# Patient Record
Sex: Male | Born: 1935 | Race: White | Hispanic: No | Smoking: Former smoker
Health system: Southern US, Community
[De-identification: ages and names within clinical notes are randomized; demographics above are authoritative.]

## PROBLEM LIST (undated history)

## (undated) DIAGNOSIS — I739 Peripheral vascular disease, unspecified: Secondary | ICD-10-CM

## (undated) DIAGNOSIS — M1990 Unspecified osteoarthritis, unspecified site: Secondary | ICD-10-CM

## (undated) DIAGNOSIS — I10 Essential (primary) hypertension: Secondary | ICD-10-CM

## (undated) DIAGNOSIS — E875 Hyperkalemia: Secondary | ICD-10-CM

## (undated) DIAGNOSIS — E785 Hyperlipidemia, unspecified: Secondary | ICD-10-CM

## (undated) DIAGNOSIS — K219 Gastro-esophageal reflux disease without esophagitis: Secondary | ICD-10-CM

## (undated) DIAGNOSIS — I779 Disorder of arteries and arterioles, unspecified: Secondary | ICD-10-CM

## (undated) DIAGNOSIS — I252 Old myocardial infarction: Secondary | ICD-10-CM

## (undated) DIAGNOSIS — I2510 Atherosclerotic heart disease of native coronary artery without angina pectoris: Secondary | ICD-10-CM

## (undated) HISTORY — DX: Hyperkalemia: E87.5

## (undated) HISTORY — DX: Disorder of arteries and arterioles, unspecified: I77.9

## (undated) HISTORY — DX: Essential (primary) hypertension: I10

## (undated) HISTORY — DX: Hyperlipidemia, unspecified: E78.5

## (undated) HISTORY — DX: Unspecified osteoarthritis, unspecified site: M19.90

## (undated) HISTORY — DX: Old myocardial infarction: I25.2

## (undated) HISTORY — DX: Peripheral vascular disease, unspecified: I73.9

## (undated) HISTORY — DX: Atherosclerotic heart disease of native coronary artery without angina pectoris: I25.10

---

## 2003-07-31 DIAGNOSIS — I252 Old myocardial infarction: Secondary | ICD-10-CM

## 2003-07-31 DIAGNOSIS — I2510 Atherosclerotic heart disease of native coronary artery without angina pectoris: Secondary | ICD-10-CM

## 2003-07-31 HISTORY — DX: Atherosclerotic heart disease of native coronary artery without angina pectoris: I25.10

## 2003-07-31 HISTORY — DX: Old myocardial infarction: I25.2

## 2003-07-31 HISTORY — PX: CORONARY ANGIOPLASTY WITH STENT PLACEMENT: SHX49

## 2004-03-19 ENCOUNTER — Other Ambulatory Visit: Payer: Self-pay

## 2005-06-28 ENCOUNTER — Ambulatory Visit: Payer: Self-pay | Admitting: Unknown Physician Specialty

## 2011-07-05 ENCOUNTER — Ambulatory Visit: Payer: Self-pay | Admitting: General Surgery

## 2012-04-18 DIAGNOSIS — C439 Malignant melanoma of skin, unspecified: Secondary | ICD-10-CM | POA: Insufficient documentation

## 2012-04-18 DIAGNOSIS — Z955 Presence of coronary angioplasty implant and graft: Secondary | ICD-10-CM | POA: Insufficient documentation

## 2012-04-18 DIAGNOSIS — I2510 Atherosclerotic heart disease of native coronary artery without angina pectoris: Secondary | ICD-10-CM | POA: Insufficient documentation

## 2012-04-18 DIAGNOSIS — Z9861 Coronary angioplasty status: Secondary | ICD-10-CM | POA: Insufficient documentation

## 2013-05-08 ENCOUNTER — Encounter: Payer: Self-pay | Admitting: Podiatry

## 2013-05-15 ENCOUNTER — Encounter: Payer: Self-pay | Admitting: Podiatry

## 2013-05-15 ENCOUNTER — Ambulatory Visit (INDEPENDENT_AMBULATORY_CARE_PROVIDER_SITE_OTHER): Payer: Medicare Other | Admitting: Podiatry

## 2013-05-15 ENCOUNTER — Ambulatory Visit (INDEPENDENT_AMBULATORY_CARE_PROVIDER_SITE_OTHER): Payer: Medicare Other

## 2013-05-15 VITALS — BP 147/81 | HR 57 | Resp 16 | Ht 72.0 in | Wt 204.0 lb

## 2013-05-15 DIAGNOSIS — R52 Pain, unspecified: Secondary | ICD-10-CM

## 2013-05-15 DIAGNOSIS — M2040 Other hammer toe(s) (acquired), unspecified foot: Secondary | ICD-10-CM

## 2013-05-15 DIAGNOSIS — G589 Mononeuropathy, unspecified: Secondary | ICD-10-CM

## 2013-05-15 DIAGNOSIS — M779 Enthesopathy, unspecified: Secondary | ICD-10-CM

## 2013-05-15 DIAGNOSIS — M2010 Hallux valgus (acquired), unspecified foot: Secondary | ICD-10-CM

## 2013-05-15 MED ORDER — GABAPENTIN 400 MG PO CAPS: 400.0000 mg | ORAL_CAPSULE | Freq: Two times a day (BID) | ORAL | Status: DC

## 2013-05-15 NOTE — Patient Instructions (Signed)
Take one at night for 2 weeks then start 2 a day if you are tolerating well

## 2013-05-15 NOTE — Progress Notes (Signed)
Subjective:     Patient ID: Mark [name], male   DOB: [birth year], 80 y.o.   MRN: [id number]  Foot Pain   patient presents stating I have very bad feet with arthritis bunions and hammertoes and they're starting to feel numb and burning. States that this is been going on for several years and gradually becoming more bothersome  Review of Systems  All other systems reviewed and are negative.       Objective:   Physical Exam  Nursing note and vitals reviewed. Constitutional: He appears well-developed and well-nourished.  Cardiovascular: Intact distal pulses.   Musculoskeletal: Normal range of motion.  Skin: Skin is warm.   systems review  reviewed with patient. Significant forefoot structural malalignment with elevated toes left over right and big toes that fall underneath the second toe. 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 We discussed increased padding for the plantar foot and the possibility for long-term orthotics but at this point I want to control the burning and tingling that he is experiencing. I placed him on Neurontin 400 mg once a day for 2 weeks and if tolerated well twice a day for 2 week if it bothers him he will stop it immediately. Reappoint 4 weeks

## 2013-05-15 NOTE — Progress Notes (Signed)
N HURT/SWELL L B/L FEET/ANKLES D 25-30 YRS O SLOWLY C WORSE A WALKING T SEEN DR AT [facility] WAS TOLD TO TAKE TYLENOL (70YRS AGO)

## 2013-07-03 ENCOUNTER — Ambulatory Visit (INDEPENDENT_AMBULATORY_CARE_PROVIDER_SITE_OTHER): Payer: Medicare Other | Admitting: Podiatry

## 2013-07-03 ENCOUNTER — Ambulatory Visit: Payer: Medicare Other | Admitting: Podiatry

## 2013-07-03 ENCOUNTER — Encounter: Payer: Self-pay | Admitting: Podiatry

## 2013-07-03 VITALS — BP 120/66 | HR 63 | Resp 16 | Ht 72.0 in | Wt 201.0 lb

## 2013-07-03 DIAGNOSIS — M7750 Other enthesopathy of unspecified foot: Secondary | ICD-10-CM

## 2013-07-03 DIAGNOSIS — G589 Mononeuropathy, unspecified: Secondary | ICD-10-CM

## 2013-07-04 NOTE — Progress Notes (Signed)
Subjective:     Patient ID: Mark [name], male   DOB: 1929-06-08, 79 y.o.   MRN: [id number]  HPI patient states the medicine has been beneficial but I think it has caused me to get slightly dizzy at the dosage that I am taking. Still having pain in my feet when doing a lot of walking   Review of Systems     Objective:   Physical Exam Neurovascular status is intact with no other changes in health history noted. Severe structural forefoot derangement with neuropathic changes distal with diminishment of sharp testing and vibratory sensation    Assessment:     Structural damage to forefoot along with probable neuropathic condition    Plan:     Discussed reducing medication and he will only take one Neurontin at night and see whether or not this is beneficial. We may put him on a lower dosage during the day which I will decide in 2 months and also will consider orthotics for the long-term

## 2013-09-04 ENCOUNTER — Ambulatory Visit: Payer: Medicare Other | Admitting: Podiatry

## 2013-09-18 ENCOUNTER — Ambulatory Visit: Payer: Self-pay | Admitting: Family Medicine

## 2013-12-25 ENCOUNTER — Ambulatory Visit: Payer: Self-pay | Admitting: Family Medicine

## 2014-02-03 ENCOUNTER — Ambulatory Visit: Payer: Self-pay | Admitting: Orthopedic Surgery

## 2014-03-03 DIAGNOSIS — M48062 Spinal stenosis, lumbar region with neurogenic claudication: Secondary | ICD-10-CM | POA: Insufficient documentation

## 2014-03-03 DIAGNOSIS — M5136 Other intervertebral disc degeneration, lumbar region: Secondary | ICD-10-CM | POA: Insufficient documentation

## 2014-03-03 DIAGNOSIS — M5416 Radiculopathy, lumbar region: Secondary | ICD-10-CM | POA: Insufficient documentation

## 2014-03-03 DIAGNOSIS — M5116 Intervertebral disc disorders with radiculopathy, lumbar region: Secondary | ICD-10-CM | POA: Insufficient documentation

## 2014-03-18 ENCOUNTER — Ambulatory Visit: Payer: Self-pay | Admitting: Urology

## 2014-07-16 ENCOUNTER — Ambulatory Visit: Payer: Self-pay | Admitting: Family Medicine

## 2014-07-16 LAB — CLOSTRIDIUM DIFFICILE(ARMC)

## 2015-02-03 ENCOUNTER — Ambulatory Visit (INDEPENDENT_AMBULATORY_CARE_PROVIDER_SITE_OTHER): Payer: PPO | Admitting: Family Medicine

## 2015-02-03 ENCOUNTER — Encounter: Payer: Self-pay | Admitting: Family Medicine

## 2015-02-03 VITALS — BP 120/76 | HR 71 | Wt 194.0 lb

## 2015-02-03 DIAGNOSIS — M1990 Unspecified osteoarthritis, unspecified site: Secondary | ICD-10-CM | POA: Insufficient documentation

## 2015-02-03 DIAGNOSIS — R195 Other fecal abnormalities: Secondary | ICD-10-CM | POA: Insufficient documentation

## 2015-02-03 DIAGNOSIS — K921 Melena: Secondary | ICD-10-CM | POA: Insufficient documentation

## 2015-02-03 DIAGNOSIS — K5790 Diverticulosis of intestine, part unspecified, without perforation or abscess without bleeding: Secondary | ICD-10-CM | POA: Insufficient documentation

## 2015-02-03 MED ORDER — DICLOFENAC POTASSIUM 50 MG PO TABS: 50.0000 mg | ORAL_TABLET | Freq: Two times a day (BID) | ORAL | Status: DC

## 2015-02-03 NOTE — Progress Notes (Signed)
Name: Mark [name]   MRN: [id number]    DOB: Aug 01, 1928   Date:02/03/2015       Progress Note  Subjective  Chief Complaint  Chief Complaint  Patient presents with  . Arthritis     f/u    Arthritis Presents for follow-up visit. The disease course has been stable. He complains of pain. Affected locations include the right foot, right ankle, left ankle and left foot. His pain is at a severity of 5/10. Associated symptoms include diarrhea and pain at night. His past medical history is significant for osteoarthritis. Past treatments include acetaminophen and NSAIDs. The treatment provided moderate relief. Compliance with prior treatments has been good.  Black-colored Stools Patient has noticed black colored stools for the past few days. He has had watery diarrhea, nausea, and anorexia and has been taking Pepto-Bismol for the last 4-5 days.    Past Medical History  Diagnosis Date  . Arthritis   . Hypertension   . Past heart attack 2005    Past Surgical History  Procedure Laterality Date  . Coronary angioplasty with stent placement      Family History  Problem Relation Age of Onset  . Hypertension Mother   . Heart disease Mother   . Heart disease Father   . Hypertension Father     History   Social History  . Marital Status: Married    Spouse Name: N/A  . Number of Children: N/A  . Years of Education: N/A   Occupational History  . Not on file.   Social History Main Topics  . Smoking status: Former Smoker    Quit date: 05/16/1963  . Smokeless tobacco: Never Used     Comment: quit over 50 years ago  . Alcohol Use: No  . Drug Use: No  . Sexual Activity: Not on file   Other Topics Concern  . Not on file   Social History Narrative     Current outpatient prescriptions:  .  albuterol (PROVENTIL HFA) 108 (90 BASE) MCG/ACT inhaler, 1 inhalation every 4 (four) hours as needed. , Disp: , Rfl:  .  aspirin 81 MG tablet, Take 81 mg by mouth daily., Disp: , Rfl:  .   diclofenac (CATAFLAM) 50 MG tablet, Take 1 tablet (50 mg total) by mouth 2 (two) times daily., Disp: 60 tablet, Rfl: 2 .  esomeprazole (NEXIUM) 40 MG packet, Take 40 mg by mouth daily before breakfast., Disp: , Rfl:  .  fluticasone (FLONASE) 50 MCG/ACT nasal spray, Place into the nose., Disp: , Rfl:  .  losartan (COZAAR) 50 MG tablet, Take 50 mg by mouth., Disp: , Rfl:  .  metoprolol (LOPRESSOR) 50 MG tablet, Take 50 mg by mouth 2 (two) times daily., Disp: , Rfl:  .  Probiotic Product (PROBIOTIC PO), Take by mouth daily., Disp: , Rfl:  .  acetaminophen (TYLENOL) 500 MG tablet, Take 500 mg by mouth 2 (two) times daily., Disp: , Rfl:  .  aspirin EC 81 MG tablet, Take 81 mg by mouth., Disp: , Rfl:  .  gabapentin (NEURONTIN) 400 MG capsule, Take 1 capsule (400 mg total) by mouth 2 (two) times daily. (Patient not taking: Reported on 02/03/2015), Disp: 60 capsule, Rfl: 3 .  Loratadine 10 MG CAPS, Take by mouth., Disp: , Rfl:  .  Omega-3 Fatty Acids (FISH OIL) 1000 MG CAPS, Take by mouth daily., Disp: , Rfl:   No Known Allergies   Review of Systems  Gastrointestinal: Positive for diarrhea.  Musculoskeletal:  Positive for back pain, joint pain and arthritis.      Objective  Filed Vitals:   02/03/15 1444  BP: 120/76  Pulse: 71  Weight: 194 lb (87.998 kg)  SpO2: 96%    Physical Exam  Constitutional: He is well-developed, well-nourished, and in no distress.  HENT:  Head: Normocephalic and atraumatic.  Cardiovascular: Normal rate and regular rhythm.   Pulmonary/Chest: Effort normal.  Nursing note and vitals reviewed.    Assessment & Plan  1. Arthritis Patient has been taken off Misoprostol by gastroenterology. Misoprostol was prescribed for GI prophylaxis due to NSAID therapy. Symptoms of arthritis are well-controlled on diclofenac 50 mg twice a day. He has apparently been prescribed Tylenol No. 3 by a different provider. He will call our office regarding the dosage instructions for  Tylenol 3.  - diclofenac (CATAFLAM) 50 MG tablet; Take 1 tablet (50 mg total) by mouth 2 (two) times daily.  Dispense: 60 tablet; Refill: 2  2. Dark stools Patient has been asked to contact his gastroenterologist Dr. Vira Agar to discuss his symptoms of black colored stools. DDX includes UGIB versus medication side effect vs idiopathic. Patient verbalized understanding.  Analya Louissaint Asad A. [facility] 02/03/2015 3:31 PM

## 2015-02-04 ENCOUNTER — Telehealth: Payer: Self-pay | Admitting: Family Medicine

## 2015-02-04 NOTE — Telephone Encounter (Signed)
Pt called in to let provider know that his Gabepentin 400mg  was prescribed by Dr. Paulla Dolly.

## 2015-02-09 ENCOUNTER — Ambulatory Visit: Payer: Self-pay | Admitting: Family Medicine

## 2015-03-29 ENCOUNTER — Encounter: Payer: Self-pay | Admitting: Family Medicine

## 2015-03-29 ENCOUNTER — Ambulatory Visit (INDEPENDENT_AMBULATORY_CARE_PROVIDER_SITE_OTHER): Payer: PPO | Admitting: Family Medicine

## 2015-03-29 VITALS — BP 120/75 | HR 84 | Temp 97.8°F | Resp 19 | Ht 69.0 in | Wt 197.9 lb

## 2015-03-29 DIAGNOSIS — E785 Hyperlipidemia, unspecified: Secondary | ICD-10-CM | POA: Diagnosis not present

## 2015-03-29 DIAGNOSIS — Z833 Family history of diabetes mellitus: Secondary | ICD-10-CM | POA: Diagnosis not present

## 2015-03-29 DIAGNOSIS — Z23 Encounter for immunization: Secondary | ICD-10-CM

## 2015-03-29 DIAGNOSIS — E875 Hyperkalemia: Secondary | ICD-10-CM | POA: Diagnosis not present

## 2015-03-29 DIAGNOSIS — M1990 Unspecified osteoarthritis, unspecified site: Secondary | ICD-10-CM | POA: Diagnosis not present

## 2015-03-29 MED ORDER — MELOXICAM 7.5 MG PO TABS
7.5000 mg | ORAL_TABLET | Freq: Every day | ORAL | Status: DC
Start: 2015-03-29 — End: 2015-05-30

## 2015-03-29 NOTE — Progress Notes (Signed)
Name: Mark [name]   MRN: [id number]    DOB: 1929-06-07   Date:03/29/2015       Progress Note  Subjective  Chief Complaint  Chief Complaint  Patient presents with  . Follow-up    6 wk. lab work    Hyperlipidemia This is a chronic problem. The problem is controlled. Pertinent negatives include no myalgias. He is currently on no antihyperlipidemic treatment (Has been on Crestor in the past.).  Arthritis Presents for follow-up visit. He complains of pain, stiffness and joint swelling. Affected locations include the right hip, left foot and right foot. His past medical history is significant for chronic back pain and osteoarthritis. Past treatments include NSAIDs (Pt. has been taking Diclofenac and wants to switch to Meloxicam for arthritis.).    Past Medical History  Diagnosis Date  . Arthritis   . Hypertension   . Past heart attack 2005    Past Surgical History  Procedure Laterality Date  . Coronary angioplasty with stent placement      Family History  Problem Relation Age of Onset  . Hypertension Mother   . Heart disease Mother   . Heart disease Father   . Hypertension Father     Social History   Social History  . Marital Status: Married    Spouse Name: N/A  . Number of Children: N/A  . Years of Education: N/A   Occupational History  . Not on file.   Social History Main Topics  . Smoking status: Former Smoker    Quit date: 05/16/1963  . Smokeless tobacco: Never Used     Comment: quit over 50 years ago  . Alcohol Use: No  . Drug Use: No  . Sexual Activity: Not on file   Other Topics Concern  . Not on file   Social History Narrative     Current outpatient prescriptions:  .  acetaminophen (TYLENOL) 500 MG tablet, Take 500 mg by mouth 2 (two) times daily., Disp: , Rfl:  .  albuterol (PROVENTIL HFA) 108 (90 BASE) MCG/ACT inhaler, 1 inhalation every 4 (four) hours as needed. , Disp: , Rfl:  .  aspirin EC 81 MG tablet, Take 81 mg by mouth., Disp: , Rfl:   .  diclofenac (CATAFLAM) 50 MG tablet, Take 1 tablet (50 mg total) by mouth 2 (two) times daily., Disp: 60 tablet, Rfl: 2 .  esomeprazole (NEXIUM) 40 MG packet, Take 40 mg by mouth daily before breakfast., Disp: , Rfl:  .  fluticasone (FLONASE) 50 MCG/ACT nasal spray, Place into the nose., Disp: , Rfl:  .  gabapentin (NEURONTIN) 400 MG capsule, Take 1 capsule (400 mg total) by mouth 2 (two) times daily., Disp: 60 capsule, Rfl: 3 .  Loratadine 10 MG CAPS, Take by mouth., Disp: , Rfl:  .  losartan (COZAAR) 50 MG tablet, Take 50 mg by mouth., Disp: , Rfl:  .  metoprolol (LOPRESSOR) 50 MG tablet, Take 50 mg by mouth 2 (two) times daily., Disp: , Rfl:  .  Probiotic Product (PROBIOTIC PO), Take by mouth daily., Disp: , Rfl:   Allergies  Allergen Reactions  . Rosuvastatin Rash    No reaction per pt, He saw some information regarding Crestor and adverse effects No reaction per pt, He saw some information regarding Crestor and adverse effects     Review of Systems  Musculoskeletal: Positive for back pain, joint pain, joint swelling, arthritis and stiffness. Negative for myalgias.      Objective  Filed Vitals:  03/29/15 1010  BP: 120/75  Pulse: 84  Temp: 97.8 F (36.6 C)  TempSrc: Oral  Resp: 19  Height: 5\' 9"  (1.753 m)  Weight: 197 lb 14.4 oz (89.767 kg)  SpO2: 95%    Physical Exam  Constitutional: He is oriented to person, place, and time and well-developed, well-nourished, and in no distress.  HENT:  Head: Normocephalic and atraumatic.  Cardiovascular: Normal rate.   Murmur heard. Pulmonary/Chest: Effort normal and breath sounds normal.  Musculoskeletal:       Right ankle: He exhibits swelling.       Left ankle: He exhibits swelling.  Neurological: He is alert and oriented to person, place, and time.  Nursing note and vitals reviewed.   Assessment & Plan  1. Hyperlipidemia  - Lipid Profile - Comprehensive metabolic panel  2. Family history of type 2 diabetes  mellitus in father  - HgB A1c  3. Arthritis  DC diclofenac and start meloxicam 7.5 mg daily. Patient asked to continue taking misoprostol for GI protection. Follow-up in one month. - meloxicam (MOBIC) 7.5 MG tablet; Take 1 tablet (7.5 mg total) by mouth daily.  Dispense: 30 tablet; Refill: 0     Akayla Brass Asad A. [facility] 03/29/2015 10:38 AM

## 2015-03-30 LAB — LIPID PANEL
CHOLESTEROL TOTAL: 212 mg/dL — AB (ref 100–199)
Chol/HDL Ratio: 6.6 ratio units — ABNORMAL HIGH (ref 0.0–5.0)
HDL: 32 mg/dL — ABNORMAL LOW (ref >39)
LDL Calculated: 158 mg/dL — ABNORMAL HIGH (ref 0–99)
Triglycerides: 108 mg/dL (ref 0–149)
VLDL CHOLESTEROL CAL: 22 mg/dL (ref 5–40)

## 2015-03-30 LAB — COMPREHENSIVE METABOLIC PANEL
ALBUMIN: 4 g/dL (ref 3.5–4.7)
ALK PHOS: 91 IU/L (ref 39–117)
ALT: 13 IU/L (ref 0–44)
AST: 18 IU/L (ref 0–40)
Albumin/Globulin Ratio: 1.5 (ref 1.1–2.5)
BUN / CREAT RATIO: 16 (ref 10–22)
BUN: 15 mg/dL (ref 8–27)
Bilirubin Total: 0.5 mg/dL (ref 0.0–1.2)
CALCIUM: 9.1 mg/dL (ref 8.6–10.2)
CO2: 21 mmol/L (ref 18–29)
CREATININE: 0.93 mg/dL (ref 0.76–1.27)
Chloride: 103 mmol/L (ref 97–108)
GFR calc Af Amer: 87 mL/min/{1.73_m2} (ref >59)
GFR, EST NON AFRICAN AMERICAN: 75 mL/min/{1.73_m2} (ref >59)
GLOBULIN, TOTAL: 2.7 g/dL (ref 1.5–4.5)
GLUCOSE: 96 mg/dL (ref 65–99)
Potassium: 5.5 mmol/L — ABNORMAL HIGH (ref 3.5–5.2)
SODIUM: 141 mmol/L (ref 134–144)
TOTAL PROTEIN: 6.7 g/dL (ref 6.0–8.5)

## 2015-03-30 LAB — HEMOGLOBIN A1C
ESTIMATED AVERAGE GLUCOSE: 131 mg/dL
HEMOGLOBIN A1C: 6.2 % — AB (ref 4.8–5.6)

## 2015-04-01 DIAGNOSIS — E875 Hyperkalemia: Secondary | ICD-10-CM | POA: Insufficient documentation

## 2015-04-01 NOTE — Addendum Note (Signed)
Addended byManuella Ghazi, Nakul Avino A A on: 04/01/2015 05:54 PM   Modules accepted: Orders

## 2015-04-29 ENCOUNTER — Encounter: Payer: Self-pay | Admitting: Family Medicine

## 2015-04-29 ENCOUNTER — Ambulatory Visit (INDEPENDENT_AMBULATORY_CARE_PROVIDER_SITE_OTHER): Payer: PPO | Admitting: Family Medicine

## 2015-04-29 VITALS — BP 120/74 | HR 83 | Temp 97.6°F | Resp 18 | Ht 69.0 in | Wt 196.1 lb

## 2015-04-29 DIAGNOSIS — M7989 Other specified soft tissue disorders: Secondary | ICD-10-CM | POA: Diagnosis not present

## 2015-04-29 DIAGNOSIS — E785 Hyperlipidemia, unspecified: Secondary | ICD-10-CM | POA: Diagnosis not present

## 2015-04-29 MED ORDER — FUROSEMIDE 20 MG PO TABS: 20.0000 mg | ORAL_TABLET | Freq: Every day | ORAL | Status: DC

## 2015-04-29 MED ORDER — ROSUVASTATIN CALCIUM 5 MG PO TABS: 5.0000 mg | ORAL_TABLET | Freq: Every day | ORAL | Status: DC

## 2015-04-29 NOTE — Progress Notes (Signed)
Name: Mark [name]   MRN: [id number]    DOB: 1929-01-13   Date:04/29/2015       Progress Note  Subjective  Chief Complaint  Chief Complaint  Patient presents with  . Follow-up    1 mo  . Hyperlipidemia    Hyperlipidemia This is a chronic problem. The problem is controlled. Recent lipid tests were reviewed and are high. Pertinent negatives include no chest pain, leg pain or shortness of breath. He is currently on no antihyperlipidemic treatment (Patient has been on Crestor in the past).   Leg Swelling Patient presents with persistent right-sided leg swelling.no fevers, chills, or shortness of breath reported  Past Medical History  Diagnosis Date  . Arthritis   . Hypertension   . Past heart attack 2005    Past Surgical History  Procedure Laterality Date  . Coronary angioplasty with stent placement      Family History  Problem Relation Age of Onset  . Hypertension Mother   . Heart disease Mother   . Heart disease Father   . Hypertension Father     Social History   Social History  . Marital Status: Married    Spouse Name: N/A  . Number of Children: N/A  . Years of Education: N/A   Occupational History  . Not on file.   Social History Main Topics  . Smoking status: Former Smoker    Quit date: 05/16/1963  . Smokeless tobacco: Never Used     Comment: quit over 50 years ago  . Alcohol Use: No  . Drug Use: No  . Sexual Activity: Not on file   Other Topics Concern  . Not on file   Social History Narrative    Current outpatient prescriptions:  .  albuterol (PROVENTIL HFA) 108 (90 BASE) MCG/ACT inhaler, 1 inhalation every 4 (four) hours as needed. , Disp: , Rfl:  .  aspirin EC 81 MG tablet, Take 81 mg by mouth., Disp: , Rfl:  .  diclofenac (VOLTAREN) 50 MG EC tablet, Take 50 mg by mouth 2 (two) times daily., Disp: , Rfl:  .  esomeprazole (NEXIUM) 40 MG packet, Take 40 mg by mouth daily before breakfast., Disp: , Rfl:  .  fluticasone (FLONASE) 50 MCG/ACT  nasal spray, Place into the nose., Disp: , Rfl:  .  gabapentin (NEURONTIN) 400 MG capsule, Take 1 capsule (400 mg total) by mouth 2 (two) times daily., Disp: 60 capsule, Rfl: 3 .  losartan (COZAAR) 50 MG tablet, Take 50 mg by mouth., Disp: , Rfl:  .  metoprolol (LOPRESSOR) 50 MG tablet, Take 50 mg by mouth 2 (two) times daily., Disp: , Rfl:  .  Probiotic Product (PROBIOTIC PO), Take by mouth daily., Disp: , Rfl:  .  acetaminophen (TYLENOL) 500 MG tablet, Take 500 mg by mouth 2 (two) times daily., Disp: , Rfl:  .  Loratadine 10 MG CAPS, Take by mouth., Disp: , Rfl:  .  meloxicam (MOBIC) 7.5 MG tablet, Take 1 tablet (7.5 mg total) by mouth daily. (Patient not taking: Reported on 04/29/2015), Disp: 30 tablet, Rfl: 0  Allergies  Allergen Reactions  . Rosuvastatin Rash and Other (See Comments)    No reaction per pt, He saw some information regarding Crestor and adverse effects No reaction per pt, He saw some information regarding Crestor and adverse effects No reaction per pt, He saw some information regarding Crestor and adverse effects    Review of Systems  Constitutional: Negative for fever and chills.  Respiratory:  Negative for shortness of breath.   Cardiovascular: Positive for leg swelling. Negative for chest pain.   Objective  Filed Vitals:   04/29/15 1110  BP: 120/74  Pulse: 83  Temp: 97.6 F (36.4 C)  TempSrc: Oral  Resp: 18  Height: 5\' 9"  (1.753 m)  Weight: 196 lb 1.6 oz (88.95 kg)  SpO2: 96%    Physical Exam  Constitutional: He is well-developed, well-nourished, and in no distress.  HENT:  Head: Normocephalic and atraumatic.  Cardiovascular: Normal rate and regular rhythm.   Pulmonary/Chest: Effort normal and breath sounds normal.  Musculoskeletal:       Right ankle: He exhibits swelling.       Left ankle: He exhibits swelling.  R > L lower extremity swelling  Nursing note and vitals reviewed.  Assessment & Plan  1. Leg swelling  Started patient on Lasix 20 mg  3 for lower extremity swelling. - furosemide (LASIX) 20 MG tablet; Take 1 tablet (20 mg total) by mouth daily.  Dispense: 30 tablet; Refill: 3  2. Hyperlipidemia Started patient on Crestor 5 mg at bedtime for hyperlipidemia. Recheck FLP in 3 months. - rosuvastatin (CRESTOR) 5 MG tablet; Take 1 tablet (5 mg total) by mouth daily.  Dispense: 90 tablet; Refill: 3   Syed Asad A. [facility] 04/29/2015 11:47 AM

## 2015-05-30 ENCOUNTER — Encounter: Payer: Self-pay | Admitting: Family Medicine

## 2015-05-30 ENCOUNTER — Ambulatory Visit (INDEPENDENT_AMBULATORY_CARE_PROVIDER_SITE_OTHER): Payer: PPO | Admitting: Family Medicine

## 2015-05-30 VITALS — BP 124/68 | HR 70 | Temp 97.9°F | Resp 16 | Ht 69.0 in | Wt 199.7 lb

## 2015-05-30 DIAGNOSIS — E875 Hyperkalemia: Secondary | ICD-10-CM

## 2015-05-30 DIAGNOSIS — M7989 Other specified soft tissue disorders: Secondary | ICD-10-CM | POA: Insufficient documentation

## 2015-05-30 DIAGNOSIS — I890 Lymphedema, not elsewhere classified: Secondary | ICD-10-CM | POA: Insufficient documentation

## 2015-05-30 MED ORDER — FUROSEMIDE 20 MG PO TABS: 20.0000 mg | ORAL_TABLET | Freq: Two times a day (BID) | ORAL | Status: DC

## 2015-05-30 NOTE — Progress Notes (Signed)
Name: Mark [name]   MRN: [id number]    DOB: [birth year]   Date:05/30/2015       Progress Note  Subjective  Chief Complaint  Chief Complaint  Patient presents with  . Hypertension    1 month recheck  . Hyperlipidemia    HPI  Leg Swelling Pt. Is here for bilateral leg swelling, started on Lasix 20 mg daily at last office visit. He reports swelling is still present but is improved. No shortness of breath, chest pain or other concerning symptoms.  Past Medical History  Diagnosis Date  . Arthritis   . Hypertension   . Past heart attack 2005    Past Surgical History  Procedure Laterality Date  . Coronary angioplasty with stent placement      Family History  Problem Relation Age of Onset  . Hypertension Mother   . Heart disease Mother   . Heart disease Father   . Hypertension Father     Social History   Social History  . Marital Status: Married    Spouse Name: N/A  . Number of Children: N/A  . Years of Education: N/A   Occupational History  . Not on file.   Social History Main Topics  . Smoking status: Former Smoker    Quit date: 05/16/1963  . Smokeless tobacco: Never Used     Comment: quit over 50 years ago  . Alcohol Use: No  . Drug Use: No  . Sexual Activity: Not on file   Other Topics Concern  . Not on file   Social History Narrative     Current outpatient prescriptions:  .  acetaminophen (TYLENOL) 500 MG tablet, Take 500 mg by mouth 2 (two) times daily., Disp: , Rfl:  .  albuterol (PROVENTIL HFA) 108 (90 BASE) MCG/ACT inhaler, 1 inhalation every 4 (four) hours as needed. , Disp: , Rfl:  .  aspirin EC 81 MG tablet, Take 81 mg by mouth., Disp: , Rfl:  .  diclofenac (VOLTAREN) 50 MG EC tablet, Take 50 mg by mouth 2 (two) times daily., Disp: , Rfl:  .  esomeprazole (NEXIUM) 40 MG packet, Take 40 mg by mouth daily before breakfast., Disp: , Rfl:  .  fluticasone (FLONASE) 50 MCG/ACT nasal spray, Place into the nose., Disp: , Rfl:  .  furosemide  (LASIX) 20 MG tablet, Take 1 tablet (20 mg total) by mouth daily., Disp: 30 tablet, Rfl: 3 .  gabapentin (NEURONTIN) 400 MG capsule, Take 1 capsule (400 mg total) by mouth 2 (two) times daily., Disp: 60 capsule, Rfl: 3 .  Loratadine 10 MG CAPS, Take by mouth., Disp: , Rfl:  .  losartan (COZAAR) 50 MG tablet, Take 50 mg by mouth., Disp: , Rfl:  .  metoprolol (LOPRESSOR) 50 MG tablet, Take 50 mg by mouth 2 (two) times daily., Disp: , Rfl:  .  Probiotic Product (PROBIOTIC PO), Take by mouth daily., Disp: , Rfl:  .  rosuvastatin (CRESTOR) 5 MG tablet, Take 1 tablet (5 mg total) by mouth daily., Disp: 90 tablet, Rfl: 3  Allergies  Allergen Reactions  . Rosuvastatin Rash and Other (See Comments)    No reaction per pt, He saw some information regarding Crestor and adverse effects No reaction per pt, He saw some information regarding Crestor and adverse effects No reaction per pt, He saw some information regarding Crestor and adverse effects    Review of Systems  Constitutional: Negative for fever and chills.  Respiratory: Negative for cough, sputum production and  shortness of breath.   Cardiovascular: Positive for leg swelling. Negative for chest pain.  Gastrointestinal: Negative for nausea, vomiting and abdominal pain.    Objective  Filed Vitals:   05/30/15 1013  BP: 124/68  Pulse: 70  Temp: 97.9 F (36.6 C)  TempSrc: Oral  Resp: 16  Height: 5\' 9"  (1.753 m)  Weight: 199 lb 11.2 oz (90.583 kg)  SpO2: 97%    Physical Exam  Constitutional: He is oriented to person, place, and time and well-developed, well-nourished, and in no distress.  HENT:  Head: Normocephalic and atraumatic.  Cardiovascular: Normal rate, regular rhythm and normal heart sounds.   Pulmonary/Chest: Effort normal and breath sounds normal. He has no wheezes. He has no rales.  Abdominal: Soft. Bowel sounds are normal.  Musculoskeletal: Normal range of motion. He exhibits edema (1 + pitting edema lower extremities.).   Neurological: He is alert and oriented to person, place, and time.  Skin: Skin is warm and dry.  Nursing note and vitals reviewed.  Assessment & Plan  1. Hyperkalemia  - Comprehensive Metabolic Panel (CMET)  2. Leg swelling We will increase Lasix to twice a day. Encouraged to elevate his legs. Follow-up in one month. - furosemide (LASIX) 20 MG tablet; Take 1 tablet (20 mg total) by mouth 2 (two) times daily.  Dispense: 60 tablet; Refill: 2   Carlisle Enke Asad A. [facility] 05/30/2015 10:33 AM

## 2015-05-31 LAB — COMPREHENSIVE METABOLIC PANEL
ALK PHOS: 90 IU/L (ref 39–117)
ALT: 10 IU/L (ref 0–44)
AST: 16 IU/L (ref 0–40)
Albumin/Globulin Ratio: 1.5 (ref 1.1–2.5)
Albumin: 3.9 g/dL (ref 3.5–4.7)
BUN/Creatinine Ratio: 16 (ref 10–22)
BUN: 16 mg/dL (ref 8–27)
Bilirubin Total: 0.3 mg/dL (ref 0.0–1.2)
CO2: 27 mmol/L (ref 18–29)
CREATININE: 1 mg/dL (ref 0.76–1.27)
Calcium: 9 mg/dL (ref 8.6–10.2)
Chloride: 101 mmol/L (ref 97–106)
GFR calc Af Amer: 80 mL/min/{1.73_m2} (ref >59)
GFR calc non Af Amer: 69 mL/min/{1.73_m2} (ref >59)
GLOBULIN, TOTAL: 2.6 g/dL (ref 1.5–4.5)
Glucose: 96 mg/dL (ref 65–99)
POTASSIUM: 5.5 mmol/L — AB (ref 3.5–5.2)
Sodium: 139 mmol/L (ref 136–144)
Total Protein: 6.5 g/dL (ref 6.0–8.5)

## 2015-06-01 ENCOUNTER — Telehealth: Payer: Self-pay | Admitting: Emergency Medicine

## 2015-06-01 NOTE — Telephone Encounter (Signed)
Patient notified

## 2015-06-09 ENCOUNTER — Encounter: Payer: Self-pay | Admitting: General Surgery

## 2015-06-16 ENCOUNTER — Ambulatory Visit: Payer: PPO

## 2015-06-16 ENCOUNTER — Encounter: Payer: Self-pay | Admitting: General Surgery

## 2015-06-16 ENCOUNTER — Ambulatory Visit (INDEPENDENT_AMBULATORY_CARE_PROVIDER_SITE_OTHER): Payer: PPO | Admitting: General Surgery

## 2015-06-16 VITALS — BP 122/62 | HR 70 | Resp 14 | Ht 67.0 in | Wt 198.0 lb

## 2015-06-16 DIAGNOSIS — I6529 Occlusion and stenosis of unspecified carotid artery: Secondary | ICD-10-CM

## 2015-06-16 DIAGNOSIS — I6521 Occlusion and stenosis of right carotid artery: Secondary | ICD-10-CM

## 2015-06-16 NOTE — Patient Instructions (Addendum)
CTA of carotids scheduled at [facility] for 07-06-15 at 11:30 am. Please arrive at 11:15 am to check-in. Also, you will need to be on a liquid diet for 4 hours prior to test.   Follow to be announced based upon results.

## 2015-06-16 NOTE — Progress Notes (Signed)
Patient ID: Mark [name], male   DOB: [birth year], 80 y.o.   MRN: [id number]  Chief Complaint  Patient presents with  . Other    carotid ultrasound    HPI Mark [name] is a 81 y.o. male here today for a carotid ultrasound. He states he is doing well. Denies any dizziness or frequent headaches. He has known plaquing in right carotid artery-asymptomatic. Last seen in 2012. I have reviewed the history of present illness with the patient.  HPI  Past Medical History  Diagnosis Date  . Arthritis   . Hypertension   . Past heart attack 2005    Past Surgical History  Procedure Laterality Date  . Coronary angioplasty with stent placement      Family History  Problem Relation Age of Onset  . Hypertension Mother   . Heart disease Mother   . Heart disease Father   . Hypertension Father     Social History Social History  Substance Use Topics  . Smoking status: Former Smoker    Quit date: 05/16/1963  . Smokeless tobacco: Never Used     Comment: quit over 50 years ago  . Alcohol Use: No    No Known Allergies  Current Outpatient Prescriptions  Medication Sig Dispense Refill  . albuterol (PROVENTIL HFA) 108 (90 BASE) MCG/ACT inhaler 1 inhalation every 4 (four) hours as needed.     Marland Kitchen aspirin EC 81 MG tablet Take 81 mg by mouth.    . diclofenac (VOLTAREN) 50 MG EC tablet Take 50 mg by mouth 2 (two) times daily.    Marland Kitchen esomeprazole (NEXIUM) 40 MG packet Take 40 mg by mouth daily before breakfast.    . fluticasone (FLONASE) 50 MCG/ACT nasal spray Place into the nose as needed.     . furosemide (LASIX) 20 MG tablet Take 1 tablet (20 mg total) by mouth 2 (two) times daily. 60 tablet 2  . gabapentin (NEURONTIN) 400 MG capsule Take 1 capsule (400 mg total) by mouth 2 (two) times daily. 60 capsule 3  . Loratadine 10 MG CAPS Take by mouth.    . metoprolol (LOPRESSOR) 50 MG tablet Take 50 mg by mouth 2 (two) times daily.    . Probiotic Product (PROBIOTIC PO) Take by mouth daily.    .  rosuvastatin (CRESTOR) 5 MG tablet Take 1 tablet (5 mg total) by mouth daily. 90 tablet 3   No current facility-administered medications for this visit.    Review of Systems Review of Systems  Constitutional: Negative.   Respiratory: Negative.   Cardiovascular: Negative.   Neurological: Negative for dizziness and light-headedness.    Blood pressure 122/62, pulse 70, resp. rate 14, height 5\' 7"  (1.702 m), weight 198 lb (89.812 kg).  Physical Exam Physical Exam  Constitutional: He is oriented to person, place, and time. He appears well-developed and well-nourished.  Eyes: Conjunctivae are normal. No scleral icterus.  Neurological: He is alert and oriented to person, place, and time.  Skin: Skin is warm and dry.  Psychiatric: His behavior is normal.    Data Reviewed Progress notes reviewed Carotid ultrasound performed today showing moderate to severe stenosis right side Assessment    Carotid stenosis, significant change seen on right side since prior exam.     Plan    Schedule carotid CT angiogram, increase daily ASA to 325 mg daily. Follow up after imaging completed.  Patient has been scheduled for CTA carotids at [facility] for 07-06-15 at 11:30 am (arrive 11:15 am).  Prep: liquid diet for 4 hours prior to the test.     PCP:  Ledell Peoples G 06/16/2015, 11:28 AM

## 2015-06-20 ENCOUNTER — Telehealth: Payer: Self-pay | Admitting: Family Medicine

## 2015-06-20 DIAGNOSIS — G589 Mononeuropathy, unspecified: Secondary | ICD-10-CM

## 2015-06-20 MED ORDER — GABAPENTIN 400 MG PO CAPS: 400.0000 mg | ORAL_CAPSULE | Freq: Two times a day (BID) | ORAL | Status: DC

## 2015-06-20 MED ORDER — DICLOFENAC SODIUM 50 MG PO TBEC: 50.0000 mg | DELAYED_RELEASE_TABLET | Freq: Two times a day (BID) | ORAL | Status: DC

## 2015-06-20 MED ORDER — METOPROLOL TARTRATE 50 MG PO TABS: 50.0000 mg | ORAL_TABLET | Freq: Two times a day (BID) | ORAL | Status: DC

## 2015-06-20 NOTE — Telephone Encounter (Signed)
Medication has been refilled and sent to [facility]

## 2015-06-20 NOTE — Telephone Encounter (Signed)
Leaving for [place] in the morning and is requesting refills on Metoprolol, diclofenac, and gabepentin. Please send to [facility]

## 2015-06-30 ENCOUNTER — Ambulatory Visit: Payer: PPO | Admitting: Family Medicine

## 2015-07-06 ENCOUNTER — Encounter: Payer: Self-pay | Admitting: Family Medicine

## 2015-07-06 ENCOUNTER — Ambulatory Visit
Admission: RE | Admit: 2015-07-06 | Discharge: 2015-07-06 | Disposition: A | Discharge status: Home / Self Care | Payer: PPO | Source: Ambulatory Visit | Attending: General Surgery | Admitting: General Surgery

## 2015-07-06 ENCOUNTER — Ambulatory Visit (INDEPENDENT_AMBULATORY_CARE_PROVIDER_SITE_OTHER): Payer: PPO | Admitting: Family Medicine

## 2015-07-06 VITALS — BP 122/74 | HR 60 | Temp 97.6°F | Resp 17 | Ht 67.0 in | Wt 201.6 lb

## 2015-07-06 DIAGNOSIS — M150 Primary generalized (osteo)arthritis: Secondary | ICD-10-CM | POA: Diagnosis not present

## 2015-07-06 DIAGNOSIS — M1990 Unspecified osteoarthritis, unspecified site: Secondary | ICD-10-CM | POA: Insufficient documentation

## 2015-07-06 DIAGNOSIS — I6521 Occlusion and stenosis of right carotid artery: Secondary | ICD-10-CM | POA: Insufficient documentation

## 2015-07-06 DIAGNOSIS — E785 Hyperlipidemia, unspecified: Secondary | ICD-10-CM

## 2015-07-06 DIAGNOSIS — M159 Polyosteoarthritis, unspecified: Secondary | ICD-10-CM

## 2015-07-06 MED ORDER — IOHEXOL 350 MG/ML SOLN
80.0000 mL | Freq: Once | INTRAVENOUS | Status: AC | PRN
  Administered 2015-07-06: 80 mL via INTRAVENOUS

## 2015-07-06 MED ORDER — ACETAMINOPHEN-CODEINE #3 300-30 MG PO TABS: 1.0000 | ORAL_TABLET | Freq: Two times a day (BID) | ORAL | Status: DC | PRN

## 2015-07-06 NOTE — Progress Notes (Signed)
Name: Mark [name]   MRN: [id number]    DOB: 01-17-1929   Date:07/06/2015       Progress Note  Subjective  Chief Complaint  Chief Complaint  Patient presents with  . Follow-up    1 mo. cholesterol  . Hyperlipidemia   Hyperlipidemia This is a chronic problem. The problem is uncontrolled. Recent lipid tests were reviewed and are high. Pertinent negatives include no chest pain, leg pain, myalgias or shortness of breath. Current antihyperlipidemic treatment includes statins. There are no compliance problems.  Risk factors for coronary artery disease include dyslipidemia.  Arthritis Presents for follow-up visit. The disease course has been stable. He complains of pain and joint warmth. Affected locations include the right shoulder, left ankle, right ankle and right wrist. Pertinent negatives include no fever. Past treatments include acetaminophen, an opioid and NSAIDs. Compliance with prior treatments has been good.  Has taken Tylenol with codeine and has had good relief of pain in all his joints. Requesting renewal of Tylenol 3.  Past Medical History  Diagnosis Date  . Arthritis   . Hypertension   . Past heart attack 2005    Past Surgical History  Procedure Laterality Date  . Coronary angioplasty with stent placement      Family History  Problem Relation Age of Onset  . Hypertension Mother   . Heart disease Mother   . Heart disease Father   . Hypertension Father     Social History   Social History  . Marital Status: Married    Spouse Name: N/A  . Number of Children: N/A  . Years of Education: N/A   Occupational History  . Not on file.   Social History Main Topics  . Smoking status: Former Smoker    Quit date: 05/16/1963  . Smokeless tobacco: Never Used     Comment: quit over 50 years ago  . Alcohol Use: No  . Drug Use: No  . Sexual Activity: Not on file   Other Topics Concern  . Not on file   Social History Narrative     Current outpatient prescriptions:   .  acetaminophen-codeine (TYLENOL #3) 300-30 MG tablet, Take by mouth every 12 (twelve) hours as needed for moderate pain., Disp: , Rfl:  .  albuterol (PROVENTIL HFA) 108 (90 BASE) MCG/ACT inhaler, 1 inhalation every 4 (four) hours as needed. , Disp: , Rfl:  .  diclofenac (VOLTAREN) 50 MG EC tablet, Take 1 tablet (50 mg total) by mouth 2 (two) times daily., Disp: 60 tablet, Rfl: 0 .  esomeprazole (NEXIUM) 40 MG packet, Take 40 mg by mouth daily before breakfast., Disp: , Rfl:  .  fluticasone (FLONASE) 50 MCG/ACT nasal spray, Place into the nose as needed. , Disp: , Rfl:  .  furosemide (LASIX) 20 MG tablet, Take 1 tablet (20 mg total) by mouth 2 (two) times daily., Disp: 60 tablet, Rfl: 2 .  gabapentin (NEURONTIN) 400 MG capsule, Take 1 capsule (400 mg total) by mouth 2 (two) times daily., Disp: 60 capsule, Rfl: 3 .  Loratadine 10 MG CAPS, Take by mouth., Disp: , Rfl:  .  metoprolol (LOPRESSOR) 50 MG tablet, Take 1 tablet (50 mg total) by mouth 2 (two) times daily., Disp: 60 tablet, Rfl: 0 .  Probiotic Product (PROBIOTIC PO), Take by mouth daily., Disp: , Rfl:  .  rosuvastatin (CRESTOR) 5 MG tablet, Take 1 tablet (5 mg total) by mouth daily., Disp: 90 tablet, Rfl: 3 .  aspirin EC 81 MG tablet,  Take 81 mg by mouth., Disp: , Rfl:   No Known Allergies   Review of Systems  Constitutional: Negative for fever and chills.  Respiratory: Negative for shortness of breath.   Cardiovascular: Negative for chest pain and palpitations.  Gastrointestinal: Negative for abdominal pain.  Musculoskeletal: Positive for joint pain and arthritis. Negative for myalgias.    Objective  Filed Vitals:   07/06/15 0921  BP: 122/74  Pulse: 60  Temp: 97.6 F (36.4 C)  TempSrc: Oral  Resp: 17  Height: 5\' 7"  (1.702 m)  Weight: 201 lb 9.6 oz (91.445 kg)  SpO2: 95%    Physical Exam  Constitutional: He is well-developed, well-nourished, and in no distress.  HENT:  Head: Normocephalic and atraumatic.   Cardiovascular: Normal rate, regular rhythm and normal heart sounds.   Pulmonary/Chest: Effort normal and breath sounds normal. He has no wheezes.  Abdominal: Soft. Bowel sounds are normal.  Musculoskeletal:       Right shoulder: He exhibits tenderness. He exhibits no swelling.       Right wrist: He exhibits tenderness. He exhibits no swelling and no crepitus.       Right ankle: He exhibits no swelling. No tenderness.       Left ankle: He exhibits no swelling. No tenderness.  Nursing note and vitals reviewed.   Recent Results (from the past 2160 hour(s))  Comprehensive Metabolic Panel (CMET)     Status: Abnormal   Collection Time: 05/30/15 10:51 AM  Result Value Ref Range   Glucose 96 65 - 99 mg/dL   BUN 16 8 - 27 mg/dL   Creatinine, Ser 1.00 0.76 - 1.27 mg/dL   GFR calc non Af Amer 69 >59 mL/min/1.73   GFR calc Af Amer 80 >59 mL/min/1.73   BUN/Creatinine Ratio 16 10 - 22   Sodium 139 136 - 144 mmol/L   Potassium 5.5 (H) 3.5 - 5.2 mmol/L   Chloride 101 97 - 106 mmol/L   CO2 27 18 - 29 mmol/L   Calcium 9.0 8.6 - 10.2 mg/dL   Total Protein 6.5 6.0 - 8.5 g/dL   Albumin 3.9 3.5 - 4.7 g/dL   Globulin, Total 2.6 1.5 - 4.5 g/dL   Albumin/Globulin Ratio 1.5 1.1 - 2.5   Bilirubin Total 0.3 0.0 - 1.2 mg/dL   Alkaline Phosphatase 90 39 - 117 IU/L   AST 16 0 - 40 IU/L   ALT 10 0 - 44 IU/L     Assessment & Plan  1. Hyperlipidemia Recheck FLP and follow-up. - Lipid Profile  2. Primary osteoarthritis involving multiple joints We'll start on Tylenol 3 to be taken twice daily as needed. Advised on side effects, and dependence potential of codeine. Follow-up in one month. - acetaminophen-codeine (TYLENOL #3) 300-30 MG tablet; Take 1 tablet by mouth every 12 (twelve) hours as needed for moderate pain.  Dispense: 60 tablet; Refill: 0   Jimmie Dattilio Asad A. [facility] 07/06/2015 9:36 AM

## 2015-07-14 LAB — LIPID PANEL
Chol/HDL Ratio: 2.7 ratio units (ref 0.0–5.0)
Cholesterol, Total: 100 mg/dL (ref 100–199)
HDL: 37 mg/dL — ABNORMAL LOW (ref >39)
LDL Calculated: 52 mg/dL (ref 0–99)
Triglycerides: 56 mg/dL (ref 0–149)
VLDL Cholesterol Cal: 11 mg/dL (ref 5–40)

## 2015-08-03 ENCOUNTER — Ambulatory Visit (INDEPENDENT_AMBULATORY_CARE_PROVIDER_SITE_OTHER): Payer: PPO | Admitting: General Surgery

## 2015-08-03 ENCOUNTER — Telehealth: Payer: Self-pay | Admitting: *Deleted

## 2015-08-03 ENCOUNTER — Encounter: Payer: Self-pay | Admitting: General Surgery

## 2015-08-03 VITALS — BP 106/64 | HR 80 | Resp 16 | Ht 67.0 in | Wt 197.0 lb

## 2015-08-03 DIAGNOSIS — I6521 Occlusion and stenosis of right carotid artery: Secondary | ICD-10-CM

## 2015-08-03 NOTE — Telephone Encounter (Signed)
Patient called wanted to know the results from the CT scan he had done on 07/06/15.

## 2015-08-03 NOTE — Progress Notes (Signed)
This is a 82 year old male here today for his ct scan done on 07/06/15 results . I have reviewed the history of present illness with the patient.  CTA of carotids reviewed and fully discussed with pt. There is moderate plaquing on both sides with a fairly significant ulcerated area on left side. No significant stenosis of  ICA on either side. To date pt has not had any symptoms suggestive of TIA. Recommended consult from vascular surgery regarding the ulcerated plaque on left. Pt is agreeable.  Patient has been scheduled for an appointment with Dr. Delana Meyer at [facility] for 08-15-15 at 8 am. He is aware of date, time, and instructions.    PCP:  Rochel Brome A This information has been scribed by Gaspar Cola CMA.

## 2015-08-03 NOTE — Patient Instructions (Signed)
Patient has been scheduled for an appointment with Dr. Delana Meyer at [facility] for 08-15-15 at 8 am. He is aware of date, time, and instructions.

## 2015-08-03 NOTE — Telephone Encounter (Signed)
Need to see pt for discussion of CTA findings

## 2015-08-05 ENCOUNTER — Ambulatory Visit
Admission: RE | Admit: 2015-08-05 | Discharge: 2015-08-05 | Disposition: A | Discharge status: Home / Self Care | Payer: PPO | Source: Ambulatory Visit | Attending: Family Medicine | Admitting: Family Medicine

## 2015-08-05 ENCOUNTER — Encounter: Payer: Self-pay | Admitting: Family Medicine

## 2015-08-05 ENCOUNTER — Ambulatory Visit (INDEPENDENT_AMBULATORY_CARE_PROVIDER_SITE_OTHER): Payer: PPO | Admitting: Family Medicine

## 2015-08-05 VITALS — BP 122/72 | HR 67 | Temp 97.9°F | Resp 16 | Ht 67.0 in | Wt 199.1 lb

## 2015-08-05 DIAGNOSIS — R001 Bradycardia, unspecified: Secondary | ICD-10-CM | POA: Insufficient documentation

## 2015-08-05 DIAGNOSIS — R0609 Other forms of dyspnea: Secondary | ICD-10-CM

## 2015-08-05 DIAGNOSIS — Z87891 Personal history of nicotine dependence: Secondary | ICD-10-CM | POA: Diagnosis not present

## 2015-08-05 DIAGNOSIS — R9431 Abnormal electrocardiogram [ECG] [EKG]: Secondary | ICD-10-CM

## 2015-08-05 DIAGNOSIS — R0600 Dyspnea, unspecified: Secondary | ICD-10-CM | POA: Insufficient documentation

## 2015-08-05 NOTE — Progress Notes (Signed)
Name: Mark [name]   MRN: [id number]    DOB: Jan 27, 1929   Date:08/05/2015       Progress Note  Subjective  Chief Complaint  Chief Complaint  Patient presents with  . Follow-up  . Hyperlipidemia    Shortness of Breath This is a chronic problem. Episode onset: 6 months. Associated symptoms include ear pain and headaches. Pertinent negatives include no chest pain, fever, hemoptysis, leg pain, leg swelling, orthopnea, sputum production or wheezing. The symptoms are aggravated by any activity (usual activities such as house cleaning or walking makes him short of breath).  referred by Dr. Jamal Collin for assessment of worsening dyspnea. Reports he gets short of breath with usual activities, which has been worsening. No chest pain, coughing, fevers, chills, malaise, weight loss, orthopnea, or other concerning symptoms  Past Medical History  Diagnosis Date  . Arthritis   . Hypertension   . Past heart attack 2005    Past Surgical History  Procedure Laterality Date  . Coronary angioplasty with stent placement      Family History  Problem Relation Age of Onset  . Hypertension Mother   . Heart disease Mother   . Heart disease Father   . Hypertension Father     Social History   Social History  . Marital Status: Married    Spouse Name: N/A  . Number of Children: N/A  . Years of Education: N/A   Occupational History  . Not on file.   Social History Main Topics  . Smoking status: Former Smoker    Quit date: 05/16/1963  . Smokeless tobacco: Never Used     Comment: quit over 50 years ago  . Alcohol Use: No  . Drug Use: No  . Sexual Activity: Not on file   Other Topics Concern  . Not on file   Social History Narrative     Current outpatient prescriptions:  .  acetaminophen-codeine (TYLENOL #3) 300-30 MG tablet, Take 1 tablet by mouth every 12 (twelve) hours as needed for moderate pain., Disp: 60 tablet, Rfl: 0 .  albuterol (PROVENTIL HFA) 108 (90 BASE) MCG/ACT inhaler, 1  inhalation every 4 (four) hours as needed. , Disp: , Rfl:  .  aspirin EC 81 MG tablet, Take 81 mg by mouth., Disp: , Rfl:  .  esomeprazole (NEXIUM) 40 MG packet, Take 40 mg by mouth daily before breakfast., Disp: , Rfl:  .  fluticasone (FLONASE) 50 MCG/ACT nasal spray, Place into the nose as needed. , Disp: , Rfl:  .  furosemide (LASIX) 20 MG tablet, Take 1 tablet (20 mg total) by mouth 2 (two) times daily., Disp: 60 tablet, Rfl: 2 .  gabapentin (NEURONTIN) 400 MG capsule, Take 1 capsule (400 mg total) by mouth 2 (two) times daily., Disp: 60 capsule, Rfl: 3 .  Loratadine 10 MG CAPS, Take by mouth., Disp: , Rfl:  .  metoprolol (LOPRESSOR) 50 MG tablet, Take 1 tablet (50 mg total) by mouth 2 (two) times daily., Disp: 60 tablet, Rfl: 0 .  Probiotic Product (PROBIOTIC PO), Take by mouth daily., Disp: , Rfl:  .  rosuvastatin (CRESTOR) 5 MG tablet, Take 1 tablet (5 mg total) by mouth daily., Disp: 90 tablet, Rfl: 3  No Known Allergies   Review of Systems  Constitutional: Negative for fever.  HENT: Positive for ear pain.   Respiratory: Positive for shortness of breath. Negative for cough, hemoptysis, sputum production and wheezing.   Cardiovascular: Negative for chest pain, palpitations, orthopnea and leg swelling.  Neurological: Positive for headaches.    Objective  Filed Vitals:   08/05/15 1127  BP: 122/72  Pulse: 67  Temp: 97.9 F (36.6 C)  TempSrc: Oral  Resp: 16  Height: 5\' 7"  (1.702 m)  Weight: 199 lb 1.6 oz (90.311 kg)  SpO2: 96%    Physical Exam  Constitutional: He is oriented to person, place, and time and well-developed, well-nourished, and in no distress.  HENT:  Head: Normocephalic and atraumatic.  Neck: Neck supple.  Cardiovascular: Normal rate, regular rhythm and normal heart sounds.   Pulmonary/Chest: Effort normal and breath sounds normal.  Abdominal: Soft. Bowel sounds are normal.  Musculoskeletal: He exhibits edema (1+ pitting edema L>R).  Neurological: He is  alert and oriented to person, place, and time.  Psychiatric: Mood, memory, affect and judgment normal.  Nursing note and vitals reviewed.   Assessment & Plan  1. Dyspnea on exertion We will start workup to rule out probable etiologies for progressive dyspnea including EKG, CXR, BNP, liver and kidney function. - Comprehensive Metabolic Panel (CMET) - Pro b natriuretic peptide - DG Chest 2 View; Future - EKG 12-Lead  2. Abnormal EKG EKG shows sinus bradycardia with borderline first-degree AV block. Referral to cardiology for assessment. - Ambulatory referral to Cardiology   [facility] 08/05/2015 11:47 AM

## 2015-08-06 LAB — COMPREHENSIVE METABOLIC PANEL
ALK PHOS: 120 IU/L — AB (ref 39–117)
ALT: 34 IU/L (ref 0–44)
AST: 36 IU/L (ref 0–40)
Albumin/Globulin Ratio: 1.2 (ref 1.1–2.5)
Albumin: 3.6 g/dL (ref 3.5–4.7)
BUN/Creatinine Ratio: 15 (ref 10–22)
BUN: 15 mg/dL (ref 8–27)
CHLORIDE: 99 mmol/L (ref 96–106)
CO2: 27 mmol/L (ref 18–29)
Calcium: 9.1 mg/dL (ref 8.6–10.2)
Creatinine, Ser: 0.97 mg/dL (ref 0.76–1.27)
GFR calc non Af Amer: 71 mL/min/{1.73_m2} (ref >59)
GFR, EST AFRICAN AMERICAN: 83 mL/min/{1.73_m2} (ref >59)
GLUCOSE: 96 mg/dL (ref 65–99)
Globulin, Total: 3 g/dL (ref 1.5–4.5)
Potassium: 4.9 mmol/L (ref 3.5–5.2)
Sodium: 140 mmol/L (ref 134–144)
TOTAL PROTEIN: 6.6 g/dL (ref 6.0–8.5)

## 2015-08-06 LAB — PRO B NATRIURETIC PEPTIDE: NT-PRO BNP: 205 pg/mL (ref 0–486)

## 2015-08-09 ENCOUNTER — Ambulatory Visit: Payer: PPO | Admitting: General Surgery

## 2015-08-12 ENCOUNTER — Other Ambulatory Visit: Payer: Self-pay | Admitting: Family Medicine

## 2015-08-12 DIAGNOSIS — M159 Polyosteoarthritis, unspecified: Secondary | ICD-10-CM

## 2015-08-12 DIAGNOSIS — M150 Primary generalized (osteo)arthritis: Principal | ICD-10-CM

## 2015-08-12 MED ORDER — ACETAMINOPHEN-CODEINE #3 300-30 MG PO TABS: 1.0000 | ORAL_TABLET | Freq: Two times a day (BID) | ORAL | Status: DC | PRN

## 2015-08-12 NOTE — Telephone Encounter (Signed)
Medication has been refilled by Dr. Manuella Ghazi on 08/12/2015

## 2015-08-12 NOTE — Telephone Encounter (Signed)
Patient is completely out of Tylenol 3. Please send to [facility]

## 2015-08-15 DIAGNOSIS — N186 End stage renal disease: Secondary | ICD-10-CM | POA: Diagnosis not present

## 2015-08-15 DIAGNOSIS — I6523 Occlusion and stenosis of bilateral carotid arteries: Secondary | ICD-10-CM | POA: Diagnosis not present

## 2015-08-15 DIAGNOSIS — E785 Hyperlipidemia, unspecified: Secondary | ICD-10-CM | POA: Diagnosis not present

## 2015-08-15 DIAGNOSIS — I129 Hypertensive chronic kidney disease with stage 1 through stage 4 chronic kidney disease, or unspecified chronic kidney disease: Secondary | ICD-10-CM | POA: Diagnosis not present

## 2015-08-22 DIAGNOSIS — L821 Other seborrheic keratosis: Secondary | ICD-10-CM | POA: Diagnosis not present

## 2015-08-22 DIAGNOSIS — B379 Candidiasis, unspecified: Secondary | ICD-10-CM | POA: Diagnosis not present

## 2015-08-22 DIAGNOSIS — L02222 Furuncle of back [any part, except buttock]: Secondary | ICD-10-CM | POA: Diagnosis not present

## 2015-08-22 DIAGNOSIS — D492 Neoplasm of unspecified behavior of bone, soft tissue, and skin: Secondary | ICD-10-CM | POA: Diagnosis not present

## 2015-08-22 DIAGNOSIS — L570 Actinic keratosis: Secondary | ICD-10-CM | POA: Diagnosis not present

## 2015-09-06 ENCOUNTER — Ambulatory Visit: Payer: PPO | Admitting: Family Medicine

## 2015-09-07 ENCOUNTER — Ambulatory Visit: Payer: PPO | Admitting: Family Medicine

## 2015-09-13 ENCOUNTER — Encounter: Payer: Self-pay | Admitting: Family Medicine

## 2015-09-13 ENCOUNTER — Ambulatory Visit (INDEPENDENT_AMBULATORY_CARE_PROVIDER_SITE_OTHER): Payer: PPO | Admitting: Family Medicine

## 2015-09-13 ENCOUNTER — Ambulatory Visit
Admission: RE | Admit: 2015-09-13 | Discharge: 2015-09-13 | Disposition: A | Discharge status: Home / Self Care | Payer: PPO | Source: Ambulatory Visit | Attending: Family Medicine | Admitting: Family Medicine

## 2015-09-13 VITALS — BP 122/70 | HR 75 | Temp 98.0°F | Resp 18 | Ht 67.0 in | Wt 197.1 lb

## 2015-09-13 DIAGNOSIS — M25511 Pain in right shoulder: Secondary | ICD-10-CM | POA: Diagnosis not present

## 2015-09-13 DIAGNOSIS — M159 Polyosteoarthritis, unspecified: Secondary | ICD-10-CM

## 2015-09-13 DIAGNOSIS — M150 Primary generalized (osteo)arthritis: Secondary | ICD-10-CM

## 2015-09-13 DIAGNOSIS — M19011 Primary osteoarthritis, right shoulder: Secondary | ICD-10-CM | POA: Diagnosis not present

## 2015-09-13 MED ORDER — ACETAMINOPHEN-CODEINE #3 300-30 MG PO TABS: 1.0000 | ORAL_TABLET | Freq: Three times a day (TID) | ORAL | Status: DC | PRN

## 2015-09-13 NOTE — Progress Notes (Signed)
Name: Mark [name]   MRN: [id number]    DOB: 1928/12/30   Date:09/13/2015       Progress Note  Subjective  Chief Complaint  Chief Complaint  Patient presents with  . Follow-up    Arthritis  . Hyperlipidemia    Arthritis Presents for follow-up visit. The disease course has been stable. He complains of pain and stiffness. Affected locations include the right shoulder, right ankle, left shoulder and left knee. Associated symptoms include pain at night. Past treatments include an opioid and acetaminophen. Compliance with prior treatments has been good.   Has been taking Tylenol 3 one tablet twice daily as needed.  Past Medical History  Diagnosis Date  . Arthritis   . Hypertension   . Past heart attack 2005    Past Surgical History  Procedure Laterality Date  . Coronary angioplasty with stent placement      Family History  Problem Relation Age of Onset  . Hypertension Mother   . Heart disease Mother   . Heart disease Father   . Hypertension Father     Social History   Social History  . Marital Status: Married    Spouse Name: N/A  . Number of Children: N/A  . Years of Education: N/A   Occupational History  . Not on file.   Social History Main Topics  . Smoking status: Former Smoker    Quit date: 05/16/1963  . Smokeless tobacco: Never Used     Comment: quit over 50 years ago  . Alcohol Use: No  . Drug Use: No  . Sexual Activity: Not on file   Other Topics Concern  . Not on file   Social History Narrative     Current outpatient prescriptions:  .  acetaminophen-codeine (TYLENOL #3) 300-30 MG tablet, Take 1 tablet by mouth every 12 (twelve) hours as needed for moderate pain., Disp: 60 tablet, Rfl: 2 .  albuterol (PROVENTIL HFA) 108 (90 BASE) MCG/ACT inhaler, 1 inhalation every 4 (four) hours as needed. , Disp: , Rfl:  .  aspirin EC 81 MG tablet, Take 81 mg by mouth., Disp: , Rfl:  .  clopidogrel (PLAVIX) 75 MG tablet, Take 75 mg by mouth daily., Disp: ,  Rfl:  .  esomeprazole (NEXIUM) 40 MG packet, Take 40 mg by mouth daily before breakfast., Disp: , Rfl:  .  fluticasone (FLONASE) 50 MCG/ACT nasal spray, Place into the nose as needed. , Disp: , Rfl:  .  furosemide (LASIX) 20 MG tablet, Take 1 tablet (20 mg total) by mouth 2 (two) times daily., Disp: 60 tablet, Rfl: 2 .  gabapentin (NEURONTIN) 400 MG capsule, Take 1 capsule (400 mg total) by mouth 2 (two) times daily., Disp: 60 capsule, Rfl: 3 .  Loratadine 10 MG CAPS, Take by mouth., Disp: , Rfl:  .  metoprolol (LOPRESSOR) 50 MG tablet, Take 1 tablet (50 mg total) by mouth 2 (two) times daily., Disp: 60 tablet, Rfl: 0 .  Probiotic Product (PROBIOTIC PO), Take by mouth daily., Disp: , Rfl:  .  rosuvastatin (CRESTOR) 5 MG tablet, Take 1 tablet (5 mg total) by mouth daily., Disp: 90 tablet, Rfl: 3  No Known Allergies   Review of Systems  Musculoskeletal: Positive for arthritis and stiffness.     Objective  Filed Vitals:   09/13/15 1442  BP: 122/70  Pulse: 75  Temp: 98 F (36.7 C)  TempSrc: Oral  Resp: 18  Height: 5\' 7"  (1.702 m)  Weight: 197 lb 1.6 oz (  89.404 kg)  SpO2: 96%    Physical Exam  Constitutional: He is oriented to person, place, and time and well-developed, well-nourished, and in no distress.  Neurological: He is alert and oriented to person, place, and time.  Nursing note and vitals reviewed.      Assessment & Plan  1. Primary osteoarthritis involving multiple joints Increased Tylenol #3 to 3 times daily as needed to help relieve the pain more effectively. - acetaminophen-codeine (TYLENOL #3) 300-30 MG tablet; Take 1 tablet by mouth every 8 (eight) hours as needed for moderate pain.  Dispense: 90 tablet; Refill: 0  2. Pain in right shoulder Suspect osteoarthritis, we will obtain x-rays and follow-up. - DG Shoulder Right; Future - DG Humerus Right; Future   Aigner Horseman Asad A. [facility] 09/13/2015 2:55 PM

## 2015-09-22 ENCOUNTER — Other Ambulatory Visit: Payer: Self-pay | Admitting: Family Medicine

## 2015-09-22 ENCOUNTER — Telehealth: Payer: Self-pay | Admitting: Family Medicine

## 2015-09-22 NOTE — Telephone Encounter (Signed)
Medication has been refilled and sent to [facility]

## 2015-09-22 NOTE — Telephone Encounter (Signed)
Medication has been refilled and sent to [facility] 09/22/2015

## 2015-09-22 NOTE — Telephone Encounter (Signed)
PT SAID THAT HE IS OUT OF HIS METOPROLOL AND HAS NOT HAD ANY IN 3 DAYS. SAID THAT THE [facility] HAS SENT A REQUEST. PLEASE GIVE STATUS OF REFILL REQUEST.

## 2015-09-26 ENCOUNTER — Other Ambulatory Visit: Payer: Self-pay | Admitting: Family Medicine

## 2015-09-26 ENCOUNTER — Ambulatory Visit (INDEPENDENT_AMBULATORY_CARE_PROVIDER_SITE_OTHER): Payer: PPO | Admitting: Cardiovascular Disease

## 2015-09-26 ENCOUNTER — Other Ambulatory Visit: Payer: Self-pay

## 2015-09-26 ENCOUNTER — Encounter: Payer: Self-pay | Admitting: Cardiovascular Disease

## 2015-09-26 ENCOUNTER — Encounter (INDEPENDENT_AMBULATORY_CARE_PROVIDER_SITE_OTHER): Payer: Self-pay

## 2015-09-26 ENCOUNTER — Ambulatory Visit (INDEPENDENT_AMBULATORY_CARE_PROVIDER_SITE_OTHER): Payer: PPO

## 2015-09-26 VITALS — BP 100/68 | HR 58 | Ht 72.0 in | Wt 198.2 lb

## 2015-09-26 DIAGNOSIS — R0602 Shortness of breath: Secondary | ICD-10-CM

## 2015-09-26 NOTE — Progress Notes (Signed)
Cardiology Office Note   Date:  09/26/2015   ID:  Mark [name], DOB 22-May-1929, MRN [id number]  PCP:  Keith Rake, MD  Cardiologist:   Kathlyn Sacramento, MD . He saw Dr. Nehemiah Massed in the past  Chief Complaint  Patient presents with  . other    Ref by Dr. Manuella Ghazi to establish care for CAD. Pt. saw Dr. Serafina Royals in the past. Meds reviewed by the patient verbally. "doing well."       History of Present Illness: Mark [name] is a 82 y.o. male who presents for evaluation of exertional dyspnea. He has known history of coronary artery disease with previous anterior ST elevation myocardial infarction in 2005. He was transferred to [facility] where he underwent cardiac catheterization which showed an occluded mid LAD which was treated successfully with angioplasty and Cypher ( 3.0 x 18 mm) drug-eluting stent placement. There was mild left circumflex and RCA disease. Ejection fraction was 32% and subsequently improved to 50% in 2006. He has followed up with Dr. Nehemiah Massed after that but not in the recent years. He reports having a stress test done about 5 years ago which was unremarkable. Previous symptoms at the time of myocardial infarction was intense substernal chest pain. He has not had any recurrent symptoms similar of that. His only complaint is mild exertional dyspnea which has not worsened recently. No orthopnea, PND or lower extremity edema. He had labs done recently which were unremarkable including normal BNP. He is not a smoker. She does have family history of coronary artery disease.    Past Medical History  Diagnosis Date  . Arthritis   . Hypertension   . Past heart attack 2005  . Coronary artery disease 2005  . Hyperlipidemia   . Carotid artery disease ([place])   . Hyperkalemia     Past Surgical History  Procedure Laterality Date  . Coronary angioplasty with stent placement  2005    [facility]     Current Outpatient Prescriptions  Medication Sig Dispense Refill  .  acetaminophen-codeine (TYLENOL #3) 300-30 MG tablet Take 1 tablet by mouth every 8 (eight) hours as needed for moderate pain. 90 tablet 0  . albuterol (PROVENTIL HFA) 108 (90 BASE) MCG/ACT inhaler 1 inhalation every 4 (four) hours as needed.     Marland Kitchen aspirin EC 81 MG tablet Take 81 mg by mouth.    . clopidogrel (PLAVIX) 75 MG tablet Take 75 mg by mouth daily.    Marland Kitchen esomeprazole (NEXIUM) 40 MG packet Take 40 mg by mouth daily before breakfast.    . fluticasone (FLONASE) 50 MCG/ACT nasal spray Place into the nose as needed.     . furosemide (LASIX) 20 MG tablet Take 1 tablet (20 mg total) by mouth 2 (two) times daily. 60 tablet 2  . gabapentin (NEURONTIN) 400 MG capsule Take 1 capsule (400 mg total) by mouth 2 (two) times daily. 60 capsule 3  . Loratadine 10 MG CAPS Take by mouth.    . metoprolol (LOPRESSOR) 50 MG tablet Take 1 tablet (50 mg total) by mouth 2 (two) times daily. 60 tablet 0  . Probiotic Product (PROBIOTIC PO) Take by mouth daily.    . rosuvastatin (CRESTOR) 5 MG tablet Take 1 tablet (5 mg total) by mouth daily. 90 tablet 3   No current facility-administered medications for this visit.    Allergies:   Review of patient's allergies indicates no known allergies.    Social History:  The patient  reports that  he quit smoking about 52 years ago. He has never used smokeless tobacco. He reports that he does not drink alcohol or use illicit drugs.   Family History:  The patient's family history includes Heart disease in his father and mother; Hypertension in his father and mother.    ROS:  Please see the history of present illness.   Otherwise, review of systems are positive for none.   All other systems are reviewed and negative.    PHYSICAL EXAM: VS:  BP 100/68 mmHg  Pulse 58  Ht 6' (1.829 m)  Wt 198 lb 4 oz (89.926 kg)  BMI 26.88 kg/m2 , BMI Body mass index is 26.88 kg/(m^2). GEN: Well nourished, well developed, in no acute distress HEENT: normal Neck: no JVD, carotid bruits,  or masses Cardiac: RRR; no murmurs, rubs, or gallops,no edema  Respiratory:  clear to auscultation bilaterally, normal work of breathing GI: soft, nontender, nondistended, + BS MS: no deformity or atrophy Skin: warm and dry, no rash Neuro:  Strength and sensation are intact Psych: euthymic mood, full affect   EKG:  EKG is not ordered today. EKG from January was reviewed which showed sinus bradycardia with nonspecific IVCD.   Recent Labs: 08/05/2015: ALT 34; BUN 15; Creatinine, Ser 0.97; NT-Pro BNP 205; Potassium 4.9; Sodium 140    Lipid Panel    Component Value Date/Time   CHOL 100 07/13/2015 0812   TRIG 56 07/13/2015 0812   HDL 37* 07/13/2015 0812   CHOLHDL 2.7 07/13/2015 0812   LDLCALC 52 07/13/2015 0812      Wt Readings from Last 3 Encounters:  09/26/15 198 lb 4 oz (89.926 kg)  09/13/15 197 lb 1.6 oz (89.404 kg)  08/05/15 199 lb 1.6 oz (90.311 kg)      Other studies Reviewed: Additional studies/ records that were reviewed today include: previous cardiac catheterization in 2005 at [facility]. Review of the above records demonstrates: discussed above.   ASSESSMENT AND PLAN:  1.  Coronary artery disease involving native coronary arteries without angina: patient previous presentation with myocardial infarction was manifested by intense chest pain. He reports no similar symptoms since then. His current exertional dyspnea is very mild and I don't think it's related to obstructive coronary artery disease. He had a first generation drug-eluting stent placed and I think it's reasonable to continue long-term dual antiplatelet therapy as long as it's tolerated. Continue treatment of risk factors.  2. Exertional dyspnea: I am going to check an echocardiogram to ensure that his LV systolic function is normal. If his EF is low, he might require treatment with an ACE inhibitor but that might be limited by relatively low blood pressure.  3. Hyperlipidemia: his lipid profile improved  significantly recently after resuming small dose rosuvastatin. His LDL was 52.     Disposition:   FU with Me  As needed if symptoms worsen or echocardiogram is abnormal  Signed, Kathlyn Sacramento, MD  09/26/2015 4:17 PM    [facility]

## 2015-09-26 NOTE — Patient Instructions (Signed)
Medication Instructions:  Your physician recommends that you continue on your current medications as directed. Please refer to the Current Medication list given to you today.   Labwork: None at this time  Testing/Procedures: Your physician has requested that you have an echocardiogram. Echocardiography is a painless test that uses sound waves to create images of your heart. It provides your doctor with information about the size and shape of your heart and how well your heart's chambers and valves are working. This procedure takes approximately one hour. There are no restrictions for this procedure.    Follow-Up: Your physician recommends that you schedule a follow-up appointment as needed.   Any Other Special Instructions Will Be Listed Below (If Applicable).     If you need a refill on your cardiac medications before your next appointment, please call your pharmacy.  Echocardiogram An echocardiogram, or echocardiography, uses sound waves (ultrasound) to produce an image of your heart. The echocardiogram is simple, painless, obtained within a short period of time, and offers valuable information to your health care provider. The images from an echocardiogram can provide information such as:  Evidence of coronary artery disease (CAD).  Heart size.  Heart muscle function.  Heart valve function.  Aneurysm detection.  Evidence of a past heart attack.  Fluid buildup around the heart.  Heart muscle thickening.  Assess heart valve function. LET [facility] CARE PROVIDER KNOW ABOUT:  Any allergies you have.  All medicines you are taking, including vitamins, herbs, eye drops, creams, and over-the-counter medicines.  Previous problems you or members of your family have had with the use of anesthetics.  Any blood disorders you have.  Previous surgeries you have had.  Medical conditions you have.  Possibility of pregnancy, if this applies. BEFORE THE PROCEDURE  No  special preparation is needed. Eat and drink normally.  PROCEDURE   In order to produce an image of your heart, gel will be applied to your chest and a wand-like tool (transducer) will be moved over your chest. The gel will help transmit the sound waves from the transducer. The sound waves will harmlessly bounce off your heart to allow the heart images to be captured in real-time motion. These images will then be recorded.  You may need an IV to receive a medicine that improves the quality of the pictures. AFTER THE PROCEDURE You may return to your normal schedule including diet, activities, and medicines, unless your health care provider tells you otherwise.   This information is not intended to replace advice given to you by your health care provider. Make sure you discuss any questions you have with your health care provider.   Document Released: 07/13/2000 Document Revised: 08/06/2014 Document Reviewed: 03/23/2013 Elsevier Interactive Patient Education [employer].

## 2015-10-04 ENCOUNTER — Ambulatory Visit: Payer: PPO | Admitting: Family Medicine

## 2015-10-10 DIAGNOSIS — C44622 Squamous cell carcinoma of skin of right upper limb, including shoulder: Secondary | ICD-10-CM | POA: Diagnosis not present

## 2015-10-10 DIAGNOSIS — L570 Actinic keratosis: Secondary | ICD-10-CM | POA: Diagnosis not present

## 2015-10-10 DIAGNOSIS — Z85828 Personal history of other malignant neoplasm of skin: Secondary | ICD-10-CM | POA: Insufficient documentation

## 2015-10-11 ENCOUNTER — Encounter: Payer: Self-pay | Admitting: Family Medicine

## 2015-10-11 ENCOUNTER — Ambulatory Visit (INDEPENDENT_AMBULATORY_CARE_PROVIDER_SITE_OTHER): Payer: PPO | Admitting: Family Medicine

## 2015-10-11 VITALS — BP 100/71 | HR 71 | Temp 97.6°F | Resp 15 | Ht 72.0 in | Wt 200.0 lb

## 2015-10-11 DIAGNOSIS — R062 Wheezing: Secondary | ICD-10-CM | POA: Diagnosis not present

## 2015-10-11 DIAGNOSIS — E785 Hyperlipidemia, unspecified: Secondary | ICD-10-CM

## 2015-10-11 DIAGNOSIS — M19011 Primary osteoarthritis, right shoulder: Secondary | ICD-10-CM | POA: Diagnosis not present

## 2015-10-11 MED ORDER — ATORVASTATIN CALCIUM 10 MG PO TABS: 10.0000 mg | ORAL_TABLET | Freq: Every day | ORAL | Status: DC

## 2015-10-11 MED ORDER — ALBUTEROL SULFATE HFA 108 (90 BASE) MCG/ACT IN AERS: 2.0000 | INHALATION_SPRAY | Freq: Four times a day (QID) | RESPIRATORY_TRACT | Status: DC | PRN

## 2015-10-11 NOTE — Progress Notes (Signed)
Name: Mark [name]   MRN: [id number]    DOB: 03-05-1929   Date:10/11/2015       Progress Note  Subjective  Chief Complaint  Chief Complaint  Patient presents with  . Follow-up    3 mo  . Hyperlipidemia    Hyperlipidemia This is a chronic problem. The problem is controlled. Recent lipid tests were reviewed and are normal. Pertinent negatives include no chest pain, leg pain, myalgias or shortness of breath. Current antihyperlipidemic treatment includes statins.  Patient requests change in statin therapy to something less expensive as he cannot afford the co-pay for Crestor.  Wheezing: Occasional (maybe once a month), he takes Ventolin inhaler which helps relieve his symptoms. No coughing or shortness of breath with usual activities.   Arthritis of Shoulder: Moderate Osteoarthritis in right shoulder (both glenohumeral and acromioclavicular joint). Pain better on some days and worse during others. No pain with overhead activity.  Past Medical History  Diagnosis Date  . Arthritis   . Hypertension   . Past heart attack 2005  . Coronary artery disease 2005  . Hyperlipidemia   . Carotid artery disease ([place])   . Hyperkalemia     Past Surgical History  Procedure Laterality Date  . Coronary angioplasty with stent placement  2005    [facility]    Family History  Problem Relation Age of Onset  . Hypertension Mother   . Heart disease Mother   . Heart disease Father   . Hypertension Father     Social History   Social History  . Marital Status: Married    Spouse Name: N/A  . Number of Children: N/A  . Years of Education: N/A   Occupational History  . Not on file.   Social History Main Topics  . Smoking status: Former Smoker    Quit date: 05/16/1963  . Smokeless tobacco: Never Used     Comment: quit over 50 years ago  . Alcohol Use: No  . Drug Use: No  . Sexual Activity: Not on file   Other Topics Concern  . Not on file   Social History Narrative     Current  outpatient prescriptions:  .  acetaminophen-codeine (TYLENOL #3) 300-30 MG tablet, Take 1 tablet by mouth every 8 (eight) hours as needed for moderate pain., Disp: 90 tablet, Rfl: 0 .  albuterol (PROVENTIL HFA) 108 (90 BASE) MCG/ACT inhaler, 1 inhalation every 4 (four) hours as needed. , Disp: , Rfl:  .  aspirin EC 81 MG tablet, Take 81 mg by mouth., Disp: , Rfl:  .  clopidogrel (PLAVIX) 75 MG tablet, Take 75 mg by mouth daily., Disp: , Rfl:  .  esomeprazole (NEXIUM) 40 MG packet, Take 40 mg by mouth daily before breakfast., Disp: , Rfl:  .  fluticasone (FLONASE) 50 MCG/ACT nasal spray, Place into the nose as needed. , Disp: , Rfl:  .  furosemide (LASIX) 20 MG tablet, Take 1 tablet (20 mg total) by mouth 2 (two) times daily., Disp: 60 tablet, Rfl: 2 .  gabapentin (NEURONTIN) 400 MG capsule, Take 1 capsule (400 mg total) by mouth 2 (two) times daily., Disp: 60 capsule, Rfl: 0 .  Loratadine 10 MG CAPS, Take by mouth., Disp: , Rfl:  .  metoprolol (LOPRESSOR) 50 MG tablet, Take 1 tablet (50 mg total) by mouth 2 (two) times daily., Disp: 60 tablet, Rfl: 0 .  pantoprazole (PROTONIX) 40 MG tablet, , Disp: , Rfl:  .  Probiotic Product (PROBIOTIC PO), Take by mouth  daily., Disp: , Rfl:  .  rosuvastatin (CRESTOR) 5 MG tablet, Take 1 tablet (5 mg total) by mouth daily., Disp: 90 tablet, Rfl: 3  No Known Allergies   Review of Systems  Respiratory: Negative for shortness of breath.   Cardiovascular: Negative for chest pain.  Musculoskeletal: Positive for joint pain. Negative for myalgias.    Objective  Filed Vitals:   10/11/15 1546  BP: 100/71  Pulse: 71  Temp: 97.6 F (36.4 C)  TempSrc: Oral  Resp: 15  Height: 6' (1.829 m)  Weight: 200 lb (90.719 kg)  SpO2: 96%    Physical Exam  Constitutional: He is oriented to person, place, and time and well-developed, well-nourished, and in no distress.  HENT:  Head: Normocephalic and atraumatic.  Cardiovascular: Normal rate and regular rhythm.    Pulmonary/Chest: Effort normal. He has wheezes. He has no rhonchi.  Bilateral expiratory wheezing on auscultation  Musculoskeletal:       Right shoulder: He exhibits tenderness and pain. He exhibits normal range of motion.  Tenderness to palpation over the right anterolateral shoulder. Normal ROM, including overhead.   Neurological: He is alert and oriented to person, place, and time.  Nursing note and vitals reviewed.    Assessment & Plan  1. Hyperlipidemia We will change statin therapy from Crestor to Lipitor 10 mg at bedtime. Repeat FLP in June 2017. - atorvastatin (LIPITOR) 10 MG tablet; Take 1 tablet (10 mg total) by mouth daily.  Dispense: 90 tablet; Refill: 3  2. Primary osteoarthritis of right shoulder Believes that Tylenol 3 is not helping relieve his pain. Recommended that he contact his cardiologist to determine if it's okay to take meloxicam given that he is on dual antiplatelet therapy. He will consult and let us know.  3. Wheezing on auscultation  - albuterol (PROVENTIL HFA) 108 (90 Base) MCG/ACT inhaler; Inhale 2 puffs into the lungs every 6 (six) hours as needed for wheezing or shortness of breath.  Dispense: 1 Inhaler; Refill: 1   Sophia Sperry [facility] 10/11/2015 4:10 PM

## 2015-10-12 ENCOUNTER — Telehealth: Payer: Self-pay | Admitting: Cardiovascular Disease

## 2015-10-12 NOTE — Telephone Encounter (Signed)
Patient aware of Dr. Tyrell Antonio recommendations. We also discussed the increased risk of bleeding with Meloxicam and dual antiplatelet therapy during his office visit. At this point, we will not recommend treatment with meloxicam for arthritis.

## 2015-10-12 NOTE — Telephone Encounter (Signed)
There is really no clearance for giving Meloxicam when somebody is getting Aspirin and Plavix. Basically, the combination increases his risk of bleeding. This has to be discussed between Dr. Manuella Ghazi and the patient. Other non-NSAIDs medications can be considered as an alternative.  I will forward this to Dr. Manuella Ghazi as well.

## 2015-10-12 NOTE — Telephone Encounter (Signed)
S/w pt who reports at yesterday's PCP OV, meloxicam was discussed for tx of pt's arthritis. Pt states Dr. Manuella Ghazi will not prescribe this unless Dr. Fletcher Anon is agreeable as pt takes 81mg  asa and plavix. Advised pt to continue dual antiplatelets as scheduled and I will forward to MD to advise.

## 2015-10-12 NOTE — Telephone Encounter (Signed)
Pt states he saw his PCP yesterday, asks if he can stop his aspirin and Plavix, and if he can mix Meloxicam with these meds. Please advise.

## 2015-10-12 NOTE — Telephone Encounter (Signed)
S/w pt of Dr. Tyrell Antonio recommendations. Pt verbalized understanding.

## 2015-10-13 ENCOUNTER — Telehealth: Payer: Self-pay | Admitting: Family Medicine

## 2015-10-13 NOTE — Telephone Encounter (Signed)
Returned call and discussed that according to Dr. Fletcher Anon (Cardiologist), adding meloxicam to aspirin and Plavix would increase his risk of bleeding which is what I had mentioned to him as well. Will not recommend meloxicam at this time and will consider a different therapy at his next appointment. Patient verbalized agreement.

## 2015-10-13 NOTE — Telephone Encounter (Signed)
Requesting a return call to discuss what the heart doctor told him

## 2015-10-13 NOTE — Telephone Encounter (Signed)
Routed to Dr. Shah for patient callback request 

## 2015-10-14 ENCOUNTER — Telehealth: Payer: Self-pay | Admitting: Family Medicine

## 2015-10-14 ENCOUNTER — Other Ambulatory Visit: Payer: Self-pay | Admitting: Family Medicine

## 2015-10-14 DIAGNOSIS — M150 Primary generalized (osteo)arthritis: Principal | ICD-10-CM

## 2015-10-14 DIAGNOSIS — M159 Polyosteoarthritis, unspecified: Secondary | ICD-10-CM

## 2015-10-14 MED ORDER — ACETAMINOPHEN-CODEINE #3 300-30 MG PO TABS: 1.0000 | ORAL_TABLET | Freq: Three times a day (TID) | ORAL | Status: DC | PRN

## 2015-10-14 NOTE — Telephone Encounter (Signed)
Prescription for Tylenol # 3 is ready for pickup.

## 2015-10-14 NOTE — Telephone Encounter (Signed)
Pt needs rx for tylenol ( whatever he is taking for pain). phamr is [facility]. Pt is out

## 2015-10-17 ENCOUNTER — Other Ambulatory Visit: Payer: Self-pay | Admitting: Family Medicine

## 2015-10-23 ENCOUNTER — Other Ambulatory Visit: Payer: Self-pay | Admitting: Family Medicine

## 2015-10-27 ENCOUNTER — Other Ambulatory Visit: Payer: Self-pay | Admitting: Family Medicine

## 2015-11-03 DIAGNOSIS — M150 Primary generalized (osteo)arthritis: Secondary | ICD-10-CM | POA: Diagnosis not present

## 2015-11-03 DIAGNOSIS — M25511 Pain in right shoulder: Secondary | ICD-10-CM | POA: Diagnosis not present

## 2015-11-03 DIAGNOSIS — M79671 Pain in right foot: Secondary | ICD-10-CM | POA: Diagnosis not present

## 2015-11-03 DIAGNOSIS — M79672 Pain in left foot: Secondary | ICD-10-CM | POA: Diagnosis not present

## 2015-11-03 DIAGNOSIS — G8929 Other chronic pain: Secondary | ICD-10-CM | POA: Diagnosis not present

## 2015-11-20 ENCOUNTER — Other Ambulatory Visit: Payer: Self-pay | Admitting: Family Medicine

## 2015-11-29 DIAGNOSIS — M216X1 Other acquired deformities of right foot: Secondary | ICD-10-CM | POA: Diagnosis not present

## 2015-11-29 DIAGNOSIS — M659 Synovitis and tenosynovitis, unspecified: Secondary | ICD-10-CM | POA: Diagnosis not present

## 2015-11-29 DIAGNOSIS — M216X2 Other acquired deformities of left foot: Secondary | ICD-10-CM | POA: Diagnosis not present

## 2015-12-24 ENCOUNTER — Other Ambulatory Visit: Payer: Self-pay | Admitting: Family Medicine

## 2015-12-29 ENCOUNTER — Telehealth: Payer: Self-pay

## 2015-12-29 MED ORDER — METOPROLOL TARTRATE 50 MG PO TABS: ORAL_TABLET | ORAL | Status: DC

## 2015-12-29 NOTE — Telephone Encounter (Signed)
Medication has been refilled and sent to [facility]

## 2016-01-11 ENCOUNTER — Other Ambulatory Visit: Payer: Self-pay | Admitting: Family Medicine

## 2016-01-23 ENCOUNTER — Other Ambulatory Visit: Payer: Self-pay | Admitting: Family Medicine

## 2016-01-23 DIAGNOSIS — M150 Primary generalized (osteo)arthritis: Secondary | ICD-10-CM | POA: Diagnosis not present

## 2016-01-23 DIAGNOSIS — M25511 Pain in right shoulder: Secondary | ICD-10-CM | POA: Diagnosis not present

## 2016-01-23 DIAGNOSIS — G8929 Other chronic pain: Secondary | ICD-10-CM | POA: Diagnosis not present

## 2016-02-08 ENCOUNTER — Ambulatory Visit (INDEPENDENT_AMBULATORY_CARE_PROVIDER_SITE_OTHER): Payer: PPO | Admitting: Family Medicine

## 2016-02-08 ENCOUNTER — Encounter: Payer: Self-pay | Admitting: Family Medicine

## 2016-02-08 VITALS — BP 100/70 | HR 67 | Temp 97.6°F | Resp 15 | Ht 72.0 in | Wt 197.0 lb

## 2016-02-08 DIAGNOSIS — R062 Wheezing: Secondary | ICD-10-CM

## 2016-02-08 DIAGNOSIS — M7989 Other specified soft tissue disorders: Secondary | ICD-10-CM

## 2016-02-08 DIAGNOSIS — E785 Hyperlipidemia, unspecified: Secondary | ICD-10-CM

## 2016-02-08 MED ORDER — ALBUTEROL SULFATE HFA 108 (90 BASE) MCG/ACT IN AERS: 2.0000 | INHALATION_SPRAY | Freq: Four times a day (QID) | RESPIRATORY_TRACT | Status: DC | PRN

## 2016-02-08 MED ORDER — FUROSEMIDE 20 MG PO TABS: 20.0000 mg | ORAL_TABLET | Freq: Two times a day (BID) | ORAL | Status: DC

## 2016-02-08 NOTE — Progress Notes (Signed)
Name: Mark [name]   MRN: [id number]    DOB: 1929-04-02   Date:02/08/2016       Progress Note  Subjective  Chief Complaint  Chief Complaint  Patient presents with  . Medication Refill    HPI  Shortness of Breath:  Pt. Feels he is more short of breath, fatigued, and occasionally experiences wheezing. He has been on Ventolin inhaler and uses it periodically (not every day). Sometimes, he has allergies and coughing and Ventolin helps relieve his symptoms. He has been seen by Cardiology and EF was 55%. He also has bilateral leg swelling (right worse than left), relieved with elevation.  Hyperlipidemia: Last FLP obtained in December 2016, HDL below normal, on Atorvastatin 10 mg at bedtime. No significant side effects reported. No myalgias, elevation in liver enzymes etc.    Past Medical History  Diagnosis Date  . Arthritis   . Hypertension   . Past heart attack 2005  . Coronary artery disease 2005  . Hyperlipidemia   . Carotid artery disease ([place])   . Hyperkalemia     Past Surgical History  Procedure Laterality Date  . Coronary angioplasty with stent placement  2005    [facility]    Family History  Problem Relation Age of Onset  . Hypertension Mother   . Heart disease Mother   . Heart disease Father   . Hypertension Father     Social History   Social History  . Marital Status: Married    Spouse Name: N/A  . Number of Children: N/A  . Years of Education: N/A   Occupational History  . Not on file.   Social History Main Topics  . Smoking status: Former Smoker    Quit date: 05/16/1963  . Smokeless tobacco: Never Used     Comment: quit over 50 years ago  . Alcohol Use: No  . Drug Use: No  . Sexual Activity: Not on file   Other Topics Concern  . Not on file   Social History Narrative     Current outpatient prescriptions:  .  aspirin EC 81 MG tablet, Take 81 mg by mouth., Disp: , Rfl:  .  atorvastatin (LIPITOR) 10 MG tablet, Take 1 tablet (10 mg total) by mouth  daily., Disp: 90 tablet, Rfl: 3 .  clopidogrel (PLAVIX) 75 MG tablet, Take 75 mg by mouth daily., Disp: , Rfl:  .  esomeprazole (NEXIUM) 40 MG packet, Take 40 mg by mouth daily before breakfast., Disp: , Rfl:  .  fluticasone (FLONASE) 50 MCG/ACT nasal spray, Place into the nose as needed. , Disp: , Rfl:  .  furosemide (LASIX) 20 MG tablet, Take 1 tablet (20 mg total) by mouth 2 (two) times daily., Disp: 60 tablet, Rfl: 0 .  gabapentin (NEURONTIN) 400 MG capsule, Take 1 capsule (400 mg total) by mouth 2 (two) times daily., Disp: 60 capsule, Rfl: 2 .  Loratadine 10 MG CAPS, Take by mouth., Disp: , Rfl:  .  metoprolol (LOPRESSOR) 50 MG tablet, Take 1 tablet (50 mg total) by mouth 2 (two) times daily., Disp: 60 tablet, Rfl: 1 .  pantoprazole (PROTONIX) 40 MG tablet, , Disp: , Rfl:  .  Probiotic Product (PROBIOTIC PO), Take by mouth daily., Disp: , Rfl:  .  PROVENTIL HFA 108 (90 Base) MCG/ACT inhaler, Inhale 2 puffs into the lungs every 6 (six) hours as needed for wheezing or shortness of breath., Disp: 6.7 g, Rfl: 0  No Known Allergies   Review of Systems  Constitutional: Negative for fever and chills.  Respiratory: Positive for cough. Negative for shortness of breath and wheezing.   Cardiovascular: Positive for leg swelling. Negative for chest pain.  Musculoskeletal: Positive for joint pain.     Objective  Filed Vitals:   02/08/16 0943  BP: 100/70  Pulse: 67  Temp: 97.6 F (36.4 C)  TempSrc: Oral  Resp: 15  Height: 6' (1.829 m)  Weight: 197 lb (89.359 kg)  SpO2: 96%    Physical Exam  Constitutional: He is well-developed, well-nourished, and in no distress.  Cardiovascular: Normal rate, regular rhythm and normal heart sounds.   No murmur heard. Pulmonary/Chest: Effort normal and breath sounds normal. He has no wheezes.  Musculoskeletal:       Right ankle: He exhibits swelling. Tenderness.       Left ankle: He exhibits swelling. Tenderness.  Psychiatric: Mood, memory, affect  and judgment normal.  Nursing note and vitals reviewed.   Assessment & Plan  1. Wheezing on auscultation Likely from airflow obstruction, continue on when necessary albuterol. - albuterol (VENTOLIN HFA) 108 (90 Base) MCG/ACT inhaler; Inhale 2 puffs into the lungs every 6 (six) hours as needed for wheezing or shortness of breath.  Dispense: 1 Inhaler; Refill: 2  2. Hyperlipidemia Improving FLP, continue on statin therapy - Lipid Profile - COMPLETE METABOLIC PANEL WITH GFR  3. Leg swelling Dependent edema, responsive to diuretic therapy - furosemide (LASIX) 20 MG tablet; Take 1 tablet (20 mg total) by mouth 2 (two) times daily.  Dispense: 180 tablet; Refill: 0   [name] [name] 02/08/2016 9:59 AM

## 2016-02-09 LAB — LIPID PANEL
CHOLESTEROL: 132 mg/dL (ref 125–200)
HDL: 37 mg/dL — AB (ref >=40)
LDL CALC: 72 mg/dL (ref <130)
TRIGLYCERIDES: 117 mg/dL (ref <150)
Total CHOL/HDL Ratio: 3.6 Ratio (ref <=5.0)
VLDL: 23 mg/dL (ref <30)

## 2016-02-09 LAB — COMPLETE METABOLIC PANEL WITH GFR
ALBUMIN: 4 g/dL (ref 3.6–5.1)
ALK PHOS: 72 U/L (ref 40–115)
ALT: 10 U/L (ref 9–46)
AST: 18 U/L (ref 10–35)
BILIRUBIN TOTAL: 0.7 mg/dL (ref 0.2–1.2)
BUN: 17 mg/dL (ref 7–25)
CALCIUM: 8.8 mg/dL (ref 8.6–10.3)
CO2: 19 mmol/L — ABNORMAL LOW (ref 20–31)
CREATININE: 1.05 mg/dL (ref 0.70–1.11)
Chloride: 103 mmol/L (ref 98–110)
GFR, EST AFRICAN AMERICAN: 74 mL/min (ref >=60)
GFR, EST NON AFRICAN AMERICAN: 64 mL/min (ref >=60)
Glucose, Bld: 102 mg/dL — ABNORMAL HIGH (ref 65–99)
Potassium: 4.4 mmol/L (ref 3.5–5.3)
Sodium: 138 mmol/L (ref 135–146)
TOTAL PROTEIN: 6.8 g/dL (ref 6.1–8.1)

## 2016-02-13 DIAGNOSIS — I6523 Occlusion and stenosis of bilateral carotid arteries: Secondary | ICD-10-CM | POA: Diagnosis not present

## 2016-02-13 DIAGNOSIS — N186 End stage renal disease: Secondary | ICD-10-CM | POA: Diagnosis not present

## 2016-02-13 DIAGNOSIS — E785 Hyperlipidemia, unspecified: Secondary | ICD-10-CM | POA: Diagnosis not present

## 2016-02-13 DIAGNOSIS — I129 Hypertensive chronic kidney disease with stage 1 through stage 4 chronic kidney disease, or unspecified chronic kidney disease: Secondary | ICD-10-CM | POA: Diagnosis not present

## 2016-02-13 DIAGNOSIS — I10 Essential (primary) hypertension: Secondary | ICD-10-CM | POA: Diagnosis not present

## 2016-02-13 DIAGNOSIS — I2510 Atherosclerotic heart disease of native coronary artery without angina pectoris: Secondary | ICD-10-CM | POA: Diagnosis not present

## 2016-02-15 DIAGNOSIS — L570 Actinic keratosis: Secondary | ICD-10-CM | POA: Diagnosis not present

## 2016-02-15 DIAGNOSIS — Z85820 Personal history of malignant melanoma of skin: Secondary | ICD-10-CM | POA: Diagnosis not present

## 2016-02-15 DIAGNOSIS — D044 Carcinoma in situ of skin of scalp and neck: Secondary | ICD-10-CM | POA: Diagnosis not present

## 2016-02-15 DIAGNOSIS — Z08 Encounter for follow-up examination after completed treatment for malignant neoplasm: Secondary | ICD-10-CM | POA: Diagnosis not present

## 2016-02-15 DIAGNOSIS — X32XXXA Exposure to sunlight, initial encounter: Secondary | ICD-10-CM | POA: Diagnosis not present

## 2016-02-15 DIAGNOSIS — D485 Neoplasm of uncertain behavior of skin: Secondary | ICD-10-CM | POA: Diagnosis not present

## 2016-02-15 DIAGNOSIS — D0461 Carcinoma in situ of skin of right upper limb, including shoulder: Secondary | ICD-10-CM | POA: Diagnosis not present

## 2016-02-15 DIAGNOSIS — L821 Other seborrheic keratosis: Secondary | ICD-10-CM | POA: Diagnosis not present

## 2016-02-15 DIAGNOSIS — C44519 Basal cell carcinoma of skin of other part of trunk: Secondary | ICD-10-CM | POA: Diagnosis not present

## 2016-03-19 ENCOUNTER — Other Ambulatory Visit: Payer: Self-pay | Admitting: Family Medicine

## 2016-03-28 ENCOUNTER — Other Ambulatory Visit: Payer: Self-pay | Admitting: Family Medicine

## 2016-04-04 DIAGNOSIS — D044 Carcinoma in situ of skin of scalp and neck: Secondary | ICD-10-CM | POA: Diagnosis not present

## 2016-04-04 DIAGNOSIS — L905 Scar conditions and fibrosis of skin: Secondary | ICD-10-CM | POA: Diagnosis not present

## 2016-04-18 DIAGNOSIS — D0461 Carcinoma in situ of skin of right upper limb, including shoulder: Secondary | ICD-10-CM | POA: Diagnosis not present

## 2016-04-18 DIAGNOSIS — C44519 Basal cell carcinoma of skin of other part of trunk: Secondary | ICD-10-CM | POA: Diagnosis not present

## 2016-05-10 ENCOUNTER — Ambulatory Visit (INDEPENDENT_AMBULATORY_CARE_PROVIDER_SITE_OTHER): Payer: PPO | Admitting: Family Medicine

## 2016-05-10 ENCOUNTER — Encounter: Payer: Self-pay | Admitting: Family Medicine

## 2016-05-10 VITALS — BP 108/73 | HR 60 | Temp 97.5°F | Resp 16 | Ht 72.0 in | Wt 196.8 lb

## 2016-05-10 DIAGNOSIS — R739 Hyperglycemia, unspecified: Secondary | ICD-10-CM | POA: Insufficient documentation

## 2016-05-10 DIAGNOSIS — E785 Hyperlipidemia, unspecified: Secondary | ICD-10-CM

## 2016-05-10 DIAGNOSIS — R202 Paresthesia of skin: Secondary | ICD-10-CM

## 2016-05-10 DIAGNOSIS — M7989 Other specified soft tissue disorders: Secondary | ICD-10-CM | POA: Diagnosis not present

## 2016-05-10 LAB — POCT GLYCOSYLATED HEMOGLOBIN (HGB A1C): HEMOGLOBIN A1C: 6

## 2016-05-10 MED ORDER — FUROSEMIDE 20 MG PO TABS: 20.0000 mg | ORAL_TABLET | Freq: Two times a day (BID) | ORAL | 0 refills | Status: DC

## 2016-05-10 MED ORDER — GABAPENTIN 400 MG PO CAPS: ORAL_CAPSULE | ORAL | 0 refills | Status: DC

## 2016-05-10 NOTE — Progress Notes (Signed)
Name: Mark [name]   MRN: [id number]    DOB: 02-08-1929   Date:05/10/2016       Progress Note  Subjective  Chief Complaint  Chief Complaint  Patient presents with  . Follow-up    3 mo    Hyperlipidemia  This is a chronic problem. The problem is controlled. Recent lipid tests were reviewed and are normal. Pertinent negatives include no leg pain, myalgias or shortness of breath. Current antihyperlipidemic treatment includes statins. Risk factors for coronary artery disease include diabetes mellitus.  Gastroesophageal Reflux  He reports no coughing, no dysphagia, no heartburn or no sore throat. This is a chronic problem. The problem occurs constantly. He has tried a PPI for the symptoms. The treatment provided significant relief.      Past Medical History:  Diagnosis Date  . Arthritis   . Carotid artery disease ([place])   . Coronary artery disease 2005  . Hyperkalemia   . Hyperlipidemia   . Hypertension   . Past heart attack 2005    Past Surgical History:  Procedure Laterality Date  . CORONARY ANGIOPLASTY WITH STENT PLACEMENT  2005   [facility]    Family History  Problem Relation Age of Onset  . Hypertension Mother   . Heart disease Mother   . Heart disease Father   . Hypertension Father     Social History   Social History  . Marital status: Married    Spouse name: N/A  . Number of children: N/A  . Years of education: N/A   Occupational History  . Not on file.   Social History Main Topics  . Smoking status: Former Smoker    Quit date: 05/16/1963  . Smokeless tobacco: Never Used     Comment: quit over 50 years ago  . Alcohol use No  . Drug use: No  . Sexual activity: Not on file   Other Topics Concern  . Not on file   Social History Narrative  . No narrative on file     Current Outpatient Prescriptions:  .  albuterol (VENTOLIN HFA) 108 (90 Base) MCG/ACT inhaler, Inhale 2 puffs into the lungs every 6 (six) hours as needed for wheezing or shortness of  breath., Disp: 1 Inhaler, Rfl: 2 .  aspirin EC 81 MG tablet, Take 81 mg by mouth., Disp: , Rfl:  .  atorvastatin (LIPITOR) 10 MG tablet, Take 1 tablet (10 mg total) by mouth daily., Disp: 90 tablet, Rfl: 3 .  clopidogrel (PLAVIX) 75 MG tablet, Take 75 mg by mouth daily., Disp: , Rfl:  .  esomeprazole (NEXIUM) 40 MG packet, Take 40 mg by mouth daily before breakfast., Disp: , Rfl:  .  fluticasone (FLONASE) 50 MCG/ACT nasal spray, Place into the nose as needed. , Disp: , Rfl:  .  furosemide (LASIX) 20 MG tablet, Take 1 tablet (20 mg total) by mouth 2 (two) times daily., Disp: 180 tablet, Rfl: 0 .  gabapentin (NEURONTIN) 400 MG capsule, Take 1 capsule (400 mg total) by mouth 2 (two) times daily., Disp: 60 capsule, Rfl: 0 .  Loratadine 10 MG CAPS, Take by mouth., Disp: , Rfl:  .  metoprolol (LOPRESSOR) 50 MG tablet, Take 1 tablet (50 mg total) by mouth 2 (two) times daily., Disp: 60 tablet, Rfl: 0 .  pantoprazole (PROTONIX) 40 MG tablet, , Disp: , Rfl:  .  Probiotic Product (PROBIOTIC PO), Take by mouth daily., Disp: , Rfl:   Allergies  Allergen Reactions  . Rosuvastatin Rash and Other (See  Comments)    No reaction per pt, He saw some information regarding Crestor and adverse effects No reaction per pt, He saw some information regarding Crestor and adverse effects No reaction per pt, He saw some information regarding Crestor and adverse effects No reaction per pt, He saw some information regarding Crestor and adverse effects No reaction per pt, He saw some information regarding Crestor and adverse effects     Review of Systems  HENT: Negative for sore throat.   Respiratory: Negative for cough and shortness of breath.   Gastrointestinal: Negative for dysphagia and heartburn.  Musculoskeletal: Negative for myalgias.     Objective  Vitals:   05/10/16 0933  BP: 108/73  Pulse: 60  Resp: 16  Temp: 97.5 F (36.4 C)  TempSrc: Oral  SpO2: 95%  Weight: 196 lb 12.8 oz (89.3 kg)  Height:  6' (1.829 m)    Physical Exam  Constitutional: He is well-developed, well-nourished, and in no distress.  HENT:  Head: Normocephalic and atraumatic.  Cardiovascular: Normal rate, regular rhythm, S1 normal, S2 normal and normal heart sounds.   No murmur heard. Pulmonary/Chest: Effort normal and breath sounds normal. He has no wheezes.  Abdominal: Soft. Bowel sounds are normal. There is no tenderness.  Musculoskeletal:       Right ankle: He exhibits swelling.       Left ankle: He exhibits swelling.  Psychiatric: Mood, memory, affect and judgment normal.  Nursing note and vitals reviewed.    Assessment & Plan  1. Leg swelling Stable taking Lasix twice daily as needed, refills provided - furosemide (LASIX) 20 MG tablet; Take 1 tablet (20 mg total) by mouth 2 (two) times daily.  Dispense: 180 tablet; Refill: 0  2. Hyperglycemia Point-of-care A1c is 6.0%, consistent with prediabetes, no pharmacotherapy indicated - POCT HgB A1C  3. Hyperlipidemia, unspecified hyperlipidemia type  - Lipid Profile - COMPLETE METABOLIC PANEL WITH GFR  4. Right leg paresthesias  - gabapentin (NEURONTIN) 400 MG capsule; Take 1 capsule (400 mg total) by mouth 2 (two) times daily.  Dispense: 60 capsule; Refill: 0   Guage Efferson [facility] 05/10/2016 9:57 AM

## 2016-05-15 DIAGNOSIS — E785 Hyperlipidemia, unspecified: Secondary | ICD-10-CM | POA: Diagnosis not present

## 2016-05-16 LAB — LIPID PANEL
CHOL/HDL RATIO: 3.1 ratio (ref <=5.0)
CHOLESTEROL: 138 mg/dL (ref 125–200)
HDL: 44 mg/dL (ref >=40)
LDL CALC: 74 mg/dL (ref <130)
Triglycerides: 102 mg/dL (ref <150)
VLDL: 20 mg/dL (ref <30)

## 2016-05-16 LAB — COMPLETE METABOLIC PANEL WITH GFR
ALT: 10 U/L (ref 9–46)
AST: 15 U/L (ref 10–35)
Albumin: 3.8 g/dL (ref 3.6–5.1)
Alkaline Phosphatase: 72 U/L (ref 40–115)
BUN: 16 mg/dL (ref 7–25)
CALCIUM: 9.1 mg/dL (ref 8.6–10.3)
CHLORIDE: 101 mmol/L (ref 98–110)
CO2: 27 mmol/L (ref 20–31)
Creat: 1.04 mg/dL (ref 0.70–1.11)
GFR, Est African American: 75 mL/min (ref >=60)
GFR, Est Non African American: 65 mL/min (ref >=60)
Glucose, Bld: 112 mg/dL — ABNORMAL HIGH (ref 65–99)
POTASSIUM: 5.1 mmol/L (ref 3.5–5.3)
SODIUM: 140 mmol/L (ref 135–146)
Total Bilirubin: 0.5 mg/dL (ref 0.2–1.2)
Total Protein: 6.6 g/dL (ref 6.1–8.1)

## 2016-05-20 ENCOUNTER — Other Ambulatory Visit: Payer: Self-pay | Admitting: Family Medicine

## 2016-06-11 ENCOUNTER — Ambulatory Visit (INDEPENDENT_AMBULATORY_CARE_PROVIDER_SITE_OTHER): Payer: PPO | Admitting: Family Medicine

## 2016-06-11 VITALS — BP 98/52 | HR 60 | Temp 97.4°F | Ht 72.0 in | Wt 202.2 lb

## 2016-06-11 DIAGNOSIS — Z0000 Encounter for general adult medical examination without abnormal findings: Secondary | ICD-10-CM

## 2016-06-11 DIAGNOSIS — Z23 Encounter for immunization: Secondary | ICD-10-CM | POA: Diagnosis not present

## 2016-06-11 NOTE — Patient Instructions (Signed)
Mark [name] , Thank you for taking time to come for your Medicare Wellness Visit. I appreciate your ongoing commitment to your health goals. Please review the following plan we discussed and let me know if I can assist you in the future.   These are the goals we discussed: Goals    . Increase water intake          Starting 06/11/16, I will increase my water intake to 5-6 glasses a day.       This is a list of the screening recommended for you and due dates:  Health Maintenance  Topic Date Due  . Shingles Vaccine  06/11/2026*  . Tetanus Vaccine  06/11/2026*  . Pneumonia vaccines (2 of 2 - PPSV23) 06/11/2017  . Flu Shot  Completed  *Topic was postponed. The date shown is not the original due date.   Preventive Care for Adults  A healthy lifestyle and preventive care can promote health and wellness. Preventive health guidelines for adults include the following key practices.  . A routine yearly physical is a good way to check with your health care provider about your health and preventive screening. It is a chance to share any concerns and updates on your health and to receive a thorough exam.  . Visit your dentist for a routine exam and preventive care every 6 months. Brush your teeth twice a day and floss once a day. Good oral hygiene prevents tooth decay and gum disease.  . The frequency of eye exams is based on your age, health, family medical history, use  of contact lenses, and other factors. Follow your health care provider's ecommendations for frequency of eye exams.  . Eat a healthy diet. Foods like vegetables, fruits, whole grains, low-fat dairy products, and lean protein foods contain the nutrients you need without too many calories. Decrease your intake of foods high in solid fats, added sugars, and salt. Eat the right amount of calories for you. Get information about a proper diet from your health care provider, if necessary.  . Regular physical exercise is one of the most  important things you can do for your health. Most adults should get at least 150 minutes of moderate-intensity exercise (any activity that increases your heart rate and causes you to sweat) each week. In addition, most adults need muscle-strengthening exercises on 2 or more days a week.  Silver Sneakers may be a benefit available to you. To determine eligibility, you may visit the website: www.silversneakers.com or contact program at 214-104-4924 Mon-Fri between 8AM-8PM.   . Maintain a healthy weight. The body mass index (BMI) is a screening tool to identify possible weight problems. It provides an estimate of body fat based on height and weight. Your health care provider can find your BMI and can help you achieve or maintain a healthy weight.   For adults 20 years and older: ? A BMI below 18.5 is considered underweight. ? A BMI of 18.5 to 24.9 is normal. ? A BMI of 25 to 29.9 is considered overweight. ? A BMI of 30 and above is considered obese.   . Maintain normal blood lipids and cholesterol levels by exercising and minimizing your intake of saturated fat. Eat a balanced diet with plenty of fruit and vegetables. Blood tests for lipids and cholesterol should begin at age 4 and be repeated every 5 years. If your lipid or cholesterol levels are high, you are over 50, or you are at high risk for heart disease, you may  need your cholesterol levels checked more frequently. Ongoing high lipid and cholesterol levels should be treated with medicines if diet and exercise are not working.  . If you smoke, find out from your health care provider how to quit. If you do not use tobacco, please do not start.  . If you choose to drink alcohol, please do not consume more than 2 drinks per day. One drink is considered to be 12 ounces (355 mL) of beer, 5 ounces (148 mL) of wine, or 1.5 ounces (44 mL) of liquor.  . If you are 65-43 years old, ask your health care provider if you should take aspirin to prevent  strokes.  . Use sunscreen. Apply sunscreen liberally and repeatedly throughout the day. You should seek shade when your shadow is shorter than you. Protect yourself by wearing long sleeves, pants, a wide-brimmed hat, and sunglasses year round, whenever you are outdoors.  . Once a month, do a whole body skin exam, using a mirror to look at the skin on your back. Tell your health care provider of new moles, moles that have irregular borders, moles that are larger than a pencil eraser, or moles that have changed in shape or color.

## 2016-06-11 NOTE — Progress Notes (Signed)
Subjective:   Mark [name] is a 82 y.o. male who presents for Medicare Annual/Subsequent preventive examination.  Review of Systems:  N/A  Cardiac Risk Factors include: advanced age (>71men, >45 women);dyslipidemia;male gender;hypertension     Objective:    Vitals: BP (!) 98/52 (BP Location: Right Arm)   Pulse 60   Temp 97.4 F (36.3 C) (Oral)   Ht 6' (1.829 m)   Wt 202 lb 4 oz (91.7 kg)   BMI 27.43 kg/m   Body mass index is 27.43 kg/m.  Tobacco History  Smoking Status  . Former Smoker  . Quit date: 05/16/1963  Smokeless Tobacco  . Never Used    Comment: quit over 50 years ago     Counseling given: Not Answered   Past Medical History:  Diagnosis Date  . Arthritis   . Carotid artery disease ([place])   . Coronary artery disease 2005  . Hyperkalemia   . Hyperlipidemia   . Hypertension   . Past heart attack 2005   Past Surgical History:  Procedure Laterality Date  . CORONARY ANGIOPLASTY WITH STENT PLACEMENT  2005   [facility]   Family History  Problem Relation Age of Onset  . Hypertension Mother   . Heart disease Mother   . Heart disease Father   . Hypertension Father    History  Sexual Activity  . Sexual activity: Not on file    Outpatient Encounter Prescriptions as of 06/11/2016  Medication Sig  . albuterol (VENTOLIN HFA) 108 (90 Base) MCG/ACT inhaler Inhale 2 puffs into the lungs every 6 (six) hours as needed for wheezing or shortness of breath.  Marland Kitchen aspirin EC 81 MG tablet Take 81 mg by mouth.  Marland Kitchen atorvastatin (LIPITOR) 10 MG tablet Take 1 tablet (10 mg total) by mouth daily.  . clopidogrel (PLAVIX) 75 MG tablet Take 75 mg by mouth daily.  . fluticasone (FLONASE) 50 MCG/ACT nasal spray Place into the nose as needed.   . furosemide (LASIX) 20 MG tablet Take 1 tablet (20 mg total) by mouth 2 (two) times daily.  Marland Kitchen gabapentin (NEURONTIN) 400 MG capsule Take 1 capsule (400 mg total) by mouth 2 (two) times daily.  . Loratadine 10 MG CAPS Take by mouth.  .  metoprolol (LOPRESSOR) 50 MG tablet Take 1 tablet (50 mg total) by mouth 2 (two) times daily.  . Probiotic Product (PROBIOTIC PO) Take by mouth daily.  . pantoprazole (PROTONIX) 40 MG tablet    No facility-administered encounter medications on file as of 06/11/2016.     Activities of Daily Living In your present state of health, do you have any difficulty performing the following activities: 06/11/2016 05/10/2016  Hearing? Y N  Vision? N Y  Difficulty concentrating or making decisions? Y N  Walking or climbing stairs? N N  Dressing or bathing? N N  Doing errands, shopping? N N  Preparing Food and eating ? N -  Using the Toilet? N -  In the past six months, have you accidently leaked urine? N -  Do you have problems with loss of bowel control? N -  Managing your Medications? N -  Managing your Finances? N -  Housekeeping or managing your Housekeeping? N -  Some recent data might be hidden    Patient Care Team: Roselee Nova, MD as PCP - General (Family Medicine) Jeni Salles, MD as Consulting Physician (Ophthalmology) Kennieth Francois, MD as Consulting Physician (Dermatology)   Assessment:     Exercise Activities and  Dietary recommendations Current Exercise Habits: The patient does not participate in regular exercise at present (yard work on weekends), Exercise limited by: None identified  Goals    . Increase water intake          Starting 06/11/16, I will increase my water intake to 5-6 glasses a day.      Fall Risk Fall Risk  06/11/2016 05/10/2016 02/08/2016 10/11/2015 09/13/2015  Falls in the past year? No No No No No   Depression Screen PHQ 2/9 Scores 06/11/2016 05/10/2016 02/08/2016 10/11/2015  PHQ - 2 Score 0 0 0 0    Cognitive Function     6CIT Screen 06/11/2016  What Year? 0 points  What month? 0 points  What time? 0 points  Count back from 20 0 points  Months in reverse 0 points  Repeat phrase 2 points  Total Score 2    Immunization History    Administered Date(s) Administered  . Influenza, High Dose Seasonal PF 03/29/2015  . Pneumococcal Conjugate-13 06/11/2016   Screening Tests Health Maintenance  Topic Date Due  . ZOSTAVAX  06/11/2026 (Originally 10/07/1990)  . TETANUS/TDAP  06/11/2026 (Originally [birth year]06/1949)  . PNA vac Low Risk Adult (2 of 2 - PPSV23) 06/11/2017  . INFLUENZA VACCINE  Completed      Plan:  I have personally reviewed and addressed the Medicare Annual Wellness questionnaire and have noted the following in the patient's chart:  A. Medical and social history B. Use of alcohol, tobacco or illicit drugs  C. Current medications and supplements D. Functional ability and status E.  Nutritional status F.  Physical activity G. Advance directives H. List of other physicians I.  Hospitalizations, surgeries, and ER visits in previous 12 months J.  [place] such as hearing and vision if needed, cognitive and depression L. Referrals and appointments - none  In addition, I have reviewed and discussed with patient certain preventive protocols, quality metrics, and best practice recommendations. A written personalized care plan for preventive services as well as general preventive health recommendations were provided to patient.  See attached scanned questionnaire for additional information.   Signed,  Fabio Neighbors, LPN Nurse Health Advisor

## 2016-06-15 ENCOUNTER — Ambulatory Visit (INDEPENDENT_AMBULATORY_CARE_PROVIDER_SITE_OTHER): Payer: PPO

## 2016-06-15 DIAGNOSIS — Z23 Encounter for immunization: Secondary | ICD-10-CM

## 2016-06-18 ENCOUNTER — Other Ambulatory Visit: Payer: Self-pay | Admitting: Family Medicine

## 2016-06-20 ENCOUNTER — Other Ambulatory Visit: Payer: Self-pay | Admitting: Family Medicine

## 2016-06-25 ENCOUNTER — Telehealth: Payer: Self-pay

## 2016-06-25 DIAGNOSIS — R202 Paresthesia of skin: Secondary | ICD-10-CM

## 2016-06-25 MED ORDER — METOPROLOL TARTRATE 50 MG PO TABS: ORAL_TABLET | ORAL | 1 refills | Status: DC

## 2016-06-25 MED ORDER — GABAPENTIN 400 MG PO CAPS: ORAL_CAPSULE | ORAL | 1 refills | Status: DC

## 2016-06-25 NOTE — Telephone Encounter (Signed)
Medication has been refilled and sent to [facility]

## 2016-07-02 DIAGNOSIS — H2513 Age-related nuclear cataract, bilateral: Secondary | ICD-10-CM | POA: Diagnosis not present

## 2016-07-18 DIAGNOSIS — D485 Neoplasm of uncertain behavior of skin: Secondary | ICD-10-CM | POA: Diagnosis not present

## 2016-07-18 DIAGNOSIS — L570 Actinic keratosis: Secondary | ICD-10-CM | POA: Diagnosis not present

## 2016-07-18 DIAGNOSIS — X32XXXA Exposure to sunlight, initial encounter: Secondary | ICD-10-CM | POA: Diagnosis not present

## 2016-07-18 DIAGNOSIS — C44622 Squamous cell carcinoma of skin of right upper limb, including shoulder: Secondary | ICD-10-CM | POA: Diagnosis not present

## 2016-07-18 DIAGNOSIS — Z85828 Personal history of other malignant neoplasm of skin: Secondary | ICD-10-CM | POA: Diagnosis not present

## 2016-07-18 DIAGNOSIS — Z85820 Personal history of malignant melanoma of skin: Secondary | ICD-10-CM | POA: Diagnosis not present

## 2016-08-13 ENCOUNTER — Ambulatory Visit: Payer: PPO | Admitting: General Surgery

## 2016-08-22 ENCOUNTER — Other Ambulatory Visit: Payer: Self-pay | Admitting: Family Medicine

## 2016-08-22 DIAGNOSIS — M7989 Other specified soft tissue disorders: Secondary | ICD-10-CM

## 2016-08-27 ENCOUNTER — Ambulatory Visit (INDEPENDENT_AMBULATORY_CARE_PROVIDER_SITE_OTHER): Payer: PPO | Admitting: Family Medicine

## 2016-08-27 ENCOUNTER — Encounter: Payer: Self-pay | Admitting: Family Medicine

## 2016-08-27 DIAGNOSIS — E785 Hyperlipidemia, unspecified: Secondary | ICD-10-CM | POA: Diagnosis not present

## 2016-08-27 DIAGNOSIS — R062 Wheezing: Secondary | ICD-10-CM | POA: Diagnosis not present

## 2016-08-27 DIAGNOSIS — I10 Essential (primary) hypertension: Secondary | ICD-10-CM | POA: Diagnosis not present

## 2016-08-27 DIAGNOSIS — M7989 Other specified soft tissue disorders: Secondary | ICD-10-CM | POA: Diagnosis not present

## 2016-08-27 DIAGNOSIS — Z8669 Personal history of other diseases of the nervous system and sense organs: Secondary | ICD-10-CM | POA: Insufficient documentation

## 2016-08-27 DIAGNOSIS — K219 Gastro-esophageal reflux disease without esophagitis: Secondary | ICD-10-CM

## 2016-08-27 DIAGNOSIS — R202 Paresthesia of skin: Secondary | ICD-10-CM | POA: Diagnosis not present

## 2016-08-27 MED ORDER — NEOMYCIN-POLYMYXIN-HC 3.5-10000-1 OT SOLN: 3.0000 [drp] | Freq: Every evening | OTIC | 0 refills | Status: DC | PRN

## 2016-08-27 MED ORDER — ATORVASTATIN CALCIUM 10 MG PO TABS: 10.0000 mg | ORAL_TABLET | Freq: Every day | ORAL | 0 refills | Status: DC

## 2016-08-27 MED ORDER — FUROSEMIDE 20 MG PO TABS: 20.0000 mg | ORAL_TABLET | Freq: Two times a day (BID) | ORAL | 0 refills | Status: DC

## 2016-08-27 MED ORDER — METOPROLOL TARTRATE 50 MG PO TABS: ORAL_TABLET | ORAL | 1 refills | Status: DC

## 2016-08-27 MED ORDER — PANTOPRAZOLE SODIUM 40 MG PO TBEC: 40.0000 mg | DELAYED_RELEASE_TABLET | Freq: Every day | ORAL | 0 refills | Status: DC

## 2016-08-27 MED ORDER — ALBUTEROL SULFATE HFA 108 (90 BASE) MCG/ACT IN AERS: 2.0000 | INHALATION_SPRAY | Freq: Four times a day (QID) | RESPIRATORY_TRACT | 2 refills | Status: DC | PRN

## 2016-08-27 MED ORDER — GABAPENTIN 400 MG PO CAPS
ORAL_CAPSULE | ORAL | 1 refills | Status: DC
Start: 2016-08-27 — End: 2016-11-26

## 2016-08-27 NOTE — Progress Notes (Signed)
Name: Mark [name]   MRN: [id number]    DOB: 04-02-1929   Date:08/27/2016       Progress Note  Subjective  Chief Complaint  Chief Complaint  Patient presents with  . Medication Refill    pt is requesting 90 day prescriptions    Hyperlipidemia  This is a chronic problem. The problem is controlled. Recent lipid tests were reviewed and are normal. Pertinent negatives include no chest pain, leg pain, myalgias or shortness of breath. Current antihyperlipidemic treatment includes statins.  Gastroesophageal Reflux  He reports no abdominal pain, no belching, no chest pain, no choking, no coughing, no heartburn or no sore throat. This is a chronic problem. The symptoms are aggravated by certain foods. He has tried a PPI for the symptoms.  Hypertension  This is a chronic problem. The problem is unchanged. The problem is controlled. Pertinent negatives include no blurred vision, chest pain, headaches, orthopnea, palpitations or shortness of breath. Risk factors for coronary artery disease include dyslipidemia and obesity. Past treatments include beta blockers.   Patient is also requesting refills for Neomycin/Polymyxin/Hydrocortisone 1% otic suspension. He reports that every 2-3 months, he has pain in the right or left ear whaich leads to a headache and that he uses three drops of the above-mentioned medication at night which resolves his headache. He has used the suspension for years, now running out.     Past Medical History:  Diagnosis Date  . Arthritis   . Carotid artery disease ([place])   . Coronary artery disease 2005  . Hyperkalemia   . Hyperlipidemia   . Hypertension   . Past heart attack 2005    Past Surgical History:  Procedure Laterality Date  . CORONARY ANGIOPLASTY WITH STENT PLACEMENT  2005   [facility]    Family History  Problem Relation Age of Onset  . Hypertension Mother   . Heart disease Mother   . Heart disease Father   . Hypertension Father     Social History    Social History  . Marital status: Married    Spouse name: N/A  . Number of children: N/A  . Years of education: N/A   Occupational History  . Not on file.   Social History Main Topics  . Smoking status: Former Smoker    Quit date: 05/16/1963  . Smokeless tobacco: Never Used     Comment: quit over 50 years ago  . Alcohol use No  . Drug use: No  . Sexual activity: Not on file   Other Topics Concern  . Not on file   Social History Narrative  . No narrative on file     Current Outpatient Prescriptions:  .  albuterol (VENTOLIN HFA) 108 (90 Base) MCG/ACT inhaler, Inhale 2 puffs into the lungs every 6 (six) hours as needed for wheezing or shortness of breath., Disp: 1 Inhaler, Rfl: 2 .  aspirin EC 81 MG tablet, Take 81 mg by mouth., Disp: , Rfl:  .  atorvastatin (LIPITOR) 10 MG tablet, Take 1 tablet (10 mg total) by mouth daily., Disp: 90 tablet, Rfl: 3 .  clopidogrel (PLAVIX) 75 MG tablet, Take 75 mg by mouth daily., Disp: , Rfl:  .  fluticasone (FLONASE) 50 MCG/ACT nasal spray, Place into the nose as needed. , Disp: , Rfl:  .  furosemide (LASIX) 20 MG tablet, Take 1 tablet (20 mg total) by mouth 2 (two) times daily., Disp: 180 tablet, Rfl: 0 .  gabapentin (NEURONTIN) 400 MG capsule, Take 1 capsule (400  mg total) by mouth 2 (two) times daily., Disp: 60 capsule, Rfl: 1 .  Loratadine 10 MG CAPS, Take by mouth., Disp: , Rfl:  .  metoprolol (LOPRESSOR) 50 MG tablet, Take 1 tablet (50 mg total) by mouth 2 (two) times daily., Disp: 60 tablet, Rfl: 1 .  pantoprazole (PROTONIX) 40 MG tablet, , Disp: , Rfl:  .  Probiotic Product (PROBIOTIC PO), Take by mouth daily., Disp: , Rfl:   Allergies  Allergen Reactions  . Rosuvastatin Rash and Other (See Comments)    No reaction per pt, He saw some information regarding Crestor and adverse effects No reaction per pt, He saw some information regarding Crestor and adverse effects No reaction per pt, He saw some information regarding Crestor and  adverse effects No reaction per pt, He saw some information regarding Crestor and adverse effects No reaction per pt, He saw some information regarding Crestor and adverse effects     Review of Systems  HENT: Negative for sore throat.   Eyes: Negative for blurred vision.  Respiratory: Negative for cough, choking and shortness of breath.   Cardiovascular: Negative for chest pain, palpitations and orthopnea.  Gastrointestinal: Negative for abdominal pain and heartburn.  Musculoskeletal: Negative for myalgias.  Neurological: Negative for headaches.    Objective  Vitals:   08/27/16 1517  BP: 106/63  Pulse: 77  Resp: 16  Temp: 97.7 F (36.5 C)  TempSrc: Oral  SpO2: 96%  Weight: 200 lb 8 oz (90.9 kg)  Height: 6' (1.829 m)    Physical Exam  Constitutional: He is oriented to person, place, and time and well-developed, well-nourished, and in no distress.  HENT:  Head: Normocephalic and atraumatic.  Cardiovascular: Normal rate, regular rhythm, S1 normal, S2 normal and normal heart sounds.   No murmur heard. Pulmonary/Chest: Effort normal and breath sounds normal. He has no wheezes.  Abdominal: Soft. Bowel sounds are normal. There is no tenderness.  Musculoskeletal:       Right ankle: He exhibits swelling.       Left ankle: He exhibits swelling.  Trace bilateral ankle edema  Neurological: He is alert and oriented to person, place, and time.  Psychiatric: Mood, memory, affect and judgment normal.  Nursing note and vitals reviewed.    Assessment & Plan  1. Hyperlipidemia, unspecified hyperlipidemia type Continue on atorvastatin as prescribed, FLP reviewed - atorvastatin (LIPITOR) 10 MG tablet; Take 1 tablet (10 mg total) by mouth daily.  Dispense: 90 tablet; Refill: 0  2. Leg swelling Taking Lasix 20 mg twice daily for relief of leg swelling - furosemide (LASIX) 20 MG tablet; Take 1 tablet (20 mg total) by mouth 2 (two) times daily.  Dispense: 180 tablet; Refill: 0  3.  Essential hypertension Stable on present antihypertensive therapy - metoprolol (LOPRESSOR) 50 MG tablet; Take 1 tablet (50 mg total) by mouth 2 (two) times daily.  Dispense: 180 tablet; Refill: 1  4. Gastroesophageal reflux disease, esophagitis presence not specified Symptoms responsive to pantoprazole taken daily - pantoprazole (PROTONIX) 40 MG tablet; Take 1 tablet (40 mg total) by mouth daily.  Dispense: 90 tablet; Refill: 0  5. Right leg paresthesias  - gabapentin (NEURONTIN) 400 MG capsule; Take 1 capsule (400 mg total) by mouth 2 (two) times daily.  Dispense: 180 capsule; Refill: 1  6. Wheezing on auscultation Takes albuterol inhaler on occasion, requesting refills. - albuterol (VENTOLIN HFA) 108 (90 Base) MCG/ACT inhaler; Inhale 2 puffs into the lungs every 6 (six) hours as needed for wheezing or  shortness of breath.  Dispense: 1 Inhaler; Refill: 2  7. History of ear pain  - neomycin-polymyxin-hydrocortisone (CORTISPORIN) otic solution; Place 3 drops into both ears at bedtime as needed.  Dispense: 10 mL; Refill: 0   Shamanda Len Asad A. [facility] 08/27/2016 3:25 PM

## 2016-08-28 DIAGNOSIS — X32XXXA Exposure to sunlight, initial encounter: Secondary | ICD-10-CM | POA: Diagnosis not present

## 2016-08-28 DIAGNOSIS — D0461 Carcinoma in situ of skin of right upper limb, including shoulder: Secondary | ICD-10-CM | POA: Diagnosis not present

## 2016-08-28 DIAGNOSIS — L570 Actinic keratosis: Secondary | ICD-10-CM | POA: Diagnosis not present

## 2016-10-15 ENCOUNTER — Other Ambulatory Visit (INDEPENDENT_AMBULATORY_CARE_PROVIDER_SITE_OTHER): Payer: Self-pay | Admitting: Vascular Surgery

## 2016-10-22 ENCOUNTER — Other Ambulatory Visit: Payer: Self-pay | Admitting: Nurse Practitioner

## 2016-10-22 DIAGNOSIS — R1310 Dysphagia, unspecified: Secondary | ICD-10-CM | POA: Diagnosis not present

## 2016-10-22 DIAGNOSIS — R1319 Other dysphagia: Secondary | ICD-10-CM

## 2016-10-22 DIAGNOSIS — K219 Gastro-esophageal reflux disease without esophagitis: Secondary | ICD-10-CM | POA: Diagnosis not present

## 2016-10-29 ENCOUNTER — Other Ambulatory Visit: Payer: Self-pay | Admitting: Nurse Practitioner

## 2016-10-29 ENCOUNTER — Ambulatory Visit
Admission: RE | Admit: 2016-10-29 | Discharge: 2016-10-29 | Disposition: A | Discharge status: Home / Self Care | Payer: PPO | Source: Ambulatory Visit | Attending: Nurse Practitioner | Admitting: Nurse Practitioner

## 2016-10-29 DIAGNOSIS — R1319 Other dysphagia: Secondary | ICD-10-CM

## 2016-10-29 DIAGNOSIS — R1310 Dysphagia, unspecified: Secondary | ICD-10-CM | POA: Diagnosis not present

## 2016-10-29 DIAGNOSIS — K219 Gastro-esophageal reflux disease without esophagitis: Secondary | ICD-10-CM | POA: Insufficient documentation

## 2016-10-29 DIAGNOSIS — K5710 Diverticulosis of small intestine without perforation or abscess without bleeding: Secondary | ICD-10-CM | POA: Diagnosis not present

## 2016-11-26 ENCOUNTER — Ambulatory Visit (INDEPENDENT_AMBULATORY_CARE_PROVIDER_SITE_OTHER): Payer: PPO | Admitting: Family Medicine

## 2016-11-26 VITALS — BP 118/64 | HR 65 | Temp 97.8°F | Resp 16 | Ht 72.0 in | Wt 202.5 lb

## 2016-11-26 DIAGNOSIS — R202 Paresthesia of skin: Secondary | ICD-10-CM | POA: Diagnosis not present

## 2016-11-26 DIAGNOSIS — E7800 Pure hypercholesterolemia, unspecified: Secondary | ICD-10-CM

## 2016-11-26 DIAGNOSIS — K219 Gastro-esophageal reflux disease without esophagitis: Secondary | ICD-10-CM | POA: Diagnosis not present

## 2016-11-26 DIAGNOSIS — I10 Essential (primary) hypertension: Secondary | ICD-10-CM

## 2016-11-26 MED ORDER — GABAPENTIN 400 MG PO CAPS: ORAL_CAPSULE | ORAL | 1 refills | Status: DC

## 2016-11-26 MED ORDER — ATORVASTATIN CALCIUM 10 MG PO TABS: 10.0000 mg | ORAL_TABLET | Freq: Every day | ORAL | 0 refills | Status: DC

## 2016-11-26 MED ORDER — METOPROLOL TARTRATE 50 MG PO TABS: ORAL_TABLET | ORAL | 1 refills | Status: DC

## 2016-11-26 MED ORDER — PANTOPRAZOLE SODIUM 40 MG PO TBEC: 40.0000 mg | DELAYED_RELEASE_TABLET | Freq: Every day | ORAL | 0 refills | Status: DC

## 2016-11-26 NOTE — Progress Notes (Signed)
Name: Mark [name]   MRN: [id number]    DOB: 18-Sep-1928   Date:11/26/2016       Progress Note  Subjective  Chief Complaint  Chief Complaint  Patient presents with  . Hyperlipidemia    3 month follow up  . Hypertension    Hyperlipidemia  This is a chronic problem. The problem is controlled. Recent lipid tests were reviewed and are normal. Pertinent negatives include no chest pain, leg pain (has intermittent right leg paresthesias.), myalgias or shortness of breath. Current antihyperlipidemic treatment includes statins.  Hypertension  This is a chronic problem. The problem is unchanged. The problem is controlled. Pertinent negatives include no blurred vision, chest pain, headaches, orthopnea, palpitations, shortness of breath or sweats. Hypertensive end-organ damage includes CAD/MI. There is no history of kidney disease or CVA.   Patient has persistent right leg and right foot burning sensation, present all the times, no clear exacerbating factors, he is on Gabapentin 400 mg twice a day which seems to help. 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    Past Medical History:  Diagnosis Date  . Arthritis   . Carotid artery disease ([place])   . Coronary artery disease 2005  . Hyperkalemia   . Hyperlipidemia   . Hypertension   . Past heart attack 2005    Past Surgical History:  Procedure Laterality Date  . CORONARY ANGIOPLASTY WITH STENT PLACEMENT  2005   [facility]    Family History  Problem Relation Age of Onset  . Hypertension Mother   . Heart disease Mother   . Heart disease Father   . Hypertension Father     Social History   Social History  . Marital status: Married    Spouse name: N/A  . Number of children: N/A  . Years of education: N/A   Occupational History  . Not on file.   Social History Main Topics  . Smoking status: Former Smoker    Quit date: 05/16/1963  . Smokeless tobacco:  Never Used     Comment: quit over 50 years ago  . Alcohol use No  . Drug use: No  . Sexual activity: Not on file   Other Topics Concern  . Not on file   Social History Narrative  . No narrative on file     Current Outpatient Prescriptions:  .  acetaminophen (TYLENOL) 500 MG tablet, Take by mouth., Disp: , Rfl:  .  albuterol (VENTOLIN HFA) 108 (90 Base) MCG/ACT inhaler, Inhale 2 puffs into the lungs every 6 (six) hours as needed for wheezing or shortness of breath., Disp: 1 Inhaler, Rfl: 2 .  aspirin EC 81 MG tablet, Take 81 mg by mouth., Disp: , Rfl:  .  atorvastatin (LIPITOR) 10 MG tablet, Take 1 tablet (10 mg total) by mouth daily., Disp: 90 tablet, Rfl: 0 .  clopidogrel (PLAVIX) 75 MG tablet, 1 (one) Tablet, Oral, daily, Disp: 30 tablet, Rfl: 0 .  fluticasone (FLONASE) 50 MCG/ACT nasal spray, Place into the nose as needed. , Disp: , Rfl:  .  furosemide (LASIX) 20 MG tablet, Take 1 tablet (20 mg total) by mouth 2 (two) times daily., Disp: 180 tablet, Rfl: 0 .  gabapentin (NEURONTIN) 400 MG capsule, Take 1 capsule (400 mg total) by mouth 2 (two) times daily., Disp: 180 capsule, Rfl: 1 .  Loratadine 10 MG CAPS, Take by mouth., Disp: , Rfl:  .  metoprolol (LOPRESSOR) 50 MG tablet, Take 1 tablet (50 mg total) by mouth 2 (two) times daily., Disp: 180 tablet, Rfl: 1 .  neomycin-polymyxin-hydrocortisone (CORTISPORIN) otic solution, Place 3 drops into both ears at bedtime as needed., Disp: 10 mL, Rfl: 0 .  pantoprazole (PROTONIX) 40 MG tablet, Take 1 tablet (40 mg total) by mouth daily., Disp: 90 tablet, Rfl: 0 .  Probiotic Product (PROBIOTIC PO), Take by mouth daily., Disp: , Rfl:   Allergies  Allergen Reactions  . Rosuvastatin Rash and Other (See Comments)    No reaction per pt, He saw some information regarding Crestor and adverse effects No reaction per pt, He saw some information regarding Crestor and adverse effects No reaction per pt, He saw some information regarding Crestor and  adverse effects No reaction per pt, He saw some information regarding Crestor and adverse effects No reaction per pt, He saw some information regarding Crestor and adverse effects     Review of Systems  Eyes: Negative for blurred vision.  Respiratory: Negative for shortness of breath.   Cardiovascular: Negative for chest pain, palpitations and orthopnea.  Musculoskeletal: Negative for myalgias.  Neurological: Negative for headaches.    Objective  Vitals:   11/26/16 1026  BP: 118/64  Pulse: 65  Resp: 16  Temp: 97.8 F (36.6 C)  SpO2: 98%  Weight: 202 lb 8 oz (91.9 kg)  Height: 6' (1.829 m)    Physical Exam  Constitutional: He is oriented to person, place, and time and well-developed, well-nourished, and in no distress.  HENT:  Head: Normocephalic and atraumatic.  Cardiovascular: Normal rate, regular rhythm and normal heart sounds.   No murmur heard. Pulmonary/Chest: Effort normal and breath sounds normal. He has no wheezes.  Abdominal: Soft. Bowel sounds are normal. There is no tenderness.  Musculoskeletal: He exhibits edema.  Neurological: He is alert and oriented to person, place, and time.  Psychiatric: Mood, memory, affect and judgment normal.  Nursing note and vitals reviewed.     Assessment & Plan  1. Right leg paresthesias Unclear etiology, referral to neurology for management, continue on gabapentin - gabapentin (NEURONTIN) 400 MG capsule; Take 1 capsule (400 mg total) by mouth 2 (two) times daily.  Dispense: 180 capsule; Refill: 1 - Ambulatory referral to Neurology  2. Essential hypertension BP stable on present antihypertensive therapy - metoprolol (LOPRESSOR) 50 MG tablet; Take 1 tablet (50 mg total) by mouth 2 (two) times daily.  Dispense: 180 tablet; Refill: 1  3. Pure hypercholesterolemia  - atorvastatin (LIPITOR) 10 MG tablet; Take 1 tablet (10 mg total) by mouth daily.  Dispense: 90 tablet; Refill: 0 - Lipid panel  4. Gastroesophageal reflux  disease, esophagitis presence not specified  - pantoprazole (PROTONIX) 40 MG tablet; Take 1 tablet (40 mg total) by mouth daily.  Dispense: 90 tablet; Refill: 0     Raghad Lorenz Asad A. [facility] 11/26/2016 10:41 AM

## 2016-11-30 DIAGNOSIS — E7800 Pure hypercholesterolemia, unspecified: Secondary | ICD-10-CM | POA: Diagnosis not present

## 2016-12-01 LAB — LIPID PANEL
CHOL/HDL RATIO: 3.9 ratio (ref <5.0)
CHOLESTEROL: 128 mg/dL (ref <200)
HDL: 33 mg/dL — AB (ref >40)
LDL Cholesterol: 74 mg/dL (ref <100)
TRIGLYCERIDES: 106 mg/dL (ref <150)
VLDL: 21 mg/dL (ref <30)

## 2016-12-10 DIAGNOSIS — D044 Carcinoma in situ of skin of scalp and neck: Secondary | ICD-10-CM | POA: Diagnosis not present

## 2016-12-10 DIAGNOSIS — C44629 Squamous cell carcinoma of skin of left upper limb, including shoulder: Secondary | ICD-10-CM | POA: Diagnosis not present

## 2016-12-10 DIAGNOSIS — L570 Actinic keratosis: Secondary | ICD-10-CM | POA: Diagnosis not present

## 2016-12-10 DIAGNOSIS — Z85820 Personal history of malignant melanoma of skin: Secondary | ICD-10-CM | POA: Diagnosis not present

## 2016-12-10 DIAGNOSIS — L821 Other seborrheic keratosis: Secondary | ICD-10-CM | POA: Diagnosis not present

## 2016-12-10 DIAGNOSIS — Z85828 Personal history of other malignant neoplasm of skin: Secondary | ICD-10-CM | POA: Diagnosis not present

## 2016-12-10 DIAGNOSIS — D485 Neoplasm of uncertain behavior of skin: Secondary | ICD-10-CM | POA: Diagnosis not present

## 2016-12-10 DIAGNOSIS — X32XXXA Exposure to sunlight, initial encounter: Secondary | ICD-10-CM | POA: Diagnosis not present

## 2016-12-10 DIAGNOSIS — L905 Scar conditions and fibrosis of skin: Secondary | ICD-10-CM | POA: Diagnosis not present

## 2016-12-10 DIAGNOSIS — Z08 Encounter for follow-up examination after completed treatment for malignant neoplasm: Secondary | ICD-10-CM | POA: Diagnosis not present

## 2016-12-17 ENCOUNTER — Other Ambulatory Visit (INDEPENDENT_AMBULATORY_CARE_PROVIDER_SITE_OTHER): Payer: Self-pay | Admitting: Vascular Surgery

## 2017-01-10 DIAGNOSIS — M3501 Sicca syndrome with keratoconjunctivitis: Secondary | ICD-10-CM | POA: Diagnosis not present

## 2017-01-28 ENCOUNTER — Other Ambulatory Visit: Payer: Self-pay | Admitting: Neurology

## 2017-01-28 DIAGNOSIS — I82401 Acute embolism and thrombosis of unspecified deep veins of right lower extremity: Secondary | ICD-10-CM | POA: Diagnosis not present

## 2017-01-28 DIAGNOSIS — G629 Polyneuropathy, unspecified: Secondary | ICD-10-CM | POA: Insufficient documentation

## 2017-01-28 DIAGNOSIS — I82409 Acute embolism and thrombosis of unspecified deep veins of unspecified lower extremity: Secondary | ICD-10-CM

## 2017-01-29 ENCOUNTER — Other Ambulatory Visit: Payer: Self-pay | Admitting: Neurology

## 2017-01-29 DIAGNOSIS — M79604 Pain in right leg: Secondary | ICD-10-CM

## 2017-01-29 DIAGNOSIS — I82409 Acute embolism and thrombosis of unspecified deep veins of unspecified lower extremity: Secondary | ICD-10-CM

## 2017-01-31 ENCOUNTER — Ambulatory Visit
Admission: RE | Admit: 2017-01-31 | Discharge: 2017-01-31 | Disposition: A | Discharge status: Home / Self Care | Payer: PPO | Source: Ambulatory Visit | Attending: Neurology | Admitting: Neurology

## 2017-01-31 DIAGNOSIS — M7989 Other specified soft tissue disorders: Secondary | ICD-10-CM | POA: Diagnosis not present

## 2017-01-31 DIAGNOSIS — I82401 Acute embolism and thrombosis of unspecified deep veins of right lower extremity: Secondary | ICD-10-CM | POA: Insufficient documentation

## 2017-01-31 DIAGNOSIS — M79604 Pain in right leg: Secondary | ICD-10-CM

## 2017-01-31 DIAGNOSIS — I82409 Acute embolism and thrombosis of unspecified deep veins of unspecified lower extremity: Secondary | ICD-10-CM

## 2017-02-07 DIAGNOSIS — D0462 Carcinoma in situ of skin of left upper limb, including shoulder: Secondary | ICD-10-CM | POA: Diagnosis not present

## 2017-02-07 DIAGNOSIS — L905 Scar conditions and fibrosis of skin: Secondary | ICD-10-CM | POA: Diagnosis not present

## 2017-02-07 DIAGNOSIS — C44629 Squamous cell carcinoma of skin of left upper limb, including shoulder: Secondary | ICD-10-CM | POA: Diagnosis not present

## 2017-02-14 ENCOUNTER — Ambulatory Visit (INDEPENDENT_AMBULATORY_CARE_PROVIDER_SITE_OTHER): Payer: PPO | Admitting: Vascular Surgery

## 2017-02-14 ENCOUNTER — Other Ambulatory Visit (INDEPENDENT_AMBULATORY_CARE_PROVIDER_SITE_OTHER): Payer: Self-pay | Admitting: Vascular Surgery

## 2017-02-14 ENCOUNTER — Encounter (INDEPENDENT_AMBULATORY_CARE_PROVIDER_SITE_OTHER): Payer: Self-pay | Admitting: Vascular Surgery

## 2017-02-14 ENCOUNTER — Ambulatory Visit (INDEPENDENT_AMBULATORY_CARE_PROVIDER_SITE_OTHER): Payer: PPO

## 2017-02-14 VITALS — BP 125/64 | HR 51 | Resp 16 | Wt 200.0 lb

## 2017-02-14 DIAGNOSIS — K219 Gastro-esophageal reflux disease without esophagitis: Secondary | ICD-10-CM

## 2017-02-14 DIAGNOSIS — M7989 Other specified soft tissue disorders: Secondary | ICD-10-CM

## 2017-02-14 DIAGNOSIS — I10 Essential (primary) hypertension: Secondary | ICD-10-CM | POA: Diagnosis not present

## 2017-02-14 DIAGNOSIS — I2510 Atherosclerotic heart disease of native coronary artery without angina pectoris: Secondary | ICD-10-CM

## 2017-02-14 DIAGNOSIS — I6523 Occlusion and stenosis of bilateral carotid arteries: Secondary | ICD-10-CM

## 2017-02-17 NOTE — Progress Notes (Signed)
MRN : [id number]  Mark [name] is a 83 y.o. (1929-02-01) male who presents with chief complaint of  Chief Complaint  Patient presents with  . Follow-up  .  History of Present Illness: The patient is seen for follow up evaluation of carotid stenosis. The carotid stenosis followed by ultrasound.   The patient denies amaurosis fugax. There is no recent history of TIA symptoms or focal motor deficits. There is no prior documented CVA.  The patient is taking enteric-coated aspirin 81 mg daily.  There is no history of migraine headaches. There is no history of seizures.  The patient has a history of coronary artery disease, no recent episodes of angina or shortness of breath. The patient denies PAD or claudication symptoms. There is a history of hyperlipidemia which is being treated with a statin.     Current Meds  Medication Sig  . acetaminophen (TYLENOL) 500 MG tablet Take by mouth.  Marland Kitchen albuterol (VENTOLIN HFA) 108 (90 Base) MCG/ACT inhaler Inhale 2 puffs into the lungs every 6 (six) hours as needed for wheezing or shortness of breath.  Marland Kitchen aspirin EC 81 MG tablet Take 81 mg by mouth.  Marland Kitchen atorvastatin (LIPITOR) 10 MG tablet Take 1 tablet (10 mg total) by mouth daily.  . clopidogrel (PLAVIX) 75 MG tablet 1 (one) Tablet, Oral, daily  . fluticasone (FLONASE) 50 MCG/ACT nasal spray Place into the nose as needed.   . furosemide (LASIX) 20 MG tablet Take 1 tablet (20 mg total) by mouth 2 (two) times daily.  Marland Kitchen gabapentin (NEURONTIN) 400 MG capsule Take 1 capsule (400 mg total) by mouth 2 (two) times daily.  . Loratadine 10 MG CAPS Take by mouth.  . metoprolol (LOPRESSOR) 50 MG tablet Take 1 tablet (50 mg total) by mouth 2 (two) times daily.  Marland Kitchen neomycin-polymyxin-hydrocortisone (CORTISPORIN) otic solution Place 3 drops into both ears at bedtime as needed.  . pantoprazole (PROTONIX) 40 MG tablet Take 1 tablet (40 mg total) by mouth daily.  . Probiotic Product (PROBIOTIC PO) Take by mouth  daily.    Past Medical History:  Diagnosis Date  . Arthritis   . Carotid artery disease ([place])   . Coronary artery disease 2005  . Hyperkalemia   . Hyperlipidemia   . Hypertension   . Past heart attack 2005    Past Surgical History:  Procedure Laterality Date  . CORONARY ANGIOPLASTY WITH STENT PLACEMENT  2005   [facility]    Social History Social History  Substance Use Topics  . Smoking status: Former Smoker    Quit date: 05/16/1963  . Smokeless tobacco: Never Used     Comment: quit over 50 years ago  . Alcohol use No    Family History Family History  Problem Relation Age of Onset  . Hypertension Mother   . Heart disease Mother   . Heart disease Father   . Hypertension Father     Allergies  Allergen Reactions  . Rosuvastatin Rash and Other (See Comments)    No reaction per pt, He saw some information regarding Crestor and adverse effects No reaction per pt, He saw some information regarding Crestor and adverse effects No reaction per pt, He saw some information regarding Crestor and adverse effects No reaction per pt, He saw some information regarding Crestor and adverse effects No reaction per pt, He saw some information regarding Crestor and adverse effects     REVIEW OF SYSTEMS (Negative unless checked)  Constitutional: [] Weight loss  [] Fever  [] Chills  Cardiac: [] Chest pain   [] Chest pressure   [] Palpitations   [] Shortness of breath when laying flat   [] Shortness of breath with exertion. Vascular:  [] Pain in legs with walking   [] Pain in legs at rest  [] History of DVT   [] Phlebitis   [] Swelling in legs   [] Varicose veins   [] Non-healing ulcers Pulmonary:   [] Uses home oxygen   [] Productive cough   [] Hemoptysis   [] Wheeze  [] COPD   [] Asthma Neurologic:  [] Dizziness   [] Seizures   [] History of stroke   [] History of TIA  [] Aphasia   [] Vissual changes   [] Weakness or numbness in arm   [] Weakness or numbness in leg Musculoskeletal:   [] Joint swelling   [] Joint pain    [] Low back pain Hematologic:  [] Easy bruising  [] Easy bleeding   [] Hypercoagulable state   [] Anemic Gastrointestinal:  [] Diarrhea   [] Vomiting  [] Gastroesophageal reflux/heartburn   [] Difficulty swallowing. Genitourinary:  [] Chronic kidney disease   [] Difficult urination  [] Frequent urination   [] Blood in urine Skin:  [] Rashes   [] Ulcers  Psychological:  [] History of anxiety   []  History of major depression.  Physical Examination  Vitals:   02/14/17 1116  BP: 125/64  Pulse: (!) 51  Resp: 16  Weight: 200 lb (90.7 kg)   Body mass index is 27.12 kg/m. Gen: WD/WN, NAD Head: [place]/AT, No temporalis wasting.  Ear/Nose/Throat: Hearing grossly intact, nares w/o erythema or drainage Eyes: PER, EOMI, sclera nonicteric.  Neck: Supple, no large masses.   Pulmonary:  Good air movement, no audible wheezing bilaterally, no use of accessory muscles.  Cardiac: RRR, no JVD Vascular: bilateral carotid bruit Vessel Right Left  Radial Palpable Palpable  Ulnar Palpable Palpable  Brachial Palpable Palpable  Carotid Palpable Palpable  Gastrointestinal: Non-distended. No guarding/no peritoneal signs.  Musculoskeletal: M/S 5/5 throughout.  No deformity or atrophy.  Neurologic: CN 2-12 intact. Symmetrical.  Speech is fluent. Motor exam as listed above. Psychiatric: Judgment intact, Mood & affect appropriate for pt's clinical situation. Dermatologic: No rashes or ulcers noted.  No changes consistent with cellulitis. Lymph : No lichenification or skin changes of chronic lymphedema.  CBC No results found for: WBC, HGB, HCT, MCV, PLT  BMET    Component Value Date/Time   NA 140 05/15/2016 0842   NA 140 08/05/2015 1233   K 5.1 05/15/2016 0842   CL 101 05/15/2016 0842   CO2 27 05/15/2016 0842   GLUCOSE 112 (H) 05/15/2016 0842   BUN 16 05/15/2016 0842   BUN 15 08/05/2015 1233   CREATININE 1.04 05/15/2016 0842   CALCIUM 9.1 05/15/2016 0842   GFRNONAA 65 05/15/2016 0842   GFRAA 75 05/15/2016 0842    CrCl cannot be calculated (Patient's most recent lab result is older than the maximum 21 days allowed.).  COAG No results found for: INR, PROTIME  Radiology US Venous Img Lower Unilateral Right  Result Date: 01/31/2017 CLINICAL DATA:  Right lower extremity swelling and pain for 5 years. Patient takes 81 mg of aspirin daily. EXAM: RIGHT LOWER EXTREMITY VENOUS DOPPLER ULTRASOUND TECHNIQUE: Gray-scale sonography with graded compression, as well as color Doppler and duplex ultrasound were performed to evaluate the lower extremity deep venous systems from the level of the common femoral vein and including the common femoral, femoral, profunda femoral, popliteal and calf veins including the posterior tibial, peroneal and gastrocnemius veins when visible. The superficial great saphenous vein was also interrogated. Spectral Doppler was utilized to evaluate flow at rest and with distal augmentation maneuvers in  the common femoral, femoral and popliteal veins. COMPARISON:  None applicable FINDINGS: Contralateral Common Femoral Vein: Respiratory phasicity is normal and symmetric with the symptomatic side. No evidence of thrombus. Normal compressibility. Common Femoral Vein: No evidence of thrombus. Normal compressibility, respiratory phasicity and response to augmentation. Saphenofemoral Junction: No evidence of thrombus. Normal compressibility and flow on color Doppler imaging. Profunda Femoral Vein: No evidence of thrombus. Normal compressibility and flow on color Doppler imaging. Femoral Vein: No evidence of thrombus. Normal compressibility, respiratory phasicity and response to augmentation. Popliteal Vein: No evidence of thrombus. Normal compressibility, respiratory phasicity and response to augmentation. Calf Veins: No evidence of thrombus. Normal compressibility and flow on color Doppler imaging. Superficial Great Saphenous Vein: No evidence of thrombus. Normal compressibility and flow on color Doppler  imaging. Venous Reflux:  None. Other Findings:  None. IMPRESSION: No evidence of DVT within the right lower extremity. Electronically Signed   By: Nolon Nations M.D.   On: 01/31/2017 11:00    Assessment/Plan 1. Bilateral carotid artery stenosis Recommend:  Given the patient's asymptomatic subcritical stenosis no further invasive testing or surgery at this time.  Duplex ultrasound shows stenosis bilaterally.  Continue antiplatelet therapy as prescribed Continue management of CAD, HTN and Hyperlipidemia Healthy heart diet,  encouraged exercise at least 4 times per week Follow up in 12 months with duplex ultrasound and physical exam   2. Arteriosclerosis of coronary artery Continue cardiac and antihypertensive medications as already ordered and reviewed, no changes at this time.  Continue statin as ordered and reviewed, no changes at this time  Nitrates PRN for chest pain   3. Essential hypertension Continue antihypertensive medications as already ordered, these medications have been reviewed and there are no changes at this time.   4. Gastroesophageal reflux disease, esophagitis presence not specified Continue PPI's as already ordered, these medications have been reviewed and there are no changes at this time.   5. Leg swelling  No surgery or intervention at this point in time.    I have reviewed my previous discussion with the patient regarding swelling and why it  causes symptoms.  The patient is doing well with compression and will continue wearing graduated compression stockings class 1 (20-30 mmHg) on a daily basis a prescription was given. The patient will  continue wearing the stockings first thing in the morning and removing them in the evening. The patient is instructed specifically not to sleep in the stockings.    In addition, behavioral modification including elevation during the day and exercise will be continued.    Patient should follow-up on an annual basis      Hortencia Pilar, MD  02/17/2017 3:22 PM

## 2017-02-21 DIAGNOSIS — D044 Carcinoma in situ of skin of scalp and neck: Secondary | ICD-10-CM | POA: Diagnosis not present

## 2017-02-21 DIAGNOSIS — L570 Actinic keratosis: Secondary | ICD-10-CM | POA: Diagnosis not present

## 2017-02-25 ENCOUNTER — Ambulatory Visit: Payer: PPO | Admitting: Family Medicine

## 2017-02-26 ENCOUNTER — Encounter: Payer: Self-pay | Admitting: Family Medicine

## 2017-02-26 ENCOUNTER — Ambulatory Visit (INDEPENDENT_AMBULATORY_CARE_PROVIDER_SITE_OTHER): Payer: PPO | Admitting: Family Medicine

## 2017-02-26 VITALS — BP 120/65 | HR 66 | Temp 97.6°F | Resp 16 | Ht 72.0 in | Wt 204.4 lb

## 2017-02-26 DIAGNOSIS — M7989 Other specified soft tissue disorders: Secondary | ICD-10-CM

## 2017-02-26 DIAGNOSIS — E7800 Pure hypercholesterolemia, unspecified: Secondary | ICD-10-CM

## 2017-02-26 DIAGNOSIS — K219 Gastro-esophageal reflux disease without esophagitis: Secondary | ICD-10-CM

## 2017-02-26 DIAGNOSIS — I10 Essential (primary) hypertension: Secondary | ICD-10-CM

## 2017-02-26 DIAGNOSIS — R7303 Prediabetes: Secondary | ICD-10-CM

## 2017-02-26 MED ORDER — METOPROLOL TARTRATE 50 MG PO TABS: ORAL_TABLET | ORAL | 1 refills | Status: DC

## 2017-02-26 MED ORDER — ATORVASTATIN CALCIUM 10 MG PO TABS: 10.0000 mg | ORAL_TABLET | Freq: Every day | ORAL | 0 refills | Status: DC

## 2017-02-26 MED ORDER — PANTOPRAZOLE SODIUM 40 MG PO TBEC: 40.0000 mg | DELAYED_RELEASE_TABLET | Freq: Every day | ORAL | 0 refills | Status: DC

## 2017-02-26 MED ORDER — FUROSEMIDE 20 MG PO TABS: 20.0000 mg | ORAL_TABLET | Freq: Two times a day (BID) | ORAL | 0 refills | Status: DC

## 2017-02-26 NOTE — Progress Notes (Signed)
Name: Mark [name]   MRN: [id number]    DOB: [birth year]   Date:02/26/2017       Progress Note  Subjective  Chief Complaint  Chief Complaint  Patient presents with  . Follow-up    3 mo  . Hyperlipidemia  . Gastroesophageal Reflux    Hyperlipidemia  This is a chronic problem. The problem is controlled. Recent lipid tests were reviewed and are normal. Pertinent negatives include no chest pain, leg pain, myalgias or shortness of breath. Current antihyperlipidemic treatment includes statins.  Gastroesophageal Reflux  He reports no abdominal pain, no belching, no chest pain, no heartburn or no sore throat. This is a chronic problem. The symptoms are aggravated by certain foods (such as pizza etc. ). Risk factors include obesity. He has tried a PPI for the symptoms.  Hypertension  This is a chronic problem. The problem is unchanged. The problem is controlled. Pertinent negatives include no blurred vision, chest pain, headaches, palpitations or shortness of breath. Past treatments include beta blockers. Hypertensive end-organ damage includes CAD/MI. There is no history of kidney disease or CVA.     Past Medical History:  Diagnosis Date  . Arthritis   . Carotid artery disease ([place])   . Coronary artery disease 2005  . Hyperkalemia   . Hyperlipidemia   . Hypertension   . Past heart attack 2005    Past Surgical History:  Procedure Laterality Date  . CORONARY ANGIOPLASTY WITH STENT PLACEMENT  2005   [facility]    Family History  Problem Relation Age of Onset  . Hypertension Mother   . Heart disease Mother   . Heart disease Father   . Hypertension Father     Social History   Social History  . Marital status: Married    Spouse name: N/A  . Number of children: N/A  . Years of education: N/A   Occupational History  . Not on file.   Social History Main Topics  . Smoking status: Former Smoker    Quit date: 05/16/1963  . Smokeless tobacco: Never Used     Comment: quit over 50  years ago  . Alcohol use No  . Drug use: No  . Sexual activity: Not on file   Other Topics Concern  . Not on file   Social History Narrative  . No narrative on file     Current Outpatient Prescriptions:  .  acetaminophen (TYLENOL) 500 MG tablet, Take by mouth., Disp: , Rfl:  .  albuterol (VENTOLIN HFA) 108 (90 Base) MCG/ACT inhaler, Inhale 2 puffs into the lungs every 6 (six) hours as needed for wheezing or shortness of breath., Disp: 1 Inhaler, Rfl: 2 .  aspirin EC 81 MG tablet, Take 81 mg by mouth., Disp: , Rfl:  .  atorvastatin (LIPITOR) 10 MG tablet, Take 1 tablet (10 mg total) by mouth daily., Disp: 90 tablet, Rfl: 0 .  clopidogrel (PLAVIX) 75 MG tablet, 1 (one) Tablet, Oral, daily, Disp: 30 tablet, Rfl: 0 .  fluticasone (FLONASE) 50 MCG/ACT nasal spray, Place into the nose as needed. , Disp: , Rfl:  .  furosemide (LASIX) 20 MG tablet, Take 1 tablet (20 mg total) by mouth 2 (two) times daily., Disp: 180 tablet, Rfl: 0 .  gabapentin (NEURONTIN) 400 MG capsule, Take 400 mg by mouth 3 (three) times daily., Disp: , Rfl:  .  Loratadine 10 MG CAPS, Take by mouth., Disp: , Rfl:  .  metoprolol (LOPRESSOR) 50 MG tablet, Take 1 tablet (50 mg  total) by mouth 2 (two) times daily., Disp: 180 tablet, Rfl: 1 .  neomycin-polymyxin-hydrocortisone (CORTISPORIN) otic solution, Place 3 drops into both ears at bedtime as needed., Disp: 10 mL, Rfl: 0 .  pantoprazole (PROTONIX) 40 MG tablet, Take 1 tablet (40 mg total) by mouth daily., Disp: 90 tablet, Rfl: 0 .  Probiotic Product (PROBIOTIC PO), Take by mouth daily., Disp: , Rfl:  .  gabapentin (NEURONTIN) 400 MG capsule, Take 1 capsule (400 mg total) by mouth 2 (two) times daily. (Patient not taking: Reported on 02/26/2017), Disp: 180 capsule, Rfl: 1  Allergies  Allergen Reactions  . Rosuvastatin Rash and Other (See Comments)    No reaction per pt, He saw some information regarding Crestor and adverse effects No reaction per pt, He saw some  information regarding Crestor and adverse effects No reaction per pt, He saw some information regarding Crestor and adverse effects No reaction per pt, He saw some information regarding Crestor and adverse effects No reaction per pt, He saw some information regarding Crestor and adverse effects     Review of Systems  HENT: Negative for sore throat.   Eyes: Negative for blurred vision.  Respiratory: Negative for shortness of breath.   Cardiovascular: Negative for chest pain and palpitations.  Gastrointestinal: Negative for abdominal pain and heartburn.  Musculoskeletal: Negative for myalgias.  Neurological: Negative for headaches.     Objective  Vitals:   02/26/17 0957  BP: 120/65  Pulse: 66  Resp: 16  Temp: 97.6 F (36.4 C)  TempSrc: Oral  SpO2: 97%  Weight: 204 lb 6.4 oz (92.7 kg)  Height: 6' (1.829 m)    Physical Exam  Constitutional: He is oriented to person, place, and time and well-developed, well-nourished, and in no distress.  HENT:  Head: Normocephalic and atraumatic.  Cardiovascular: Normal rate, regular rhythm and normal heart sounds.   No murmur heard. Pulmonary/Chest: Effort normal and breath sounds normal. No respiratory distress. He has no wheezes.  Abdominal: Soft. Bowel sounds are normal. There is no tenderness.  Musculoskeletal: He exhibits edema.  Neurological: He is alert and oriented to person, place, and time.  Psychiatric: Mood, memory, affect and judgment normal.  Nursing note and vitals reviewed.       Assessment & Plan  1. Leg swelling Continue on furosemide 20 mg twice a day for leg swelling. - furosemide (LASIX) 20 MG tablet; Take 1 tablet (20 mg total) by mouth 2 (two) times daily.  Dispense: 180 tablet; Refill: 0  2. Pure hypercholesterolemia FLP at goal - atorvastatin (LIPITOR) 10 MG tablet; Take 1 tablet (10 mg total) by mouth daily.  Dispense: 90 tablet; Refill: 0  3. Gastroesophageal reflux disease, esophagitis presence not  specified Symptoms stable on PPI - pantoprazole (PROTONIX) 40 MG tablet; Take 1 tablet (40 mg total) by mouth daily.  Dispense: 90 tablet; Refill: 0  4. Essential hypertension BP stable on present antihypertensive treatment - metoprolol tartrate (LOPRESSOR) 50 MG tablet; Take 1 tablet (50 mg total) by mouth 2 (two) times daily.  Dispense: 180 tablet; Refill: 1  5. Prediabetes  - HgB A1c   Travelle Mcclimans Asad A. [facility] 02/26/2017 10:05 AM

## 2017-02-27 LAB — HEMOGLOBIN A1C
HEMOGLOBIN A1C: 5.8 % — AB (ref <5.7)
MEAN PLASMA GLUCOSE: 120 mg/dL

## 2017-04-15 ENCOUNTER — Ambulatory Visit (INDEPENDENT_AMBULATORY_CARE_PROVIDER_SITE_OTHER): Payer: PPO

## 2017-04-15 VITALS — BP 128/56 | HR 64 | Temp 96.8°F | Ht 72.0 in | Wt 200.9 lb

## 2017-04-15 DIAGNOSIS — Z0000 Encounter for general adult medical examination without abnormal findings: Secondary | ICD-10-CM | POA: Diagnosis not present

## 2017-04-15 NOTE — Patient Instructions (Signed)
Mark [name] , Thank you for taking time to come for your Medicare Wellness Visit. I appreciate your ongoing commitment to your health goals. Please review the following plan we discussed and let me know if I can assist you in the future.   Screening recommendations/referrals: Colonoscopy: up to date Recommended yearly ophthalmology/optometry visit for glaucoma screening and checkup Recommended yearly dental visit for hygiene and checkup  Vaccinations: Influenza vaccine: unable to receive today (out of stock) Pneumococcal vaccine: Prevnar 13 completed, Pneumovax 23 due and pt to receive at next OV (out of stock today) Tdap vaccine: declined Shingles vaccine: completed 05/07/07  Advanced directives: Please bring a copy of your POA (Power of [place]) and/or Living Will to your next appointment.   Conditions/risks identified: Recommend to continue to increase water intake to 6 glasses a day.  Next appointment: 05/28/17  Preventive Care 29 Years and Older, Male Preventive care refers to lifestyle choices and visits with your health care provider that can promote health and wellness. What does preventive care include?  A yearly physical exam. This is also called an annual well check.  Dental exams once or twice a year.  Routine eye exams. Ask your health care provider how often you should have your eyes checked.  Personal lifestyle choices, including:  Daily care of your teeth and gums.  Regular physical activity.  Eating a healthy diet.  Avoiding tobacco and drug use.  Limiting alcohol use.  Practicing safe sex.  Taking low doses of aspirin every day.  Taking vitamin and mineral supplements as recommended by your health care provider. What happens during an annual well check? The services and screenings done by your health care provider during your annual well check will depend on your age, overall health, lifestyle risk factors, and family history of disease. Counseling    Your health care provider may ask you questions about your:  Alcohol use.  Tobacco use.  Drug use.  Emotional well-being.  Home and relationship well-being.  Sexual activity.  Eating habits.  History of falls.  Memory and ability to understand (cognition).  Work and work Statistician. Screening  You may have the following tests or measurements:  Height, weight, and BMI.  Blood pressure.  Lipid and cholesterol levels. These may be checked every 5 years, or more frequently if you are over 5 years old.  Skin check.  Lung cancer screening. You may have this screening every year starting at age 47 if you have a 30-pack-year history of smoking and currently smoke or have quit within the past 15 years.  Fecal occult blood test (FOBT) of the stool. You may have this test every year starting at age 53.  Flexible sigmoidoscopy or colonoscopy. You may have a sigmoidoscopy every 5 years or a colonoscopy every 10 years starting at age 22.  Prostate cancer screening. Recommendations will vary depending on your family history and other risks.  Hepatitis C blood test.  Hepatitis B blood test.  Sexually transmitted disease (STD) testing.  Diabetes screening. This is done by checking your blood sugar (glucose) after you have not eaten for a while (fasting). You may have this done every 1-3 years.  Abdominal aortic aneurysm (AAA) screening. You may need this if you are a current or former smoker.  Osteoporosis. You may be screened starting at age 75 if you are at high risk. Talk with your health care provider about your test results, treatment options, and if necessary, the need for more tests. Vaccines  Your health  care provider may recommend certain vaccines, such as:  Influenza vaccine. This is recommended every year.  Tetanus, diphtheria, and acellular pertussis (Tdap, Td) vaccine. You may need a Td booster every 10 years.  Zoster vaccine. You may need this after age  81.  Pneumococcal 13-valent conjugate (PCV13) vaccine. One dose is recommended after age 1.  Pneumococcal polysaccharide (PPSV23) vaccine. One dose is recommended after age 45. Talk to your health care provider about which screenings and vaccines you need and how often you need them. This information is not intended to replace advice given to you by your health care provider. Make sure you discuss any questions you have with your health care provider. Document Released: 08/12/2015 Document Revised: 04/04/2016 Document Reviewed: 05/17/2015 Elsevier Interactive Patient Education  2017 [place] Prevention in the Home Falls can cause injuries. They can happen to people of all ages. There are many things you can do to make your home safe and to help prevent falls. What can I do on the outside of my home?  Regularly fix the edges of walkways and driveways and fix any cracks.  Remove anything that might make you trip as you walk through a door, such as a raised step or threshold.  Trim any bushes or trees on the path to your home.  Use bright outdoor lighting.  Clear any walking paths of anything that might make someone trip, such as rocks or tools.  Regularly check to see if handrails are loose or broken. Make sure that both sides of any steps have handrails.  Any raised decks and porches should have guardrails on the edges.  Have any leaves, snow, or ice cleared regularly.  Use sand or salt on walking paths during winter.  Clean up any spills in your garage right away. This includes oil or grease spills. What can I do in the bathroom?  Use night lights.  Install grab bars by the toilet and in the tub and shower. Do not use towel bars as grab bars.  Use non-skid mats or decals in the tub or shower.  If you need to sit down in the shower, use a plastic, non-slip stool.  Keep the floor dry. Clean up any water that spills on the floor as soon as it happens.  Remove  soap buildup in the tub or shower regularly.  Attach bath mats securely with double-sided non-slip rug tape.  Do not have throw rugs and other things on the floor that can make you trip. What can I do in the bedroom?  Use night lights.  Make sure that you have a light by your bed that is easy to reach.  Do not use any sheets or blankets that are too big for your bed. They should not hang down onto the floor.  Have a firm chair that has side arms. You can use this for support while you get dressed.  Do not have throw rugs and other things on the floor that can make you trip. What can I do in the kitchen?  Clean up any spills right away.  Avoid walking on wet floors.  Keep items that you use a lot in easy-to-reach places.  If you need to reach something above you, use a strong step stool that has a grab bar.  Keep electrical cords out of the way.  Do not use floor polish or wax that makes floors slippery. If you must use wax, use non-skid floor wax.  Do not have  throw rugs and other things on the floor that can make you trip. What can I do with my stairs?  Do not leave any items on the stairs.  Make sure that there are handrails on both sides of the stairs and use them. Fix handrails that are broken or loose. Make sure that handrails are as long as the stairways.  Check any carpeting to make sure that it is firmly attached to the stairs. Fix any carpet that is loose or worn.  Avoid having throw rugs at the top or bottom of the stairs. If you do have throw rugs, attach them to the floor with carpet tape.  Make sure that you have a light switch at the top of the stairs and the bottom of the stairs. If you do not have them, ask someone to add them for you. What else can I do to help prevent falls?  Wear shoes that:  Do not have high heels.  Have rubber bottoms.  Are comfortable and fit you well.  Are closed at the toe. Do not wear sandals.  If you use a  stepladder:  Make sure that it is fully opened. Do not climb a closed stepladder.  Make sure that both sides of the stepladder are locked into place.  Ask someone to hold it for you, if possible.  Clearly mark and make sure that you can see:  Any grab bars or handrails.  First and last steps.  Where the edge of each step is.  Use tools that help you move around (mobility aids) if they are needed. These include:  Canes.  Walkers.  Scooters.  Crutches.  Turn on the lights when you go into a dark area. Replace any light bulbs as soon as they burn out.  Set up your furniture so you have a clear path. Avoid moving your furniture around.  If any of your floors are uneven, fix them.  If there are any pets around you, be aware of where they are.  Review your medicines with your doctor. Some medicines can make you feel dizzy. This can increase your chance of falling. Ask your doctor what other things that you can do to help prevent falls. This information is not intended to replace advice given to you by your health care provider. Make sure you discuss any questions you have with your health care provider. Document Released: 05/12/2009 Document Revised: 12/22/2015 Document Reviewed: 08/20/2014 Elsevier Interactive Patient Education  2017 [employer].
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
## 2017-05-02 ENCOUNTER — Other Ambulatory Visit: Payer: Self-pay | Admitting: Family Medicine

## 2017-05-02 DIAGNOSIS — R062 Wheezing: Secondary | ICD-10-CM

## 2017-05-21 ENCOUNTER — Telehealth: Payer: Self-pay

## 2017-05-21 ENCOUNTER — Ambulatory Visit (INDEPENDENT_AMBULATORY_CARE_PROVIDER_SITE_OTHER): Payer: PPO | Admitting: Family Medicine

## 2017-05-21 ENCOUNTER — Ambulatory Visit
Admission: RE | Admit: 2017-05-21 | Discharge: 2017-05-21 | Disposition: A | Discharge status: Home / Self Care | Payer: PPO | Source: Ambulatory Visit | Attending: Family Medicine | Admitting: Family Medicine

## 2017-05-21 ENCOUNTER — Encounter: Payer: Self-pay | Admitting: Family Medicine

## 2017-05-21 VITALS — BP 100/60 | HR 69 | Temp 97.6°F | Resp 16 | Wt 202.6 lb

## 2017-05-21 DIAGNOSIS — R42 Dizziness and giddiness: Secondary | ICD-10-CM

## 2017-05-21 DIAGNOSIS — R0609 Other forms of dyspnea: Secondary | ICD-10-CM | POA: Diagnosis not present

## 2017-05-21 DIAGNOSIS — R0600 Dyspnea, unspecified: Secondary | ICD-10-CM | POA: Diagnosis not present

## 2017-05-21 DIAGNOSIS — G3281 Cerebellar ataxia in diseases classified elsewhere: Secondary | ICD-10-CM

## 2017-05-21 DIAGNOSIS — R0789 Other chest pain: Secondary | ICD-10-CM

## 2017-05-21 DIAGNOSIS — R0602 Shortness of breath: Secondary | ICD-10-CM | POA: Diagnosis not present

## 2017-05-21 LAB — COMPLETE METABOLIC PANEL WITH GFR
AG RATIO: 1.7 (calc) (ref 1.0–2.5)
ALBUMIN MSPROF: 4 g/dL (ref 3.6–5.1)
ALT: 11 U/L (ref 9–46)
AST: 16 U/L (ref 10–35)
Alkaline phosphatase (APISO): 62 U/L (ref 40–115)
BILIRUBIN TOTAL: 0.6 mg/dL (ref 0.2–1.2)
BUN: 15 mg/dL (ref 7–25)
CHLORIDE: 103 mmol/L (ref 98–110)
CO2: 28 mmol/L (ref 20–32)
Calcium: 8.7 mg/dL (ref 8.6–10.3)
Creat: 0.94 mg/dL (ref 0.70–1.11)
GFR, EST AFRICAN AMERICAN: 85 mL/min/{1.73_m2} (ref >=60)
GFR, Est Non African American: 73 mL/min/{1.73_m2} (ref >=60)
GLOBULIN: 2.3 g/dL (ref 1.9–3.7)
Glucose, Bld: 109 mg/dL (ref 65–139)
POTASSIUM: 4.3 mmol/L (ref 3.5–5.3)
SODIUM: 140 mmol/L (ref 135–146)
TOTAL PROTEIN: 6.3 g/dL (ref 6.1–8.1)

## 2017-05-21 LAB — CBC WITH DIFFERENTIAL/PLATELET
BASOS PCT: 0.3 %
Basophils Absolute: 24 cells/uL (ref 0–200)
EOS ABS: 192 {cells}/uL (ref 15–500)
Eosinophils Relative: 2.4 %
HCT: 41.4 % (ref 38.5–50.0)
HEMOGLOBIN: 14.1 g/dL (ref 13.2–17.1)
Lymphs Abs: 2672 cells/uL (ref 850–3900)
MCH: 30.9 pg (ref 27.0–33.0)
MCHC: 34.1 g/dL (ref 32.0–36.0)
MCV: 90.6 fL (ref 80.0–100.0)
MPV: 9.3 fL (ref 7.5–12.5)
Monocytes Relative: 12 %
Neutro Abs: 4152 cells/uL (ref 1500–7800)
Neutrophils Relative %: 51.9 %
PLATELETS: 284 10*3/uL (ref 140–400)
RBC: 4.57 10*6/uL (ref 4.20–5.80)
RDW: 11.9 % (ref 11.0–15.0)
TOTAL LYMPHOCYTE: 33.4 %
WBC: 8 10*3/uL (ref 3.8–10.8)
WBCMIX: 960 {cells}/uL — AB (ref 200–950)

## 2017-05-21 LAB — D-DIMER, QUANTITATIVE: D-Dimer, Quant: 0.8 mcg/mL FEU — ABNORMAL HIGH (ref <0.50)

## 2017-05-21 NOTE — Telephone Encounter (Signed)
Pt has been informed  of his apt's and instructions.

## 2017-05-21 NOTE — Progress Notes (Signed)
Name: Mark [name]   MRN: [id number]    DOB: 18-Apr-1929   Date:05/21/2017       Progress Note  Subjective  Chief Complaint  Chief Complaint  Patient presents with  . Shortness of Breath    Pt states the little things he does he gets this way  . Dizziness    BP took the other night 148/85  . heavy chest    Pt states his chest felt heavy and tight but three says ago he did have some right lower pain   . Wheezing    Shortness of Breath  This is a recurrent problem. The current episode started more than 1 month ago. Associated symptoms include chest pain (a few nights ago, he had right sided squeezing chest pain, lasted a few minutes and he went back to sleep), leg pain (at baseline, he has bilateral leg swelling) and leg swelling. Pertinent negatives include no fever, orthopnea, vomiting or wheezing. His past medical history is significant for CAD (s/p stents.).  Dizziness  This is a recurrent problem. Associated symptoms include chest pain (a few nights ago, he had right sided squeezing chest pain, lasted a few minutes and he went back to sleep). Pertinent negatives include no chills, congestion, coughing, fatigue, fever, nausea or vomiting.     Past Medical History:  Diagnosis Date  . Arthritis   . Carotid artery disease ([place])   . Coronary artery disease 2005  . Hyperkalemia   . Hyperlipidemia   . Hypertension   . Past heart attack 2005    Past Surgical History:  Procedure Laterality Date  . CORONARY ANGIOPLASTY WITH STENT PLACEMENT  2005   [facility]    Family History  Problem Relation Age of Onset  . Hypertension Mother   . Heart disease Mother   . Heart disease Father   . Hypertension Father     Social History   Social History  . Marital status: Married    Spouse name: N/A  . Number of children: N/A  . Years of education: N/A   Occupational History  . Not on file.   Social History Main Topics  . Smoking status: Former Smoker    Quit date: 05/16/1963  .  Smokeless tobacco: Never Used     Comment: quit over 50 years ago  . Alcohol use No  . Drug use: No  . Sexual activity: Not Currently   Other Topics Concern  . Not on file   Social History Narrative  . No narrative on file     Current Outpatient Prescriptions:  .  acetaminophen (TYLENOL) 500 MG tablet, Take 500 mg by mouth every 6 (six) hours as needed. , Disp: , Rfl:  .  aspirin EC 81 MG tablet, Take 81 mg by mouth daily. , Disp: , Rfl:  .  atorvastatin (LIPITOR) 10 MG tablet, Take 1 tablet (10 mg total) by mouth daily., Disp: 90 tablet, Rfl: 0 .  clopidogrel (PLAVIX) 75 MG tablet, 1 (one) Tablet, Oral, daily, Disp: 30 tablet, Rfl: 0 .  fluticasone (FLONASE) 50 MCG/ACT nasal spray, Place into the nose as needed. , Disp: , Rfl:  .  furosemide (LASIX) 20 MG tablet, Take 1 tablet (20 mg total) by mouth 2 (two) times daily., Disp: 180 tablet, Rfl: 0 .  gabapentin (NEURONTIN) 400 MG capsule, Take 400 mg by mouth 2 (two) times daily. , Disp: , Rfl:  .  Loratadine 10 MG CAPS, Take 10 mg by mouth daily. , Disp: ,  Rfl:  .  metoprolol tartrate (LOPRESSOR) 50 MG tablet, Take 1 tablet (50 mg total) by mouth 2 (two) times daily., Disp: 180 tablet, Rfl: 1 .  pantoprazole (PROTONIX) 40 MG tablet, Take 1 tablet (40 mg total) by mouth daily., Disp: 90 tablet, Rfl: 0 .  PROAIR HFA 108 (90 Base) MCG/ACT inhaler, Inhale 2 puffs into the lungs every 6 (six) hours as needed for wheezing or shortness of breath., Disp: 8.5 g, Rfl: 0 .  Probiotic Product (PROBIOTIC PO), Take by mouth daily. , Disp: , Rfl:  .  neomycin-polymyxin-hydrocortisone (CORTISPORIN) otic solution, Place 3 drops into both ears at bedtime as needed. (Patient not taking: Reported on 05/21/2017), Disp: 10 mL, Rfl: 0  Allergies  Allergen Reactions  . Rosuvastatin Rash and Other (See Comments)    No reaction per pt, He saw some information regarding Crestor and adverse effects No reaction per pt, He saw some information regarding Crestor  and adverse effects No reaction per pt, He saw some information regarding Crestor and adverse effects No reaction per pt, He saw some information regarding Crestor and adverse effects No reaction per pt, He saw some information regarding Crestor and adverse effects     Review of Systems  Constitutional: Negative for chills, fatigue and fever.  HENT: Negative for congestion.   Respiratory: Positive for shortness of breath. Negative for cough and wheezing.   Cardiovascular: Positive for chest pain (a few nights ago, he had right sided squeezing chest pain, lasted a few minutes and he went back to sleep) and leg swelling. Negative for orthopnea.  Gastrointestinal: Negative for nausea and vomiting.  Neurological: Positive for dizziness.      Objective  Vitals:   05/21/17 0926  BP: 100/60  Pulse: 69  Resp: 16  Temp: 97.6 F (36.4 C)  TempSrc: Oral  SpO2: 95%  Weight: 202 lb 9.6 oz (91.9 kg)    Physical Exam  Constitutional: He is oriented to person, place, and time and well-developed, well-nourished, and in no distress.  HENT:  Head: Normocephalic and atraumatic.  Right Ear: Tympanic membrane and ear canal normal. No drainage or swelling.  Left Ear: Tympanic membrane and ear canal normal. No drainage or swelling.  Cardiovascular: Normal rate, regular rhythm and normal heart sounds.   No murmur heard. Pulmonary/Chest: Effort normal and breath sounds normal. He has no wheezes.  Abdominal: Soft. Bowel sounds are normal. There is no tenderness.  Musculoskeletal: He exhibits edema and tenderness.  Neurological: He is alert and oriented to person, place, and time. He has normal strength and intact cranial nerves. He has an abnormal Cerebellar Exam. Gait normal. GCS score is 15.  Patient had difficulty standing straight with eyes closed and arms extended in front of him him a concern about cerebellar function  Nursing note and vitals reviewed.    Recent Results (from the past  2160 hour(s))  HgB A1c     Status: Abnormal   Collection Time: 02/26/17 10:25 AM  Result Value Ref Range   Hgb A1c MFr Bld 5.8 (H) <5.7 %    Comment:   For someone without known diabetes, a hemoglobin A1c value between 5.7% and 6.4% is consistent with prediabetes and should be confirmed with a follow-up test.   For someone with known diabetes, a value <7% indicates that their diabetes is well controlled. A1c targets should be individualized based on duration of diabetes, age, co-morbid conditions and other considerations.   This assay result is consistent with an increased risk  of diabetes.   Currently, no consensus exists regarding use of hemoglobin A1c for diagnosis of diabetes in children.      Mean Plasma Glucose 120 mg/dL     Assessment & Plan  1. Dizziness on standing Patient had difficulty standing straight with eyes closed and arms extended in front of him him a concern about cerebellar function,We'll obtain MRI of brain  2. Pressure in right side of chest Obtain chest x-ray, EKG was unchanged from prior, has been referred to cardiology based on symptoms - DG Chest 2 View; Future - Ambulatory referral to Cardiology  3. Dyspnea on exertion Obtain lab work to rule out heart failure, if d-dimer is positive, rule out DVT and/or PE - Pro b natriuretic peptide (BNP) - CBC with Differential/Platelet - COMPLETE METABOLIC PANEL WITH GFR - D-Dimer, Quantitative  4. Cerebellar ataxia in diseases classified elsewhere [facility])  - MR Brain Wo Contrast; Future   Sherrod Toothman [facility] 05/21/2017 9:41 AM

## 2017-05-21 NOTE — Telephone Encounter (Signed)
-----   Message from Dennard Schaumann, [place] sent at 05/21/2017 10:59 AM EDT ----- Patient has been scheduled to see Dr. Nehemiah Massed at [facility] tomorrow (05/22/17) at 1:30pm.  MRI of the brain has been scheduled for Monday, May 27, 2017 @ 9am. Patient is to arrive at the [place] at 8:30am.   Thanks

## 2017-05-22 DIAGNOSIS — I10 Essential (primary) hypertension: Secondary | ICD-10-CM | POA: Diagnosis not present

## 2017-05-22 DIAGNOSIS — I6523 Occlusion and stenosis of bilateral carotid arteries: Secondary | ICD-10-CM | POA: Insufficient documentation

## 2017-05-22 DIAGNOSIS — E7800 Pure hypercholesterolemia, unspecified: Secondary | ICD-10-CM | POA: Diagnosis not present

## 2017-05-22 DIAGNOSIS — I25118 Atherosclerotic heart disease of native coronary artery with other forms of angina pectoris: Secondary | ICD-10-CM | POA: Diagnosis not present

## 2017-05-22 DIAGNOSIS — R0602 Shortness of breath: Secondary | ICD-10-CM | POA: Diagnosis not present

## 2017-05-22 DIAGNOSIS — R0609 Other forms of dyspnea: Secondary | ICD-10-CM | POA: Diagnosis not present

## 2017-05-23 LAB — BRAIN NATRIURETIC PEPTIDE: BRAIN NATRIURETIC PEPTIDE: 28 pg/mL (ref <100)

## 2017-05-24 ENCOUNTER — Telehealth: Payer: Self-pay | Admitting: General Surgery

## 2017-05-24 NOTE — Telephone Encounter (Signed)
Copied from [place] [id number]. Topic: Inquiry >> May 24, 2017  3:25 PM Boyd Kerbs wrote: Reason for CRM: monica at [facility] [phone]

## 2017-05-27 ENCOUNTER — Ambulatory Visit: Admission: RE | Admit: 2017-05-27 | Payer: PPO | Source: Ambulatory Visit

## 2017-05-27 DIAGNOSIS — C4441 Basal cell carcinoma of skin of scalp and neck: Secondary | ICD-10-CM | POA: Diagnosis not present

## 2017-05-27 DIAGNOSIS — Z85828 Personal history of other malignant neoplasm of skin: Secondary | ICD-10-CM | POA: Diagnosis not present

## 2017-05-27 DIAGNOSIS — Z85820 Personal history of malignant melanoma of skin: Secondary | ICD-10-CM | POA: Diagnosis not present

## 2017-05-27 DIAGNOSIS — X32XXXA Exposure to sunlight, initial encounter: Secondary | ICD-10-CM | POA: Diagnosis not present

## 2017-05-27 DIAGNOSIS — D485 Neoplasm of uncertain behavior of skin: Secondary | ICD-10-CM | POA: Diagnosis not present

## 2017-05-27 DIAGNOSIS — L923 Foreign body granuloma of the skin and subcutaneous tissue: Secondary | ICD-10-CM | POA: Diagnosis not present

## 2017-05-27 DIAGNOSIS — Z08 Encounter for follow-up examination after completed treatment for malignant neoplasm: Secondary | ICD-10-CM | POA: Diagnosis not present

## 2017-05-27 DIAGNOSIS — D0462 Carcinoma in situ of skin of left upper limb, including shoulder: Secondary | ICD-10-CM | POA: Diagnosis not present

## 2017-05-27 DIAGNOSIS — C4442 Squamous cell carcinoma of skin of scalp and neck: Secondary | ICD-10-CM | POA: Diagnosis not present

## 2017-05-27 DIAGNOSIS — L570 Actinic keratosis: Secondary | ICD-10-CM | POA: Diagnosis not present

## 2017-05-27 NOTE — Telephone Encounter (Signed)
Spoke with monica and she states she was trying to figure out were the pt going to have his MR done. Gave her [place]. She states that's all she needed and he is approved to have it up to 90 days.

## 2017-05-27 NOTE — Progress Notes (Signed)
Patient has not called back in to the [facility] 

## 2017-05-28 ENCOUNTER — Ambulatory Visit: Payer: Self-pay | Admitting: Family Medicine

## 2017-05-31 ENCOUNTER — Ambulatory Visit: Payer: PPO

## 2017-06-01 ENCOUNTER — Other Ambulatory Visit: Payer: Self-pay | Admitting: Family Medicine

## 2017-06-01 DIAGNOSIS — M7989 Other specified soft tissue disorders: Secondary | ICD-10-CM

## 2017-06-07 ENCOUNTER — Ambulatory Visit
Admission: RE | Admit: 2017-06-07 | Discharge: 2017-06-07 | Disposition: A | Discharge status: Home / Self Care | Payer: PPO | Source: Ambulatory Visit | Attending: Family Medicine | Admitting: Family Medicine

## 2017-06-07 DIAGNOSIS — G3281 Cerebellar ataxia in diseases classified elsewhere: Secondary | ICD-10-CM

## 2017-06-07 DIAGNOSIS — R51 Headache: Secondary | ICD-10-CM | POA: Diagnosis not present

## 2017-06-07 DIAGNOSIS — Z8673 Personal history of transient ischemic attack (TIA), and cerebral infarction without residual deficits: Secondary | ICD-10-CM | POA: Diagnosis not present

## 2017-06-07 DIAGNOSIS — R42 Dizziness and giddiness: Secondary | ICD-10-CM | POA: Diagnosis not present

## 2017-06-11 DIAGNOSIS — I2510 Atherosclerotic heart disease of native coronary artery without angina pectoris: Secondary | ICD-10-CM | POA: Diagnosis not present

## 2017-06-11 DIAGNOSIS — I255 Ischemic cardiomyopathy: Secondary | ICD-10-CM | POA: Insufficient documentation

## 2017-06-11 DIAGNOSIS — R0602 Shortness of breath: Secondary | ICD-10-CM | POA: Diagnosis not present

## 2017-06-11 DIAGNOSIS — I6523 Occlusion and stenosis of bilateral carotid arteries: Secondary | ICD-10-CM | POA: Diagnosis not present

## 2017-06-11 DIAGNOSIS — I10 Essential (primary) hypertension: Secondary | ICD-10-CM | POA: Diagnosis not present

## 2017-06-17 DIAGNOSIS — C4442 Squamous cell carcinoma of skin of scalp and neck: Secondary | ICD-10-CM | POA: Diagnosis not present

## 2017-06-17 DIAGNOSIS — Z85828 Personal history of other malignant neoplasm of skin: Secondary | ICD-10-CM | POA: Diagnosis not present

## 2017-06-19 ENCOUNTER — Other Ambulatory Visit: Payer: Self-pay | Admitting: Family Medicine

## 2017-06-19 DIAGNOSIS — K219 Gastro-esophageal reflux disease without esophagitis: Secondary | ICD-10-CM

## 2017-06-24 ENCOUNTER — Ambulatory Visit: Payer: PPO | Admitting: Family Medicine

## 2017-06-24 ENCOUNTER — Encounter: Payer: Self-pay | Admitting: Family Medicine

## 2017-06-24 VITALS — BP 136/74 | HR 66 | Temp 97.8°F | Resp 16 | Wt 200.4 lb

## 2017-06-24 DIAGNOSIS — I2510 Atherosclerotic heart disease of native coronary artery without angina pectoris: Secondary | ICD-10-CM | POA: Diagnosis not present

## 2017-06-24 DIAGNOSIS — R0600 Dyspnea, unspecified: Secondary | ICD-10-CM

## 2017-06-24 DIAGNOSIS — R42 Dizziness and giddiness: Secondary | ICD-10-CM

## 2017-06-24 MED ORDER — CLOPIDOGREL BISULFATE 75 MG PO TABS: ORAL_TABLET | ORAL | 1 refills | Status: DC

## 2017-06-24 NOTE — Progress Notes (Signed)
Name: Mark [name]   MRN: [id number]    DOB: 09-Sep-1928   Date:06/24/2017       Progress Note  Subjective  Chief Complaint  Chief Complaint  Patient presents with  . Follow-up    1 month; Still having SOB, some dizziness   . Medication Refill    plavix pt asking for 90 days supply     HPI  Patient presents for follow-up after obtaining workup for dizziness and dyspnea. 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 He is otherwise well, offers no new complaints, lab work and imaging reviewed in detail, cardiology note reviewed in detail  Past Medical History:  Diagnosis Date  . Arthritis   . Carotid artery disease ([place])   . Coronary artery disease 2005  . Hyperkalemia   . Hyperlipidemia   . Hypertension   . Past heart attack 2005    Past Surgical History:  Procedure Laterality Date  . CORONARY ANGIOPLASTY WITH STENT PLACEMENT  2005   [facility]    Family History  Problem Relation Age of Onset  . Hypertension Mother   . Heart disease Mother   . Heart disease Father   . Hypertension Father     Social History   Socioeconomic History  . Marital status: Widowed    Spouse name: Not on file  . Number of children: Not on file  . Years of education: Not on file  . Highest education level: Not on file  Social Needs  . Financial resource strain: Not on file  . Food insecurity - worry: Not on file  . Food insecurity - inability: Not on file  . Transportation needs - medical: Not on file  . Transportation needs - non-medical: Not on file  Occupational History  . Not on file  Tobacco Use  . Smoking status: Former Smoker    Last attempt to quit: 05/16/1963     Years since quitting: 54.1  . Smokeless tobacco: Never Used  . Tobacco comment: quit over 50 years ago  Substance and Sexual Activity  . Alcohol use: No  . Drug use: No  . Sexual activity: Not Currently  Other Topics Concern  . Not on file  Social History Narrative  . Not on file     Current Outpatient Medications:  .  acetaminophen (TYLENOL) 500 MG tablet, Take 500 mg by mouth every 6 (six) hours as needed. , Disp: , Rfl:  .  aspirin EC 81 MG tablet, Take 81 mg by mouth daily. , Disp: , Rfl:  .  atorvastatin (LIPITOR) 10 MG tablet, Take 1 tablet (10 mg total) by mouth daily., Disp: 90 tablet, Rfl: 0 .  clopidogrel (PLAVIX) 75 MG tablet, 1 (one) Tablet, Oral, daily, Disp: 30 tablet, Rfl: 0 .  doxycycline (VIBRAMYCIN) 100 MG capsule, Take 1 capsule by mouth daily. For seven days, Disp: , Rfl:  .  fluticasone (FLONASE) 50 MCG/ACT nasal spray, Place into the nose as needed. , Disp: , Rfl:  .  furosemide (LASIX) 20 MG tablet, Take 1 tablet (20 mg total) by mouth 2 (two) times daily., Disp: 180 tablet, Rfl: 0 .  gabapentin (NEURONTIN) 400 MG capsule, Take 400 mg by mouth 2 (two) times  daily. , Disp: , Rfl:  .  Loratadine 10 MG CAPS, Take 10 mg by mouth daily. , Disp: , Rfl:  .  metoprolol tartrate (LOPRESSOR) 50 MG tablet, Take 1 tablet (50 mg total) by mouth 2 (two) times daily., Disp: 180 tablet, Rfl: 1 .  neomycin-polymyxin-hydrocortisone (CORTISPORIN) otic solution, Place 3 drops into both ears at bedtime as needed., Disp: 10 mL, Rfl: 0 .  pantoprazole (PROTONIX) 40 MG tablet, Take 1 tablet (40 mg total) by mouth daily., Disp: 90 tablet, Rfl: 0 .  pantoprazole (PROTONIX) 40 MG tablet, Take 1 tablet (40 mg total) by mouth daily., Disp: 90 tablet, Rfl: 0 .  PROAIR HFA 108 (90 Base) MCG/ACT inhaler, Inhale 2 puffs into the lungs every 6 (six) hours as needed for wheezing or shortness of breath., Disp: 8.5 g, Rfl: 0 .  Probiotic Product (PROBIOTIC PO), Take by mouth daily. , Disp: , Rfl:    Allergies  Allergen Reactions  . Rosuvastatin Rash and Other (See Comments)    No reaction per pt, He saw some information regarding Crestor and adverse effects No reaction per pt, He saw some information regarding Crestor and adverse effects No reaction per pt, He saw some information regarding Crestor and adverse effects No reaction per pt, He saw some information regarding Crestor and adverse effects No reaction per pt, He saw some information regarding Crestor and adverse effects     ROS  Please see history of present illness for complete discussion of ROS  Objective  Vitals:   06/24/17 1131  BP: 136/74  Pulse: 66  Resp: 16  Temp: 97.8 F (36.6 C)  TempSrc: Oral  SpO2: 96%  Weight: 200 lb 6.4 oz (90.9 kg)    Physical Exam  Constitutional: He is oriented to person, place, and time and well-developed, well-nourished, and in no distress.  HENT:  Head: Normocephalic and atraumatic.  Cardiovascular: Normal rate, regular rhythm and normal heart sounds.  No murmur heard. Pulmonary/Chest: Effort normal and breath sounds normal. He has no wheezes.  Abdominal: Soft. Bowel sounds are normal. There is no tenderness.  Neurological: He is alert and oriented to person, place, and time.  Psychiatric: Mood, memory, affect and judgment normal.  Nursing note and vitals reviewed.      Recent Results (from the past 2160 hour(s))  CBC with Differential/Platelet     Status: Abnormal   Collection Time: 05/21/17 10:23 AM  Result Value Ref Range   WBC 8.0 3.8 - 10.8 Thousand/uL   RBC 4.57 4.20 - 5.80 Million/uL   Hemoglobin 14.1 13.2 - 17.1 g/dL   HCT 41.4 38.5 - 50.0 %   MCV 90.6 80.0 - 100.0 fL   MCH 30.9 27.0 - 33.0 pg   MCHC 34.1 32.0 - 36.0 g/dL   RDW 11.9 11.0 - 15.0 %   Platelets 284 140 - 400 Thousand/uL   MPV 9.3 7.5 - 12.5 fL   Neutro Abs 4,152 1,500 - 7,800 cells/uL   Lymphs Abs 2,672 850 - 3,900 cells/uL   WBC mixed population 960 (H) 200 - 950 cells/uL    Eosinophils Absolute 192 15 - 500 cells/uL   Basophils Absolute 24 0 - 200 cells/uL   Neutrophils Relative % 51.9 %   Total Lymphocyte 33.4 %   Monocytes Relative 12.0 %   Eosinophils Relative 2.4 %   Basophils Relative 0.3 %  COMPLETE METABOLIC PANEL WITH GFR     Status: None   Collection Time: 05/21/17 10:23 AM  Result Value  Ref Range   Glucose, Bld 109 65 - 139 mg/dL    Comment: .        Non-fasting reference interval .    BUN 15 7 - 25 mg/dL   Creat 0.94 0.70 - 1.11 mg/dL    Comment: For patients >75 years of age, the reference limit for Creatinine is approximately 13% higher for people identified as African-American. .    GFR, Est Non African American 73 > OR = 60 mL/min/1.74m2   GFR, Est African American 85 > OR = 60 mL/min/1.29m2   BUN/Creatinine Ratio NOT APPLICABLE 6 - 22 (calc)   Sodium 140 135 - 146 mmol/L   Potassium 4.3 3.5 - 5.3 mmol/L   Chloride 103 98 - 110 mmol/L   CO2 28 20 - 32 mmol/L   Calcium 8.7 8.6 - 10.3 mg/dL   Total Protein 6.3 6.1 - 8.1 g/dL   Albumin 4.0 3.6 - 5.1 g/dL   Globulin 2.3 1.9 - 3.7 g/dL (calc)   AG Ratio 1.7 1.0 - 2.5 (calc)   Total Bilirubin 0.6 0.2 - 1.2 mg/dL   Alkaline phosphatase (APISO) 62 40 - 115 U/L   AST 16 10 - 35 U/L   ALT 11 9 - 46 U/L  D-Dimer, Quantitative     Status: Abnormal   Collection Time: 05/21/17 10:23 AM  Result Value Ref Range   D-Dimer, Quant 0.80 (H) <0.50 mcg/mL FEU    Comment: . The D-Dimer test is used frequently to exclude an acute PE or DVT. In patients with a low to moderate clinical risk assessment and a D-Dimer result <0.50 mcg/mL FEU, the likelihood of a PE or DVT is very low. However, a thromboembolic event should not be excluded solely on the basis of the D-Dimer level. Increased levels of D-Dimer are associated with a PE, DVT, DIC, malignancies, inflammation, sepsis, surgery, trauma, pregnancy, and advancing patient age. [Jama 2006 11:295(2):199-207] . For additional information,  please refer to: http://education.questdiagnostics.com/faq/FAQ149 (This link is being provided for informational/ educational purposes only) .   B Nat Peptide     Status: None   Collection Time: 05/22/17  2:59 PM  Result Value Ref Range   Brain Natriuretic Peptide 28 <100 pg/mL    Comment: . BNP levels increase with age in the general population with the highest values seen in individuals greater than 38 years of age. Reference: J. Am. [place] Ar. Cardiol. 2002; 65:681-275. .      Assessment & Plan  1. Arteriosclerosis of coronary artery Continue on Plavix, reviewed cardiology notes - clopidogrel (PLAVIX) 75 MG tablet; 1 (one) Tablet, Oral, daily  Dispense: 90 tablet; Refill: 1  2. Orthostatic dizziness Suspect a component of orthostatic dizziness because of deconditioning, advised increased fluid intake  3. Dyspnea, unspecified type Acutely multifactorial, cardiology workup has been unremarkable, normal oxygen saturations, reassured and will evaluate at next visit  [place] 06/24/2017 11:43 AM

## 2017-06-25 ENCOUNTER — Other Ambulatory Visit: Payer: Self-pay | Admitting: Family Medicine

## 2017-07-10 ENCOUNTER — Telehealth: Payer: Self-pay | Admitting: Family Medicine

## 2017-07-10 DIAGNOSIS — C4441 Basal cell carcinoma of skin of scalp and neck: Secondary | ICD-10-CM | POA: Diagnosis not present

## 2017-07-10 DIAGNOSIS — L905 Scar conditions and fibrosis of skin: Secondary | ICD-10-CM | POA: Diagnosis not present

## 2017-07-10 NOTE — Telephone Encounter (Signed)
Patient should be seen to fully evaluate his symptoms and prescribe appropriate treatment.

## 2017-07-10 NOTE — Telephone Encounter (Signed)
Copied from [place]. Topic: Inquiry >> Jul 10, 2017 12:33 PM Neva Seat wrote: Throat burn/sore and excessive coughing. Pt wants to know if Dr. Manuella Ghazi can call him in something to help.

## 2017-07-10 NOTE — Telephone Encounter (Signed)
appt made for 07/11/17 with dr Manuella Ghazi

## 2017-07-11 ENCOUNTER — Encounter: Payer: Self-pay | Admitting: Family Medicine

## 2017-07-11 ENCOUNTER — Ambulatory Visit: Payer: PPO | Admitting: Family Medicine

## 2017-07-11 VITALS — BP 112/64 | HR 78 | Temp 97.5°F | Ht 72.0 in | Wt 199.5 lb

## 2017-07-11 DIAGNOSIS — R05 Cough: Secondary | ICD-10-CM | POA: Diagnosis not present

## 2017-07-11 DIAGNOSIS — R0982 Postnasal drip: Secondary | ICD-10-CM | POA: Diagnosis not present

## 2017-07-11 DIAGNOSIS — H2513 Age-related nuclear cataract, bilateral: Secondary | ICD-10-CM | POA: Diagnosis not present

## 2017-07-11 DIAGNOSIS — R058 Other specified cough: Secondary | ICD-10-CM

## 2017-07-11 MED ORDER — BENZONATATE 100 MG PO CAPS: 100.0000 mg | ORAL_CAPSULE | Freq: Three times a day (TID) | ORAL | 0 refills | Status: DC | PRN

## 2017-07-11 MED ORDER — AMOXICILLIN-POT CLAVULANATE 875-125 MG PO TABS: 1.0000 | ORAL_TABLET | Freq: Two times a day (BID) | ORAL | 0 refills | Status: DC

## 2017-07-11 NOTE — Progress Notes (Signed)
Name: Mark [name]   MRN: [id number]    DOB: 08-13-1928   Date:07/11/2017       Progress Note  Subjective  Chief Complaint  Chief Complaint  Patient presents with  . Cough    sx started x 3 days ago, pt has been taking tussionex it has been helping him, Denies fever,nasal congestion,   . chest congestion    slight chest tightness Denies chest pain     Cough  This is a new problem. The current episode started in the past 7 days (6 days ago). The problem has been unchanged. The cough is non-productive. Associated symptoms include postnasal drip and shortness of breath. Pertinent negatives include no chills, ear pain or fever. He has tried OTC cough suppressant (has taken Tussin for relief) for the symptoms.      Past Medical History:  Diagnosis Date  . Arthritis   . Carotid artery disease ([place])   . Coronary artery disease 2005  . Hyperkalemia   . Hyperlipidemia   . Hypertension   . Past heart attack 2005    Past Surgical History:  Procedure Laterality Date  . CORONARY ANGIOPLASTY WITH STENT PLACEMENT  2005   [facility]    Family History  Problem Relation Age of Onset  . Hypertension Mother   . Heart disease Mother   . Heart disease Father   . Hypertension Father     Social History   Socioeconomic History  . Marital status: Widowed    Spouse name: Not on file  . Number of children: Not on file  . Years of education: Not on file  . Highest education level: Not on file  Social Needs  . Financial resource strain: Not on file  . Food insecurity - worry: Not on file  . Food insecurity - inability: Not on file  . Transportation needs - medical: Not on file  . Transportation needs - non-medical: Not on file  Occupational History  . Not on file  Tobacco Use  . Smoking status: Former Smoker    Last attempt to quit: 05/16/1963    Years since quitting: 54.1  . Smokeless tobacco: Never Used  . Tobacco comment: quit over 50 years ago  Substance and Sexual Activity  .  Alcohol use: No  . Drug use: No  . Sexual activity: Not Currently  Other Topics Concern  . Not on file  Social History Narrative  . Not on file     Current Outpatient Medications:  .  acetaminophen (TYLENOL) 500 MG tablet, Take 500 mg by mouth every 6 (six) hours as needed. , Disp: , Rfl:  .  aspirin EC 81 MG tablet, Take 81 mg by mouth daily. , Disp: , Rfl:  .  atorvastatin (LIPITOR) 10 MG tablet, Take 1 tablet (10 mg total) by mouth daily., Disp: 90 tablet, Rfl: 0 .  clopidogrel (PLAVIX) 75 MG tablet, 1 (one) Tablet, Oral, daily, Disp: 90 tablet, Rfl: 1 .  fluticasone (FLONASE) 50 MCG/ACT nasal spray, Place into the nose as needed. , Disp: , Rfl:  .  furosemide (LASIX) 20 MG tablet, Take 1 tablet (20 mg total) by mouth 2 (two) times daily., Disp: 180 tablet, Rfl: 0 .  gabapentin (NEURONTIN) 400 MG capsule, Take 400 mg by mouth 2 (two) times daily. , Disp: , Rfl:  .  Loratadine 10 MG CAPS, Take 10 mg by mouth daily. , Disp: , Rfl:  .  metoprolol tartrate (LOPRESSOR) 50 MG tablet, Take 1 tablet (50  mg total) by mouth 2 (two) times daily., Disp: 180 tablet, Rfl: 1 .  neomycin-polymyxin-hydrocortisone (CORTISPORIN) otic solution, Place 3 drops into both ears at bedtime as needed., Disp: 10 mL, Rfl: 0 .  pantoprazole (PROTONIX) 40 MG tablet, Take 1 tablet (40 mg total) by mouth daily., Disp: 90 tablet, Rfl: 0 .  pantoprazole (PROTONIX) 40 MG tablet, Take 1 tablet (40 mg total) by mouth daily., Disp: 90 tablet, Rfl: 0 .  pantoprazole (PROTONIX) 40 MG tablet, Take 1 tablet (40 mg total) by mouth daily., Disp: 90 tablet, Rfl: 0 .  PROAIR HFA 108 (90 Base) MCG/ACT inhaler, Inhale 2 puffs into the lungs every 6 (six) hours as needed for wheezing or shortness of breath., Disp: 8.5 g, Rfl: 0 .  Probiotic Product (PROBIOTIC PO), Take by mouth daily. , Disp: , Rfl:  .  doxycycline (VIBRAMYCIN) 100 MG capsule, Take 1 capsule by mouth daily. For seven days, Disp: , Rfl:   Allergies  Allergen Reactions   . Rosuvastatin Rash and Other (See Comments)    No reaction per pt, He saw some information regarding Crestor and adverse effects No reaction per pt, He saw some information regarding Crestor and adverse effects No reaction per pt, He saw some information regarding Crestor and adverse effects No reaction per pt, He saw some information regarding Crestor and adverse effects No reaction per pt, He saw some information regarding Crestor and adverse effects     Review of Systems  Constitutional: Negative for chills and fever.  HENT: Positive for postnasal drip. Negative for ear pain.   Respiratory: Positive for cough and shortness of breath.       Objective  Vitals:   07/11/17 1153  BP: 112/64  Pulse: 78  Temp: (!) 97.5 F (36.4 C)  TempSrc: Oral  SpO2: 94%  Weight: 199 lb 8 oz (90.5 kg)  Height: 6' (1.829 m)    Physical Exam  Constitutional: He is oriented to person, place, and time and well-developed, well-nourished, and in no distress.  HENT:  Head: Normocephalic and atraumatic.  Right Ear: Tympanic membrane and ear canal normal.  Left Ear: Tympanic membrane and ear canal normal.  Mouth/Throat: Posterior oropharyngeal erythema present.  Cardiovascular: Normal rate, regular rhythm and normal heart sounds.  No murmur heard. Pulmonary/Chest: Effort normal. He has no wheezes. He has rales.  Mild crackles in right lower lung fields.   Neurological: He is alert and oriented to person, place, and time.  Psychiatric: Mood, memory, affect and judgment normal.  Nursing note and vitals reviewed.    Assessment & Plan  1. Dry cough Unlikely to be pneumonia, he has normal vital signs, denies any fevers and chills, starting benzonatate for treatment - benzonatate (TESSALON) 100 MG capsule; Take 1 capsule (100 mg total) by mouth 3 (three) times daily as needed for cough.  Dispense: 20 capsule; Refill: 0  2. Post-nasal drainage With some symptoms of sinus drainage, started on  antibiotics - amoxicillin-clavulanate (AUGMENTIN) 875-125 MG tablet; Take 1 tablet by mouth 2 (two) times daily.  Dispense: 20 tablet; Refill: 0   Dawnna Gritz [facility] 07/11/2017 12:01 PM

## 2017-07-17 DIAGNOSIS — D0462 Carcinoma in situ of skin of left upper limb, including shoulder: Secondary | ICD-10-CM | POA: Diagnosis not present

## 2017-07-28 ENCOUNTER — Other Ambulatory Visit: Payer: Self-pay

## 2017-07-28 ENCOUNTER — Encounter: Payer: Self-pay | Admitting: *Deleted

## 2017-07-28 ENCOUNTER — Ambulatory Visit
Admission: EM | Admit: 2017-07-28 | Discharge: 2017-07-28 | Disposition: A | Discharge status: Home / Self Care | Payer: PPO | Attending: Family Medicine | Admitting: Family Medicine

## 2017-07-28 DIAGNOSIS — Z23 Encounter for immunization: Secondary | ICD-10-CM | POA: Diagnosis not present

## 2017-07-28 DIAGNOSIS — S61211A Laceration without foreign body of left index finger without damage to nail, initial encounter: Secondary | ICD-10-CM

## 2017-07-28 DIAGNOSIS — W268XXA Contact with other sharp object(s), not elsewhere classified, initial encounter: Secondary | ICD-10-CM | POA: Diagnosis not present

## 2017-07-28 MED ORDER — CEPHALEXIN 500 MG PO CAPS: 500.0000 mg | ORAL_CAPSULE | Freq: Three times a day (TID) | ORAL | 0 refills | Status: AC

## 2017-07-28 MED ORDER — TETANUS-DIPHTH-ACELL PERTUSSIS 5-2.5-18.5 LF-MCG/0.5 IM SUSP
0.5000 mL | Freq: Once | INTRAMUSCULAR | Status: AC
  Administered 2017-07-28: 0.5 mL via INTRAMUSCULAR

## 2017-07-28 MED ORDER — TETANUS-DIPHTHERIA TOXOIDS TD 5-2 LFU IM INJ: 0.5000 mL | INJECTION | Freq: Once | INTRAMUSCULAR | Status: DC

## 2017-07-28 MED ORDER — MUPIROCIN 2 % EX OINT: TOPICAL_OINTMENT | CUTANEOUS | 0 refills | Status: DC

## 2017-07-28 NOTE — Discharge Instructions (Signed)
Take medication as prescribed. Keep clean. Monitor.  ° °Follow up with your primary care physician this week as needed. Return to Urgent care for new or worsening concerns.  ° °

## 2017-07-28 NOTE — ED Provider Notes (Addendum)
[facility] ____________________________________________  Time seen: Approximately 11:00 AM  I have reviewed the triage vital signs and the nursing notes.   HISTORY  Chief Complaint Laceration  HPI Mark [name] is a 83 y.o. male presenting for evaluation of left index finger laceration that occurred yesterday afternoon.  Patient reports that he had someone over with a washer sprayer on his roof.  States while the person was up there the washer started to fall, and the patient tried to catch it, and in the process the plastic edge cut his finger.  Patient denies any fall or other injury.  States that he cleaned the area and wrapped it up last night.  States that he thought it was healing fine however it reopened when he was in the shower today causing bleeding prompted him to come in.  Denies concern of foreign body.  States minimal tenderness to laceration only.  Denies decreased range of motion, paresthesias or other skin break.  Unsure of last tetanus immunization.  No medications applied to the same area.  Denies other aggravating or alleviating factors.  Reports otherwise feels well. Denies renal insufficiency.  Roselee Nova, MD: PCP    Past Medical History:  Diagnosis Date  . Arthritis   . Carotid artery disease ([place])   . Coronary artery disease 2005  . Hyperkalemia   . Hyperlipidemia   . Hypertension   . Past heart attack 2005    Patient Active Problem List   Diagnosis Date Noted  . Carotid stenosis 02/17/2017  . Right leg paresthesias 11/26/2016  . Hypertension 08/27/2016  . GERD (gastroesophageal reflux disease) 08/27/2016  . History of ear pain 08/27/2016  . Hyperglycemia 05/10/2016  . Wheezing on auscultation 10/11/2015  . Pain in right shoulder 09/13/2015  . Dyspnea 08/05/2015  . Osteoarthritis 07/06/2015  . Leg swelling 05/30/2015  . Hyperkalemia 04/01/2015  . Hyperlipidemia 03/29/2015  . Family history of type 2 diabetes mellitus in  father 03/29/2015  . Arthritis 02/03/2015  . DD (diverticular disease) 02/03/2015  . DDD (degenerative disc disease), lumbar 03/03/2014  . Arteriosclerosis of coronary artery 04/18/2012    Past Surgical History:  Procedure Laterality Date  . CORONARY ANGIOPLASTY WITH STENT PLACEMENT  2005   [facility]     No current facility-administered medications for this encounter.   Current Outpatient Medications:  .  acetaminophen (TYLENOL) 500 MG tablet, Take 500 mg by mouth every 6 (six) hours as needed. , Disp: , Rfl:  .  aspirin EC 81 MG tablet, Take 81 mg by mouth daily. , Disp: , Rfl:  .  atorvastatin (LIPITOR) 10 MG tablet, Take 1 tablet (10 mg total) by mouth daily., Disp: 90 tablet, Rfl: 0 .  clopidogrel (PLAVIX) 75 MG tablet, 1 (one) Tablet, Oral, daily, Disp: 90 tablet, Rfl: 1 .  fluticasone (FLONASE) 50 MCG/ACT nasal spray, Place into the nose as needed. , Disp: , Rfl:  .  furosemide (LASIX) 20 MG tablet, Take 1 tablet (20 mg total) by mouth 2 (two) times daily., Disp: 180 tablet, Rfl: 0 .  gabapentin (NEURONTIN) 400 MG capsule, Take 400 mg by mouth 2 (two) times daily. , Disp: , Rfl:  .  Loratadine 10 MG CAPS, Take 10 mg by mouth daily. , Disp: , Rfl:  .  metoprolol tartrate (LOPRESSOR) 50 MG tablet, Take 1 tablet (50 mg total) by mouth 2 (two) times daily., Disp: 180 tablet, Rfl: 1 .  pantoprazole (PROTONIX) 40 MG tablet, Take 1 tablet (40 mg  total) by mouth daily., Disp: 90 tablet, Rfl: 0 .  PROAIR HFA 108 (90 Base) MCG/ACT inhaler, Inhale 2 puffs into the lungs every 6 (six) hours as needed for wheezing or shortness of breath., Disp: 8.5 g, Rfl: 0 .  Probiotic Product (PROBIOTIC PO), Take by mouth daily. , Disp: , Rfl:  .  cephALEXin (KEFLEX) 500 MG capsule, Take 1 capsule (500 mg total) by mouth 3 (three) times daily for 5 days., Disp: 15 capsule, Rfl: 0 .  mupirocin ointment (BACTROBAN) 2 %, Apply two times a day for 7 days., Disp: 22 g, Rfl: 0  Allergies Rosuvastatin  Family  History  Problem Relation Age of Onset  . Hypertension Mother   . Heart disease Mother   . Heart disease Father   . Hypertension Father     Social History Social History   Tobacco Use  . Smoking status: Former Smoker    Last attempt to quit: 05/16/1963    Years since quitting: 54.2  . Smokeless tobacco: Never Used  . Tobacco comment: quit over 50 years ago  Substance Use Topics  . Alcohol use: No  . Drug use: No    Review of Systems Constitutional: No fever/chills Cardiovascular: Denies chest pain. Respiratory: Denies shortness of breath. Musculoskeletal: Negative for back pain. Skin: As above.   ____________________________________________   PHYSICAL EXAM:  VITAL SIGNS: ED Triage Vitals  Enc Vitals Group     BP 07/28/17 1017 116/66     Pulse Rate 07/28/17 1017 66     Resp 07/28/17 1017 16     Temp 07/28/17 1017 98.1 F (36.7 C)     Temp Source 07/28/17 1017 Oral     SpO2 07/28/17 1017 97 %     Weight --      Height --      Head Circumference --      Peak Flow --      Pain Score 07/28/17 1020 0     Pain Loc --      Pain Edu? --      Excl. in [place]? --     Constitutional: Alert and oriented. Well appearing and in no acute distress. Cardiovascular: Normal rate, regular rhythm. Grossly normal heart sounds.  Good peripheral circulation. Respiratory: Normal respiratory effort without tachypnea nor retractions. Breath sounds are clear and equal bilaterally. No wheezes, rales, rhonchi. Musculoskeletal: Steady gait. Neurologic:  Normal speech and language.Speech is normal. No gait instability.  Skin:  Skin is warm, dry.  Except: left index finger distal phalanx palmer aspect 2cm linear superficial laceration present, minimal tenderness to palpation, able to visualize wound base, no foreign body noted, no bony tenderness, full range of motion present, no motor or tendon deficit, normal distal sensation and capillary refill.  Psychiatric: Mood and affect are normal.  Speech and behavior are normal. Patient exhibits appropriate insight and judgment   ___________________________________________   LABS (all labs ordered are listed, but only abnormal results are displayed)  Labs Reviewed - No data to display  PROCEDURES Procedures   Procedure(s) performed:  Procedure explained and verbal consent obtained. Consent: Verbal consent obtained. Written consent not obtained. Risks and benefits: risks, benefits and alternatives were discussed Patient identity confirmed: verbally with patient and hospital-assigned identification number  Consent given by: patient   Laceration Repair Location: left index finger Length: 2 cm Foreign bodies: no foreign bodies Tendon involvement: none Nerve involvement: none Preparation: Patient was prepped and draped in the usual sterile fashion. Anesthesia: none Irrigation solution:  saline and betadine  Irrigation method: jet lavage Amount of cleaning: copious Repaired x 3 steristips with wound adhesive at edges of steristrip  Approximation: loose Patient tolerate well. Wound well approximated post repair.  Antibiotic ointment and dressing applied.  Wound care instructions provided.  Observe for any signs of infection or other problems.     INITIAL IMPRESSION / ASSESSMENT AND PLAN / ED COURSE  Pertinent labs & imaging results that were available during my care of the patient were reviewed by me and considered in my medical decision making (see chart for details).  Well-appearing patient.  Patient sustained laceration to left index finger yesterday afternoon while at home.  No bony tenderness, no appearance of foreign body.  Patient declines x-ray.  Wound reopened, copiously cleaned and irrigated.  Repaired as above.  Due to timeframe, will start patient on empiric Keflex and topical Bactroban.  Tetanus immunization updated.  Discussed wound care, monitoring and follow-up return parameters.Discussed indication, risks and  benefits of medications with patient.  Discussed follow up with Primary care physician this week. Discussed follow up and return parameters including no resolution or any worsening concerns. Patient verbalized understanding and agreed to plan.   ____________________________________________   FINAL CLINICAL IMPRESSION(S) / ED DIAGNOSES  Final diagnoses:  Laceration of left index finger without foreign body without damage to nail, initial encounter     ED Discharge Orders        Ordered    cephALEXin (KEFLEX) 500 MG capsule  3 times daily     07/28/17 1129    mupirocin ointment (BACTROBAN) 2 %     07/28/17 1129       Note: This dictation was prepared with Dragon dictation along with smaller phrase technology. Any transcriptional errors that result from this process are unintentional.         Marylene Land, NP 07/28/17 1308

## 2017-07-28 NOTE — ED Triage Notes (Signed)
Patient lacerated the tip of his left index finger when he tried to catch a falling object yesterday at 12:30.

## 2017-08-10 ENCOUNTER — Other Ambulatory Visit: Payer: Self-pay | Admitting: Family Medicine

## 2017-08-10 DIAGNOSIS — R062 Wheezing: Secondary | ICD-10-CM

## 2017-08-25 ENCOUNTER — Other Ambulatory Visit: Payer: Self-pay | Admitting: Family Medicine

## 2017-08-25 DIAGNOSIS — E7800 Pure hypercholesterolemia, unspecified: Secondary | ICD-10-CM

## 2017-09-12 ENCOUNTER — Ambulatory Visit: Payer: Self-pay

## 2017-09-12 NOTE — Telephone Encounter (Addendum)
Contacted pt regarding his cough, hoarseness and sore throat; he states that his sore throat is worse in the morning; he states he has been taking robitussin every 8 hours and it helps; nurse triage initiated and recommendations made per protocol to include seeing physician within 3 days; pt declined disposition and states that he will call back in a few days if it does not get better; pt also refused offer to be given the first available appointment.   Reason for Disposition . [1] Sore throat with cough/cold symptoms AND [2] present > 5 days  Answer Assessment - Initial Assessment Questions 1. ONSET: "When did the throat start hurting?" (Hours or days ago)      Days 09/08/17 2. SEVERITY: "How bad is the sore throat?" (Scale 1-10; mild, moderate or severe)   - MILD (1-3):  doesn't interfere with eating or normal activities   - MODERATE (4-7): interferes with eating some solids and normal activities   - SEVERE (8-10):  excruciating pain, interferes with most normal activities   - SEVERE DYSPHAGIA: can't swallow liquids, drooling     Moderate; more difficult to swallow 3. STREP EXPOSURE: "Has there been any exposure to strep within the past week?" If so, ask: "What type of contact occurred?"      no 4.  VIRAL SYMPTOMS: "Are there any symptoms of a cold, such as a runny nose, cough, hoarse voice or red eyes?"      Cough, hoarse voice 5. FEVER: "Do you have a fever?" If so, ask: "What is your temperature, how was it measured, and when did it start?"     no 6. PUS ON THE TONSILS: "Is there pus on the tonsils in the back of your throat?"     unsure 7. OTHER SYMPTOMS: "Do you have any other symptoms?" (e.g., difficulty breathing, headache, rash)     Painful to swallw 8. PREGNANCY: "Is there any chance you are pregnant?" "When was your last menstrual period?"     n/a  Protocols used: SORE THROAT-A-AH

## 2017-09-18 ENCOUNTER — Ambulatory Visit
Admission: RE | Admit: 2017-09-18 | Discharge: 2017-09-18 | Disposition: A | Discharge status: Home / Self Care | Payer: PPO | Source: Ambulatory Visit | Attending: Family Medicine | Admitting: Family Medicine

## 2017-09-18 ENCOUNTER — Encounter: Payer: Self-pay | Admitting: Family Medicine

## 2017-09-18 ENCOUNTER — Telehealth: Payer: Self-pay

## 2017-09-18 ENCOUNTER — Ambulatory Visit (INDEPENDENT_AMBULATORY_CARE_PROVIDER_SITE_OTHER): Payer: PPO | Admitting: Family Medicine

## 2017-09-18 VITALS — BP 130/72 | HR 66 | Temp 97.5°F | Ht 72.0 in | Wt 200.6 lb

## 2017-09-18 DIAGNOSIS — R059 Cough, unspecified: Secondary | ICD-10-CM

## 2017-09-18 DIAGNOSIS — R05 Cough: Secondary | ICD-10-CM | POA: Insufficient documentation

## 2017-09-18 DIAGNOSIS — J029 Acute pharyngitis, unspecified: Secondary | ICD-10-CM | POA: Diagnosis not present

## 2017-09-18 LAB — POCT RAPID STREP A (OFFICE): RAPID STREP A SCREEN: NEGATIVE

## 2017-09-18 NOTE — Progress Notes (Signed)
BP 130/72 (BP Location: Left Arm, Patient Position: Sitting, Cuff Size: Large)   Pulse 66   Temp (!) 97.5 F (36.4 C) (Oral)   Ht 6' (1.829 m)   Wt 200 lb 9.6 oz (91 kg)   SpO2 99%   BMI 27.21 kg/m    Subjective:    Patient ID: Mark [name], male    DOB: 02/13/1929, 84 y.o.   MRN: [id number]  HPI: Mark [name] is a 84 y.o. male  Chief Complaint  Patient presents with  . Cough    x 1 week, non productive, minimal nasal drainage, clear, some SOB w/ cough, denies fever or chills   . Immunizations    discuss PNA vaccine    HPI Sick for week; cough, brought up some stuff this morning, mostly nothing there; coughs so hard he gags Visits nursing homes Some wheezing SHOB, no fever or chills No body aches Using Proair, using PRN, helpful with this illness Using more 5-7 x this week Ears are okay; did have some sore throat, but not sore right now, just a tiny bit; was so bad last week, lost his voice  Depression screen [facility] 2/9 09/18/2017 06/24/2017 05/21/2017 04/15/2017 02/26/2017  Decreased Interest 0 0 0 0 0  Down, Depressed, Hopeless 0 0 0 0 0  PHQ - 2 Score 0 0 0 0 0    Relevant past medical, surgical, family and social history reviewed Past Medical History:  Diagnosis Date  . Arthritis   . Carotid artery disease ([place])   . Coronary artery disease 2005  . Hyperkalemia   . Hyperlipidemia   . Hypertension   . Past heart attack 2005   Past Surgical History:  Procedure Laterality Date  . CORONARY ANGIOPLASTY WITH STENT PLACEMENT  2005   [facility]   Family History  Problem Relation Age of Onset  . Hypertension Mother   . Heart disease Mother   . Heart disease Father   . Hypertension Father    Social History   Tobacco Use  . Smoking status: Former Smoker    Last attempt to quit: 05/16/1963    Years since quitting: 54.3  . Smokeless tobacco: Never Used  . Tobacco comment: quit over 50 years ago  Substance Use Topics  . Alcohol use: No  . Drug use: No     Interim medical history since last visit reviewed. Allergies and medications reviewed  Review of Systems Per HPI unless specifically indicated above     Objective:    BP 130/72 (BP Location: Left Arm, Patient Position: Sitting, Cuff Size: Large)   Pulse 66   Temp (!) 97.5 F (36.4 C) (Oral)   Ht 6' (1.829 m)   Wt 200 lb 9.6 oz (91 kg)   SpO2 99%   BMI 27.21 kg/m   Wt Readings from Last 3 Encounters:  09/18/17 200 lb 9.6 oz (91 kg)  07/11/17 199 lb 8 oz (90.5 kg)  06/24/17 200 lb 6.4 oz (90.9 kg)    Physical Exam  Constitutional: He appears well-developed and well-nourished. No distress.  HENT:  Right Ear: Tympanic membrane and ear canal normal.  Left Ear: Tympanic membrane and ear canal normal.  Nose: Rhinorrhea (clear, scant) present.  Mouth/Throat: Posterior oropharyngeal erythema present. No oropharyngeal exudate or posterior oropharyngeal edema.  Eyes: No scleral icterus.  Cardiovascular: Normal rate and regular rhythm.  Pulmonary/Chest: Effort normal and breath sounds normal. He has no wheezes.  Abdominal: He exhibits no distension.  Lymphadenopathy:  He has cervical adenopathy ([place]).  Neurological: He is alert.  Skin: He is not diaphoretic. No pallor.  Psychiatric: He has a normal mood and affect.      Assessment & Plan:   Problem List Items Addressed This Visit    None    Visit Diagnoses    Cough    -  Primary   will get CXR; suspect viral illness; do not suspect flu though; use SABA if needed   Relevant Orders   DG Chest 2 View   Sore throat       doubt group A strep, but pt reports it was severe last week; rapid and culture collected   Relevant Orders   POCT rapid strep A   Culture, Group A Strep       Follow up plan: No Follow-up on file.  An after-visit summary was printed and given to the patient at [place].  Please see the patient instructions which may contain other information and recommendations beyond what is mentioned above  in the assessment and plan.  No orders of the defined types were placed in this encounter.   Orders Placed This Encounter  Procedures  . Culture, Group A Strep  . DG Chest 2 View  . POCT rapid strep A

## 2017-09-18 NOTE — Telephone Encounter (Signed)
Called pt informed him of results. Pt gave verbal understanding.

## 2017-09-18 NOTE — Patient Instructions (Signed)
Please have the chest xray done today If you have not heard anything from my staff tomorrow about any orders/referrals/studies from today, please contact us here to follow-up [phone] Try vitamin C (orange juice if not diabetic or vitamin C tablets) and drink green tea to help your immune system during your illness Get plenty of rest and hydration

## 2017-09-18 NOTE — Telephone Encounter (Signed)
-----   Message from Arnetha Courser, MD sent at 09/18/2017  2:47 PM EST ----- Please let patient know that his chest xray did not show infection; the lungs look fine; we hope he feels better soon

## 2017-09-19 ENCOUNTER — Telehealth: Payer: Self-pay | Admitting: Family Medicine

## 2017-09-19 MED ORDER — BENZONATATE 100 MG PO CAPS: 100.0000 mg | ORAL_CAPSULE | Freq: Three times a day (TID) | ORAL | 0 refills | Status: DC | PRN

## 2017-09-19 NOTE — Telephone Encounter (Signed)
Pt.notified

## 2017-09-19 NOTE — Telephone Encounter (Signed)
Copied from [place] [id number]. Topic: Quick Communication - See Telephone Encounter >> Sep 19, 2017 10:16 AM Synthia Innocent wrote: CRM for notification. See Telephone encounter for:  Patient seen yesterday, would like something called in for his cough. [employer]. Please advise 09/19/17.

## 2017-09-19 NOTE — Telephone Encounter (Signed)
Please let him know I sent the Rx Thank you

## 2017-09-20 ENCOUNTER — Telehealth: Payer: Self-pay

## 2017-09-20 LAB — CULTURE, GROUP A STREP
MICRO NUMBER: 90225811
SPECIMEN QUALITY: ADEQUATE

## 2017-09-20 NOTE — Telephone Encounter (Signed)
-----   Message from Arnetha Courser, MD sent at 09/20/2017  9:05 AM EST ----- Please let patient know that his throat culture was negative for strep; we hope he is feeling better soon

## 2017-09-20 NOTE — Telephone Encounter (Signed)
Called pt informed of normal results, he states that he is doing better with tessalon but is still coughing. Informed pt to call us back if things do not get better.

## 2017-10-11 ENCOUNTER — Ambulatory Visit (INDEPENDENT_AMBULATORY_CARE_PROVIDER_SITE_OTHER): Payer: PPO | Admitting: Family Medicine

## 2017-10-11 ENCOUNTER — Ambulatory Visit: Payer: Self-pay

## 2017-10-11 ENCOUNTER — Encounter: Payer: Self-pay | Admitting: Family Medicine

## 2017-10-11 VITALS — BP 110/74 | HR 71 | Temp 97.5°F | Ht 72.0 in | Wt 201.8 lb

## 2017-10-11 DIAGNOSIS — R05 Cough: Secondary | ICD-10-CM

## 2017-10-11 DIAGNOSIS — R059 Cough, unspecified: Secondary | ICD-10-CM

## 2017-10-11 DIAGNOSIS — R062 Wheezing: Secondary | ICD-10-CM | POA: Diagnosis not present

## 2017-10-11 MED ORDER — DOXYCYCLINE HYCLATE 100 MG PO TABS: 100.0000 mg | ORAL_TABLET | Freq: Two times a day (BID) | ORAL | 0 refills | Status: DC

## 2017-10-11 MED ORDER — PREDNISONE 20 MG PO TABS: 20.0000 mg | ORAL_TABLET | Freq: Every day | ORAL | 0 refills | Status: DC

## 2017-10-11 NOTE — Patient Instructions (Signed)
Start the antibiotic Start the lower dose prednisone to help with wheezing Please do eat yogurt or kimchi or take a probiotic daily for the next month We want to replace the healthy germs in the gut If you notice foul, watery diarrhea in the next two months, schedule an appointment RIGHT AWAY or go to an urgent care or the emergency room if a holiday or over a weekend If you are not better in one week, then let me know and we'll get you to a pulmonologist

## 2017-10-11 NOTE — Telephone Encounter (Signed)
States he has had "this cough for 5 weeks and the Mucinex is helping a little." "I feel short of breath when I bend over to tie my shoes." Request an appointment for today.  Reason for Disposition . [1] Continuous (nonstop) coughing interferes with work or school AND [2] no improvement using cough treatment per Care Advice  Answer Assessment - Initial Assessment Questions 1. ONSET: "When did the cough begin?"      Started 5 weeks ago 2. SEVERITY: "How bad is the cough today?"      Not severe 3. RESPIRATORY DISTRESS: "Describe your breathing."      Breathing hard 4. FEVER: "Do you have a fever?" If so, ask: "What is your temperature, how was it measured, and when did it start?"     No 5. SPUTUM: "Describe the color of your sputum" (clear, white, yellow, green)      Clear 6. HEMOPTYSIS: "Are you coughing up any blood?" If so ask: "How much?" (flecks, streaks, tablespoons, etc.)     No 7. CARDIAC HISTORY: "Do you have any history of heart disease?" (e.g., heart attack, congestive heart failure)      HTN, Heart attack with stent 8. LUNG HISTORY: "Do you have any history of lung disease?"  (e.g., pulmonary embolus, asthma, emphysema)     No 9. PE RISK FACTORS: "Do you have a history of blood clots?" (or: recent major surgery, recent prolonged travel, bedridden )     No 10. OTHER SYMPTOMS: "Do you have any other symptoms?" (e.g., runny nose, wheezing, chest pain)       Wheezing 11. PREGNANCY: "Is there any chance you are pregnant?" "When was your last menstrual period?"       No 12. TRAVEL: "Have you traveled out of the country in the last month?" (e.g., travel history, exposures)       No  Protocols used: [place]

## 2017-10-11 NOTE — Progress Notes (Signed)
BP 110/74 (BP Location: Right Arm, Patient Position: Sitting, Cuff Size: Large)   Pulse 71   Temp (!) 97.5 F (36.4 C) (Oral)   Ht 6' (1.829 m)   Wt 201 lb 12.8 oz (91.5 kg)   SpO2 97%   BMI 27.37 kg/m    Subjective:    Patient ID: Mark [name], male    DOB: [birth year], 84 y.o.   MRN: [id number]  HPI: Mark [name] is a 84 y.o. male  Chief Complaint  Patient presents with  . Cough    Pt states that he has been coughing x 5 weeks     HPI Patient has been coughing for about five weeks Having some cough; not hardly productive; just sometimes; white or clear Taking mucinex Drinking plenty of water No fever; wheezing Does not feel bad; says it's in his "bronichal tubes" Short of breath; no night sweats; no unexplained weight loss I asked patient about the "melena" on his problem list; he does not recall that at all and doesn't think that was right; he is on a PPI; no problems with his stomach currently, no blood in stool, no ulcer  Chest xray 09/18/17: CLINICAL DATA:  Cough  EXAM: CHEST  2 VIEW  COMPARISON:  05/21/2017  FINDINGS: The heart size and mediastinal contours are within normal limits. Both lungs are clear. The visualized skeletal structures are unremarkable.  IMPRESSION: No active cardiopulmonary disease.   Electronically Signed   By: Inez Catalina M.D.   On: 09/18/2017 13:23  Depression screen [facility] 2/9 09/18/2017 06/24/2017 05/21/2017 04/15/2017 02/26/2017  Decreased Interest 0 0 0 0 0  Down, Depressed, Hopeless 0 0 0 0 0  PHQ - 2 Score 0 0 0 0 0    Relevant past medical, surgical, family and social history reviewed Past Medical History:  Diagnosis Date  . Arthritis   . Carotid artery disease ([place])   . Coronary artery disease 2005  . Hyperkalemia   . Hyperlipidemia   . Hypertension   . Past heart attack 2005   Past Surgical History:  Procedure Laterality Date  . CORONARY ANGIOPLASTY WITH STENT PLACEMENT  2005   [facility]   Family  History  Problem Relation Age of Onset  . Hypertension Mother   . Heart disease Mother   . Heart disease Father   . Hypertension Father    Social History   Tobacco Use  . Smoking status: Former Smoker    Last attempt to quit: 05/16/1963    Years since quitting: 54.4  . Smokeless tobacco: Never Used  . Tobacco comment: quit over 50 years ago  Substance Use Topics  . Alcohol use: No  . Drug use: No    Interim medical history since last visit reviewed. Allergies and medications reviewed  Review of Systems Per HPI unless specifically indicated above     Objective:    BP 110/74 (BP Location: Right Arm, Patient Position: Sitting, Cuff Size: Large)   Pulse 71   Temp (!) 97.5 F (36.4 C) (Oral)   Ht 6' (1.829 m)   Wt 201 lb 12.8 oz (91.5 kg)   SpO2 97%   BMI 27.37 kg/m   Wt Readings from Last 3 Encounters:  10/11/17 201 lb 12.8 oz (91.5 kg)  09/18/17 200 lb 9.6 oz (91 kg)  07/11/17 199 lb 8 oz (90.5 kg)    Physical Exam  Constitutional: He appears well-developed and well-nourished. No distress.  HENT:  Right Ear: Tympanic membrane and ear  canal normal.  Left Ear: Tympanic membrane and ear canal normal.  Nose: Rhinorrhea (clear, scant) present.  Mouth/Throat: No oropharyngeal exudate, posterior oropharyngeal edema or posterior oropharyngeal erythema.  Eyes: No scleral icterus.  Cardiovascular: Normal rate and regular rhythm.  Pulmonary/Chest: Effort normal and breath sounds normal. He has no decreased breath sounds. He has no wheezes. He has no rhonchi.  Abdominal: He exhibits no distension.  Neurological: He is alert.  Skin: He is not diaphoretic. No pallor.  Solar damage on the arms  Psychiatric: He has a normal mood and affect.    Results for orders placed or performed in visit on 09/18/17  Culture, Group A Strep  Result Value Ref Range   MICRO NUMBER: [id number]    SPECIMEN QUALITY: ADEQUATE    SOURCE: OROPHARYNX    STATUS: FINAL    RESULT: No group A  Streptococcus isolated   POCT rapid strep A  Result Value Ref Range   Rapid Strep A Screen Negative Negative      Assessment & Plan:   Problem List Items Addressed This Visit    None    Visit Diagnoses    Cough    -  Primary   will treat with doxycycyline and lower dose prednisone; already had CXR; if not better in one week, refer to pulm   Wheezing       will use lower dose prednisone; will not use 40 mg or even 60 mg daily since he is elderly, discussed risk of GI bleed, but do want to help open things up       Follow up plan: No Follow-up on file.  An after-visit summary was printed and given to the patient at [place].  Please see the patient instructions which may contain other information and recommendations beyond what is mentioned above in the assessment and plan.  Meds ordered this encounter  Medications  . doxycycline (VIBRA-TABS) 100 MG tablet    Sig: Take 1 tablet (100 mg total) by mouth 2 (two) times daily.    Dispense:  14 tablet    Refill:  0  . predniSONE (DELTASONE) 20 MG tablet    Sig: Take 1 tablet (20 mg total) by mouth daily. (take with food)    Dispense:  10 tablet    Refill:  0    No orders of the defined types were placed in this encounter.

## 2017-10-21 DIAGNOSIS — D0461 Carcinoma in situ of skin of right upper limb, including shoulder: Secondary | ICD-10-CM | POA: Diagnosis not present

## 2017-10-21 DIAGNOSIS — Z08 Encounter for follow-up examination after completed treatment for malignant neoplasm: Secondary | ICD-10-CM | POA: Diagnosis not present

## 2017-10-21 DIAGNOSIS — X32XXXA Exposure to sunlight, initial encounter: Secondary | ICD-10-CM | POA: Diagnosis not present

## 2017-10-21 DIAGNOSIS — Z85820 Personal history of malignant melanoma of skin: Secondary | ICD-10-CM | POA: Diagnosis not present

## 2017-10-21 DIAGNOSIS — C44619 Basal cell carcinoma of skin of left upper limb, including shoulder: Secondary | ICD-10-CM | POA: Diagnosis not present

## 2017-10-21 DIAGNOSIS — D485 Neoplasm of uncertain behavior of skin: Secondary | ICD-10-CM | POA: Diagnosis not present

## 2017-10-21 DIAGNOSIS — D0362 Melanoma in situ of left upper limb, including shoulder: Secondary | ICD-10-CM | POA: Diagnosis not present

## 2017-10-21 DIAGNOSIS — D0462 Carcinoma in situ of skin of left upper limb, including shoulder: Secondary | ICD-10-CM | POA: Diagnosis not present

## 2017-10-21 DIAGNOSIS — Z85828 Personal history of other malignant neoplasm of skin: Secondary | ICD-10-CM | POA: Diagnosis not present

## 2017-10-21 DIAGNOSIS — L570 Actinic keratosis: Secondary | ICD-10-CM | POA: Diagnosis not present

## 2017-10-28 ENCOUNTER — Telehealth: Payer: Self-pay | Admitting: Family Medicine

## 2017-10-28 NOTE — Telephone Encounter (Unsigned)
Copied from [place]. Topic: Quick Communication - See Telephone Encounter >> Oct 28, 2017 10:03 AM Hewitt Shorts wrote: CRM for notification. See Telephone encounter for: 10/28/17.pt states he is still not over the sinus issues he has had but would like to know what needs to be done next  Best number [phone]

## 2017-11-21 ENCOUNTER — Ambulatory Visit (INDEPENDENT_AMBULATORY_CARE_PROVIDER_SITE_OTHER): Payer: PPO | Admitting: Family Medicine

## 2017-11-21 ENCOUNTER — Encounter: Payer: Self-pay | Admitting: Family Medicine

## 2017-11-21 VITALS — BP 124/76 | HR 70 | Temp 97.8°F | Resp 18 | Ht 72.0 in | Wt 198.2 lb

## 2017-11-21 DIAGNOSIS — R739 Hyperglycemia, unspecified: Secondary | ICD-10-CM | POA: Diagnosis not present

## 2017-11-21 DIAGNOSIS — R05 Cough: Secondary | ICD-10-CM | POA: Diagnosis not present

## 2017-11-21 DIAGNOSIS — E7800 Pure hypercholesterolemia, unspecified: Secondary | ICD-10-CM | POA: Diagnosis not present

## 2017-11-21 DIAGNOSIS — R059 Cough, unspecified: Secondary | ICD-10-CM

## 2017-11-21 DIAGNOSIS — I2510 Atherosclerotic heart disease of native coronary artery without angina pectoris: Secondary | ICD-10-CM

## 2017-11-21 DIAGNOSIS — G3281 Cerebellar ataxia in diseases classified elsewhere: Secondary | ICD-10-CM

## 2017-11-21 DIAGNOSIS — C439 Malignant melanoma of skin, unspecified: Secondary | ICD-10-CM

## 2017-11-21 DIAGNOSIS — Z23 Encounter for immunization: Secondary | ICD-10-CM

## 2017-11-21 DIAGNOSIS — I10 Essential (primary) hypertension: Secondary | ICD-10-CM | POA: Diagnosis not present

## 2017-11-21 DIAGNOSIS — K219 Gastro-esophageal reflux disease without esophagitis: Secondary | ICD-10-CM | POA: Diagnosis not present

## 2017-11-21 DIAGNOSIS — M159 Polyosteoarthritis, unspecified: Secondary | ICD-10-CM

## 2017-11-21 DIAGNOSIS — R3912 Poor urinary stream: Secondary | ICD-10-CM | POA: Diagnosis not present

## 2017-11-21 DIAGNOSIS — M150 Primary generalized (osteo)arthritis: Secondary | ICD-10-CM

## 2017-11-21 LAB — CBC WITH DIFFERENTIAL/PLATELET
BASOS PCT: 0.4 %
Basophils Absolute: 33 cells/uL (ref 0–200)
EOS ABS: 249 {cells}/uL (ref 15–500)
EOS PCT: 3 %
HEMATOCRIT: 43.8 % (ref 38.5–50.0)
HEMOGLOBIN: 14.8 g/dL (ref 13.2–17.1)
LYMPHS ABS: 2963 {cells}/uL (ref 850–3900)
MCH: 30.6 pg (ref 27.0–33.0)
MCHC: 33.8 g/dL (ref 32.0–36.0)
MCV: 90.7 fL (ref 80.0–100.0)
MONOS PCT: 9.7 %
MPV: 9.2 fL (ref 7.5–12.5)
NEUTROS ABS: 4250 {cells}/uL (ref 1500–7800)
Neutrophils Relative %: 51.2 %
Platelets: 305 10*3/uL (ref 140–400)
RBC: 4.83 10*6/uL (ref 4.20–5.80)
RDW: 12 % (ref 11.0–15.0)
Total Lymphocyte: 35.7 %
WBC mixed population: 805 cells/uL (ref 200–950)
WBC: 8.3 10*3/uL (ref 3.8–10.8)

## 2017-11-21 LAB — LIPID PANEL
CHOLESTEROL: 144 mg/dL (ref <200)
HDL: 35 mg/dL — AB (ref >40)
LDL Cholesterol (Calc): 87 mg/dL (calc)
Non-HDL Cholesterol (Calc): 109 mg/dL (calc) (ref <130)
Total CHOL/HDL Ratio: 4.1 (calc) (ref <5.0)
Triglycerides: 119 mg/dL (ref <150)

## 2017-11-21 LAB — COMPLETE METABOLIC PANEL WITH GFR
AG RATIO: 1.5 (calc) (ref 1.0–2.5)
ALBUMIN MSPROF: 4.1 g/dL (ref 3.6–5.1)
ALKALINE PHOSPHATASE (APISO): 72 U/L (ref 40–115)
ALT: 10 U/L (ref 9–46)
AST: 18 U/L (ref 10–35)
BILIRUBIN TOTAL: 0.7 mg/dL (ref 0.2–1.2)
BUN: 16 mg/dL (ref 7–25)
CO2: 30 mmol/L (ref 20–32)
CREATININE: 1 mg/dL (ref 0.70–1.11)
Calcium: 9 mg/dL (ref 8.6–10.3)
Chloride: 103 mmol/L (ref 98–110)
GFR, Est African American: 78 mL/min/{1.73_m2} (ref >=60)
GFR, Est Non African American: 67 mL/min/{1.73_m2} (ref >=60)
GLOBULIN: 2.7 g/dL (ref 1.9–3.7)
Glucose, Bld: 98 mg/dL (ref 65–139)
POTASSIUM: 4.4 mmol/L (ref 3.5–5.3)
SODIUM: 139 mmol/L (ref 135–146)
Total Protein: 6.8 g/dL (ref 6.1–8.1)

## 2017-11-21 MED ORDER — TAMSULOSIN HCL 0.4 MG PO CAPS: 0.4000 mg | ORAL_CAPSULE | Freq: Every day | ORAL | 5 refills | Status: DC

## 2017-11-21 MED ORDER — FLUTICASONE PROPIONATE 50 MCG/ACT NA SUSP: 2.0000 | NASAL | 0 refills | Status: DC | PRN

## 2017-11-21 MED ORDER — METOPROLOL TARTRATE 50 MG PO TABS: ORAL_TABLET | ORAL | 1 refills | Status: DC

## 2017-11-21 MED ORDER — DICLOFENAC SODIUM 1 % TD GEL: 2.0000 g | Freq: Four times a day (QID) | TRANSDERMAL | 1 refills | Status: DC

## 2017-11-21 MED ORDER — ATORVASTATIN CALCIUM 10 MG PO TABS: 10.0000 mg | ORAL_TABLET | Freq: Every day | ORAL | 1 refills | Status: DC

## 2017-11-21 MED ORDER — CLOPIDOGREL BISULFATE 75 MG PO TABS: ORAL_TABLET | ORAL | 1 refills | Status: DC

## 2017-11-21 MED ORDER — ZOSTER VAC RECOMB ADJUVANTED 50 MCG/0.5ML IM SUSR: 0.5000 mL | Freq: Once | INTRAMUSCULAR | 1 refills | Status: AC

## 2017-11-21 MED ORDER — FLUTICASONE FUROATE-VILANTEROL 100-25 MCG/INH IN AEPB: 1.0000 | INHALATION_SPRAY | Freq: Every day | RESPIRATORY_TRACT | 0 refills | Status: DC

## 2017-11-21 MED ORDER — PANTOPRAZOLE SODIUM 40 MG PO TBEC: 40.0000 mg | DELAYED_RELEASE_TABLET | Freq: Every day | ORAL | 1 refills | Status: DC

## 2017-11-21 NOTE — Progress Notes (Signed)
Name: Mark [name]   MRN: [id number]    DOB: 03/27/1929   Date:11/21/2017       Progress Note  Subjective  Chief Complaint  Chief Complaint  Patient presents with  . Follow-up    HPI  Cough: seen by Dr. Sanda Klein about 6 weeks ago, was given cough medication, and doxy. He is feeling much better, however still has a raspy voice when singing. We will try Breo and Flonase and monitor for now. He has occasional wheezing. Discussed getting a breathing test, but he wants to hold off until visit.   HTN: well controlled , occasionally gets dizzy when he stands up, but not severe, no falls. 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 Melrose Nakayama, no falls.   GERD: taking PPI daily, states only has problems with dietary indiscretion.   OA: right ankle, hands, lower back, taking Tylenol and also gabapentin for back with radiculitis down right leg, but stilll has pain, we will try topical medication, since he cannot take nsaid's by mouth  Malignant melanoma: seeing Dr. Evorn Gong,  Hyperglycemia: denies polyphagia, polyuria or polydipsia  CAD/s/p stent: sees Cardiologist, he still does his yard work, pulls weeds. He has some SOB with activity but no chest pain   I personally reviewed Family, Social and Surgical history with the patient/caregiver today.   Patient Active Problem List   Diagnosis Date Noted  . Cerebellar ataxia in diseases classified elsewhere ([place]) 11/21/2017  . Ischemic cardiomyopathy 06/11/2017  . Benign essential HTN 05/22/2017  . Bilateral carotid artery stenosis 05/22/2017  . Carotid stenosis 02/17/2017  . Neuropathy 01/28/2017  . Right leg paresthesias 11/26/2016  . Hypertension 08/27/2016  . GERD (gastroesophageal reflux disease) 08/27/2016  . Hyperglycemia 05/10/2016  . History of nonmelanoma skin cancer 10/10/2015  . Pain in  right shoulder 09/13/2015  . Dyspnea 08/05/2015  . Osteoarthritis 07/06/2015  . Leg swelling 05/30/2015  . Other specified soft tissue disorders 05/30/2015  . Hyperlipidemia 03/29/2015  . Family history of type 2 diabetes mellitus in father 03/29/2015  . Arthritis 02/03/2015  . DD (diverticular disease) 02/03/2015  . Diverticulosis of intestine without perforation or abscess without bleeding 02/03/2015  . DDD (degenerative disc disease), lumbar 03/03/2014  . Intervertebral disc disorder with radiculopathy of lumbar region 03/03/2014  . Lumbar radiculitis 03/03/2014  . Arteriosclerosis of coronary artery 04/18/2012  . S/P coronary artery stent placement 04/18/2012  . Malignant melanoma of skin ([place]) 04/18/2012  . Status post percutaneous transluminal coronary angioplasty 04/18/2012    Past Surgical History:  Procedure Laterality Date  . CORONARY ANGIOPLASTY WITH STENT PLACEMENT  2005   [facility]    Family History  Problem Relation Age of Onset  . Hypertension Mother   . Heart disease Mother   . Heart disease Father   . Hypertension Father     Social History   Socioeconomic History  . Marital status: Widowed    Spouse name: Not on file  . Number of children: Not on file  . Years of education: Not on file  . Highest education level: Not on file  Occupational History  . Not on file  Social Needs  . Financial resource strain: Not on file  . Food insecurity:    Worry: Not on file    Inability: Not on file  . Transportation needs:  Medical: Not on file    Non-medical: Not on file  Tobacco Use  . Smoking status: Former Smoker    Last attempt to quit: 05/16/1963    Years since quitting: 54.5  . Smokeless tobacco: Never Used  . Tobacco comment: quit over 50 years ago  Substance and Sexual Activity  . Alcohol use: No  . Drug use: No  . Sexual activity: Not Currently  Lifestyle  . Physical activity:    Days per week: Not on file    Minutes per session: Not on file  .  Stress: Not on file  Relationships  . Social connections:    Talks on phone: Not on file    Gets together: Not on file    Attends religious service: Not on file    Active member of club or organization: Not on file    Attends meetings of clubs or organizations: Not on file    Relationship status: Not on file  . Intimate partner violence:    Fear of current or ex partner: Not on file    Emotionally abused: Not on file    Physically abused: Not on file    Forced sexual activity: Not on file  Other Topics Concern  . Not on file  Social History Narrative  . Not on file     Current Outpatient Medications:  .  acetaminophen (TYLENOL) 500 MG tablet, Take 500 mg by mouth every 6 (six) hours as needed. , Disp: , Rfl:  .  aspirin EC 81 MG tablet, Take 81 mg by mouth daily. , Disp: , Rfl:  .  atorvastatin (LIPITOR) 10 MG tablet, Take 1 tablet (10 mg total) by mouth daily., Disp: 90 tablet, Rfl: 1 .  clopidogrel (PLAVIX) 75 MG tablet, 1 (one) Tablet, Oral, daily, Disp: 90 tablet, Rfl: 1 .  fluticasone (FLONASE) 50 MCG/ACT nasal spray, Place 2 sprays into both nostrils as needed., Disp: 16 g, Rfl: 0 .  gabapentin (NEURONTIN) 400 MG capsule, Take 400 mg by mouth 2 (two) times daily. , Disp: , Rfl:  .  Loratadine 10 MG CAPS, Take 10 mg by mouth daily. , Disp: , Rfl:  .  metoprolol tartrate (LOPRESSOR) 50 MG tablet, Take 1 tablet (50 mg total) by mouth 2 (two) times daily., Disp: 180 tablet, Rfl: 1 .  pantoprazole (PROTONIX) 40 MG tablet, Take 1 tablet (40 mg total) by mouth daily., Disp: 90 tablet, Rfl: 1 .  PROAIR HFA 108 (90 Base) MCG/ACT inhaler, Inhale 2 puffs into the lungs every 6 (six) hours as needed for wheezing or shortness of breath., Disp: 8.5 g, Rfl: 0 .  Probiotic Product (PROBIOTIC PO), Take by mouth daily. , Disp: , Rfl:  .  diclofenac sodium (VOLTAREN) 1 % GEL, Apply 2 g topically 4 (four) times daily., Disp: 100 g, Rfl: 1 .  fluticasone furoate-vilanterol (BREO ELLIPTA) 100-25  MCG/INH AEPB, Inhale 1 puff into the lungs daily., Disp: 60 each, Rfl: 0 .  tamsulosin (FLOMAX) 0.4 MG CAPS capsule, Take 1 capsule (0.4 mg total) by mouth daily., Disp: 30 capsule, Rfl: 5  Allergies  Allergen Reactions  . Rosuvastatin Rash and Other (See Comments)    No reaction per pt, He saw some information regarding Crestor and adverse effects No reaction per pt, He saw some information regarding Crestor and adverse effects No reaction per pt, He saw some information regarding Crestor and adverse effects No reaction per pt, He saw some information regarding Crestor and adverse effects No reaction  per pt, He saw some information regarding Crestor and adverse effects     ROS  Constitutional: Negative for fever or significant  weight change - lost 4 lbs since last visit .  Respiratory: Positive  for cough ( improving)  and chronic shortness of breath.   Cardiovascular: Negative for chest pain or palpitations.  Gastrointestinal: Negative for abdominal pain, no bowel changes.  Musculoskeletal: Negative for gait problem, positive for right ankle swelling ( negative doppler for DVT) Skin: Negative for rash.  Neurological: Negative for dizziness or headache.  No other specific complaints in a complete review of systems (except as listed in HPI above).  Objective  Vitals:   11/21/17 1004  BP: 124/76  Pulse: 70  Resp: 18  Temp: 97.8 F (36.6 C)  TempSrc: Oral  SpO2: 94%  Weight: 198 lb 3.2 oz (89.9 kg)  Height: 6' (1.829 m)    Body mass index is 26.88 kg/m.  Physical Exam  Constitutional: Patient appears well-developed and well-nourished. Obese  No distress.  HEENT: head atraumatic, normocephalic, pupils equal and reactive to light, neck supple, throat within normal limits Cardiovascular: Normal rate, regular rhythm and normal heart sounds.  No murmur heard. Trace  BLE edema. Pulmonary/Chest: Effort normal and breath sounds normal. No respiratory distress. Abdominal: Soft.   There is no tenderness. Psychiatric: Patient has a normal mood and affect. behavior is normal. Judgment and thought content normal. Neurological:   Recent Results (from the past 2160 hour(s))  Culture, Group A Strep     Status: None   Collection Time: 09/18/17 10:52 AM  Result Value Ref Range   MICRO NUMBER: [id number]    SPECIMEN QUALITY: ADEQUATE    SOURCE: OROPHARYNX    STATUS: FINAL    RESULT: No group A Streptococcus isolated   POCT rapid strep A     Status: Normal   Collection Time: 09/18/17  2:22 PM  Result Value Ref Range   Rapid Strep A Screen Negative Negative     PHQ2/9: Depression screen [facility] 2/9 11/21/2017 09/18/2017 06/24/2017 05/21/2017 04/15/2017  Decreased Interest 0 0 0 0 0  Down, Depressed, Hopeless 0 0 0 0 0  PHQ - 2 Score 0 0 0 0 0     Fall Risk: Fall Risk  11/21/2017 09/18/2017 06/24/2017 05/21/2017 04/15/2017  Falls in the past year? No No No No No     Functional Status Survey: Is the patient deaf or have difficulty hearing?: No Does the patient have difficulty seeing, even when wearing glasses/contacts?: No Does the patient have difficulty concentrating, remembering, or making decisions?: No Does the patient have difficulty walking or climbing stairs?: Yes Does the patient have difficulty dressing or bathing?: No Does the patient have difficulty doing errands alone such as visiting a doctor's office or shopping?: No  Assessment & Plan  1. Pure hypercholesterolemia  - atorvastatin (LIPITOR) 10 MG tablet; Take 1 tablet (10 mg total) by mouth daily.  Dispense: 90 tablet; Refill: 1 - Lipid panel  2. Arteriosclerosis of coronary artery  - clopidogrel (PLAVIX) 75 MG tablet; 1 (one) Tablet, Oral, daily  Dispense: 90 tablet; Refill: 1  3. Malignant melanoma of skin ([facility])  Seeing Dr. Evorn Gong  4. Essential hypertension  - metoprolol tartrate (LOPRESSOR) 50 MG tablet; Take 1 tablet (50 mg total) by mouth 2 (two) times daily.  Dispense: 180 tablet;  Refill: 1 - COMPLETE METABOLIC PANEL WITH GFR  5. Gastroesophageal reflux disease, esophagitis presence not specified  - pantoprazole (PROTONIX) 40 MG tablet;  Take 1 tablet (40 mg total) by mouth daily.  Dispense: 90 tablet; Refill: 1  6. Cerebellar ataxia in diseases classified elsewhere [facility])  Seen by Dr. Melrose Nakayama  7. Primary osteoarthritis involving multiple joints  - diclofenac sodium (VOLTAREN) 1 % GEL; Apply 2 g topically 4 (four) times daily.  Dispense: 100 g; Refill: 1  8. Cough  Improved, very mild symptoms now, we will try resuming flonase and try adding Breo to see if symptoms resolves - fluticasone (FLONASE) 50 MCG/ACT nasal spray; Place 2 sprays into both nostrils as needed.  Dispense: 16 g; Refill: 0 - fluticasone furoate-vilanterol (BREO ELLIPTA) 100-25 MCG/INH AEPB; Inhale 1 puff into the lungs daily.  Dispense: 60 each; Refill: 0 - CBC with Differential/Platelet  9. Hyperglycemia  - Hemoglobin A1c  10. Weak urinary stream  - tamsulosin (FLOMAX) 0.4 MG CAPS capsule; Take 1 capsule (0.4 mg total) by mouth daily.  Dispense: 30 capsule; Refill: 5  11. Need for vaccination for pneumococcus  - Pneumococcal polysaccharide vaccine 23-valent greater than or equal to 2yo subcutaneous/IM  12. Need for shingles vaccine  - Zoster Vaccine Adjuvanted [facility]) injection; Inject 0.5 mLs into the muscle once for 1 dose.  Dispense: 0.5 mL; Refill: 1

## 2017-11-22 LAB — HEMOGLOBIN A1C
EAG (MMOL/L): 7 (calc)
Hgb A1c MFr Bld: 6 % of total Hgb — ABNORMAL HIGH (ref <5.7)
MEAN PLASMA GLUCOSE: 126 (calc)

## 2017-12-02 DIAGNOSIS — D0362 Melanoma in situ of left upper limb, including shoulder: Secondary | ICD-10-CM | POA: Diagnosis not present

## 2017-12-02 DIAGNOSIS — L905 Scar conditions and fibrosis of skin: Secondary | ICD-10-CM | POA: Diagnosis not present

## 2017-12-11 DIAGNOSIS — E7800 Pure hypercholesterolemia, unspecified: Secondary | ICD-10-CM | POA: Diagnosis not present

## 2017-12-11 DIAGNOSIS — I6523 Occlusion and stenosis of bilateral carotid arteries: Secondary | ICD-10-CM | POA: Diagnosis not present

## 2017-12-11 DIAGNOSIS — I2510 Atherosclerotic heart disease of native coronary artery without angina pectoris: Secondary | ICD-10-CM | POA: Diagnosis not present

## 2017-12-11 DIAGNOSIS — R42 Dizziness and giddiness: Secondary | ICD-10-CM | POA: Diagnosis not present

## 2017-12-11 DIAGNOSIS — I10 Essential (primary) hypertension: Secondary | ICD-10-CM | POA: Diagnosis not present

## 2017-12-16 DIAGNOSIS — C44619 Basal cell carcinoma of skin of left upper limb, including shoulder: Secondary | ICD-10-CM | POA: Diagnosis not present

## 2017-12-16 DIAGNOSIS — D0462 Carcinoma in situ of skin of left upper limb, including shoulder: Secondary | ICD-10-CM | POA: Diagnosis not present

## 2017-12-16 DIAGNOSIS — D0461 Carcinoma in situ of skin of right upper limb, including shoulder: Secondary | ICD-10-CM | POA: Diagnosis not present

## 2018-01-16 DIAGNOSIS — I255 Ischemic cardiomyopathy: Secondary | ICD-10-CM | POA: Diagnosis not present

## 2018-01-16 DIAGNOSIS — I10 Essential (primary) hypertension: Secondary | ICD-10-CM | POA: Diagnosis not present

## 2018-02-01 ENCOUNTER — Ambulatory Visit
Admission: EM | Admit: 2018-02-01 | Discharge: 2018-02-01 | Disposition: A | Discharge status: Home / Self Care | Payer: PPO | Attending: Family Medicine | Admitting: Family Medicine

## 2018-02-01 ENCOUNTER — Ambulatory Visit (INDEPENDENT_AMBULATORY_CARE_PROVIDER_SITE_OTHER): Payer: PPO

## 2018-02-01 DIAGNOSIS — Z7982 Long term (current) use of aspirin: Secondary | ICD-10-CM | POA: Insufficient documentation

## 2018-02-01 DIAGNOSIS — M1990 Unspecified osteoarthritis, unspecified site: Secondary | ICD-10-CM | POA: Diagnosis not present

## 2018-02-01 DIAGNOSIS — I10 Essential (primary) hypertension: Secondary | ICD-10-CM | POA: Insufficient documentation

## 2018-02-01 DIAGNOSIS — I255 Ischemic cardiomyopathy: Secondary | ICD-10-CM | POA: Insufficient documentation

## 2018-02-01 DIAGNOSIS — R109 Unspecified abdominal pain: Secondary | ICD-10-CM | POA: Insufficient documentation

## 2018-02-01 DIAGNOSIS — Z79891 Long term (current) use of opiate analgesic: Secondary | ICD-10-CM | POA: Insufficient documentation

## 2018-02-01 DIAGNOSIS — Z833 Family history of diabetes mellitus: Secondary | ICD-10-CM | POA: Diagnosis not present

## 2018-02-01 DIAGNOSIS — Z8249 Family history of ischemic heart disease and other diseases of the circulatory system: Secondary | ICD-10-CM | POA: Insufficient documentation

## 2018-02-01 DIAGNOSIS — Z79899 Other long term (current) drug therapy: Secondary | ICD-10-CM | POA: Insufficient documentation

## 2018-02-01 DIAGNOSIS — S2242XA Multiple fractures of ribs, left side, initial encounter for closed fracture: Secondary | ICD-10-CM

## 2018-02-01 DIAGNOSIS — Y92410 Unspecified street and highway as the place of occurrence of the external cause: Secondary | ICD-10-CM | POA: Insufficient documentation

## 2018-02-01 DIAGNOSIS — R918 Other nonspecific abnormal finding of lung field: Secondary | ICD-10-CM

## 2018-02-01 DIAGNOSIS — Z85828 Personal history of other malignant neoplasm of skin: Secondary | ICD-10-CM | POA: Insufficient documentation

## 2018-02-01 DIAGNOSIS — E785 Hyperlipidemia, unspecified: Secondary | ICD-10-CM | POA: Insufficient documentation

## 2018-02-01 DIAGNOSIS — K219 Gastro-esophageal reflux disease without esophagitis: Secondary | ICD-10-CM | POA: Insufficient documentation

## 2018-02-01 DIAGNOSIS — I77811 Abdominal aortic ectasia: Secondary | ICD-10-CM

## 2018-02-01 DIAGNOSIS — R0781 Pleurodynia: Secondary | ICD-10-CM | POA: Diagnosis not present

## 2018-02-01 DIAGNOSIS — E875 Hyperkalemia: Secondary | ICD-10-CM | POA: Diagnosis not present

## 2018-02-01 DIAGNOSIS — I700 Atherosclerosis of aorta: Secondary | ICD-10-CM | POA: Insufficient documentation

## 2018-02-01 DIAGNOSIS — Z9889 Other specified postprocedural states: Secondary | ICD-10-CM | POA: Insufficient documentation

## 2018-02-01 DIAGNOSIS — Z87891 Personal history of nicotine dependence: Secondary | ICD-10-CM | POA: Diagnosis not present

## 2018-02-01 DIAGNOSIS — Z7902 Long term (current) use of antithrombotics/antiplatelets: Secondary | ICD-10-CM | POA: Insufficient documentation

## 2018-02-01 DIAGNOSIS — R739 Hyperglycemia, unspecified: Secondary | ICD-10-CM | POA: Insufficient documentation

## 2018-02-01 DIAGNOSIS — I252 Old myocardial infarction: Secondary | ICD-10-CM | POA: Insufficient documentation

## 2018-02-01 DIAGNOSIS — I2510 Atherosclerotic heart disease of native coronary artery without angina pectoris: Secondary | ICD-10-CM | POA: Diagnosis not present

## 2018-02-01 LAB — CREATININE, SERUM
CREATININE: 0.93 mg/dL (ref 0.61–1.24)
GFR calc non Af Amer: >60 mL/min (ref >60)

## 2018-02-01 MED ORDER — HYDROCODONE-ACETAMINOPHEN 5-325 MG PO TABS: ORAL_TABLET | ORAL | 0 refills | Status: DC

## 2018-02-01 MED ORDER — IOPAMIDOL (ISOVUE-300) INJECTION 61%
100.0000 mL | Freq: Once | INTRAVENOUS | Status: AC | PRN
  Administered 2018-02-01: 100 mL via INTRAVENOUS
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
## 2018-02-01 NOTE — Discharge Instructions (Signed)
Rest, ice, over the counter tylenol as needed Follow up with Primary Care provider for further evaluation/work up of incidental finding of pulmonary nodules and aorta
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
## 2018-02-04 ENCOUNTER — Encounter: Payer: Self-pay | Admitting: Nurse Practitioner

## 2018-02-04 ENCOUNTER — Ambulatory Visit (INDEPENDENT_AMBULATORY_CARE_PROVIDER_SITE_OTHER): Payer: PPO | Admitting: Nurse Practitioner

## 2018-02-04 VITALS — BP 110/60 | HR 67 | Temp 97.5°F | Resp 16 | Ht 72.0 in | Wt 202.3 lb

## 2018-02-04 DIAGNOSIS — K5730 Diverticulosis of large intestine without perforation or abscess without bleeding: Secondary | ICD-10-CM

## 2018-02-04 DIAGNOSIS — I10 Essential (primary) hypertension: Secondary | ICD-10-CM | POA: Diagnosis not present

## 2018-02-04 DIAGNOSIS — I25810 Atherosclerosis of coronary artery bypass graft(s) without angina pectoris: Secondary | ICD-10-CM

## 2018-02-04 DIAGNOSIS — I700 Atherosclerosis of aorta: Secondary | ICD-10-CM

## 2018-02-04 DIAGNOSIS — R918 Other nonspecific abnormal finding of lung field: Secondary | ICD-10-CM | POA: Diagnosis not present

## 2018-02-04 MED ORDER — METOPROLOL TARTRATE 25 MG PO TABS: 12.5000 mg | ORAL_TABLET | Freq: Every day | ORAL | 0 refills | Status: DC

## 2018-02-04 MED ORDER — METOPROLOL TARTRATE 25 MG PO TABS: ORAL_TABLET | ORAL | 0 refills | Status: DC

## 2018-02-04 NOTE — Patient Instructions (Signed)
DASH Eating Plan DASH stands for "Dietary Approaches to Stop Hypertension." The DASH eating plan is a healthy eating plan that has been shown to reduce high blood pressure (hypertension). It may also reduce your risk for type 2 diabetes, heart disease, and stroke. The DASH eating plan may also help with weight loss. What are tips for following this plan? General guidelines  Avoid eating more than 2,300 mg (milligrams) of salt (sodium) a day. If you have hypertension, you may need to reduce your sodium intake to 1,500 mg a day.  Limit alcohol intake to no more than 1 drink a day for nonpregnant women and 2 drinks a day for men. One drink equals 12 oz of beer, 5 oz of wine, or 1 oz of hard liquor.  Work with your health care provider to maintain a healthy body weight or to lose weight. Ask what an ideal weight is for you.  Get at least 30 minutes of exercise that causes your heart to beat faster (aerobic exercise) most days of the week. Activities may include walking, swimming, or biking.  Work with your health care provider or diet and nutrition specialist (dietitian) to adjust your eating plan to your individual calorie needs. Reading food labels  Check food labels for the amount of sodium per serving. Choose foods with less than 5 percent of the Daily Value of sodium. Generally, foods with less than 300 mg of sodium per serving fit into this eating plan.  To find whole grains, look for the word "whole" as the first word in the ingredient list. Shopping  Buy products labeled as "low-sodium" or "no salt added."  Buy fresh foods. Avoid canned foods and premade or frozen meals. Cooking  Avoid adding salt when cooking. Use salt-free seasonings or herbs instead of table salt or sea salt. Check with your health care provider or pharmacist before using salt substitutes.  Do not fry foods. Cook foods using healthy methods such as baking, boiling, grilling, and broiling instead.  Cook with  heart-healthy oils, such as olive, canola, soybean, or sunflower oil. Meal planning   Eat a balanced diet that includes: ? 5 or more servings of fruits and vegetables each day. At each meal, try to fill half of your plate with fruits and vegetables. ? Up to 6-8 servings of whole grains each day. ? Less than 6 oz of lean meat, poultry, or fish each day. A 3-oz serving of meat is about the same size as a deck of cards. One egg equals 1 oz. ? 2 servings of low-fat dairy each day. ? A serving of nuts, seeds, or beans 5 times each week. ? Heart-healthy fats. Healthy fats called Omega-3 fatty acids are found in foods such as flaxseeds and coldwater fish, like sardines, salmon, and mackerel.  Limit how much you eat of the following: ? Canned or prepackaged foods. ? Food that is high in trans fat, such as fried foods. ? Food that is high in saturated fat, such as fatty meat. ? Sweets, desserts, sugary drinks, and other foods with added sugar. ? Full-fat dairy products.  Do not salt foods before eating.  Try to eat at least 2 vegetarian meals each week.  Eat more home-cooked food and less restaurant, buffet, and fast food.  When eating at a restaurant, ask that your food be prepared with less salt or no salt, if possible. What foods are recommended? The items listed may not be a complete list. Talk with your dietitian about what   dietary choices are best for you. Grains Whole-grain or whole-wheat bread. Whole-grain or whole-wheat pasta. Brown rice. Oatmeal. Quinoa. Bulgur. Whole-grain and low-sodium cereals. Pita bread. Low-fat, low-sodium crackers. Whole-wheat flour tortillas. Vegetables Fresh or frozen vegetables (raw, steamed, roasted, or grilled). Low-sodium or reduced-sodium tomato and vegetable juice. Low-sodium or reduced-sodium tomato sauce and tomato paste. Low-sodium or reduced-sodium canned vegetables. Fruits All fresh, dried, or frozen fruit. Canned fruit in natural juice (without  added sugar). Meat and other protein foods Skinless chicken or turkey. Ground chicken or turkey. Pork with fat trimmed off. Fish and seafood. Egg whites. Dried beans, peas, or lentils. Unsalted nuts, nut butters, and seeds. Unsalted canned beans. Lean cuts of beef with fat trimmed off. Low-sodium, lean deli meat. Dairy Low-fat (1%) or fat-free (skim) milk. Fat-free, low-fat, or reduced-fat cheeses. Nonfat, low-sodium ricotta or cottage cheese. Low-fat or nonfat yogurt. Low-fat, low-sodium cheese. Fats and oils Soft margarine without trans fats. Vegetable oil. Low-fat, reduced-fat, or light mayonnaise and salad dressings (reduced-sodium). Canola, safflower, olive, soybean, and sunflower oils. Avocado. Seasoning and other foods Herbs. Spices. Seasoning mixes without salt. Unsalted popcorn and pretzels. Fat-free sweets. What foods are not recommended? The items listed may not be a complete list. Talk with your dietitian about what dietary choices are best for you. Grains Baked goods made with fat, such as croissants, muffins, or some breads. Dry pasta or rice meal packs. Vegetables Creamed or fried vegetables. Vegetables in a cheese sauce. Regular canned vegetables (not low-sodium or reduced-sodium). Regular canned tomato sauce and paste (not low-sodium or reduced-sodium). Regular tomato and vegetable juice (not low-sodium or reduced-sodium). Pickles. Olives. Fruits Canned fruit in a light or heavy syrup. Fried fruit. Fruit in cream or butter sauce. Meat and other protein foods Fatty cuts of meat. Ribs. Fried meat. Bacon. Sausage. Bologna and other processed lunch meats. Salami. Fatback. Hotdogs. Bratwurst. Salted nuts and seeds. Canned beans with added salt. Canned or smoked fish. Whole eggs or egg yolks. Chicken or turkey with skin. Dairy Whole or 2% milk, cream, and half-and-half. Whole or full-fat cream cheese. Whole-fat or sweetened yogurt. Full-fat cheese. Nondairy creamers. Whipped toppings.  Processed cheese and cheese spreads. Fats and oils Butter. Stick margarine. Lard. Shortening. Ghee. Bacon fat. Tropical oils, such as coconut, palm kernel, or palm oil. Seasoning and other foods Salted popcorn and pretzels. Onion salt, garlic salt, seasoned salt, table salt, and sea salt. Worcestershire sauce. Tartar sauce. Barbecue sauce. Teriyaki sauce. Soy sauce, including reduced-sodium. Steak sauce. Canned and packaged gravies. Fish sauce. Oyster sauce. Cocktail sauce. Horseradish that you find on the shelf. Ketchup. Mustard. Meat flavorings and tenderizers. Bouillon cubes. Hot sauce and Tabasco sauce. Premade or packaged marinades. Premade or packaged taco seasonings. Relishes. Regular salad dressings. Where to find more information:  National Heart, Lung, and Blood Institute: www.nhlbi.nih.gov  American Heart Association: www.heart.org Summary  The DASH eating plan is a healthy eating plan that has been shown to reduce high blood pressure (hypertension). It may also reduce your risk for type 2 diabetes, heart disease, and stroke.  With the DASH eating plan, you should limit salt (sodium) intake to 2,300 mg a day. If you have hypertension, you may need to reduce your sodium intake to 1,500 mg a day.  When on the DASH eating plan, aim to eat more fresh fruits and vegetables, whole grains, lean proteins, low-fat dairy, and heart-healthy fats.  Work with your health care provider or diet and nutrition specialist (dietitian) to adjust your eating plan to your individual   calorie needs. This information is not intended to replace advice given to you by your health care provider. Make sure you discuss any questions you have with your health care provider. Document Released: 07/05/2011 Document Revised: 07/09/2016 Document Reviewed: 07/09/2016 Elsevier Interactive Patient Education  2018 [employer]  
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
## 2018-02-13 DIAGNOSIS — R42 Dizziness and giddiness: Secondary | ICD-10-CM | POA: Diagnosis not present

## 2018-02-17 ENCOUNTER — Other Ambulatory Visit: Payer: Self-pay | Admitting: Family Medicine

## 2018-02-17 ENCOUNTER — Encounter (INDEPENDENT_AMBULATORY_CARE_PROVIDER_SITE_OTHER): Payer: Self-pay | Admitting: Vascular Surgery

## 2018-02-17 ENCOUNTER — Ambulatory Visit (INDEPENDENT_AMBULATORY_CARE_PROVIDER_SITE_OTHER): Payer: PPO | Admitting: Vascular Surgery

## 2018-02-17 VITALS — BP 112/64 | HR 70 | Resp 14 | Ht 72.0 in | Wt 201.0 lb

## 2018-02-17 DIAGNOSIS — M7989 Other specified soft tissue disorders: Secondary | ICD-10-CM

## 2018-02-17 DIAGNOSIS — E7800 Pure hypercholesterolemia, unspecified: Secondary | ICD-10-CM

## 2018-02-17 DIAGNOSIS — I10 Essential (primary) hypertension: Secondary | ICD-10-CM

## 2018-02-17 DIAGNOSIS — I2510 Atherosclerotic heart disease of native coronary artery without angina pectoris: Secondary | ICD-10-CM | POA: Diagnosis not present

## 2018-02-17 DIAGNOSIS — R059 Cough, unspecified: Secondary | ICD-10-CM

## 2018-02-17 DIAGNOSIS — I6523 Occlusion and stenosis of bilateral carotid arteries: Secondary | ICD-10-CM

## 2018-02-17 DIAGNOSIS — R05 Cough: Secondary | ICD-10-CM
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
## 2018-02-17 NOTE — Telephone Encounter (Signed)
Refill request was sent to Dr. Krichna Sowles for approval and submission.  

## 2018-02-26 ENCOUNTER — Encounter: Payer: Self-pay | Admitting: Family Medicine

## 2018-02-26 ENCOUNTER — Ambulatory Visit (INDEPENDENT_AMBULATORY_CARE_PROVIDER_SITE_OTHER): Payer: PPO | Admitting: Family Medicine

## 2018-02-26 VITALS — BP 108/56 | HR 76 | Temp 97.7°F | Resp 16 | Ht 72.0 in | Wt 193.6 lb

## 2018-02-26 DIAGNOSIS — R05 Cough: Secondary | ICD-10-CM

## 2018-02-26 DIAGNOSIS — K219 Gastro-esophageal reflux disease without esophagitis: Secondary | ICD-10-CM

## 2018-02-26 DIAGNOSIS — I10 Essential (primary) hypertension: Secondary | ICD-10-CM | POA: Diagnosis not present

## 2018-02-26 DIAGNOSIS — D692 Other nonthrombocytopenic purpura: Secondary | ICD-10-CM

## 2018-02-26 DIAGNOSIS — I2510 Atherosclerotic heart disease of native coronary artery without angina pectoris: Secondary | ICD-10-CM | POA: Diagnosis not present

## 2018-02-26 DIAGNOSIS — I700 Atherosclerosis of aorta: Secondary | ICD-10-CM

## 2018-02-26 DIAGNOSIS — N401 Enlarged prostate with lower urinary tract symptoms: Secondary | ICD-10-CM

## 2018-02-26 DIAGNOSIS — R059 Cough, unspecified: Secondary | ICD-10-CM

## 2018-02-26 DIAGNOSIS — G629 Polyneuropathy, unspecified: Secondary | ICD-10-CM

## 2018-02-26 DIAGNOSIS — N138 Other obstructive and reflux uropathy: Secondary | ICD-10-CM | POA: Diagnosis not present

## 2018-02-26 DIAGNOSIS — E7800 Pure hypercholesterolemia, unspecified: Secondary | ICD-10-CM

## 2018-02-26 DIAGNOSIS — R918 Other nonspecific abnormal finding of lung field: Secondary | ICD-10-CM

## 2018-02-26 MED ORDER — GABAPENTIN 400 MG PO CAPS: 400.0000 mg | ORAL_CAPSULE | Freq: Two times a day (BID) | ORAL | 1 refills | Status: DC

## 2018-02-26 MED ORDER — TAMSULOSIN HCL 0.4 MG PO CAPS: 0.4000 mg | ORAL_CAPSULE | Freq: Every day | ORAL | 1 refills | Status: DC

## 2018-02-26 MED ORDER — BENZONATATE 100 MG PO CAPS: 100.0000 mg | ORAL_CAPSULE | Freq: Three times a day (TID) | ORAL | 0 refills | Status: DC | PRN

## 2018-02-26 MED ORDER — CLOPIDOGREL BISULFATE 75 MG PO TABS: ORAL_TABLET | ORAL | 1 refills | Status: DC

## 2018-02-26 MED ORDER — ATORVASTATIN CALCIUM 10 MG PO TABS: 10.0000 mg | ORAL_TABLET | Freq: Every day | ORAL | 1 refills | Status: DC

## 2018-02-26 MED ORDER — PANTOPRAZOLE SODIUM 40 MG PO TBEC: 40.0000 mg | DELAYED_RELEASE_TABLET | Freq: Every day | ORAL | 1 refills | Status: DC

## 2018-02-26 NOTE — Progress Notes (Signed)
Name: Mark [name]   MRN: [id number]    DOB: 1929-04-07   Date:02/26/2018       Progress Note  Subjective  Chief Complaint  Chief Complaint  Patient presents with  . Follow-up    patient is here for a 4 month f/u  . Hypertension  . Gastroesophageal Reflux  . Osteoarthritis  . Hyperglycemia  . Coronary Artery Disease  . Weak stream  . Cough    dry cough persists and Breo makes his mouth break out    HPI  HTN/CAD: he is on lower dose of lopressor and being monitor, doing better now. He was getting dizzy and Dr. Nehemiah Massed decreased dose to half a pill and he is doing well since.   CAD: he denies chest pain or palpitation, no SOB  OA: he has pain on left hip that can cause antalgic gait, but still able to drive and take care of himself.   BPH with weak stream: he has been off finasteride but states flomax is working well for him.   Cough: he developed an URI back in Feb 2019 and since than has been coughing. We have tried Breo but he wants to stop because it causes ulcers in his mouth and is not really working. He states cough is no longer productive but still has coughing spells, denies wheezing or SOB, we have ordered a CT chest and advised him to keep appointment since a CT abdomen done at [facility] showed pulmonary nodules and he has this new cough  Atherosclerosis aorta: continue lipitor and plavix   Neuropathy: he has seen Dr. Manuella Ghazi and has been taking neurontin for neuropathy, he states it helps with burning and tingling of his legs.      Patient Active Problem List   Diagnosis Date Noted  . 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 Cerebellar ataxia in diseases classified elsewhere ([place]) 11/21/2017  . Ischemic cardiomyopathy 06/11/2017  . Benign essential HTN 05/22/2017  . Bilateral carotid artery stenosis 05/22/2017  . Carotid stenosis 02/17/2017  . Neuropathy 01/28/2017  . Right leg paresthesias 11/26/2016  . Hypertension 08/27/2016  . GERD  (gastroesophageal reflux disease) 08/27/2016  . Hyperglycemia 05/10/2016  . History of nonmelanoma skin cancer 10/10/2015  . Pain in right shoulder 09/13/2015  . Dyspnea 08/05/2015  . Osteoarthritis 07/06/2015  . Leg swelling 05/30/2015  . Other specified soft tissue disorders 05/30/2015  . Hyperlipidemia 03/29/2015  . Family history of type 2 diabetes mellitus in father 03/29/2015  . Arthritis 02/03/2015  . DD (diverticular disease) 02/03/2015  . Diverticulosis of intestine without perforation or abscess without bleeding 02/03/2015  . DDD (degenerative disc disease), lumbar 03/03/2014  . Intervertebral disc disorder with radiculopathy of lumbar region 03/03/2014  . Lumbar radiculitis 03/03/2014  . Arteriosclerosis of coronary artery 04/18/2012  . S/P coronary artery stent placement 04/18/2012  . Malignant melanoma of skin ([place]) 04/18/2012  . Status post percutaneous transluminal coronary angioplasty 04/18/2012    Past Surgical History:  Procedure Laterality Date  . CORONARY ANGIOPLASTY WITH STENT PLACEMENT  2005   [facility]    Family History  Problem Relation Age of Onset  . Hypertension Mother   . Heart disease Mother   . Heart disease Father   . Hypertension Father     Social History   Socioeconomic History  . Marital status: Widowed    Spouse name: Not on file  . Number of children: 3  . Years of education: Not on file  . Highest  education level: 10th grade  Occupational History  . Not on file  Social Needs  . Financial resource strain: Not hard at all  . Food insecurity:    Worry: Never true    Inability: Never true  . Transportation needs:    Medical: No    Non-medical: No  Tobacco Use  . Smoking status: Former Smoker    Last attempt to quit: 05/16/1963    Years since quitting: 54.8  . Smokeless tobacco: Never Used  . Tobacco comment: quit over 50 years ago  Substance and Sexual Activity  . Alcohol use: No  . Drug use: No  . Sexual activity: Not  Currently    Partners: Female  Lifestyle  . Physical activity:    Days per week: 1 day    Minutes per session: 40 min  . Stress: Not at all  Relationships  . Social connections:    Talks on phone: More than three times a week    Gets together: Twice a week    Attends religious service: More than 4 times per year    Active member of club or organization: No    Attends meetings of clubs or organizations: Never    Relationship status: Widowed  . Intimate partner violence:    Fear of current or ex partner: No    Emotionally abused: No    Physically abused: No    Forced sexual activity: No  Other Topics Concern  . Not on file  Social History Narrative  . Not on file     Current Outpatient Medications:  .  acetaminophen (TYLENOL) 500 MG tablet, Take 500 mg by mouth every 6 (six) hours as needed. , Disp: , Rfl:  .  aspirin EC 81 MG tablet, Take 81 mg by mouth daily. , Disp: , Rfl:  .  atorvastatin (LIPITOR) 10 MG tablet, Take 1 tablet (10 mg total) by mouth daily., Disp: 90 tablet, Rfl: 1 .  clopidogrel (PLAVIX) 75 MG tablet, 1 (one) Tablet, Oral, daily, Disp: 90 tablet, Rfl: 1 .  diclofenac sodium (VOLTAREN) 1 % GEL, Apply 2 g topically 4 (four) times daily., Disp: 100 g, Rfl: 1 .  fluticasone (FLONASE) 50 MCG/ACT nasal spray, Place 2 sprays into both nostrils as needed., Disp: 16 g, Rfl: 0 .  gabapentin (NEURONTIN) 400 MG capsule, Take 1 capsule (400 mg total) by mouth 2 (two) times daily., Disp: 180 capsule, Rfl: 1 .  Loratadine 10 MG CAPS, Take 10 mg by mouth daily. , Disp: , Rfl:  .  metoprolol tartrate (LOPRESSOR) 25 MG tablet, Take 0.5 tablets (12.5 mg total) by mouth daily., Disp: 1 tablet, Rfl: 0 .  pantoprazole (PROTONIX) 40 MG tablet, Take 1 tablet (40 mg total) by mouth daily., Disp: 90 tablet, Rfl: 1 .  Probiotic Product (PROBIOTIC PO), Take by mouth daily. , Disp: , Rfl:  .  tamsulosin (FLOMAX) 0.4 MG CAPS capsule, Take 1 capsule (0.4 mg total) by mouth daily., Disp: 90  capsule, Rfl: 1 .  benzonatate (TESSALON) 100 MG capsule, Take 1-2 capsules (100-200 mg total) by mouth 3 (three) times daily as needed., Disp: 40 capsule, Rfl: 0  Allergies  Allergen Reactions  . Breo Ellipta [Fluticasone Furoate-Vilanterol] Rash    Patient stated that it made his mouth break out  . Rosuvastatin Rash and Other (See Comments)    No reaction per pt, He saw some information regarding Crestor and adverse effects No reaction per pt, He saw some information regarding Crestor and  adverse effects No reaction per pt, He saw some information regarding Crestor and adverse effects No reaction per pt, He saw some information regarding Crestor and adverse effects No reaction per pt, He saw some information regarding Crestor and adverse effects     ROS  Constitutional: Negative for fever, positive for mild  weight change.  Respiratory: Negative for cough and shortness of breath.   Cardiovascular: Negative for chest pain or palpitations.  Gastrointestinal: Negative for abdominal pain, no bowel changes.  Musculoskeletal: positive  for gait problem ( right hip pain) but no  joint swelling.  Skin: Negative for rash, but he has easy bruising .  Neurological: Negative for dizziness or headache.  No other specific complaints in a complete review of systems (except as listed in HPI above).  Objective  Vitals:   02/26/18 1047 02/26/18 1103  BP: (!) 88/42 (!) 108/56  Pulse: 76   Resp: 16   Temp: 97.7 F (36.5 C)   TempSrc: Oral   SpO2: 97%   Weight: 193 lb 9.6 oz (87.8 kg)   Height: 6' (1.829 m)     Body mass index is 26.26 kg/m.  Physical Exam  Constitutional: Patient appears well-developed and well-nourished.  No distress.  HEENT: head atraumatic, normocephalic, pupils equal and reactive to light, neck supple, throat within normal limits, small mucosa irritation on upper lip  Cardiovascular: Normal rate, regular rhythm and normal heart sounds.  No murmur heard. Trace  BLE  edema. Pulmonary/Chest: Effort normal and breath sounds normal. No respiratory distress. Abdominal: Soft.  There is no tenderness. Skin: senile purpura  Psychiatric: Patient has a normal mood and affect. behavior is normal. Judgment and thought content normal.  Recent Results (from the past 2160 hour(s))  Creatinine, serum     Status: None   Collection Time: 02/01/18 10:19 AM  Result Value Ref Range   Creatinine, Ser 0.93 0.61 - 1.24 mg/dL   GFR calc non Af Amer >60 >60 mL/min   GFR calc Af Amer >60 >60 mL/min    Comment: (NOTE) The eGFR has been calculated using the CKD EPI equation. This calculation has not been validated in all clinical situations. eGFR's persistently <60 mL/min signify possible Chronic Kidney Disease. Performed at [facility], [address], [place], [place] [address]       PHQ2/9: Depression screen [facility] 2/9 02/26/2018 11/21/2017 09/18/2017 06/24/2017 05/21/2017  Decreased Interest 0 0 0 0 0  Down, Depressed, Hopeless 0 0 0 0 0  PHQ - 2 Score 0 0 0 0 0  Altered sleeping 0 - - - -  Tired, decreased energy 0 - - - -  Change in appetite 0 - - - -  Feeling bad or failure about yourself  0 - - - -  Trouble concentrating 0 - - - -  Moving slowly or fidgety/restless 0 - - - -  Suicidal thoughts 0 - - - -  PHQ-9 Score 0 - - - -  Difficult doing work/chores Not difficult at all - - - -     Fall Risk: Fall Risk  02/26/2018 02/04/2018 11/21/2017 09/18/2017 06/24/2017  Falls in the past year? _0      Functional Status Survey: Is the patient deaf or have difficulty hearing?: No Does the patient have difficulty seeing, even when wearing glasses/contacts?: No Does the patient have difficulty concentrating, remembering, or making decisions?: No Does the patient have difficulty walking or climbing stairs?: No Does the patient have difficulty  dressing or bathing?: No Does the patient have difficulty doing errands alone such as visiting a  doctor's office or shopping?: No   Assessment & Plan  1. Pure hypercholesterolemia  - atorvastatin (LIPITOR) 10 MG tablet; Take 1 tablet (10 mg total) by mouth daily.  Dispense: 90 tablet; Refill: 1  2. Arteriosclerosis of coronary artery  - clopidogrel (PLAVIX) 75 MG tablet; 1 (one) Tablet, Oral, daily  Dispense: 90 tablet; Refill: 1  3. Essential hypertension  Doing better, no longer having dizziness   4. Gastroesophageal reflux disease, esophagitis presence not specified  - pantoprazole (PROTONIX) 40 MG tablet; Take 1 tablet (40 mg total) by mouth daily.  Dispense: 90 tablet; Refill: 1  5. BPH with obstruction/lower urinary tract symptoms  - tamsulosin (FLOMAX) 0.4 MG CAPS capsule; Take 1 capsule (0.4 mg total) by mouth daily.  Dispense: 90 capsule; Refill: 1  6. Aortic atherosclerosis ([place])  On statin therapy   7. Cough  We need CT chest to be done, already ordered, pulmonary nodule  - benzonatate (TESSALON) 100 MG capsule; Take 1-2 capsules (100-200 mg total) by mouth 3 (three) times daily as needed.  Dispense: 40 capsule; Refill: 0  8. Peripheral polyneuropathy  - gabapentin (NEURONTIN) 400 MG capsule; Take 1 capsule (400 mg total) by mouth 2 (two) times daily.  Dispense: 180 capsule; Refill: 1  9. Pulmonary nodules  He will have CT chest, incidental finding on CT abdomen    10. Senile purpura ([place])  Reassurance

## 2018-02-28 ENCOUNTER — Other Ambulatory Visit: Payer: Self-pay | Admitting: Family Medicine

## 2018-02-28 DIAGNOSIS — M159 Polyosteoarthritis, unspecified: Secondary | ICD-10-CM

## 2018-02-28 DIAGNOSIS — M150 Primary generalized (osteo)arthritis: Principal | ICD-10-CM

## 2018-03-05 ENCOUNTER — Ambulatory Visit
Admission: RE | Admit: 2018-03-05 | Discharge: 2018-03-05 | Disposition: A | Discharge status: Home / Self Care | Payer: PPO | Source: Ambulatory Visit | Attending: Nurse Practitioner | Admitting: Nurse Practitioner

## 2018-03-05 DIAGNOSIS — I700 Atherosclerosis of aorta: Secondary | ICD-10-CM | POA: Diagnosis not present

## 2018-03-05 DIAGNOSIS — R918 Other nonspecific abnormal finding of lung field: Secondary | ICD-10-CM | POA: Diagnosis not present

## 2018-03-05 DIAGNOSIS — I712 Thoracic aortic aneurysm, without rupture: Secondary | ICD-10-CM | POA: Diagnosis not present

## 2018-03-19 ENCOUNTER — Other Ambulatory Visit: Payer: Self-pay | Admitting: Family Medicine

## 2018-03-19 DIAGNOSIS — R059 Cough, unspecified: Secondary | ICD-10-CM

## 2018-03-19 DIAGNOSIS — R05 Cough: Secondary | ICD-10-CM

## 2018-03-19 NOTE — Telephone Encounter (Signed)
Please check on him, he should not be coughing anymore or should be feeling much better. Otherwise needs follow up

## 2018-03-20 NOTE — Telephone Encounter (Signed)
Not appropriate. I am sorry

## 2018-03-20 NOTE — Telephone Encounter (Signed)
Patient stated that he is not coughing anymore but will like to have it in case it comes back.

## 2018-03-24 ENCOUNTER — Encounter: Payer: Self-pay | Admitting: Family Medicine

## 2018-03-24 ENCOUNTER — Ambulatory Visit (INDEPENDENT_AMBULATORY_CARE_PROVIDER_SITE_OTHER): Payer: PPO | Admitting: Family Medicine

## 2018-03-24 VITALS — BP 110/72 | HR 75 | Temp 97.8°F | Resp 16 | Ht 72.0 in | Wt 199.6 lb

## 2018-03-24 DIAGNOSIS — R05 Cough: Secondary | ICD-10-CM | POA: Diagnosis not present

## 2018-03-24 DIAGNOSIS — R059 Cough, unspecified: Secondary | ICD-10-CM

## 2018-03-24 DIAGNOSIS — R918 Other nonspecific abnormal finding of lung field: Secondary | ICD-10-CM

## 2018-03-24 DIAGNOSIS — J8410 Pulmonary fibrosis, unspecified: Secondary | ICD-10-CM

## 2018-03-24 MED ORDER — BENZONATATE 100 MG PO CAPS: 100.0000 mg | ORAL_CAPSULE | Freq: Three times a day (TID) | ORAL | 0 refills | Status: DC | PRN

## 2018-03-24 NOTE — Progress Notes (Signed)
Name: MAXIMO SPRATLING   MRN: [id number]    DOB: 24-Nov-1928   Date:03/24/2018       Progress Note  Subjective  Chief Complaint  Chief Complaint  Patient presents with  . Follow-up    cough dot better with medication, now it is back    HPI  Pt presents to follow up on cough.  He is out of his tessalon - was taking 2-3 times a day to help with his symptoms. He stopped taking Breo because it gave him ulcers of the mouth.  Taking claritin daily; using flonase PRN.  Former smoker - age 40yo-84yo - smoked about 1/2ppd.  He did have CT Chest performed on 03/05/2018 which found mild lung fibrosis, stable lung nodules, and aortic aneurysm. Recommendation states to repeat Chest CT in 3-6 months and monitor aneurysm annually.  He is aware of the abnormalities of this CT scan and would like to follow through with referral to pulmonology which we will place today.  Patient Active Problem List   Diagnosis Date Noted  . 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 Cerebellar ataxia in diseases classified elsewhere ([place]) 11/21/2017  . Ischemic cardiomyopathy 06/11/2017  . Benign essential HTN 05/22/2017  . Bilateral carotid artery stenosis 05/22/2017  . Carotid stenosis 02/17/2017  . Neuropathy 01/28/2017  . Right leg paresthesias 11/26/2016  . Hypertension 08/27/2016  . GERD (gastroesophageal reflux disease) 08/27/2016  . Hyperglycemia 05/10/2016  . History of nonmelanoma skin cancer 10/10/2015  . Pain in right shoulder 09/13/2015  . Dyspnea 08/05/2015  . Osteoarthritis 07/06/2015  . Leg swelling 05/30/2015  . Other specified soft tissue disorders 05/30/2015  . Hyperlipidemia 03/29/2015  . Family history of type 2 diabetes mellitus in father 03/29/2015  . Arthritis 02/03/2015  . DD (diverticular disease) 02/03/2015  . Diverticulosis of intestine without perforation or abscess without bleeding 02/03/2015  . DDD (degenerative disc disease), lumbar 03/03/2014  .  Intervertebral disc disorder with radiculopathy of lumbar region 03/03/2014  . Lumbar radiculitis 03/03/2014  . Arteriosclerosis of coronary artery 04/18/2012  . S/P coronary artery stent placement 04/18/2012  . Malignant melanoma of skin ([place]) 04/18/2012  . Status post percutaneous transluminal coronary angioplasty 04/18/2012    Social History   Tobacco Use  . Smoking status: Former Smoker    Last attempt to quit: 05/16/1963    Years since quitting: 54.8  . Smokeless tobacco: Never Used  . Tobacco comment: quit over 50 years ago  Substance Use Topics  . Alcohol use: No     Current Outpatient Medications:  .  acetaminophen (TYLENOL) 500 MG tablet, Take 500 mg by mouth every 6 (six) hours as needed. , Disp: , Rfl:  .  aspirin EC 81 MG tablet, Take 81 mg by mouth daily. , Disp: , Rfl:  .  atorvastatin (LIPITOR) 10 MG tablet, Take 1 tablet (10 mg total) by mouth daily., Disp: 90 tablet, Rfl: 1 .  clopidogrel (PLAVIX) 75 MG tablet, 1 (one) Tablet, Oral, daily, Disp: 90 tablet, Rfl: 1 .  diclofenac sodium (VOLTAREN) 1 % GEL, Apply 2 g topically 4 (four) times daily., Disp: 100 g, Rfl: 0 .  fluticasone (FLONASE) 50 MCG/ACT nasal spray, Place 2 sprays into both nostrils as needed., Disp: 16 g, Rfl: 0 .  gabapentin (NEURONTIN) 400 MG capsule, Take 1 capsule (400 mg total) by mouth 2 (two) times daily., Disp: 180 capsule, Rfl: 1 .  Loratadine 10 MG CAPS, Take 10 mg by mouth daily. , Disp: ,  Rfl:  .  metoprolol tartrate (LOPRESSOR) 25 MG tablet, Take 0.5 tablets (12.5 mg total) by mouth daily., Disp: 1 tablet, Rfl: 0 .  pantoprazole (PROTONIX) 40 MG tablet, Take 1 tablet (40 mg total) by mouth daily., Disp: 90 tablet, Rfl: 1 .  Probiotic Product (PROBIOTIC PO), Take by mouth daily. , Disp: , Rfl:  .  tamsulosin (FLOMAX) 0.4 MG CAPS capsule, Take 1 capsule (0.4 mg total) by mouth daily., Disp: 90 capsule, Rfl: 1 .  benzonatate (TESSALON) 100 MG capsule, Take 1-2 capsules (100-200 mg total) by  mouth 3 (three) times daily as needed. (Patient not taking: Reported on 03/24/2018), Disp: 40 capsule, Rfl: 0  Allergies  Allergen Reactions  . Breo Ellipta [Fluticasone Furoate-Vilanterol] Rash    Patient stated that it made his mouth break out  . Rosuvastatin Rash and Other (See Comments)    No reaction per pt, He saw some information regarding Crestor and adverse effects No reaction per pt, He saw some information regarding Crestor and adverse effects No reaction per pt, He saw some information regarding Crestor and adverse effects No reaction per pt, He saw some information regarding Crestor and adverse effects No reaction per pt, He saw some information regarding Crestor and adverse effects    ROS  Constitutional: Negative for fever or weight change.  Respiratory: Positive for cough and shortness of breath on exertion.   Cardiovascular: Negative for chest pain or palpitations.  Gastrointestinal: Negative for abdominal pain, no bowel changes.  Musculoskeletal: Negative for gait problem or joint swelling.  Skin: Negative for rash.  Neurological: Negative for dizziness or headache.  No other specific complaints in a complete review of systems (except as listed in HPI above).  Objective  Vitals:   03/24/18 1118  BP: 110/72  Pulse: 75  Resp: 16  Temp: 97.8 F (36.6 C)  TempSrc: Oral  SpO2: 93%  Weight: 199 lb 9.6 oz (90.5 kg)  Height: 6' (1.829 m)   Body mass index is 27.07 kg/m.  Nursing Note and Vital Signs reviewed.  Physical Exam  Constitutional: Patient appears well-developed and well-nourished. No distress.  HENT: Head: Normocephalic and atraumatic.  Neck: Normal range of motion. Neck supple. No JVD present. No thyromegaly present.  Cardiovascular: Normal rate, regular rhythm and normal heart sounds.  No murmur heard. No BLE edema. Pulmonary/Chest: Effort normal and breath sounds normal - no wheezes, rhonchi, or crackles. No respiratory distress. Abdominal:  Soft. Bowel sounds are normal, no distension. There is no tenderness. no masses Musculoskeletal: Normal range of motion, no joint effusions. No gross deformities Neurological: he is alert and oriented to person, place, and time. No cranial nerve deficit. Coordination, balance, strength, speech and gait are normal.  Skin: Skin is warm and dry. No rash noted. No erythema.  Psychiatric: Patient has a normal mood and affect. behavior is normal. Judgment and thought content normal.  No results found for this or any previous visit (from the past 72 hour(s)).  Assessment & Plan  1. Cough - benzonatate (TESSALON) 100 MG capsule; Take 1-2 capsules (100-200 mg total) by mouth 3 (three) times daily as needed.  Dispense: 40 capsule; Refill: 0 - Ambulatory referral to Pulmonology  2. Lung nodules - Ambulatory referral to Pulmonology  3. 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 Follow up and care  instructions discussed and provided in AVS.

## 2018-03-25 ENCOUNTER — Encounter: Payer: Self-pay | Admitting: Internal Medicine

## 2018-03-25 ENCOUNTER — Ambulatory Visit: Payer: PPO | Admitting: Internal Medicine

## 2018-03-25 VITALS — BP 130/80 | HR 77 | Ht 72.0 in | Wt 200.0 lb

## 2018-03-25 DIAGNOSIS — R918 Other nonspecific abnormal finding of lung field: Secondary | ICD-10-CM | POA: Diagnosis not present

## 2018-03-25 DIAGNOSIS — J849 Interstitial pulmonary disease, unspecified: Secondary | ICD-10-CM
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
## 2018-03-25 NOTE — Patient Instructions (Signed)
Follow up as needed

## 2018-04-02 DIAGNOSIS — Z1283 Encounter for screening for malignant neoplasm of skin: Secondary | ICD-10-CM | POA: Diagnosis not present

## 2018-04-02 DIAGNOSIS — L905 Scar conditions and fibrosis of skin: Secondary | ICD-10-CM | POA: Diagnosis not present

## 2018-04-02 DIAGNOSIS — L812 Freckles: Secondary | ICD-10-CM | POA: Diagnosis not present

## 2018-04-02 DIAGNOSIS — Z85828 Personal history of other malignant neoplasm of skin: Secondary | ICD-10-CM | POA: Diagnosis not present

## 2018-04-02 DIAGNOSIS — L570 Actinic keratosis: Secondary | ICD-10-CM | POA: Diagnosis not present

## 2018-04-02 DIAGNOSIS — I8310 Varicose veins of unspecified lower extremity with inflammation: Secondary | ICD-10-CM | POA: Diagnosis not present

## 2018-04-02 DIAGNOSIS — L821 Other seborrheic keratosis: Secondary | ICD-10-CM | POA: Diagnosis not present

## 2018-04-02 DIAGNOSIS — D225 Melanocytic nevi of trunk: Secondary | ICD-10-CM | POA: Diagnosis not present

## 2018-04-02 DIAGNOSIS — D485 Neoplasm of uncertain behavior of skin: Secondary | ICD-10-CM | POA: Diagnosis not present

## 2018-04-02 DIAGNOSIS — L578 Other skin changes due to chronic exposure to nonionizing radiation: Secondary | ICD-10-CM | POA: Diagnosis not present

## 2018-04-15 ENCOUNTER — Other Ambulatory Visit: Payer: Self-pay | Admitting: Family Medicine

## 2018-04-15 DIAGNOSIS — M159 Polyosteoarthritis, unspecified: Secondary | ICD-10-CM

## 2018-04-15 DIAGNOSIS — M150 Primary generalized (osteo)arthritis: Principal | ICD-10-CM

## 2018-04-15 NOTE — Telephone Encounter (Signed)
Refill request for general medication. Voltaren Gel To [facility].  Last office visit 02/26/2018   Follow up on 05/28/2018

## 2018-05-21 DIAGNOSIS — L570 Actinic keratosis: Secondary | ICD-10-CM | POA: Diagnosis not present

## 2018-05-21 DIAGNOSIS — L578 Other skin changes due to chronic exposure to nonionizing radiation: Secondary | ICD-10-CM | POA: Diagnosis not present

## 2018-05-28 ENCOUNTER — Encounter: Payer: Self-pay | Admitting: Family Medicine

## 2018-05-28 ENCOUNTER — Ambulatory Visit (INDEPENDENT_AMBULATORY_CARE_PROVIDER_SITE_OTHER): Payer: PPO | Admitting: Family Medicine

## 2018-05-28 VITALS — BP 124/66 | HR 70 | Temp 97.4°F | Resp 14 | Ht 72.0 in | Wt 199.2 lb

## 2018-05-28 DIAGNOSIS — I10 Essential (primary) hypertension: Secondary | ICD-10-CM

## 2018-05-28 DIAGNOSIS — I2510 Atherosclerotic heart disease of native coronary artery without angina pectoris: Secondary | ICD-10-CM | POA: Diagnosis not present

## 2018-05-28 DIAGNOSIS — K219 Gastro-esophageal reflux disease without esophagitis: Secondary | ICD-10-CM

## 2018-05-28 DIAGNOSIS — I5022 Chronic systolic (congestive) heart failure: Secondary | ICD-10-CM | POA: Insufficient documentation

## 2018-05-28 DIAGNOSIS — N138 Other obstructive and reflux uropathy: Secondary | ICD-10-CM

## 2018-05-28 DIAGNOSIS — I712 Thoracic aortic aneurysm, without rupture, unspecified: Secondary | ICD-10-CM

## 2018-05-28 DIAGNOSIS — I700 Atherosclerosis of aorta: Secondary | ICD-10-CM

## 2018-05-28 DIAGNOSIS — I5023 Acute on chronic systolic (congestive) heart failure: Secondary | ICD-10-CM | POA: Insufficient documentation

## 2018-05-28 DIAGNOSIS — N401 Enlarged prostate with lower urinary tract symptoms: Secondary | ICD-10-CM

## 2018-05-28 DIAGNOSIS — J8410 Pulmonary fibrosis, unspecified: Secondary | ICD-10-CM | POA: Diagnosis not present

## 2018-05-28 DIAGNOSIS — Z23 Encounter for immunization: Secondary | ICD-10-CM | POA: Diagnosis not present

## 2018-05-28 MED ORDER — PANTOPRAZOLE SODIUM 40 MG PO TBEC: 40.0000 mg | DELAYED_RELEASE_TABLET | Freq: Every day | ORAL | 1 refills | Status: DC

## 2018-05-28 NOTE — Patient Instructions (Signed)
Today your bp was towards low end of normal and since you are feeling dizzy when you stand up, discuss with Dr. Nehemiah Massed if it would be best to change from metoprolol to carvedilol 3.215 mg BID  Stay hydrated and get up slowly to avoid falls.

## 2018-05-28 NOTE — Progress Notes (Signed)
Name: Mark [name]   MRN: [id number]    DOB: November 09, 1928   Date:05/28/2018       Progress Note  Subjective  Chief Complaint  Chief Complaint  Patient presents with  . Follow-up    3 mth f/u  . Immunizations    high dose flu  . hypercholesterolemia  . Hypertension  . Gastroesophageal Reflux  . Benign Prostatic Hypertrophy  . Atherosclerosis aorta  . Neuropathy  . Osteoarthritis  . Coronary Artery Disease    HPI    CHF: he is under the care of Dr. Nehemiah Massed. He was sent for evaluation of dizziness. He described dizziness as orthostatic, and he states now he is getting used to getting up slowly. The dose of lopressor was decreased and symptoms improved. He had and echo that showed EF 40 % and mild LVH and mild LV systolic dysfunction. He has some sob with activity -even when showering or walking from his bedroom to his kitchen. He states he does not want further evaluation. He is moving to [employer] living and is happy with his life. Symptoms are not bothering him that much.   INTERPRETATION MILD LV SYSTOLIC DYSFUNCTION (See above) WITH MILD LVH NORMAL RIGHT VENTRICULAR SYSTOLIC FUNCTION MODERATE VALVULAR REGURGITATION (See above) NO VALVULAR STENOSIS MODERATE AR MILD MR, TR EF 40%   GERD: taking medication and symptoms are controlled at this time, no heartburn or regurgitation  BPH: taking medication and denies side effects, urinary frequency is working   Lung nodules: seen by Dr. Mortimer Fries. He states he does not want repeat studies. He had a full life and is enjoying life as it is   Atherosclerosis aorta: discussed results with patient, he is not interested in changing management , also discussed aneurysm of aorta but he does not want any intervention or referral to vascular surgeon at this time. He is 35 and agree with him at this point    Patient Active Problem List   Diagnosis Date Noted  . 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  Cerebellar ataxia in diseases classified elsewhere ([place]) 11/21/2017  . Ischemic cardiomyopathy 06/11/2017  . Benign essential HTN 05/22/2017  . Bilateral carotid artery stenosis 05/22/2017  . Carotid stenosis 02/17/2017  . Neuropathy 01/28/2017  . Right leg paresthesias 11/26/2016  . Hypertension 08/27/2016  . GERD (gastroesophageal reflux disease) 08/27/2016  . Hyperglycemia 05/10/2016  . History of nonmelanoma skin cancer 10/10/2015  . Pain in right shoulder 09/13/2015  . Dyspnea 08/05/2015  . Osteoarthritis 07/06/2015  . Leg swelling 05/30/2015  . Other specified soft tissue disorders 05/30/2015  . Hyperlipidemia 03/29/2015  . Family history of type 2 diabetes mellitus in father 03/29/2015  . Arthritis 02/03/2015  . DD (diverticular disease) 02/03/2015  . Diverticulosis of intestine without perforation or abscess without bleeding 02/03/2015  . DDD (degenerative disc disease), lumbar 03/03/2014  . Intervertebral disc disorder with radiculopathy of lumbar region 03/03/2014  . Lumbar radiculitis 03/03/2014  . Arteriosclerosis of coronary artery 04/18/2012  . S/P coronary artery stent placement 04/18/2012  . Malignant melanoma of skin ([place]) 04/18/2012  . Status post percutaneous transluminal coronary angioplasty 04/18/2012    Past Surgical History:  Procedure Laterality Date  . CORONARY ANGIOPLASTY WITH STENT PLACEMENT  2005   [facility]    Family History  Problem Relation Age of Onset  . Hypertension Mother   . Heart disease Mother   . Heart disease Father   . Hypertension Father     Social History  Socioeconomic History  . Marital status: Widowed    Spouse name: Not on file  . Number of children: 3  . Years of education: Not on file  . Highest education level: 10th grade  Occupational History  . Not on file  Social Needs  . Financial resource strain: Not hard at all  . Food insecurity:    Worry: Never true    Inability: Never true  . Transportation needs:     Medical: No    Non-medical: No  Tobacco Use  . Smoking status: Former Smoker    Last attempt to quit: 05/16/1963    Years since quitting: 55.0  . Smokeless tobacco: Never Used  . Tobacco comment: quit over 50 years ago  Substance and Sexual Activity  . Alcohol use: No  . Drug use: No  . Sexual activity: Not Currently    Partners: Female  Lifestyle  . Physical activity:    Days per week: 1 day    Minutes per session: 40 min  . Stress: Not at all  Relationships  . Social connections:    Talks on phone: More than three times a week    Gets together: Twice a week    Attends religious service: More than 4 times per year    Active member of club or organization: No    Attends meetings of clubs or organizations: Never    Relationship status: Widowed  . Intimate partner violence:    Fear of current or ex partner: No    Emotionally abused: No    Physically abused: No    Forced sexual activity: No  Other Topics Concern  . Not on file  Social History Narrative  . Not on file     Current Outpatient Medications:  .  acetaminophen (TYLENOL) 500 MG tablet, Take 500 mg by mouth every 6 (six) hours as needed. , Disp: , Rfl:  .  aspirin EC 81 MG tablet, Take 81 mg by mouth daily. , Disp: , Rfl:  .  atorvastatin (LIPITOR) 10 MG tablet, Take 1 tablet (10 mg total) by mouth daily., Disp: 90 tablet, Rfl: 1 .  benzonatate (TESSALON) 100 MG capsule, Take 1-2 capsules (100-200 mg total) by mouth 3 (three) times daily as needed., Disp: 40 capsule, Rfl: 0 .  clopidogrel (PLAVIX) 75 MG tablet, 1 (one) Tablet, Oral, daily, Disp: 90 tablet, Rfl: 1 .  diclofenac sodium (VOLTAREN) 1 % GEL, Apply 2 g topically 4 (four) times daily., Disp: 100 g, Rfl: 0 .  fluticasone (FLONASE) 50 MCG/ACT nasal spray, Place 2 sprays into both nostrils as needed., Disp: 16 g, Rfl: 0 .  gabapentin (NEURONTIN) 400 MG capsule, Take 1 capsule (400 mg total) by mouth 2 (two) times daily., Disp: 180 capsule, Rfl: 1 .   Loratadine 10 MG CAPS, Take 10 mg by mouth daily. , Disp: , Rfl:  .  metoprolol tartrate (LOPRESSOR) 25 MG tablet, Take 0.5 tablets (12.5 mg total) by mouth daily., Disp: 1 tablet, Rfl: 0 .  pantoprazole (PROTONIX) 40 MG tablet, Take 1 tablet (40 mg total) by mouth daily., Disp: 90 tablet, Rfl: 1 .  Probiotic Product (PROBIOTIC PO), Take by mouth daily. , Disp: , Rfl:  .  tamsulosin (FLOMAX) 0.4 MG CAPS capsule, Take 1 capsule (0.4 mg total) by mouth daily., Disp: 90 capsule, Rfl: 1  Allergies  Allergen Reactions  . Breo Ellipta [Fluticasone Furoate-Vilanterol] Rash    Patient stated that it made his mouth break out  . Rosuvastatin  Rash and Other (See Comments)    No reaction per pt, He saw some information regarding Crestor and adverse effects No reaction per pt, He saw some information regarding Crestor and adverse effects No reaction per pt, He saw some information regarding Crestor and adverse effects No reaction per pt, He saw some information regarding Crestor and adverse effects No reaction per pt, He saw some information regarding Crestor and adverse effects    I personally reviewed active problem list, medication list, allergies, family history, social history with the patient/caregiver today.   ROS  Constitutional: Negative for fever or weight change.  Respiratory: Negative for cough , positive for  shortness of breath with very mild activity .   Cardiovascular: Negative for chest pain or palpitations.  Gastrointestinal: Negative for abdominal pain, no bowel changes.  Musculoskeletal: positive  for gait problem and intermittent  joint swelling.  Skin: Negative for rash.  Neurological: Positive  for dizziness when he gets up - intermittent , but no  headache.  No other specific complaints in a complete review of systems (except as listed in HPI above).  Objective  Vitals:   05/28/18 1117  BP: 124/66  Pulse: 70  Resp: 14  Temp: (!) 97.4 F (36.3 C)  TempSrc: Oral   SpO2: 98%  Weight: 199 lb 3.2 oz (90.4 kg)  Height: 6' (1.829 m)    Body mass index is 27.02 kg/m.  Physical Exam  Constitutional: Patient appears well-developed and well-nourished. Overweight.  No distress.  HEENT: head atraumatic, normocephalic, pupils equal and reactive to light, neck supple, throat within normal limits Cardiovascular: Normal rate, regular rhythm and normal heart sounds.  No murmur heard. Trace BLE edema. Pulmonary/Chest: Effort normal and breath sounds normal. No respiratory distress. Abdominal: Soft.  There is no tenderness. Skin: senile purpura  Psychiatric: Patient has a normal mood and affect. behavior is normal. Judgment and thought content normal. Muscular skeletal: atrophy biceps bilaterally, slow gait   PHQ2/9: Depression screen [facility] 2/9 05/28/2018 03/24/2018 02/26/2018 11/21/2017 09/18/2017  Decreased Interest 0 0 0 0 0  Down, Depressed, Hopeless 0 0 0 0 0  PHQ - 2 Score 0 0 0 0 0  Altered sleeping 0 0 0 - -  Tired, decreased energy 0 0 0 - -  Change in appetite 0 0 0 - -  Feeling bad or failure about yourself  0 0 0 - -  Trouble concentrating 0 0 0 - -  Moving slowly or fidgety/restless 0 0 0 - -  Suicidal thoughts 0 0 0 - -  PHQ-9 Score 0 0 0 - -  Difficult doing work/chores Not difficult at all Not difficult at all Not difficult at all - -     Fall Risk: Fall Risk  05/28/2018 03/24/2018 02/26/2018 02/04/2018 11/21/2017  Falls in the past year? No No No No No    Functional Status Survey: Is the patient deaf or have difficulty hearing?: Yes Does the patient have difficulty seeing, even when wearing glasses/contacts?: Yes(patient has cataracts - will make an appt soon) Does the patient have difficulty concentrating, remembering, or making decisions?: No Does the patient have difficulty walking or climbing stairs?: No Does the patient have difficulty dressing or bathing?: No Does the patient have difficulty doing errands alone such as visiting a  doctor's office or shopping?: No    Assessment & Plan  1. Thoracic aortic aneurysm without rupture [facility])  He was seen by Dr. Erven Colla after CT chest but did not discuss results  with him, explained that we can send him back but he is not interested in any intervention. He does not want referral at this time   2. Need for influenza vaccination  - Flu vaccine HIGH DOSE PF  3. Gastroesophageal reflux disease, esophagitis presence not specified  - pantoprazole (PROTONIX) 40 MG tablet; Take 1 tablet (40 mg total) by mouth daily.  Dispense: 90 tablet; Refill: 1  4. 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 Arteriosclerosis of coronary artery  On beta-blocker   6. BPH with obstruction/lower urinary tract symptoms  stable  7. CHF (congestive heart failure), NYHA class III, chronic, systolic ([facility])  Reviewed echo done by Dr. Caryn Bee   8. Essential hypertension  Towards low end of normal, he will see Dr. Nehemiah Massed this week and suggested changing to coreg   9. Atherosclerosis of aorta ([facility])  On statin therapy, plavix

## 2018-05-29 ENCOUNTER — Ambulatory Visit: Payer: Self-pay

## 2018-05-29 NOTE — Telephone Encounter (Signed)
  Reason for Disposition . General information question, no triage required and triager able to answer question  Answer Assessment - Initial Assessment Questions 1. SYMPTOMS: "Do you have any symptoms?"    Pt called stating he had the flu shot yesterday and last night had body aches, muscle aches, didn't get any rest, felt nauseous, and somewhat dizzy. Pt notes he is only a little achy now. He went back to bed this morning and got up at 2:30pm today. He wanted to check with a nurse about symptoms to see if it's common. Stated to pt that these are side effects of the Flu shot and is to be expected. ASsed pt to call back if has high fever 2. SEVERITY: If symptoms are present, ask "Are they mild, moderate or severe?"     Pt states he feels better today than yesterday. Pt having aches to elbows and wrists.  Answer Assessment - Initial Assessment Questions 1. REASON FOR CALL or QUESTION: "What is your reason for calling today?" or "How can I best help you?" or "What question do you have that I can help answer?"      Pt called stating he had the flu shot yesterday and last night had body aches, muscle aches, didn't get any rest, felt nauseous, and somewhat dizzy. Pt notes he is only a little achy now. He went back to bed this morning and got up at 2:30pm today. He wanted to check with a nurse about symptoms to see if it's common. Stated to pt that these are side effects of the Flu shot and is to be expected. ASsed pt to call back if has high fever.  Protocols used: INFORMATION ONLY CALL-A-AH, MEDICATION QUESTION CALL-A-AH

## 2018-06-04 DIAGNOSIS — L570 Actinic keratosis: Secondary | ICD-10-CM | POA: Diagnosis not present

## 2018-06-16 ENCOUNTER — Other Ambulatory Visit: Payer: Self-pay

## 2018-06-16 DIAGNOSIS — E7800 Pure hypercholesterolemia, unspecified: Secondary | ICD-10-CM | POA: Diagnosis not present

## 2018-06-16 DIAGNOSIS — I6523 Occlusion and stenosis of bilateral carotid arteries: Secondary | ICD-10-CM | POA: Diagnosis not present

## 2018-06-16 DIAGNOSIS — R9431 Abnormal electrocardiogram [ECG] [EKG]: Secondary | ICD-10-CM | POA: Insufficient documentation

## 2018-06-16 DIAGNOSIS — I2510 Atherosclerotic heart disease of native coronary artery without angina pectoris: Secondary | ICD-10-CM | POA: Diagnosis not present

## 2018-06-16 DIAGNOSIS — M159 Polyosteoarthritis, unspecified: Secondary | ICD-10-CM

## 2018-06-16 DIAGNOSIS — M150 Primary generalized (osteo)arthritis: Principal | ICD-10-CM

## 2018-06-16 DIAGNOSIS — I10 Essential (primary) hypertension: Secondary | ICD-10-CM | POA: Diagnosis not present

## 2018-06-16 DIAGNOSIS — I255 Ischemic cardiomyopathy: Secondary | ICD-10-CM | POA: Diagnosis not present

## 2018-06-16 MED ORDER — DICLOFENAC SODIUM 1 % TD GEL: 2.0000 g | Freq: Four times a day (QID) | TRANSDERMAL | 0 refills | Status: DC

## 2018-06-16 NOTE — Telephone Encounter (Signed)
Refill request for general medication. Voltaren Gel to [facility].   Last office visit 05/28/2018   Follow up 09/29/2018

## 2018-06-24 ENCOUNTER — Other Ambulatory Visit: Payer: Self-pay | Admitting: Podiatry

## 2018-06-24 ENCOUNTER — Ambulatory Visit (INDEPENDENT_AMBULATORY_CARE_PROVIDER_SITE_OTHER): Payer: PPO

## 2018-06-24 ENCOUNTER — Ambulatory Visit: Payer: PPO | Admitting: Podiatry

## 2018-06-24 ENCOUNTER — Encounter: Payer: Self-pay | Admitting: Podiatry

## 2018-06-24 DIAGNOSIS — M6740 Ganglion, unspecified site: Secondary | ICD-10-CM | POA: Diagnosis not present

## 2018-06-24 DIAGNOSIS — M21611 Bunion of right foot: Secondary | ICD-10-CM

## 2018-06-24 DIAGNOSIS — M2042 Other hammer toe(s) (acquired), left foot: Secondary | ICD-10-CM

## 2018-06-24 DIAGNOSIS — M2041 Other hammer toe(s) (acquired), right foot: Secondary | ICD-10-CM

## 2018-06-24 DIAGNOSIS — M778 Other enthesopathies, not elsewhere classified: Secondary | ICD-10-CM

## 2018-06-24 DIAGNOSIS — B351 Tinea unguium: Secondary | ICD-10-CM | POA: Diagnosis not present

## 2018-06-24 DIAGNOSIS — M79676 Pain in unspecified toe(s): Secondary | ICD-10-CM | POA: Diagnosis not present

## 2018-06-24 DIAGNOSIS — M21612 Bunion of left foot: Secondary | ICD-10-CM | POA: Diagnosis not present

## 2018-06-24 DIAGNOSIS — M779 Enthesopathy, unspecified: Secondary | ICD-10-CM | POA: Diagnosis not present

## 2018-06-25 ENCOUNTER — Other Ambulatory Visit: Payer: PPO | Admitting: Orthotics

## 2018-06-25 NOTE — Progress Notes (Signed)
   Subjective: 84 year old male presenting today with a chief complaint of bilateral foot pain that has been ongoing for several months. He states walking increases the pain and he has not had any treatment for the symptoms.  He also notes elongated, thickened nails of bilateral feet that cause pain when ambulating in shoes. He is unable to trim his own nails. Patient is here for further evaluation and treatment.   Past Medical History:  Diagnosis Date  . Arthritis   . Carotid artery disease ([place])   . Coronary artery disease 2005  . Hyperkalemia   . Hyperlipidemia   . Hypertension   . Past heart attack 2005     Objective: Physical Exam General: The patient is alert and oriented x3 in no acute distress.  Dermatology: Nails are tender, long, thickened and dystrophic with subungual debris, consistent with onychomycosis, 1-5 bilateral. No signs of infection noted. Skin is cool, dry and supple bilateral lower extremities. Negative for open lesions or macerations.  Vascular: Palpable pedal pulses bilaterally. No edema or erythema noted. Capillary refill within normal limits.  Neurological: Epicritic and protective threshold grossly intact bilaterally.   Musculoskeletal Exam: Clinical evidence of bunion deformity noted to the respective foot. There is moderate pain on palpation range of motion of the first MPJ. Lateral deviation of the hallux noted consistent with hallux abductovalgus. Hammertoe contracture also noted on clinical exam to digits 2-5 of the bilateral feet. Symptomatic pain on palpation and range of motion also noted to the metatarsal phalangeal joints of the respective hammertoe digits.   Pain with palpation noted to the midfoot bilaterally. Mucoid cyst noted overlying the DIPJ of the left 3rd toe.   Radiographic Exam: Increased intermetatarsal angle greater than 15 with a hallux abductus angle greater than 30 noted on AP view. Moderate degenerative changes noted within the  first MPJ. Contracture deformity also noted to the interphalangeal joints and MPJs of the digits of the respective hammertoes.    Assessment: 1. HAV w/ bunion deformity bilateral 2. Hammertoe deformity digits 2-5 bilateral  3. Onychodystrophic nails 1-5 bilateral with hyperkeratosis of nails.  4. Onychomycosis of nail due to dermatophyte bilateral   Plan of Care:  1. Patient was evaluated. X-Rays reviewed. 2. Mechanical debridement of nails 1-5 bilaterally performed using a nail nipper. Filed with dremel without incident.  3. Mucoid cyst was drained with a tissue nipper. Antibiotic ointment and a bandage applied.  4. Appointment with Liliane Channel, Pedorthist, for custom molded orthotics.  5. Continue wearing SAS shoes.  6. Return to clinic as needed.    Edrick Kins, DPM [facility]  Dr. Edrick Kins, [address]                                        [place], [place] [address]                Office [phone]  Fax [phone]

## 2018-07-16 ENCOUNTER — Other Ambulatory Visit: Payer: Self-pay | Admitting: Family Medicine

## 2018-07-16 ENCOUNTER — Ambulatory Visit: Payer: PPO | Admitting: Orthotics

## 2018-07-16 DIAGNOSIS — M7751 Other enthesopathy of right foot: Secondary | ICD-10-CM | POA: Diagnosis not present

## 2018-07-16 DIAGNOSIS — M150 Primary generalized (osteo)arthritis: Principal | ICD-10-CM

## 2018-07-16 DIAGNOSIS — M159 Polyosteoarthritis, unspecified: Secondary | ICD-10-CM

## 2018-07-16 DIAGNOSIS — M21612 Bunion of left foot: Secondary | ICD-10-CM

## 2018-07-16 DIAGNOSIS — M2042 Other hammer toe(s) (acquired), left foot: Principal | ICD-10-CM

## 2018-07-16 DIAGNOSIS — M7752 Other enthesopathy of left foot: Secondary | ICD-10-CM | POA: Diagnosis not present

## 2018-07-16 DIAGNOSIS — M2041 Other hammer toe(s) (acquired), right foot: Secondary | ICD-10-CM

## 2018-07-16 DIAGNOSIS — M21611 Bunion of right foot: Secondary | ICD-10-CM

## 2018-07-16 NOTE — Telephone Encounter (Signed)
Refill request for general medication: Diclofenac Sodium   Last office visit: 05/28/2018  Last physical exam: 04/15/2017  Follow-ups on file. 09/29/2018

## 2018-07-16 NOTE — Progress Notes (Signed)
Patient came in today to pick up custom made foot orthotics.  The goals were accomplished and the patient reported no dissatisfaction with said orthotics.  Patient was advised of breakin period and how to report any issues. 

## 2018-08-09 ENCOUNTER — Other Ambulatory Visit: Payer: Self-pay | Admitting: Family Medicine

## 2018-08-09 DIAGNOSIS — G629 Polyneuropathy, unspecified: Secondary | ICD-10-CM

## 2018-08-13 ENCOUNTER — Other Ambulatory Visit: Payer: Self-pay | Admitting: Family Medicine

## 2018-08-13 DIAGNOSIS — M150 Primary generalized (osteo)arthritis: Principal | ICD-10-CM

## 2018-08-13 DIAGNOSIS — M159 Polyosteoarthritis, unspecified: Secondary | ICD-10-CM

## 2018-08-13 NOTE — Telephone Encounter (Signed)
Refill request for general medication: Diclofenac 1% gel  Last office visit: 05/28/2018  Last physical exam: 06/11/2016  Follow-ups on file. 09/29/2018

## 2018-08-20 ENCOUNTER — Other Ambulatory Visit: Payer: Self-pay | Admitting: Family Medicine

## 2018-08-20 DIAGNOSIS — G629 Polyneuropathy, unspecified: Secondary | ICD-10-CM

## 2018-08-20 MED ORDER — GABAPENTIN 400 MG PO CAPS: 400.0000 mg | ORAL_CAPSULE | Freq: Three times a day (TID) | ORAL | 0 refills | Status: DC

## 2018-08-21 DIAGNOSIS — L820 Inflamed seborrheic keratosis: Secondary | ICD-10-CM | POA: Diagnosis not present

## 2018-08-21 DIAGNOSIS — L905 Scar conditions and fibrosis of skin: Secondary | ICD-10-CM | POA: Diagnosis not present

## 2018-08-21 DIAGNOSIS — D485 Neoplasm of uncertain behavior of skin: Secondary | ICD-10-CM | POA: Diagnosis not present

## 2018-08-21 DIAGNOSIS — L578 Other skin changes due to chronic exposure to nonionizing radiation: Secondary | ICD-10-CM | POA: Diagnosis not present

## 2018-08-21 DIAGNOSIS — L570 Actinic keratosis: Secondary | ICD-10-CM | POA: Diagnosis not present

## 2018-08-21 DIAGNOSIS — L821 Other seborrheic keratosis: Secondary | ICD-10-CM | POA: Diagnosis not present

## 2018-08-21 DIAGNOSIS — C4492 Squamous cell carcinoma of skin, unspecified: Secondary | ICD-10-CM

## 2018-08-21 DIAGNOSIS — C44629 Squamous cell carcinoma of skin of left upper limb, including shoulder: Secondary | ICD-10-CM | POA: Diagnosis not present

## 2018-08-21 HISTORY — DX: Squamous cell carcinoma of skin, unspecified: C44.92

## 2018-09-02 ENCOUNTER — Telehealth: Payer: Self-pay | Admitting: Family Medicine

## 2018-09-02 NOTE — Telephone Encounter (Signed)
I called the patient to schedule his AWV-S with Nurse Health Advisor, [place].  (Last AWV 04/15/17).  There was no answer and no option to leave a message.  If the patient calls back, please schedule Medicare AWV-S with Nurse Health Advisor. VDM (DD)

## 2018-09-05 DIAGNOSIS — H2511 Age-related nuclear cataract, right eye: Secondary | ICD-10-CM | POA: Diagnosis not present

## 2018-09-29 ENCOUNTER — Ambulatory Visit (INDEPENDENT_AMBULATORY_CARE_PROVIDER_SITE_OTHER): Payer: PPO | Admitting: Family Medicine

## 2018-09-29 ENCOUNTER — Encounter: Payer: Self-pay | Admitting: Family Medicine

## 2018-09-29 VITALS — BP 100/60 | HR 73 | Temp 97.6°F | Resp 16 | Ht 72.0 in | Wt 202.3 lb

## 2018-09-29 DIAGNOSIS — I700 Atherosclerosis of aorta: Secondary | ICD-10-CM

## 2018-09-29 DIAGNOSIS — D692 Other nonthrombocytopenic purpura: Secondary | ICD-10-CM

## 2018-09-29 DIAGNOSIS — R6889 Other general symptoms and signs: Secondary | ICD-10-CM

## 2018-09-29 DIAGNOSIS — N401 Enlarged prostate with lower urinary tract symptoms: Secondary | ICD-10-CM

## 2018-09-29 DIAGNOSIS — R0989 Other specified symptoms and signs involving the circulatory and respiratory systems: Secondary | ICD-10-CM

## 2018-09-29 DIAGNOSIS — K219 Gastro-esophageal reflux disease without esophagitis: Secondary | ICD-10-CM

## 2018-09-29 DIAGNOSIS — I5022 Chronic systolic (congestive) heart failure: Secondary | ICD-10-CM | POA: Diagnosis not present

## 2018-09-29 DIAGNOSIS — R7303 Prediabetes: Secondary | ICD-10-CM

## 2018-09-29 DIAGNOSIS — J8410 Pulmonary fibrosis, unspecified: Secondary | ICD-10-CM

## 2018-09-29 DIAGNOSIS — I6523 Occlusion and stenosis of bilateral carotid arteries: Secondary | ICD-10-CM

## 2018-09-29 DIAGNOSIS — G629 Polyneuropathy, unspecified: Secondary | ICD-10-CM | POA: Diagnosis not present

## 2018-09-29 DIAGNOSIS — C439 Malignant melanoma of skin, unspecified: Secondary | ICD-10-CM

## 2018-09-29 DIAGNOSIS — I2510 Atherosclerotic heart disease of native coronary artery without angina pectoris: Secondary | ICD-10-CM

## 2018-09-29 DIAGNOSIS — I10 Essential (primary) hypertension: Secondary | ICD-10-CM | POA: Diagnosis not present

## 2018-09-29 DIAGNOSIS — M7989 Other specified soft tissue disorders: Secondary | ICD-10-CM

## 2018-09-29 DIAGNOSIS — E7800 Pure hypercholesterolemia, unspecified: Secondary | ICD-10-CM

## 2018-09-29 DIAGNOSIS — N138 Other obstructive and reflux uropathy: Secondary | ICD-10-CM

## 2018-09-29 MED ORDER — MONTELUKAST SODIUM 10 MG PO TABS: 10.0000 mg | ORAL_TABLET | Freq: Every day | ORAL | 1 refills | Status: DC

## 2018-09-29 MED ORDER — TAMSULOSIN HCL 0.4 MG PO CAPS: 0.4000 mg | ORAL_CAPSULE | Freq: Every day | ORAL | 1 refills | Status: DC

## 2018-09-29 NOTE — Progress Notes (Signed)
Name: Mark [name]   MRN: [id number]    DOB: 04-17-1929   Date:09/29/2018       Progress Note  Subjective  Chief Complaint  Chief Complaint  Patient presents with  . Hypertension  . Hyperlipidemia  . Hyperglycemia  . Throat Problem    dryness, rasp voice    HPI  Pt presents for follow up:  CHF/Cardiomyopathy/Carotid Stenosis/HTN: he is under the care of Dr. Nehemiah Massed. He was sent for evaluation of dizziness. He described dizziness as orthostatic, and he states now he is getting used to getting up slowly. The dose of metoprolol was decreased to 12.5mg  and symptoms improved. He had an echo that showed EF 40 % and mild LVH and mild LV systolic dysfunction. He has some sob with activity - even when showering or walking from his bedroom to his kitchen. He states he does not want further evaluation. He is living at [facility] assisted living and is happy with his life. Symptoms are not bothering him that much. Taking plavix, without issues - no signs or symptoms of bleeding. INTERPRETATION of Echo:  MILD LV SYSTOLIC DYSFUNCTION (See above) WITH MILD LVH NORMAL RIGHT VENTRICULAR SYSTOLIC FUNCTION MODERATE VALVULAR REGURGITATION (See above) NO VALVULAR STENOSIS MODERATE AR MILD MR, TR EF 40%  GERD: taking medication and symptoms are controlled at this time, no heartburn or regurgitation  Hoarse Voice: Had a URI in September 2019, coughed for a couple of months, and has had a hoarse voice since then. No longer has a cough, but does clear throat frequently.  We will add singulair to try to treat for allergies - is taking claritin daily.  BPH: taking medication and denies side effects, urinary frequency is stable, no nocturia.  Lung nodules: seen by Dr. Mortimer Fries. He states he does not want repeat studies. He had a full life and is enjoying life as it is. No changes today.  Atherosclerosis aorta/HLD: discussed results with patient, he is not interested in changing management, also  discussed aneurysm of aorta but he does not want any intervention or referral to vascular surgeon at this time. He is 51 and agree with him at this point.  Taking atorvastatin and plavix.  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 Taking plavix.  Neuropathy: RLE only, taking Gabapentin and this helps with his pain. Still has swelling to this leg.  Hx Melanoma: Seeing dermatologist regularly, no current areas of concern.   Prediabetes: Last A1C 6.0%.  He denies polydipsia, polyphagia, or polyuria.  Will check today.  Patient Active Problem List   Diagnosis Date Noted  . Abnormal ECG 06/16/2018  . Thoracic aortic aneurysm without rupture ([place]) 05/28/2018  . CHF (congestive heart failure), NYHA class II, chronic, systolic ([place]) 05/26/2535  . 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 Cerebellar ataxia in diseases classified elsewhere ([place]) 11/21/2017  . Ischemic cardiomyopathy 06/11/2017  . Benign essential HTN 05/22/2017  . Bilateral carotid artery stenosis 05/22/2017  . Carotid stenosis 02/17/2017  . Neuropathy 01/28/2017  . Right leg paresthesias 11/26/2016  . Hypertension 08/27/2016  . GERD (gastroesophageal reflux disease) 08/27/2016  . Hyperglycemia 05/10/2016  . History of nonmelanoma skin cancer 10/10/2015  . Pain in right shoulder 09/13/2015  . Osteoarthritis 07/06/2015  . Leg swelling 05/30/2015  . Other specified soft tissue disorders 05/30/2015  . Hyperlipidemia 03/29/2015  . Family history of type 2 diabetes mellitus in father 03/29/2015  . Arthritis 02/03/2015  . DD (diverticular disease) 02/03/2015  .  Diverticulosis of intestine without perforation or abscess without bleeding 02/03/2015  . DDD (degenerative disc disease), lumbar 03/03/2014  . Intervertebral disc disorder with radiculopathy of lumbar region 03/03/2014  . Lumbar radiculitis 03/03/2014  . Arteriosclerosis of coronary artery 04/18/2012  . S/P coronary artery  stent placement 04/18/2012  . Malignant melanoma of skin ([place]) 04/18/2012  . Status post percutaneous transluminal coronary angioplasty 04/18/2012    Past Surgical History:  Procedure Laterality Date  . CORONARY ANGIOPLASTY WITH STENT PLACEMENT  2005   [facility]    Family History  Problem Relation Age of Onset  . Hypertension Mother   . Heart disease Mother   . Heart disease Father   . Hypertension Father     Social History   Socioeconomic History  . Marital status: Widowed    Spouse name: Not on file  . Number of children: 3  . Years of education: Not on file  . Highest education level: 10th grade  Occupational History  . Occupation: retired   Scientific laboratory technician  . Financial resource strain: Not hard at all  . Food insecurity:    Worry: Never true    Inability: Never true  . Transportation needs:    Medical: No    Non-medical: No  Tobacco Use  . Smoking status: Former Smoker    Last attempt to quit: 05/16/1963    Years since quitting: 55.4  . Smokeless tobacco: Never Used  . Tobacco comment: quit over 50 years ago  Substance and Sexual Activity  . Alcohol use: No  . Drug use: No  . Sexual activity: Not Currently    Partners: Female  Lifestyle  . Physical activity:    Days per week: 1 day    Minutes per session: 40 min  . Stress: Not at all  Relationships  . Social connections:    Talks on phone: More than three times a week    Gets together: Twice a week    Attends religious service: More than 4 times per year    Active member of club or organization: No    Attends meetings of clubs or organizations: Never    Relationship status: Widowed  . Intimate partner violence:    Fear of current or ex partner: No    Emotionally abused: No    Physically abused: No    Forced sexual activity: No  Other Topics Concern  . Not on file  Social History Narrative   He is going to move in to [facility] independent living and sale his house Nov 2019     Current Outpatient  Medications:  .  acetaminophen (TYLENOL) 500 MG tablet, Take 500 mg by mouth every 6 (six) hours as needed. , Disp: , Rfl:  .  aspirin EC 81 MG tablet, Take 81 mg by mouth daily. , Disp: , Rfl:  .  atorvastatin (LIPITOR) 10 MG tablet, Take 1 tablet (10 mg total) by mouth daily., Disp: 90 tablet, Rfl: 1 .  clopidogrel (PLAVIX) 75 MG tablet, 1 (one) Tablet, Oral, daily, Disp: 90 tablet, Rfl: 1 .  fluticasone (FLONASE) 50 MCG/ACT nasal spray, Place 2 sprays into both nostrils as needed., Disp: 16 g, Rfl: 0 .  gabapentin (NEURONTIN) 400 MG capsule, Take 1 capsule (400 mg total) by mouth 3 (three) times daily., Disp: 360 capsule, Rfl: 0 .  Loratadine 10 MG CAPS, Take 10 mg by mouth daily. , Disp: , Rfl:  .  metoprolol tartrate (LOPRESSOR) 25 MG tablet, Take 0.5 tablets (12.5  mg total) by mouth daily., Disp: 1 tablet, Rfl: 0 .  pantoprazole (PROTONIX) 40 MG tablet, Take 1 tablet (40 mg total) by mouth daily., Disp: 90 tablet, Rfl: 1 .  Probiotic Product (PROBIOTIC PO), Take by mouth daily. , Disp: , Rfl:  .  diclofenac sodium (VOLTAREN) 1 % GEL, Apply 2 g topically 4 (four) times daily. (Patient not taking: Reported on 09/29/2018), Disp: 100 g, Rfl: 0 .  tamsulosin (FLOMAX) 0.4 MG CAPS capsule, Take 1 capsule (0.4 mg total) by mouth daily. (Patient not taking: Reported on 09/29/2018), Disp: 90 capsule, Rfl: 1  Allergies  Allergen Reactions  . Breo Ellipta [Fluticasone Furoate-Vilanterol] Rash    Patient stated that it made his mouth break out  . Rosuvastatin Rash and Other (See Comments)    No reaction per pt, He saw some information regarding Crestor and adverse effects No reaction per pt, He saw some information regarding Crestor and adverse effects No reaction per pt, He saw some information regarding Crestor and adverse effects No reaction per pt, He saw some information regarding Crestor and adverse effects No reaction per pt, He saw some information regarding Crestor and adverse effects    I  personally reviewed active problem list, medication list, allergies, notes from last encounter, lab results with the patient/caregiver today.   Review of Systems  Constitutional: Negative for chills, fever and malaise/fatigue.  HENT: Negative for congestion, ear discharge, ear pain, sinus pain and sore throat.   Eyes: Negative for discharge and redness.  Respiratory: Negative for cough, sputum production, shortness of breath and wheezing.   Cardiovascular: Positive for leg swelling (RLE - chronic). Negative for chest pain and palpitations.  Gastrointestinal: Negative for abdominal pain, blood in stool, diarrhea, nausea and vomiting.  Genitourinary: Negative for dysuria, flank pain, frequency and urgency.  Musculoskeletal: Negative for back pain, joint pain and neck pain.  Skin: Negative for rash.  Neurological: Positive for sensory change (RLE). Negative for dizziness, weakness and headaches.  Endo/Heme/Allergies: Negative for environmental allergies.    Objective  Vitals:   09/29/18 0925  BP: 100/60  Pulse: 73  Resp: 16  Temp: 97.6 F (36.4 C)  TempSrc: Oral  SpO2: 95%  Weight: 202 lb 4.8 oz (91.8 kg)  Height: 6' (1.829 m)   Body mass index is 27.44 kg/m.  Physical Exam Vitals signs and nursing note reviewed.  Constitutional:      General: He is not in acute distress.    Appearance: He is well-developed.  HENT:     Head: Normocephalic and atraumatic.     Right Ear: External ear normal.     Left Ear: External ear normal.     Nose: Nose normal.     Mouth/Throat:     Pharynx: No oropharyngeal exudate.  Eyes:     Conjunctiva/sclera: Conjunctivae normal.     Pupils: Pupils are equal, round, and reactive to light.  Neck:     Musculoskeletal: Normal range of motion and neck supple.     Vascular: No JVD.  Cardiovascular:     Rate and Rhythm: Normal rate and regular rhythm.     Heart sounds: Normal heart sounds.  Pulmonary:     Effort: Pulmonary effort is normal.      Breath sounds: Normal breath sounds.  Abdominal:     General: Bowel sounds are normal.     Palpations: Abdomen is soft.  Musculoskeletal: Normal range of motion.        General: Swelling (+2 in RLE) present.  No tenderness.  Skin:    General: Skin is warm and dry.     Findings: No rash.  Neurological:     Mental Status: He is alert and oriented to person, place, and time.     Cranial Nerves: No cranial nerve deficit.  Psychiatric:        Behavior: Behavior normal.        Thought Content: Thought content normal.        Judgment: Judgment normal.    No results found for this or any previous visit (from the past 72 hour(s)).  PHQ2/9: Depression screen [facility] 2/9 09/29/2018 05/28/2018 03/24/2018 02/26/2018 11/21/2017  Decreased Interest 0 0 0 0 0  Down, Depressed, Hopeless 0 0 0 0 0  PHQ - 2 Score 0 0 0 0 0  Altered sleeping 0 0 0 0 -  Tired, decreased energy 0 0 0 0 -  Change in appetite 0 0 0 0 -  Feeling bad or failure about yourself  0 0 0 0 -  Trouble concentrating 0 0 0 0 -  Moving slowly or fidgety/restless 0 0 0 0 -  Suicidal thoughts 0 0 0 0 -  PHQ-9 Score 0 0 0 0 -  Difficult doing work/chores - Not difficult at all Not difficult at all Not difficult at all -    Fall Risk: Fall Risk  09/29/2018 05/28/2018 03/24/2018 02/26/2018 02/04/2018  Falls in the past year? 0 No No No No  Number falls in past yr: 0 - - - -  Injury with Fall? 0 - - - -   Assessment & Plan 1. CHF (congestive heart failure), NYHA class III, chronic, systolic ([facility]) - Continue current regimen, keep cardiology follow up.  2. Bilateral carotid artery stenosis - Continue current regimen, keep cardiology follow up.  3. Essential hypertension - Continue current regimen, keep cardiology follow up.  Doing well on lower dose of metoprolol, though still low end of normal today, he is asymptomatic. - COMPLETE METABOLIC PANEL WITH GFR  4. Gastroesophageal reflux disease, esophagitis presence not specified Avoid  triggers.   5. Throat clearing - Continue claritin, may consider switching to allegra or adding nasal spray if not improving. - montelukast (SINGULAIR) 10 MG tablet; Take 1 tablet (10 mg total) by mouth at bedtime.  Dispense: 90 tablet; Refill: 1  6. BPH with obstruction/lower urinary tract symptoms - tamsulosin (FLOMAX) 0.4 MG CAPS capsule; Take 1 capsule (0.4 mg total) by mouth daily.  Dispense: 90 capsule; Refill: 1  7. Fibrosis of lung ([place]) - Does not want additional follow up at this time.  8. Atherosclerosis of aorta ([place]) - Does not want additional follow up at this time, continue current medication regimen.  9. Pure hypercholesterolemia - Wants to continue atorvastatin at this time.  10. Senile purpura ([place]) - No areas of concern.  11. Arteriosclerosis of coronary artery - Continue current regimen, keep cardiology follow up.   12. Neuropathy - Continue gabapentin  13. Leg swelling - Stable  14. Malignant melanoma of skin ([place]) - Seeing Dermatology  15. Prediabetes - COMPLETE METABOLIC PANEL WITH GFR - Hemoglobin A1c

## 2018-09-30 LAB — COMPLETE METABOLIC PANEL WITH GFR
AG RATIO: 1.5 (calc) (ref 1.0–2.5)
ALT: 11 U/L (ref 9–46)
AST: 18 U/L (ref 10–35)
Albumin: 3.8 g/dL (ref 3.6–5.1)
Alkaline phosphatase (APISO): 61 U/L (ref 35–144)
BUN: 20 mg/dL (ref 7–25)
CALCIUM: 9 mg/dL (ref 8.6–10.3)
CO2: 28 mmol/L (ref 20–32)
Chloride: 105 mmol/L (ref 98–110)
Creat: 0.89 mg/dL (ref 0.70–1.11)
GFR, EST AFRICAN AMERICAN: 89 mL/min/{1.73_m2} (ref >=60)
GFR, EST NON AFRICAN AMERICAN: 77 mL/min/{1.73_m2} (ref >=60)
GLOBULIN: 2.6 g/dL (ref 1.9–3.7)
Glucose, Bld: 87 mg/dL (ref 65–99)
POTASSIUM: 4.8 mmol/L (ref 3.5–5.3)
SODIUM: 139 mmol/L (ref 135–146)
Total Bilirubin: 0.4 mg/dL (ref 0.2–1.2)
Total Protein: 6.4 g/dL (ref 6.1–8.1)

## 2018-09-30 LAB — HEMOGLOBIN A1C
Hgb A1c MFr Bld: 5.6 % of total Hgb (ref <5.7)
MEAN PLASMA GLUCOSE: 114 (calc)
eAG (mmol/L): 6.3 (calc)

## 2018-10-24 ENCOUNTER — Telehealth: Payer: Self-pay | Admitting: Family Medicine

## 2018-10-24 NOTE — Telephone Encounter (Signed)
Copied from [place] [phone]. Topic: Quick Communication - See Telephone Encounter >> Oct 24, 2018  2:13 PM Rayann Heman wrote: CRM for notification. See Telephone encounter for: 10/24/18. Sadie NP calling from [facility]. She states that she is not taking over the patients care but is here if we need her. She states that the patient is requesting ear drops that we have prescribed before and would like a refill. Pt also has had a lot of swelling and that gabapentin is not helping. She would really like a call back about patient. Please advise  [phone]

## 2018-11-20 DIAGNOSIS — L578 Other skin changes due to chronic exposure to nonionizing radiation: Secondary | ICD-10-CM | POA: Diagnosis not present

## 2018-11-20 DIAGNOSIS — L570 Actinic keratosis: Secondary | ICD-10-CM

## 2018-11-20 DIAGNOSIS — L905 Scar conditions and fibrosis of skin: Secondary | ICD-10-CM | POA: Diagnosis not present

## 2018-11-20 DIAGNOSIS — Z85828 Personal history of other malignant neoplasm of skin: Secondary | ICD-10-CM | POA: Diagnosis not present

## 2018-11-20 DIAGNOSIS — L820 Inflamed seborrheic keratosis: Secondary | ICD-10-CM | POA: Diagnosis not present

## 2018-11-20 DIAGNOSIS — D481 Neoplasm of uncertain behavior of connective and other soft tissue: Secondary | ICD-10-CM | POA: Diagnosis not present

## 2018-11-20 DIAGNOSIS — D485 Neoplasm of uncertain behavior of skin: Secondary | ICD-10-CM

## 2018-11-20 DIAGNOSIS — L821 Other seborrheic keratosis: Secondary | ICD-10-CM | POA: Diagnosis not present

## 2018-11-20 HISTORY — DX: Actinic keratosis: L57.0

## 2018-11-20 HISTORY — DX: Neoplasm of uncertain behavior of skin: D48.5

## 2018-11-25 ENCOUNTER — Other Ambulatory Visit: Payer: Self-pay | Admitting: Family Medicine

## 2018-11-25 DIAGNOSIS — E7800 Pure hypercholesterolemia, unspecified: Secondary | ICD-10-CM

## 2018-11-25 NOTE — Telephone Encounter (Signed)
Refill Request for Cholesterol medication. Atorvastatin 10 mg   Lab Results  Component Value Date   CHOL 144 11/21/2017   HDL 35 (L) 11/21/2017   LDLCALC 87 11/21/2017   TRIG 119 11/21/2017   CHOLHDL 4.1 11/21/2017    Follow up visit: 02/02/2019

## 2018-12-02 DIAGNOSIS — C490 Malignant neoplasm of connective and soft tissue of head, face and neck: Secondary | ICD-10-CM | POA: Diagnosis not present

## 2018-12-09 DIAGNOSIS — L570 Actinic keratosis: Secondary | ICD-10-CM | POA: Diagnosis not present

## 2018-12-10 DIAGNOSIS — E7800 Pure hypercholesterolemia, unspecified: Secondary | ICD-10-CM | POA: Diagnosis not present

## 2018-12-10 DIAGNOSIS — I2510 Atherosclerotic heart disease of native coronary artery without angina pectoris: Secondary | ICD-10-CM | POA: Diagnosis not present

## 2018-12-10 DIAGNOSIS — I255 Ischemic cardiomyopathy: Secondary | ICD-10-CM | POA: Diagnosis not present

## 2018-12-10 DIAGNOSIS — I10 Essential (primary) hypertension: Secondary | ICD-10-CM | POA: Diagnosis not present

## 2018-12-11 DIAGNOSIS — M1711 Unilateral primary osteoarthritis, right knee: Secondary | ICD-10-CM | POA: Diagnosis not present

## 2018-12-11 DIAGNOSIS — M25561 Pain in right knee: Secondary | ICD-10-CM | POA: Diagnosis not present

## 2018-12-15 ENCOUNTER — Other Ambulatory Visit: Payer: Self-pay | Admitting: Family Medicine

## 2018-12-15 DIAGNOSIS — G629 Polyneuropathy, unspecified: Secondary | ICD-10-CM

## 2018-12-15 NOTE — Telephone Encounter (Signed)
Refill request for general medication. Gabapentin to [facility].   Last office visit 09/29/2018   Follow up on 02/02/2019

## 2018-12-23 DIAGNOSIS — C490 Malignant neoplasm of connective and soft tissue of head, face and neck: Secondary | ICD-10-CM | POA: Diagnosis not present

## 2018-12-23 DIAGNOSIS — L988 Other specified disorders of the skin and subcutaneous tissue: Secondary | ICD-10-CM | POA: Diagnosis not present

## 2018-12-25 DIAGNOSIS — C490 Malignant neoplasm of connective and soft tissue of head, face and neck: Secondary | ICD-10-CM | POA: Diagnosis not present

## 2019-01-08 DIAGNOSIS — L98499 Non-pressure chronic ulcer of skin of other sites with unspecified severity: Secondary | ICD-10-CM | POA: Diagnosis not present

## 2019-02-02 ENCOUNTER — Ambulatory Visit: Payer: PPO | Admitting: Family Medicine

## 2019-02-02 ENCOUNTER — Other Ambulatory Visit: Payer: Self-pay

## 2019-02-11 ENCOUNTER — Other Ambulatory Visit: Payer: Self-pay

## 2019-02-11 ENCOUNTER — Encounter: Payer: Self-pay | Admitting: Family Medicine

## 2019-02-11 ENCOUNTER — Ambulatory Visit (INDEPENDENT_AMBULATORY_CARE_PROVIDER_SITE_OTHER): Payer: PPO | Admitting: Family Medicine

## 2019-02-11 VITALS — BP 120/70 | HR 70 | Temp 97.1°F | Resp 16 | Ht 72.0 in | Wt 193.5 lb

## 2019-02-11 DIAGNOSIS — I700 Atherosclerosis of aorta: Secondary | ICD-10-CM | POA: Diagnosis not present

## 2019-02-11 DIAGNOSIS — E7800 Pure hypercholesterolemia, unspecified: Secondary | ICD-10-CM

## 2019-02-11 DIAGNOSIS — D692 Other nonthrombocytopenic purpura: Secondary | ICD-10-CM | POA: Diagnosis not present

## 2019-02-11 DIAGNOSIS — N138 Other obstructive and reflux uropathy: Secondary | ICD-10-CM

## 2019-02-11 DIAGNOSIS — J8410 Pulmonary fibrosis, unspecified: Secondary | ICD-10-CM

## 2019-02-11 DIAGNOSIS — G629 Polyneuropathy, unspecified: Secondary | ICD-10-CM

## 2019-02-11 DIAGNOSIS — I5022 Chronic systolic (congestive) heart failure: Secondary | ICD-10-CM | POA: Diagnosis not present

## 2019-02-11 DIAGNOSIS — I712 Thoracic aortic aneurysm, without rupture, unspecified: Secondary | ICD-10-CM

## 2019-02-11 DIAGNOSIS — K219 Gastro-esophageal reflux disease without esophagitis: Secondary | ICD-10-CM | POA: Diagnosis not present

## 2019-02-11 DIAGNOSIS — N401 Enlarged prostate with lower urinary tract symptoms: Secondary | ICD-10-CM

## 2019-02-11 DIAGNOSIS — R6889 Other general symptoms and signs: Secondary | ICD-10-CM

## 2019-02-11 DIAGNOSIS — R0989 Other specified symptoms and signs involving the circulatory and respiratory systems: Secondary | ICD-10-CM

## 2019-02-11 DIAGNOSIS — I10 Essential (primary) hypertension: Secondary | ICD-10-CM

## 2019-02-11 MED ORDER — ATORVASTATIN CALCIUM 10 MG PO TABS: 10.0000 mg | ORAL_TABLET | Freq: Every day | ORAL | 1 refills | Status: DC

## 2019-02-11 MED ORDER — MONTELUKAST SODIUM 10 MG PO TABS: 10.0000 mg | ORAL_TABLET | Freq: Every day | ORAL | 1 refills | Status: DC

## 2019-02-11 MED ORDER — TAMSULOSIN HCL 0.4 MG PO CAPS: 0.4000 mg | ORAL_CAPSULE | Freq: Every day | ORAL | 1 refills | Status: DC

## 2019-02-11 MED ORDER — GABAPENTIN 400 MG PO CAPS: 400.0000 mg | ORAL_CAPSULE | Freq: Three times a day (TID) | ORAL | 1 refills | Status: DC

## 2019-02-11 MED ORDER — POTASSIUM CHLORIDE ER 10 MEQ PO TBCR: 10.0000 meq | EXTENDED_RELEASE_TABLET | Freq: Every day | ORAL | 0 refills | Status: DC | PRN

## 2019-02-11 NOTE — Progress Notes (Signed)
Name: Mark [name]   MRN: [id number]    DOB: 06/12/1929   Date:02/11/2019       Progress Note  Subjective  Chief Complaint  Chief Complaint  Patient presents with  . Hypertension  . Hyperlipidemia  . Hyperglycemia  . Gastroesophageal Reflux    HPI  CHF: he is under the care of Dr. Nehemiah Massed. The dose of lopressor was decreased and symptoms improved. He had and echo that showed EF 40 % and mild LVH and mild LV systolic dysfunction. He is on half dose of lopressor and dizziness improved, but was given furosemide and states when he takes it he gets dizzy. He was at [facility] living for 6 months, but moved back June 1st because he likes doing things around the house. He has SOB with moderate , no longer getting sob when showering   INTERPRETATION MILD LV SYSTOLIC DYSFUNCTION (See above) WITH MILD LVH NORMAL RIGHT VENTRICULAR SYSTOLIC FUNCTION MODERATE VALVULAR REGURGITATION (See above) NO VALVULAR STENOSIS MODERATE AR MILD MR, TR EF 40%   GERD: taking medication and symptoms are controlled at this time, no heartburn or regurgitation. Unchanged   BPH: taking medication and denies side effects, urinary frequency is working . Stays slow flow but okay   Lung nodules: seen by Dr. Mortimer Fries. He states he does not want repeat studies. No cough or clearing throat anymore doing well on singulair  Atherosclerosis aorta: discussed results with patient, he is not interested in changing management , also discussed aneurysm of aorta, guidelines recommends repeat US in 2024   History of skin cancer: under the care of Dr. Erin Fulling, he has an appointment tomorrow.   OA : right knee, recently had steroid injection and at times difficulty walking   DDD lumbar spine and radiculitis: he has numbness and tingling on right leg only and it affects his walk at times, occasionally numbness on left lower leg. On gabapentin and tolerates medication well    Patient Active Problem List   Diagnosis Date Noted  . Fibrosis of lung ([place]) 09/29/2018  . Senile purpura ([place]) 09/29/2018  . Atherosclerosis of aorta ([place]) 09/29/2018  . Abnormal ECG 06/16/2018  . Thoracic aortic aneurysm without rupture ([place]) 05/28/2018  . CHF (congestive heart failure), NYHA class III, chronic, systolic ([place]) 93/26/7124  . 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 Ischemic cardiomyopathy 06/11/2017  . Benign essential HTN 05/22/2017  . Bilateral carotid artery stenosis 05/22/2017  . Neuropathy 01/28/2017  . Right leg paresthesias 11/26/2016  . Hypertension 08/27/2016  . GERD (gastroesophageal reflux disease) 08/27/2016  . Hyperglycemia 05/10/2016  . History of nonmelanoma skin cancer 10/10/2015  . Pain in right shoulder 09/13/2015  . Osteoarthritis 07/06/2015  . Leg swelling 05/30/2015  . Other specified soft tissue disorders 05/30/2015  . Hyperlipidemia 03/29/2015  . Family history of type 2 diabetes mellitus in father 03/29/2015  . Arthritis 02/03/2015  . DD (diverticular disease) 02/03/2015  . Diverticulosis of intestine without perforation or abscess without bleeding 02/03/2015  . DDD (degenerative disc disease), lumbar 03/03/2014  . Intervertebral disc disorder with radiculopathy of lumbar region 03/03/2014  . Lumbar radiculitis 03/03/2014  . Arteriosclerosis of coronary artery 04/18/2012  . S/P coronary artery stent placement 04/18/2012  . Status post percutaneous transluminal coronary angioplasty 04/18/2012    Past Surgical History:  Procedure Laterality Date  . CORONARY ANGIOPLASTY WITH STENT PLACEMENT  2005   [facility]    Family History  Problem Relation Age of Onset  . Hypertension Mother   .  Heart disease Mother   . Heart disease Father   . Hypertension Father     Social History   Socioeconomic History  . Marital status: Widowed    Spouse name: Not on file  . Number of children: 3  . Years of education: Not on file  . Highest education level:  10th grade  Occupational History  . Occupation: retired   Scientific laboratory technician  . Financial resource strain: Not hard at all  . Food insecurity    Worry: Never true    Inability: Never true  . Transportation needs    Medical: No    Non-medical: No  Tobacco Use  . Smoking status: Former Smoker    Quit date: 05/16/1963    Years since quitting: 55.7  . Smokeless tobacco: Never Used  . Tobacco comment: quit over 50 years ago  Substance and Sexual Activity  . Alcohol use: No  . Drug use: No  . Sexual activity: Not Currently    Partners: Female  Lifestyle  . Physical activity    Days per week: 1 day    Minutes per session: 40 min  . Stress: Not at all  Relationships  . Social connections    Talks on phone: More than three times a week    Gets together: Twice a week    Attends religious service: More than 4 times per year    Active member of club or organization: No    Attends meetings of clubs or organizations: Never    Relationship status: Widowed  . Intimate partner violence    Fear of current or ex partner: No    Emotionally abused: No    Physically abused: No    Forced sexual activity: No  Other Topics Concern  . Not on file  Social History Narrative   He is going to move in to [facility] independent living and sale his house Nov 2019     Current Outpatient Medications:  .  acetaminophen (TYLENOL) 500 MG tablet, Take 500 mg by mouth every 6 (six) hours as needed. , Disp: , Rfl:  .  aspirin EC 81 MG tablet, Take 81 mg by mouth daily. , Disp: , Rfl:  .  atorvastatin (LIPITOR) 10 MG tablet, Take 1 tablet (10 mg total) by mouth daily., Disp: 90 tablet, Rfl: 0 .  clopidogrel (PLAVIX) 75 MG tablet, 1 (one) Tablet, Oral, daily, Disp: 90 tablet, Rfl: 1 .  fluticasone (FLONASE) 50 MCG/ACT nasal spray, Place 2 sprays into both nostrils as needed., Disp: 16 g, Rfl: 0 .  gabapentin (NEURONTIN) 400 MG capsule, Take 1 capsule (400 mg total) by mouth 3 (three) times daily., Disp: 270  capsule, Rfl: 0 .  Loratadine 10 MG CAPS, Take 10 mg by mouth daily. , Disp: , Rfl:  .  metoprolol tartrate (LOPRESSOR) 25 MG tablet, Take 0.5 tablets (12.5 mg total) by mouth daily., Disp: 1 tablet, Rfl: 0 .  montelukast (SINGULAIR) 10 MG tablet, Take 1 tablet (10 mg total) by mouth at bedtime., Disp: 90 tablet, Rfl: 1 .  pantoprazole (PROTONIX) 40 MG tablet, Take 1 tablet (40 mg total) by mouth daily., Disp: 90 tablet, Rfl: 1 .  Probiotic Product (PROBIOTIC PO), Take by mouth daily. , Disp: , Rfl:  .  tamsulosin (FLOMAX) 0.4 MG CAPS capsule, Take 1 capsule (0.4 mg total) by mouth daily., Disp: 90 capsule, Rfl: 1  Allergies  Allergen Reactions  . Breo Ellipta [Fluticasone Furoate-Vilanterol] Rash    Patient stated that  it made his mouth break out  . Rosuvastatin Rash and Other (See Comments)    No reaction per pt, He saw some information regarding Crestor and adverse effects No reaction per pt, He saw some information regarding Crestor and adverse effects No reaction per pt, He saw some information regarding Crestor and adverse effects No reaction per pt, He saw some information regarding Crestor and adverse effects No reaction per pt, He saw some information regarding Crestor and adverse effects    I personally reviewed active problem list, medication list, allergies, family history, social history with the patient/caregiver today.   ROS  Constitutional: Negative for fever or weight change.  Respiratory: Negative for cough and shortness of breath.   Cardiovascular: Negative for chest pain or palpitations.  Gastrointestinal: Negative for abdominal pain, no bowel changes.  Musculoskeletal: positive  For intermittent  gait problem and has bilateral joint swelling.  Skin: Negative for rash.  Neurological: Negative for dizziness or headache.  No other specific complaints in a complete review of systems (except as listed in HPI above).  Objective  Vitals:   02/11/19 0826  BP: 120/70   Pulse: 70  Resp: 16  Temp: (!) 97.1 F (36.2 C)  TempSrc: Temporal  SpO2: 98%  Weight: 193 lb 8 oz (87.8 kg)  Height: 6' (1.829 m)    Body mass index is 26.24 kg/m.  Physical Exam  Constitutional: Patient appears well-developed and well-nourished. No distress.  HEENT: head atraumatic, normocephalic, pupils equal and reactive to light, neck supple Cardiovascular: Normal rate, regular rhythm and normal heart sounds.  No murmur heard. No BLE edema. Pulmonary/Chest: Effort normal and breath sounds normal. No respiratory distress. Abdominal: Soft.  There is no tenderness. Skin: spot on scalp, under care of Dr. Nehemiah Massed, also senile purpura on both arms Psychiatric: Patient has a normal mood and affect. behavior is normal. Judgment and thought content normal.  PHQ2/9: Depression screen [facility] 2/9 02/11/2019 09/29/2018 05/28/2018 03/24/2018 02/26/2018  Decreased Interest 0 0 0 0 0  Down, Depressed, Hopeless 0 0 0 0 0  PHQ - 2 Score 0 0 0 0 0  Altered sleeping 0 0 0 0 0  Tired, decreased energy 0 0 0 0 0  Change in appetite 0 0 0 0 0  Feeling bad or failure about yourself  0 0 0 0 0  Trouble concentrating 0 0 0 0 0  Moving slowly or fidgety/restless 0 0 0 0 0  Suicidal thoughts 0 0 0 0 0  PHQ-9 Score 0 0 0 0 0  Difficult doing work/chores - - Not difficult at all Not difficult at all Not difficult at all  Some recent data might be hidden    phq 9 is negative  Fall Risk: Fall Risk  02/11/2019 09/29/2018 05/28/2018 03/24/2018 02/26/2018  Falls in the past year? 0 0 No No No  Number falls in past yr: 0 0 - - -  Injury with Fall? 0 0 - - -     Functional Status Survey: Is the patient deaf or have difficulty hearing?: No Does the patient have difficulty seeing, even when wearing glasses/contacts?: No Does the patient have difficulty concentrating, remembering, or making decisions?: No Does the patient have difficulty walking or climbing stairs?: No Does the patient have difficulty  dressing or bathing?: No Does the patient have difficulty doing errands alone such as visiting a doctor's office or shopping?: No    Assessment & Plan  1. CHF (congestive heart failure), NYHA class III,  chronic, systolic (HCC)  - COMPLETE METABOLIC PANEL WITH GFR - CBC with Differential/Platelet  2. Atherosclerosis of aorta (HCC)  - Lipid panel  3. Fibrosis of lung ([place])  Seen by Dr. Mortimer Fries in the past, has lung nodules but doing well at this time  4. Senile purpura (HCC)  stable  5. Thoracic aortic aneurysm without rupture [facility])  Seen by Dr. Erven Colla but due for repeat US in 2024   6. Essential hypertension  At goal   7. Aortic atherosclerosis (HCC)  On statin and plavix   8. Gastroesophageal reflux disease, esophagitis presence not specified  Under control   9. Peripheral polyneuropathy  - gabapentin (NEURONTIN) 400 MG capsule; Take 1 capsule (400 mg total) by mouth 3 (three) times daily.  Dispense: 270 capsule; Refill: 1  10. Pure hypercholesterolemia  - atorvastatin (LIPITOR) 10 MG tablet; Take 1 tablet (10 mg total) by mouth daily.  Dispense: 90 tablet; Refill: 1  11. BPH with obstruction/lower urinary tract symptoms  - tamsulosin (FLOMAX) 0.4 MG CAPS capsule; Take 1 capsule (0.4 mg total) by mouth daily.  Dispense: 90 capsule; Refill: 1  12. Throat clearing  - montelukast (SINGULAIR) 10 MG tablet; Take 1 tablet (10 mg total) by mouth at bedtime.  Dispense: 90 tablet; Refill: 1

## 2019-02-12 DIAGNOSIS — I700 Atherosclerosis of aorta: Secondary | ICD-10-CM | POA: Diagnosis not present

## 2019-02-12 DIAGNOSIS — I5022 Chronic systolic (congestive) heart failure: Secondary | ICD-10-CM | POA: Diagnosis not present

## 2019-02-13 LAB — LIPID PANEL
Cholesterol: 120 mg/dL (ref <200)
HDL: 39 mg/dL — ABNORMAL LOW (ref >=40)
LDL Cholesterol (Calc): 64 mg/dL (calc)
Non-HDL Cholesterol (Calc): 81 mg/dL (calc) (ref <130)
Total CHOL/HDL Ratio: 3.1 (calc) (ref <5.0)
Triglycerides: 89 mg/dL (ref <150)

## 2019-02-13 LAB — COMPLETE METABOLIC PANEL WITH GFR
AG Ratio: 1.7 (calc) (ref 1.0–2.5)
ALT: 10 U/L (ref 9–46)
AST: 14 U/L (ref 10–35)
Albumin: 3.8 g/dL (ref 3.6–5.1)
Alkaline phosphatase (APISO): 64 U/L (ref 35–144)
BUN: 22 mg/dL (ref 7–25)
CO2: 25 mmol/L (ref 20–32)
Calcium: 8.7 mg/dL (ref 8.6–10.3)
Chloride: 107 mmol/L (ref 98–110)
Creat: 0.91 mg/dL (ref 0.70–1.11)
GFR, Est African American: 87 mL/min/{1.73_m2} (ref >=60)
GFR, Est Non African American: 75 mL/min/{1.73_m2} (ref >=60)
Globulin: 2.3 g/dL (calc) (ref 1.9–3.7)
Glucose, Bld: 86 mg/dL (ref 65–99)
Potassium: 5 mmol/L (ref 3.5–5.3)
Sodium: 140 mmol/L (ref 135–146)
Total Bilirubin: 0.4 mg/dL (ref 0.2–1.2)
Total Protein: 6.1 g/dL (ref 6.1–8.1)

## 2019-02-13 LAB — CBC WITH DIFFERENTIAL/PLATELET
Absolute Monocytes: 1098 cells/uL — ABNORMAL HIGH (ref 200–950)
Basophils Absolute: 27 cells/uL (ref 0–200)
Basophils Relative: 0.3 %
Eosinophils Absolute: 198 cells/uL (ref 15–500)
Eosinophils Relative: 2.2 %
HCT: 42.6 % (ref 38.5–50.0)
Hemoglobin: 13.8 g/dL (ref 13.2–17.1)
Lymphs Abs: 2952 cells/uL (ref 850–3900)
MCH: 30.7 pg (ref 27.0–33.0)
MCHC: 32.4 g/dL (ref 32.0–36.0)
MCV: 94.9 fL (ref 80.0–100.0)
MPV: 9.8 fL (ref 7.5–12.5)
Monocytes Relative: 12.2 %
Neutro Abs: 4725 cells/uL (ref 1500–7800)
Neutrophils Relative %: 52.5 %
Platelets: 280 10*3/uL (ref 140–400)
RBC: 4.49 10*6/uL (ref 4.20–5.80)
RDW: 11.9 % (ref 11.0–15.0)
Total Lymphocyte: 32.8 %
WBC: 9 10*3/uL (ref 3.8–10.8)

## 2019-02-19 ENCOUNTER — Other Ambulatory Visit: Payer: Self-pay

## 2019-02-19 ENCOUNTER — Encounter (INDEPENDENT_AMBULATORY_CARE_PROVIDER_SITE_OTHER): Payer: Self-pay | Admitting: Vascular Surgery

## 2019-02-19 ENCOUNTER — Ambulatory Visit (INDEPENDENT_AMBULATORY_CARE_PROVIDER_SITE_OTHER): Payer: PPO

## 2019-02-19 ENCOUNTER — Ambulatory Visit (INDEPENDENT_AMBULATORY_CARE_PROVIDER_SITE_OTHER): Payer: PPO | Admitting: Vascular Surgery

## 2019-02-19 VITALS — BP 117/63 | HR 59 | Resp 16 | Wt 197.8 lb

## 2019-02-19 DIAGNOSIS — I2510 Atherosclerotic heart disease of native coronary artery without angina pectoris: Secondary | ICD-10-CM | POA: Diagnosis not present

## 2019-02-19 DIAGNOSIS — I712 Thoracic aortic aneurysm, without rupture, unspecified: Secondary | ICD-10-CM

## 2019-02-19 DIAGNOSIS — Z87891 Personal history of nicotine dependence: Secondary | ICD-10-CM

## 2019-02-19 DIAGNOSIS — I10 Essential (primary) hypertension: Secondary | ICD-10-CM

## 2019-02-19 DIAGNOSIS — I6523 Occlusion and stenosis of bilateral carotid arteries: Secondary | ICD-10-CM

## 2019-02-19 DIAGNOSIS — Z7902 Long term (current) use of antithrombotics/antiplatelets: Secondary | ICD-10-CM

## 2019-02-19 DIAGNOSIS — Z7982 Long term (current) use of aspirin: Secondary | ICD-10-CM | POA: Diagnosis not present

## 2019-02-19 DIAGNOSIS — K219 Gastro-esophageal reflux disease without esophagitis: Secondary | ICD-10-CM | POA: Diagnosis not present

## 2019-02-19 DIAGNOSIS — Z79899 Other long term (current) drug therapy: Secondary | ICD-10-CM | POA: Diagnosis not present

## 2019-02-19 DIAGNOSIS — E7800 Pure hypercholesterolemia, unspecified: Secondary | ICD-10-CM

## 2019-02-19 NOTE — Progress Notes (Signed)
MRN : [id number]  Mark [name] is a 85 y.o. ([birth year]) male who presents with chief complaint of No chief complaint on file. Marland Kitchen  History of Present Illness:   The patient is seen for follow up evaluation of carotid stenosis. The carotid stenosis followed by ultrasound.   The patient denies amaurosis fugax. There is no recent history of TIA symptoms or focal motor deficits. There is no prior documented CVA.  The patient is taking enteric-coated aspirin 81 mg daily.  There is no history of migraine headaches. There is no history of seizures.  The patient has a history of coronary artery disease, no recent episodes of angina or shortness of breath. The patient denies PAD or claudication symptoms. There is a history of hyperlipidemia which is being treated with a statin.  Duplex ultrasound of the carotid arteries obtained today demonstrates a significant worsening of the right internal carotid artery.  Peak systolic velocity is now 341 cm/s but when compared with the ratio of the common carotid this places him in the 80 to 99% stenosis category.  Left internal carotid artery remains less than 30%.  No outpatient medications have been marked as taking for the 02/19/19 encounter (Appointment) with Delana Meyer, Dolores Lory, MD.    Past Medical History:  Diagnosis Date  . Arthritis   . Carotid artery disease ([place])   . Coronary artery disease 2005  . Hyperkalemia   . Hyperlipidemia   . Hypertension   . Past heart attack 2005    Past Surgical History:  Procedure Laterality Date  . CORONARY ANGIOPLASTY WITH STENT PLACEMENT  2005   [facility]    Social History Social History   Tobacco Use  . Smoking status: Former Smoker    Quit date: 05/16/1963    Years since quitting: 55.8  . Smokeless tobacco: Never Used  . Tobacco comment: quit over 50 years ago  Substance Use Topics  . Alcohol use: No  . Drug use: No    Family History Family History  Problem Relation Age of Onset  .  Hypertension Mother   . Heart disease Mother   . Heart disease Father   . Hypertension Father     Allergies  Allergen Reactions  . Breo Ellipta [Fluticasone Furoate-Vilanterol] Rash    Patient stated that it made his mouth break out  . Rosuvastatin Rash and Other (See Comments)    No reaction per pt, He saw some information regarding Crestor and adverse effects No reaction per pt, He saw some information regarding Crestor and adverse effects No reaction per pt, He saw some information regarding Crestor and adverse effects No reaction per pt, He saw some information regarding Crestor and adverse effects No reaction per pt, He saw some information regarding Crestor and adverse effects     REVIEW OF SYSTEMS (Negative unless checked)  Constitutional: [] Weight loss  [] Fever  [] Chills Cardiac: [] Chest pain   [] Chest pressure   [] Palpitations   [] Shortness of breath when laying flat   [] Shortness of breath with exertion. Vascular:  [] Pain in legs with walking   [] Pain in legs at rest  [] History of DVT   [] Phlebitis   [] Swelling in legs   [] Varicose veins   [] Non-healing ulcers Pulmonary:   [] Uses home oxygen   [] Productive cough   [] Hemoptysis   [] Wheeze  [] COPD   [] Asthma Neurologic:  [] Dizziness   [] Seizures   [] History of stroke   [] History of TIA  [] Aphasia   [] Vissual changes   [] Weakness  or numbness in arm   [] Weakness or numbness in leg Musculoskeletal:   [] Joint swelling   [x] Joint pain   [] Low back pain Hematologic:  [] Easy bruising  [] Easy bleeding   [] Hypercoagulable state   [] Anemic Gastrointestinal:  [] Diarrhea   [] Vomiting  [] Gastroesophageal reflux/heartburn   [] Difficulty swallowing. Genitourinary:  [] Chronic kidney disease   [] Difficult urination  [] Frequent urination   [] Blood in urine Skin:  [] Rashes   [] Ulcers  Psychological:  [] History of anxiety   []  History of major depression.  Physical Examination  There were no vitals filed for this visit. There is no height or  weight on file to calculate BMI. Gen: WD/WN, NAD Head: [place]/AT, No temporalis wasting.  Ear/Nose/Throat: Hearing grossly intact, nares w/o erythema or drainage Eyes: PER, EOMI, sclera nonicteric.  Neck: Supple, no large masses.   Pulmonary:  Good air movement, no audible wheezing bilaterally, no use of accessory muscles.  Cardiac: RRR, no JVD Vascular: right carotid bruit Vessel Right Left  Radial Palpable Palpable  Brachial Palpable Palpable  Carotid Palpable Palpable  Gastrointestinal: Non-distended. No guarding/no peritoneal signs.  Musculoskeletal: M/S 5/5 throughout.  No deformity or atrophy.  Neurologic: CN 2-12 intact. Symmetrical.  Speech is fluent. Motor exam as listed above. Psychiatric: Judgment intact, Mood & affect appropriate for pt's clinical situation. Dermatologic: No rashes or ulcers noted.  No changes consistent with cellulitis. Lymph : No lichenification or skin changes of chronic lymphedema.  CBC Lab Results  Component Value Date   WBC 9.0 02/12/2019   HGB 13.8 02/12/2019   HCT 42.6 02/12/2019   MCV 94.9 02/12/2019   PLT 280 02/12/2019    BMET    Component Value Date/Time   NA 140 02/12/2019 0819   NA 140 08/05/2015 1233   K 5.0 02/12/2019 0819   CL 107 02/12/2019 0819   CO2 25 02/12/2019 0819   GLUCOSE 86 02/12/2019 0819   BUN 22 02/12/2019 0819   BUN 15 08/05/2015 1233   CREATININE 0.91 02/12/2019 0819   CALCIUM 8.7 02/12/2019 0819   GFRNONAA 75 02/12/2019 0819   GFRAA 87 02/12/2019 0819   Estimated Creatinine Clearance: 61.6 mL/min (by C-G formula based on SCr of 0.91 mg/dL).  COAG No results found for: INR, PROTIME  Radiology No results found.    Assessment/Plan 1. Bilateral carotid artery stenosis The patient remains asymptomatic with respect to the carotid stenosis.  However, the patient has now progressed and has a lesion the is >70%.  Patient should undergo CT angiography of the carotid arteries to define the degree of stenosis of  the internal carotid arteries bilaterally and the anatomic suitability for surgery vs. intervention.  If the patient does indeed need surgery cardiac clearance will be required, once cleared the patient will be scheduled for surgery.  The risks, benefits and alternative therapies were reviewed in detail with the patient.  All questions were answered.  The patient agrees to proceed with imaging.  Continue antiplatelet therapy as prescribed. Continue management of CAD, HTN and Hyperlipidemia. Healthy heart diet, encouraged exercise at least 4 times per week.   - CT ANGIO NECK W OR WO CONTRAST; Future  2. Thoracic aortic aneurysm without rupture (HCC) We will evaluate the size of his thoracic aneurysm with the CT angiogram of the neck.  - CT Angio Chest W/Cm &/Or Wo Cm; Future  3. Benign essential HTN Continue antihypertensive medications as already ordered, these medications have been reviewed and there are no changes at this time.   4. Arteriosclerosis  of coronary artery Continue cardiac and antihypertensive medications as already ordered and reviewed, no changes at this time.  Continue statin as ordered and reviewed, no changes at this time  Nitrates PRN for chest pain   5. Gastroesophageal reflux disease, esophagitis presence not specified Continue PPI as already ordered, this medication has been reviewed and there are no changes at this time.  Avoidence of caffeine and alcohol  Moderate elevation of the head of the bed   6. Pure hypercholesterolemia Continue statin as ordered and reviewed, no changes at this time    Hortencia Pilar, MD  02/19/2019 10:35 AM

## 2019-02-27 ENCOUNTER — Ambulatory Visit: Payer: PPO

## 2019-02-27 ENCOUNTER — Ambulatory Visit
Admission: RE | Admit: 2019-02-27 | Discharge: 2019-02-27 | Disposition: A | Discharge status: Home / Self Care | Payer: PPO | Source: Ambulatory Visit | Attending: Vascular Surgery | Admitting: Vascular Surgery

## 2019-02-27 ENCOUNTER — Other Ambulatory Visit: Payer: Self-pay

## 2019-02-27 DIAGNOSIS — I712 Thoracic aortic aneurysm, without rupture, unspecified: Secondary | ICD-10-CM

## 2019-02-27 DIAGNOSIS — I6521 Occlusion and stenosis of right carotid artery: Secondary | ICD-10-CM | POA: Diagnosis not present

## 2019-02-27 DIAGNOSIS — I6523 Occlusion and stenosis of bilateral carotid arteries: Secondary | ICD-10-CM | POA: Insufficient documentation

## 2019-02-27 MED ORDER — IOHEXOL 350 MG/ML SOLN
75.0000 mL | Freq: Once | INTRAVENOUS | Status: AC | PRN
  Administered 2019-02-27: 75 mL via INTRAVENOUS

## 2019-03-09 DIAGNOSIS — H2511 Age-related nuclear cataract, right eye: Secondary | ICD-10-CM | POA: Diagnosis not present

## 2019-03-17 ENCOUNTER — Other Ambulatory Visit: Payer: Self-pay | Admitting: Family Medicine

## 2019-03-17 DIAGNOSIS — K219 Gastro-esophageal reflux disease without esophagitis: Secondary | ICD-10-CM

## 2019-03-19 DIAGNOSIS — Z86018 Personal history of other benign neoplasm: Secondary | ICD-10-CM | POA: Diagnosis not present

## 2019-03-19 DIAGNOSIS — L578 Other skin changes due to chronic exposure to nonionizing radiation: Secondary | ICD-10-CM | POA: Diagnosis not present

## 2019-03-19 DIAGNOSIS — L719 Rosacea, unspecified: Secondary | ICD-10-CM | POA: Diagnosis not present

## 2019-03-19 DIAGNOSIS — L570 Actinic keratosis: Secondary | ICD-10-CM | POA: Diagnosis not present

## 2019-03-19 DIAGNOSIS — D692 Other nonthrombocytopenic purpura: Secondary | ICD-10-CM | POA: Diagnosis not present

## 2019-04-10 ENCOUNTER — Telehealth: Payer: Self-pay | Admitting: Family Medicine

## 2019-04-10 NOTE — Chronic Care Management (AMB) (Signed)
°  Chronic Care Management   Outreach Note  04/10/2019 Name: Mark [name] MRN: [id number] DOB: [birth year]  Referred by: Steele Sizer, MD Reason for referral : Chronic Care Management (Initial CCM outreach was unsuccessful. )   An unsuccessful telephone outreach was attempted today. The patient was referred to the case management team by for assistance with chronic care management and care coordination.   Follow Up Plan: The care management team will reach out to the patient again over the next 7 days.   [place]  [email or web address]   ??[phone]

## 2019-04-13 NOTE — Chronic Care Management (AMB) (Signed)
Chronic Care Management   Note  04/13/2019 Name: JEVIN CAMINO MRN: [id number] DOB: 1928-11-11  KHAN CHURA is a 85 y.o. year old male who is a primary care patient of Steele Sizer, MD. I reached out to [name] by phone today in response to a referral sent by Mr. Aaditya Letizia Be[place]hesne's health plan.    Mr. Cornia was given information about Chronic Care Management services today including:  1. CCM service includes personalized support from designated clinical staff supervised by his physician, including individualized plan of care and coordination with other care providers 2. 24/7 contact phone numbers for assistance for urgent and routine care needs. 3. Service will only be billed when office clinical staff spend 20 minutes or more in a month to coordinate care. 4. Only one practitioner may furnish and bill the service in a calendar month. 5. The patient may stop CCM services at any time (effective at the end of the month) by phone call to the office staff. 6. The patient will be responsible for cost sharing (co-pay) of up to 20% of the service fee (after annual deductible is met).  Patient did not agree to enrollment in care management services and does not wish to consider at this time.  Follow up plan: The patient has been provided with contact information for the chronic care management team and has been advised to call with any health related questions or concerns.   [place]  [email or web address]   ??[phone]

## 2019-04-13 NOTE — Chronic Care Management (AMB) (Signed)
°  Chronic Care Management   Outreach Note  04/13/2019 Name: HRITHIK GOERTZ MRN: [id number] DOB: [birth year]  Referred by: Steele Sizer, MD Reason for referral : Chronic Care Management (Initial CCM outreach was unsuccessful. ) and Chronic Care Management (Second CCM utreach was unsuccessful. )   A second unsuccessful telephone outreach was attempted today. The patient was referred to the case management team for assistance with chronic care management and care coordination.   Follow Up Plan: The care management team will reach out to the patient again over the next 7 days.   [place]  [email or web address]   ??[phone]

## 2019-05-19 DIAGNOSIS — L814 Other melanin hyperpigmentation: Secondary | ICD-10-CM | POA: Diagnosis not present

## 2019-05-19 DIAGNOSIS — L821 Other seborrheic keratosis: Secondary | ICD-10-CM | POA: Diagnosis not present

## 2019-05-19 DIAGNOSIS — L820 Inflamed seborrheic keratosis: Secondary | ICD-10-CM | POA: Diagnosis not present

## 2019-05-19 DIAGNOSIS — L570 Actinic keratosis: Secondary | ICD-10-CM | POA: Diagnosis not present

## 2019-05-19 DIAGNOSIS — D692 Other nonthrombocytopenic purpura: Secondary | ICD-10-CM | POA: Diagnosis not present

## 2019-05-19 DIAGNOSIS — Z85831 Personal history of malignant neoplasm of soft tissue: Secondary | ICD-10-CM | POA: Diagnosis not present

## 2019-05-19 DIAGNOSIS — L910 Hypertrophic scar: Secondary | ICD-10-CM | POA: Diagnosis not present

## 2019-06-03 DIAGNOSIS — I6523 Occlusion and stenosis of bilateral carotid arteries: Secondary | ICD-10-CM | POA: Diagnosis not present

## 2019-06-03 DIAGNOSIS — I2510 Atherosclerotic heart disease of native coronary artery without angina pectoris: Secondary | ICD-10-CM | POA: Diagnosis not present

## 2019-06-03 DIAGNOSIS — I255 Ischemic cardiomyopathy: Secondary | ICD-10-CM | POA: Diagnosis not present

## 2019-06-03 DIAGNOSIS — I10 Essential (primary) hypertension: Secondary | ICD-10-CM | POA: Diagnosis not present

## 2019-06-17 DIAGNOSIS — L570 Actinic keratosis: Secondary | ICD-10-CM | POA: Diagnosis not present

## 2019-06-23 ENCOUNTER — Telehealth: Payer: Self-pay | Admitting: Family Medicine

## 2019-06-23 NOTE — Telephone Encounter (Signed)
See request below for ear drops.Medication is not on pt's current med list.

## 2019-06-23 NOTE — Telephone Encounter (Signed)
Medication Refill - Medication: Neomycin-Polymyxin AC (Ear drops)  Has the patient contacted their pharmacy? Yes.   (Agent: If no, request that the patient contact the pharmacy for the refill.) (Agent: If yes, when and what did the pharmacy advise?)  Preferred Pharmacy (with phone number or street name):  [facility] [place], [place]  [place] [address]  Phone: [phone] Fax: [phone]    Agent: Please be advised that RX refills may take up to 3 business days. We ask that you follow-up with your pharmacy.

## 2019-06-24 NOTE — Telephone Encounter (Signed)
Spoke with pharmacist last time he was prescribed Neomycin-Polymyxin AC (ear drops) was by Dr. Manuella Ghazi in 2018. Please advise,.

## 2019-06-29 NOTE — Telephone Encounter (Signed)
Patient notified and stated he thought the drops were for wax build up. Informed patient he could buy OTC Debrox Earwax Removal Kit for ear wax per Dr. Ancil Boozer

## 2019-07-07 ENCOUNTER — Other Ambulatory Visit: Payer: Self-pay

## 2019-07-07 DIAGNOSIS — K219 Gastro-esophageal reflux disease without esophagitis: Secondary | ICD-10-CM

## 2019-07-07 DIAGNOSIS — E7800 Pure hypercholesterolemia, unspecified: Secondary | ICD-10-CM

## 2019-07-07 DIAGNOSIS — G629 Polyneuropathy, unspecified: Secondary | ICD-10-CM

## 2019-07-07 MED ORDER — ATORVASTATIN CALCIUM 10 MG PO TABS: 10.0000 mg | ORAL_TABLET | Freq: Every day | ORAL | 0 refills | Status: DC

## 2019-07-07 MED ORDER — PANTOPRAZOLE SODIUM 40 MG PO TBEC: 40.0000 mg | DELAYED_RELEASE_TABLET | Freq: Every day | ORAL | 0 refills | Status: DC

## 2019-07-07 MED ORDER — GABAPENTIN 400 MG PO CAPS: 400.0000 mg | ORAL_CAPSULE | Freq: Three times a day (TID) | ORAL | 0 refills | Status: DC

## 2019-07-07 NOTE — Telephone Encounter (Signed)
Refill Request for Cholesterol medication. Atorvastatin to [facility].   Refill request for general medication. Gabapentin and Pantoprazole.   Last visit:  02/11/2019   Lab Results  Component Value Date   CHOL 120 02/12/2019   HDL 39 (L) 02/12/2019   LDLCALC 64 02/12/2019   TRIG 89 02/12/2019   CHOLHDL 3.1 02/12/2019    Follow up on 08/13/2018

## 2019-07-10 ENCOUNTER — Telehealth: Payer: Self-pay

## 2019-07-16 NOTE — Telephone Encounter (Signed)
Error

## 2019-08-12 DIAGNOSIS — L578 Other skin changes due to chronic exposure to nonionizing radiation: Secondary | ICD-10-CM | POA: Diagnosis not present

## 2019-08-12 DIAGNOSIS — Z85831 Personal history of malignant neoplasm of soft tissue: Secondary | ICD-10-CM | POA: Diagnosis not present

## 2019-08-12 DIAGNOSIS — L905 Scar conditions and fibrosis of skin: Secondary | ICD-10-CM | POA: Diagnosis not present

## 2019-08-12 DIAGNOSIS — L570 Actinic keratosis: Secondary | ICD-10-CM | POA: Diagnosis not present

## 2019-08-12 DIAGNOSIS — L821 Other seborrheic keratosis: Secondary | ICD-10-CM | POA: Diagnosis not present

## 2019-08-12 DIAGNOSIS — L820 Inflamed seborrheic keratosis: Secondary | ICD-10-CM | POA: Diagnosis not present

## 2019-08-14 ENCOUNTER — Encounter: Payer: Self-pay | Admitting: Family Medicine

## 2019-08-14 ENCOUNTER — Ambulatory Visit (INDEPENDENT_AMBULATORY_CARE_PROVIDER_SITE_OTHER): Payer: Medicare HMO | Admitting: Family Medicine

## 2019-08-14 DIAGNOSIS — J8410 Pulmonary fibrosis, unspecified: Secondary | ICD-10-CM | POA: Diagnosis not present

## 2019-08-14 DIAGNOSIS — I700 Atherosclerosis of aorta: Secondary | ICD-10-CM | POA: Diagnosis not present

## 2019-08-14 DIAGNOSIS — I6523 Occlusion and stenosis of bilateral carotid arteries: Secondary | ICD-10-CM

## 2019-08-14 DIAGNOSIS — N401 Enlarged prostate with lower urinary tract symptoms: Secondary | ICD-10-CM | POA: Diagnosis not present

## 2019-08-14 DIAGNOSIS — G629 Polyneuropathy, unspecified: Secondary | ICD-10-CM

## 2019-08-14 DIAGNOSIS — I5022 Chronic systolic (congestive) heart failure: Secondary | ICD-10-CM

## 2019-08-14 DIAGNOSIS — Z23 Encounter for immunization: Secondary | ICD-10-CM

## 2019-08-14 DIAGNOSIS — I712 Thoracic aortic aneurysm, without rupture, unspecified: Secondary | ICD-10-CM

## 2019-08-14 DIAGNOSIS — D692 Other nonthrombocytopenic purpura: Secondary | ICD-10-CM

## 2019-08-14 DIAGNOSIS — I10 Essential (primary) hypertension: Secondary | ICD-10-CM

## 2019-08-14 DIAGNOSIS — R6889 Other general symptoms and signs: Secondary | ICD-10-CM

## 2019-08-14 DIAGNOSIS — R0989 Other specified symptoms and signs involving the circulatory and respiratory systems: Secondary | ICD-10-CM

## 2019-08-14 DIAGNOSIS — J3089 Other allergic rhinitis: Secondary | ICD-10-CM

## 2019-08-14 DIAGNOSIS — N138 Other obstructive and reflux uropathy: Secondary | ICD-10-CM

## 2019-08-14 MED ORDER — MONTELUKAST SODIUM 10 MG PO TABS: 10.0000 mg | ORAL_TABLET | Freq: Every day | ORAL | 1 refills | Status: DC

## 2019-08-14 MED ORDER — GABAPENTIN 400 MG PO CAPS: 400.0000 mg | ORAL_CAPSULE | Freq: Three times a day (TID) | ORAL | 1 refills | Status: DC

## 2019-08-14 NOTE — Progress Notes (Signed)
Name: Mark [name]   MRN: [id number]    DOB: 11/08/1928   Date:08/14/2019       Progress Note  Subjective  Chief Complaint  Chief Complaint  Patient presents with  . Follow-up    6 month follow up    I connected with  Mark [name] on 08/14/19 at  8:40 AM EST by telephone and verified that I am speaking with the correct person using two identifiers.  I discussed the limitations, risks, security and privacy concerns of performing an evaluation and management service by telephone and the availability of in person appointments. Staff also discussed with the patient that there may be a patient responsible charge related to this service. Patient Location: at home  Provider Location: [facility]   HPI  CHF: he is under the care of Dr. Nehemiah Massed.  He had and echo that showed EF 40 % and mild LVH and mild LV systolic dysfunction. He is on half dose of lopressor and dizziness improved, he states no swelling and has not been taking furosemide or potassium lately   He was at [facility] for 6 months, but moved back June 1st 2020 because he likes doing things around the house. He has SOB with moderate , he denies orthopnea . He status post STEMI and PCI with stent to mid LAD in 2005 .   INTERPRETATION Echo done [id number]  MILD LV SYSTOLIC DYSFUNCTION (See above) WITH MILD LVH NORMAL RIGHT VENTRICULAR SYSTOLIC FUNCTION MODERATE VALVULAR REGURGITATION  NO VALVULAR STENOSIS MODERATE AR MILD MR, TR EF 40%   GERD: taking medication and symptoms are controlled at this time, no heartburn or regurgitation. Unchanged   BPH: he stopped taking flomax and has not noticed any lower urinary symptoms, no nocturia , denies weak stream   Lung nodules: seen by Dr. Mortimer Fries. He states he does not want repeat studies. No cough or clearing throat anymore doing well on singulair and loratadine   AR: denies sneezing, rhinorrhea or cough . He states no longer using nasal spray     Atherosclerosis aorta: discussed results with patient, he is not interested in changing management , also discussed aneurysm of aorta, guidelines recommends repeat US in 2024 . He is still taking Atorvastatin   History of skin cancer: under the care of Dr. Erin Fulling, no new lesions  DDD lumbar spine and radiculitis: he has numbness and tingling on right leg only and it affects his walk at times, occasionally numbness on left lower leg. On gabapentin and tolerates medication well , only taking it twice a day and symptoms are controlled   Patient Active Problem List   Diagnosis Date Noted  . 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 Senile purpura ([place]) 09/29/2018  . Atherosclerosis of aorta ([place]) 09/29/2018  . Abnormal ECG 06/16/2018  . Thoracic aortic aneurysm without rupture ([place]) 05/28/2018  . CHF (congestive heart failure), NYHA class III, chronic, systolic ([place]) [id number]  . 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 Ischemic cardiomyopathy 06/11/2017  . Benign essential HTN 05/22/2017  . Bilateral carotid artery stenosis 05/22/2017  . Neuropathy 01/28/2017  . Right leg paresthesias 11/26/2016  . Hypertension 08/27/2016  . GERD (gastroesophageal reflux disease) 08/27/2016  . Hyperglycemia 05/10/2016  . History of nonmelanoma skin cancer 10/10/2015  . Pain in right shoulder 09/13/2015  . Osteoarthritis 07/06/2015  . Leg swelling 05/30/2015  . Other specified soft tissue disorders 05/30/2015  . Hyperlipidemia 03/29/2015  . Family history of  type 2 diabetes mellitus in father 03/29/2015  . DD (diverticular disease) 02/03/2015  . Diverticulosis of intestine without perforation or abscess without bleeding 02/03/2015  . DDD (degenerative disc disease), lumbar 03/03/2014  . Intervertebral disc disorder with radiculopathy of lumbar region 03/03/2014  . Lumbar radiculitis 03/03/2014  . Lumbar stenosis with neurogenic claudication 03/03/2014  . Arteriosclerosis  of coronary artery 04/18/2012  . S/P coronary artery stent placement 04/18/2012  . Status post percutaneous transluminal coronary angioplasty 04/18/2012  . Malignant melanoma of skin, unspecified ([place]) 04/18/2012    Past Surgical History:  Procedure Laterality Date  . CORONARY ANGIOPLASTY WITH STENT PLACEMENT  2005   [facility]    Family History  Problem Relation Age of Onset  . Hypertension Mother   . Heart disease Mother   . Heart disease Father   . Hypertension Father      Current Outpatient Medications:  .  acetaminophen (TYLENOL) 500 MG tablet, Take 500 mg by mouth every 6 (six) hours as needed. , Disp: , Rfl:  .  aspirin EC 81 MG tablet, Take 81 mg by mouth daily. , Disp: , Rfl:  .  atorvastatin (LIPITOR) 10 MG tablet, Take 1 tablet (10 mg total) by mouth daily., Disp: 90 tablet, Rfl: 0 .  gabapentin (NEURONTIN) 400 MG capsule, Take 1 capsule (400 mg total) by mouth 3 (three) times daily., Disp: 270 capsule, Rfl: 0 .  metoprolol tartrate (LOPRESSOR) 25 MG tablet, Take 0.5 tablets (12.5 mg total) by mouth daily., Disp: 1 tablet, Rfl: 0 .  montelukast (SINGULAIR) 10 MG tablet, Take 1 tablet (10 mg total) by mouth at bedtime., Disp: 90 tablet, Rfl: 1 .  pantoprazole (PROTONIX) 40 MG tablet, Take 1 tablet (40 mg total) by mouth daily., Disp: 90 tablet, Rfl: 0 .  Probiotic Product (PROBIOTIC PO), Take by mouth daily. , Disp: , Rfl:  .  clopidogrel (PLAVIX) 75 MG tablet, 1 (one) Tablet, Oral, daily (Patient not taking: Reported on 08/14/2019), Disp: 90 tablet, Rfl: 1 .  fluticasone (FLONASE) 50 MCG/ACT nasal spray, Place 2 sprays into both nostrils as needed. (Patient not taking: Reported on 08/14/2019), Disp: 16 g, Rfl: 0 .  furosemide (LASIX) 20 MG tablet, Take 20 mg by mouth daily., Disp: , Rfl:  .  Loratadine 10 MG CAPS, Take 10 mg by mouth daily. , Disp: , Rfl:  .  potassium chloride (K-DUR) 10 MEQ tablet, Take 1 tablet (10 mEq total) by mouth daily as needed. When you take the  furosemide (Patient not taking: Reported on 08/14/2019), Disp: 30 tablet, Rfl: 0 .  tamsulosin (FLOMAX) 0.4 MG CAPS capsule, Take 1 capsule (0.4 mg total) by mouth daily. (Patient not taking: Reported on 08/14/2019), Disp: 90 capsule, Rfl: 1  Allergies  Allergen Reactions  . Breo Ellipta [Fluticasone Furoate-Vilanterol] Rash    Patient stated that it made his mouth break out  . Rosuvastatin Rash and Other (See Comments)    No reaction per pt, He saw some information regarding Crestor and adverse effects No reaction per pt, He saw some information regarding Crestor and adverse effects No reaction per pt, He saw some information regarding Crestor and adverse effects No reaction per pt, He saw some information regarding Crestor and adverse effects No reaction per pt, He saw some information regarding Crestor and adverse effects    I personally reviewed active problem list, medication list, allergies, family history, social history with the patient/caregiver today.   ROS  Ten systems reviewed and is negative except as  mentioned in HPI   Objective  Virtual encounter, vitals not obtained.  There is no height or weight on file to calculate BMI.  Physical Exam  Awake, alert and oriented   PHQ2/9: Depression screen [facility] 2/9 08/14/2019 02/11/2019 09/29/2018 05/28/2018 03/24/2018  Decreased Interest 0 0 0 0 0  Down, Depressed, Hopeless 0 0 0 0 0  PHQ - 2 Score 0 0 0 0 0  Altered sleeping 0 0 0 0 0  Tired, decreased energy 1 0 0 0 0  Change in appetite 0 0 0 0 0  Feeling bad or failure about yourself  0 0 0 0 0  Trouble concentrating 0 0 0 0 0  Moving slowly or fidgety/restless 0 0 0 0 0  Suicidal thoughts 0 0 0 0 0  PHQ-9 Score 1 0 0 0 0  Difficult doing work/chores Not difficult at all - - Not difficult at all Not difficult at all  Some recent data might be hidden   PHQ-2/9 Result is negative.    Fall Risk: Fall Risk  08/14/2019 02/11/2019 09/29/2018 05/28/2018 03/24/2018  Falls in the  past year? 1 0 0 No No  Number falls in past yr: 0 0 0 - -  Injury with Fall? 0 0 0 - -     Assessment & Plan  1. CHF (congestive heart failure), NYHA class III, chronic, systolic (HCC)  Doing well  2. Atherosclerosis of aorta ([place])   3. 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 Mortimer Fries  4. Essential hypertension  No longer feeling dizzy   5. Thoracic aortic aneurysm without rupture ([place])  Recheck in 2024   6. Senile purpura ([place])  Per patient is stable  7. BPH with obstruction/lower urinary tract symptoms  Resolved   8. Bilateral carotid artery stenosis   9. Peripheral polyneuropathy  - gabapentin (NEURONTIN) 400 MG capsule; Take 1 capsule (400 mg total) by mouth 3 (three) times daily.  Dispense: 180 capsule; Refill: 1  10. Needs flu shot  Not done , he came in but cma did not go outside at the scheduled time  11. Perennial allergic rhinitis  - montelukast (SINGULAIR) 10 MG tablet; Take 1 tablet (10 mg total) by mouth at bedtime.  Dispense: 90 tablet; Refill: 1  I discussed the assessment and treatment plan with the patient. The patient was provided an opportunity to ask questions and all were answered. The patient agreed with the plan and demonstrated an understanding of the instructions.   The patient was advised to call back or seek an in-person evaluation if the symptoms worsen or if the condition fails to improve as anticipated.  I provided 25  minutes of non-face-to-face time during this encounter.  Loistine Chance, MD

## 2019-08-17 ENCOUNTER — Ambulatory Visit (INDEPENDENT_AMBULATORY_CARE_PROVIDER_SITE_OTHER): Payer: Medicare HMO

## 2019-08-17 ENCOUNTER — Other Ambulatory Visit: Payer: Self-pay

## 2019-08-17 DIAGNOSIS — Z23 Encounter for immunization: Secondary | ICD-10-CM

## 2019-09-01 ENCOUNTER — Other Ambulatory Visit: Payer: PPO

## 2019-09-01 ENCOUNTER — Ambulatory Visit: Payer: Self-pay | Admitting: *Deleted

## 2019-09-01 ENCOUNTER — Ambulatory Visit: Payer: PPO | Attending: Internal Medicine

## 2019-09-01 DIAGNOSIS — Z20822 Contact with and (suspected) exposure to covid-19: Secondary | ICD-10-CM | POA: Diagnosis not present

## 2019-09-01 DIAGNOSIS — U071 COVID-19: Secondary | ICD-10-CM

## 2019-09-01 HISTORY — DX: COVID-19: U07.1

## 2019-09-01 NOTE — Telephone Encounter (Signed)
I returned his call.   He was exposed to his sister and granddaughter 13 days ago and they have since tested positive for COVID.   They thought they had a cold because both of them were sick.    We were in the car together.   When we got to the place to eat I forgot my mask.   My granddaughter let me borrow an extra one she had from her purse.    I used her mask and "I guess I breathed in all her sick germs".    Should I be tested?  I let him know he should be tested.   Tomorrow will end his 14 day quarantine period but he still should be tested.  I scheduled him to be tested at the site in [place] at the [facility] at 11:15 today.    Reason for Disposition . COVID-19 Testing, questions about  Answer Assessment - Initial Assessment Questions 1. COVID-19 CLOSE CONTACT: "Who is the person with the confirmed or suspected COVID-19 infection that you were exposed to?"     My sister was diagnosed with COVID-19.   My granddaughter let me borrow her mask because I forgot mine.   However she was having congestion and symptoms.     I'm feeling short of breath, chest congestion I'm coughing up clear stuff.    2. PLACE of CONTACT: "Where were you when you were exposed to COVID-19?" (e.g., home, school, medical waiting room; which city?)     I was in the car with my sister and granddaughter.     3. TYPE of CONTACT: "How much contact was there?" (e.g., sitting next to, live in same house, work in same office, same building)     We were in the car together.   I also borrowed my granddaughter's mask and she was sick also. 4. DURATION of CONTACT: "How long were you in contact with the COVID-19 patient?" (e.g., a few seconds, passed by person, a few minutes, 15 minutes or longer, live with the patient)     *No Answer* 5. MASK: "Were you wearing a mask?" "Was the other person wearing a mask?" Note: wearing a mask reduces the risk of an otherwise close contact.     Yes see above   It was not mine. 6.  DATE of CONTACT: "When did you have contact with a COVID-19 patient?" (e.g., how many days ago)     Tomorrow will be 14 days.   7. COMMUNITY SPREAD: "Are there lots of cases of COVID-19 (community spread) where you live?" (See public health department website, if unsure)       Yes 8. SYMPTOMS: "Do you have any symptoms?" (e.g., fever, cough, breathing difficulty, loss of taste or smell)     Yes chest congestion, drainage in my throat, mild cough. 9. PREGNANCY OR POSTPARTUM: "Is there any chance you are pregnant?" "When was your last menstrual period?" "Did you deliver in the last 2 weeks?"     N/A 10. HIGH RISK: "Do you have any heart or lung problems? Do you have a weak immune system?" (e.g., heart failure, COPD, asthma, HIV positive, chemotherapy, renal failure, diabetes mellitus, sickle cell anemia, obesity)       I had stent put in my heart in 2005. 11. TRAVEL: "Have you traveled out of the country recently?" If so, "When and where?" Also ask about out-of-state travel, since the CDC has identified some high-risk cities for community spread in the [place] Note: Travel  becomes less relevant if there is widespread community transmission where the patient lives.       Not asked  Protocols used: CORONAVIRUS (COVID-19) DIAGNOSED OR SUSPECTED-A-AH, CORONAVIRUS (COVID-19) EXPOSURE-A-AH

## 2019-09-02 LAB — NOVEL CORONAVIRUS, NAA: SARS-CoV-2, NAA: DETECTED — AB

## 2019-09-03 ENCOUNTER — Encounter: Payer: Self-pay | Admitting: Family Medicine

## 2019-09-03 ENCOUNTER — Other Ambulatory Visit: Payer: Self-pay | Admitting: Family Medicine

## 2019-09-03 ENCOUNTER — Ambulatory Visit
Admission: RE | Admit: 2019-09-03 | Discharge: 2019-09-03 | Disposition: A | Discharge status: Home / Self Care | Payer: Medicare HMO | Attending: Family Medicine | Admitting: Family Medicine

## 2019-09-03 ENCOUNTER — Ambulatory Visit
Admission: RE | Admit: 2019-09-03 | Discharge: 2019-09-03 | Disposition: A | Discharge status: Home / Self Care | Payer: Medicare HMO | Source: Ambulatory Visit | Attending: Family Medicine | Admitting: Family Medicine

## 2019-09-03 ENCOUNTER — Ambulatory Visit (INDEPENDENT_AMBULATORY_CARE_PROVIDER_SITE_OTHER): Payer: Medicare HMO | Admitting: Family Medicine

## 2019-09-03 ENCOUNTER — Telehealth (HOSPITAL_COMMUNITY): Payer: Self-pay | Admitting: Nurse Practitioner

## 2019-09-03 VITALS — Temp 98.5°F | Ht 72.0 in | Wt 197.0 lb

## 2019-09-03 DIAGNOSIS — J8410 Pulmonary fibrosis, unspecified: Secondary | ICD-10-CM

## 2019-09-03 DIAGNOSIS — J988 Other specified respiratory disorders: Secondary | ICD-10-CM

## 2019-09-03 DIAGNOSIS — R0602 Shortness of breath: Secondary | ICD-10-CM

## 2019-09-03 DIAGNOSIS — U071 COVID-19: Secondary | ICD-10-CM

## 2019-09-03 DIAGNOSIS — J984 Other disorders of lung: Secondary | ICD-10-CM | POA: Diagnosis not present

## 2019-09-03 DIAGNOSIS — R7612 Nonspecific reaction to cell mediated immunity measurement of gamma interferon antigen response without active tuberculosis: Secondary | ICD-10-CM | POA: Diagnosis not present

## 2019-09-03 MED ORDER — AZITHROMYCIN 250 MG PO TABS: 250.0000 mg | ORAL_TABLET | Freq: Every day | ORAL | 0 refills | Status: DC

## 2019-09-03 MED ORDER — PREDNISONE 20 MG PO TABS: ORAL_TABLET | ORAL | 0 refills | Status: DC

## 2019-09-03 MED ORDER — ALBUTEROL SULFATE HFA 108 (90 BASE) MCG/ACT IN AERS: 2.0000 | INHALATION_SPRAY | Freq: Four times a day (QID) | RESPIRATORY_TRACT | 0 refills | Status: DC | PRN

## 2019-09-03 NOTE — Telephone Encounter (Signed)
Called to discuss with [name] about Covid symptoms and the use of bamlanivimab, a monoclonal antibody infusion for those with mild to moderate Covid symptoms and at a high risk of hospitalization.    Pt does not qualify for infusion therapy as his symptoms first presented > 10 days prior to timing of infusion. Symptoms tier reviewed as well as criteria for ending isolation. Preventative practices reviewed. Patient verbalized understanding    Patient Active Problem List   Diagnosis Date Noted  . 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 Senile purpura ([place]) 09/29/2018  . Atherosclerosis of aorta ([place]) 09/29/2018  . Abnormal ECG 06/16/2018  . Thoracic aortic aneurysm without rupture ([place]) 05/28/2018  . CHF (congestive heart failure), NYHA class III, chronic, systolic ([place]) [id number]  . 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 Ischemic cardiomyopathy 06/11/2017  . Benign essential HTN 05/22/2017  . Bilateral carotid artery stenosis 05/22/2017  . Neuropathy 01/28/2017  . Right leg paresthesias 11/26/2016  . Hypertension 08/27/2016  . GERD (gastroesophageal reflux disease) 08/27/2016  . Hyperglycemia 05/10/2016  . History of nonmelanoma skin cancer 10/10/2015  . Pain in right shoulder 09/13/2015  . Osteoarthritis 07/06/2015  . Leg swelling 05/30/2015  . Other specified soft tissue disorders 05/30/2015  . Hyperlipidemia 03/29/2015  . Family history of type 2 diabetes mellitus in father 03/29/2015  . DD (diverticular disease) 02/03/2015  . Diverticulosis of intestine without perforation or abscess without bleeding 02/03/2015  . DDD (degenerative disc disease), lumbar 03/03/2014  . Intervertebral disc disorder with radiculopathy of lumbar region 03/03/2014  . Lumbar radiculitis 03/03/2014  . Lumbar stenosis with neurogenic claudication 03/03/2014  . Arteriosclerosis of coronary artery 04/18/2012  . S/P coronary artery stent placement 04/18/2012  . Status post  percutaneous transluminal coronary angioplasty 04/18/2012  . Malignant melanoma of skin, unspecified ([place]) 04/18/2012    Beckey Rutter, DNP, AGNP-C [facility]  [email or web address]: [id number] ([employer])

## 2019-09-03 NOTE — Progress Notes (Signed)
Name: Mark [name]   MRN: [id number]    DOB: Aug 11, 1928   Date:09/03/2019       Progress Note  Subjective:    Chief Complaint  Chief Complaint  Patient presents with  . Follow-up    dx with covid 2 days ago, chest tightness, little cough, and little sob    I connected with  [name] on 09/03/19 at 10:20 AM EST by telephone and verified that I am speaking with the correct person using two identifiers.   I discussed the limitations, risks, security and privacy concerns of performing an evaluation and management service by telephone and the availability of in person appointments. Staff also discussed with the patient that there may be a patient responsible charge related to this service. Patient Location: home Provider Location: [facility] Additional Individuals present: none  HPI Pt had covid exposure about 2 weeks ago, his sister was positive He developed some URI sx sniffing and coughing about 2 days later, he says  He did get tested until yesterday and it was positive No fever, chills, sweats  He reports getting a little winded or labored breathing with walking to the mailbox or chores around the house.  He says he hasn't been distressed, no CP, near syncope, but he will have to stop for a few seconds and catch his breath.  He otherwise feels like he's "almost over this thing" except for the newer SOB    95% and HR 60 while on the phone.   He was not sure about the readings initially and did report some lower numbers at different times 89-52, 90, then up to 92 and 95 again - so unclear how well the pulse ox was reading.    Patient Active Problem List   Diagnosis Date Noted  . 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 Senile purpura ([place]) 09/29/2018  . Atherosclerosis of aorta ([place]) 09/29/2018  . Abnormal ECG 06/16/2018  . Thoracic aortic aneurysm without rupture ([place]) 05/28/2018  . CHF (congestive heart failure), NYHA class III, chronic, systolic ([place]) [id number]  . BPH  with obstruction/lower urinary tract symptoms 02/26/2018  . Ischemic cardiomyopathy 06/11/2017  . Benign essential HTN 05/22/2017  . Bilateral carotid artery stenosis 05/22/2017  . Neuropathy 01/28/2017  . Right leg paresthesias 11/26/2016  . Hypertension 08/27/2016  . GERD (gastroesophageal reflux disease) 08/27/2016  . Hyperglycemia 05/10/2016  . History of nonmelanoma skin cancer 10/10/2015  . Pain in right shoulder 09/13/2015  . Osteoarthritis 07/06/2015  . Leg swelling 05/30/2015  . Other specified soft tissue disorders 05/30/2015  . Hyperlipidemia 03/29/2015  . Family history of type 2 diabetes mellitus in father 03/29/2015  . DD (diverticular disease) 02/03/2015  . Diverticulosis of intestine without perforation or abscess without bleeding 02/03/2015  . DDD (degenerative disc disease), lumbar 03/03/2014  . Intervertebral disc disorder with radiculopathy of lumbar region 03/03/2014  . Lumbar radiculitis 03/03/2014  . Lumbar stenosis with neurogenic claudication 03/03/2014  . Arteriosclerosis of coronary artery 04/18/2012  . S/P coronary artery stent placement 04/18/2012  . Status post percutaneous transluminal coronary angioplasty 04/18/2012  . Malignant melanoma of skin, unspecified ([place]) 04/18/2012    Social History   Tobacco Use  . Smoking status: Former Smoker    Quit date: 05/16/1963    Years since quitting: 56.3  . Smokeless tobacco: Never Used  . Tobacco comment: quit over 50 years ago  Substance Use Topics  . Alcohol use: No     Current Outpatient Medications:  .  aspirin EC 81 MG tablet, Take 81 mg by mouth daily. , Disp: , Rfl:  .  atorvastatin (LIPITOR) 10 MG tablet, Take 1 tablet (10 mg total) by mouth daily., Disp: 90 tablet, Rfl: 0 .  gabapentin (NEURONTIN) 400 MG capsule, Take 1 capsule (400 mg total) by mouth 3 (three) times daily., Disp: 180 capsule, Rfl: 1 .  Loratadine 10 MG CAPS, Take 10 mg by mouth daily. , Disp: , Rfl:  .  metoprolol tartrate  (LOPRESSOR) 25 MG tablet, Take 0.5 tablets (12.5 mg total) by mouth daily., Disp: 1 tablet, Rfl: 0 .  montelukast (SINGULAIR) 10 MG tablet, Take 1 tablet (10 mg total) by mouth at bedtime., Disp: 90 tablet, Rfl: 1 .  pantoprazole (PROTONIX) 40 MG tablet, Take 1 tablet (40 mg total) by mouth daily., Disp: 90 tablet, Rfl: 0 .  Probiotic Product (PROBIOTIC PO), Take by mouth daily. , Disp: , Rfl:  .  furosemide (LASIX) 20 MG tablet, Take 20 mg by mouth daily., Disp: , Rfl:  .  potassium chloride (K-DUR) 10 MEQ tablet, Take 1 tablet (10 mEq total) by mouth daily as needed. When you take the furosemide (Patient not taking: Reported on 08/14/2019), Disp: 30 tablet, Rfl: 0  Allergies  Allergen Reactions  . Breo Ellipta [Fluticasone Furoate-Vilanterol] Rash    Patient stated that it made his mouth break out  . Rosuvastatin Rash and Other (See Comments)    No reaction per pt, He saw some information regarding Crestor and adverse effects No reaction per pt, He saw some information regarding Crestor and adverse effects No reaction per pt, He saw some information regarding Crestor and adverse effects No reaction per pt, He saw some information regarding Crestor and adverse effects No reaction per pt, He saw some information regarding Crestor and adverse effects    Chart Review: I personally reviewed active problem list, medication list, allergies, family history, social history, health maintenance, notes from last encounter, lab results, imaging with the patient/caregiver today.   Review of Systems  Constitutional: Negative.   HENT: Negative.   Eyes: Negative.   Respiratory: Negative.   Cardiovascular: Negative.   Gastrointestinal: Negative.   Endocrine: Negative.   Genitourinary: Negative.   Musculoskeletal: Negative.   Skin: Negative.   Allergic/Immunologic: Negative.   Neurological: Negative.   Hematological: Negative.   Psychiatric/Behavioral: Negative.   All other systems reviewed and  are negative.    Objective:    Virtual encounter, vitals limited, only able to obtain the following Today's Vitals   09/03/19 1030  Temp: 98.5 F (36.9 C)  Weight: 197 lb (89.4 kg)  Height: 6' (1.829 m)     Body mass index is 26.72 kg/m. Nursing Note and Vital Signs reviewed.  Physical Exam Vitals and nursing note reviewed.  Neurological:     Mental Status: He is alert.   phonation clear, no audible wheeze or stridor No audible tachypnea or distress heard  PE limited by telephone encounter  Results for orders placed or performed in visit on 09/01/19 (from the past 72 hour(s))  Novel Coronavirus, NAA ([facility])     Status: Abnormal   Collection Time: 09/01/19 11:10 AM   Specimen: Nasopharyngeal(NP) swabs in vial transport medium   NASOPHARYNGE  TESTING  Result Value Ref Range   SARS-CoV-2, NAA Detected (A) Not Detected    Comment: This nucleic acid amplification test was developed and its performance characteristics determined by [employer]. Nucleic acid amplification tests include RT-PCR and TMA. This test has not  been FDA cleared or approved. This test has been authorized by FDA under an Emergency Use Authorization (EUA). This test is only authorized for the duration of time the declaration that circumstances exist justifying the authorization of the emergency use of in vitro diagnostic tests for detection of SARS-CoV-2 virus and/or diagnosis of COVID-19 infection under section 564(b)(1) of the Act, 21 U.S.C. [id number]) (1), unless the authorization is terminated or revoked sooner. When diagnostic testing is negative, the possibility of a false negative result should be considered in the context of a patient's recent exposures and the presence of clinical signs and symptoms consistent with COVID-19. An individual without symptoms of COVID-19 and who is not shedding SARS-CoV-2 virus wo uld expect to have a negative (not detected) result in this assay.      Assessment and Plan:     ICD-10-CM   1. Shortness of breath  R06.02 DG Chest 2 View  2. Respiratory tract infection due to COVID-19 virus  U07.1 DG Chest 2 View   J98.8    86 y/o male with COVID+ respiratory sx.  Sx onset was 13 d ago after exposure to sister who was found positive.  Most URI type sx are improving, but he has some SOB, chest tightness and very mild cough - respiratory sx are newer and worse with mild activity/exertion.   He was able to give me some VS - he is a little unsure about the pulse ox readings and did give me some a little lower 89-90, and most readings 92, 95, 96%.   I wanted to do a stat CXR to r/o a new developing pneumonia.  Pt was instructed to monitor his pulseOx, watch for a steady wave form, and if it goes below 90% he would need to go to the hospital for eval - he would likely qualify for admission and start of IV tx, supplemental O2 if hypoxic etc.   -Red flags and when to present for emergency care or RTC including but not limited to new/worsening/un-resolving symptoms, reviewed with patient at time of visit. Follow up and care instructions discussed and provided in AVS. - I discussed the assessment and treatment plan with the patient. The patient was provided an opportunity to ask questions and all were answered. The patient agreed with the plan and demonstrated an understanding of the instructions.  - The patient was advised to call back or seek an in-person evaluation if the symptoms worsen or if the condition fails to improve as anticipated.  I provided 18 minutes of non-face-to-face time during this encounter.  Delsa Grana, PA-C 09/03/19 10:45 AM

## 2019-09-04 ENCOUNTER — Ambulatory Visit: Admission: RE | Admit: 2019-09-04 | Payer: Medicare HMO | Source: Ambulatory Visit | Admitting: *Deleted

## 2019-09-04 ENCOUNTER — Ambulatory Visit (INDEPENDENT_AMBULATORY_CARE_PROVIDER_SITE_OTHER): Payer: Medicare HMO | Admitting: Family Medicine

## 2019-09-04 VITALS — BP 104/57 | HR 69 | Temp 97.6°F | Wt 193.4 lb

## 2019-09-04 DIAGNOSIS — J988 Other specified respiratory disorders: Secondary | ICD-10-CM | POA: Insufficient documentation

## 2019-09-04 DIAGNOSIS — U071 COVID-19: Secondary | ICD-10-CM

## 2019-09-04 DIAGNOSIS — I10 Essential (primary) hypertension: Secondary | ICD-10-CM

## 2019-09-04 NOTE — Progress Notes (Signed)
Subjective:     Mark [name] is a 86 y.o. male presenting for Shortness of Breath (pt states he has been having symptoms 17 days now, tested positive on 09/01/19)  86 yo M with HTN, pulmonary fibrosis, CHF, ischemic cardiomyopathy diagnosed with Covid-19 on 2/3 who presents with worsening SOB    Shortness of Breath This is a new problem. The current episode started 1 to 4 weeks ago. The problem has been gradually improving. Associated symptoms include rhinorrhea and sputum production. Pertinent negatives include no fever, headaches, sore throat or vomiting. The symptoms are aggravated by any activity.   Notes he is eating and drinking well Dizziness - did have some dizziness earlier today. Took his BP and 101/67 which is a little low for him - cardiologist is working to regulate his BP and has been reducing his BP medication  SOB mostly with activity, but occasionally difficulty taking a deep breath at rest.   Has been trying to take deep breaths   Recently prescribed Azithromycin - started today Also on prednisone which he will start   Review of Systems  Constitutional: Negative for fever.  HENT: Positive for rhinorrhea. Negative for sore throat.   Respiratory: Positive for sputum production and shortness of breath.   Gastrointestinal: Negative for vomiting.  Neurological: Negative for headaches.    09/03/2019: Telemedicine - diagnosed with covid on 2/3, but symptoms started 13 days prior. CXR w/o signs of pneumonia. Pulse ox at home with ?desat  Social History   Tobacco Use  Smoking Status Former Smoker  . Quit date: 05/16/1963  . Years since quitting: 56.3  Smokeless Tobacco Never Used  Tobacco Comment   quit over 50 years ago        Objective:    BP Readings from Last 3 Encounters:  09/04/19 (!) 104/57  02/19/19 117/63  02/11/19 120/70   Wt Readings from Last 3 Encounters:  09/04/19 193 lb 6.4 oz (87.7 kg)  09/03/19 197 lb (89.4 kg)  02/19/19 197 lb 12.8 oz  (89.7 kg)    BP (!) 104/57   Pulse 69   Temp 97.6 F (36.4 C) (Oral)   Wt 193 lb 6.4 oz (87.7 kg)   SpO2 96% Comment: after 2 minute walk test  BMI 26.23 kg/m    Physical Exam Constitutional:      General: He is not in acute distress.    Appearance: He is well-developed. He is not ill-appearing.  HENT:     Head: Normocephalic and atraumatic.     Right Ear: Tympanic membrane and ear canal normal.     Left Ear: Tympanic membrane and ear canal normal.     Nose: Mucosal edema and rhinorrhea present.     Right Sinus: No maxillary sinus tenderness or frontal sinus tenderness.     Left Sinus: No maxillary sinus tenderness or frontal sinus tenderness.     Mouth/Throat:     Pharynx: Uvula midline. Posterior oropharyngeal erythema present. No oropharyngeal exudate.     Tonsils: 0 on the right. 0 on the left.  Cardiovascular:     Rate and Rhythm: Normal rate and regular rhythm.     Heart sounds: Murmur present.  Pulmonary:     Effort: Pulmonary effort is normal. No respiratory distress.     Breath sounds: Rales (scattered) present. No decreased breath sounds, wheezing or rhonchi.  Musculoskeletal:     Cervical back: Neck supple.  Lymphadenopathy:     Cervical: No cervical adenopathy.  Skin:  General: Skin is warm and dry.     Capillary Refill: Capillary refill takes less than 2 seconds.  Neurological:     Mental Status: He is alert.           Assessment & Plan:   Problem List Items Addressed This Visit      Cardiovascular and Mediastinum   Hypertension    BP low here and in the office. Pt is otherwise asymptomatic. Reviewed BP medication. Epic with 12.5 mg daily of metoprolol though pt reports taking BID. Reviewed cardiology note and their system indicates BID however they have been decreasing. Advised holding evening metoprolol given HR 69 and low BP. Hold parameters provided and advised calling cardiology next week if BP remaining low. As recently started on prednisone  expect his bp to be stable.         Respiratory   Respiratory tract infection due to COVID-19 virus - Primary    PCP prescribed prednisone, azithromycin, and albuterol today. Pt notes already improving. Normal pulse ox at rest and with ambulation. Discussed that SOB with covid can last up to 3 months. Return/ER precautions discussed. Hand out with breathing exercises provided.           Return if symptoms worsen or fail to improve.  Lesleigh Noe, MD

## 2019-09-04 NOTE — Assessment & Plan Note (Signed)
BP low here and in the office. Pt is otherwise asymptomatic. Reviewed BP medication. Epic with 12.5 mg daily of metoprolol though pt reports taking BID. Reviewed cardiology note and their system indicates BID however they have been decreasing. Advised holding evening metoprolol given HR 69 and low BP. Hold parameters provided and advised calling cardiology next week if BP remaining low. As recently started on prednisone expect his bp to be stable.

## 2019-09-04 NOTE — Assessment & Plan Note (Signed)
PCP prescribed prednisone, azithromycin, and albuterol today. Pt notes already improving. Normal pulse ox at rest and with ambulation. Discussed that SOB with covid can last up to 3 months. Return/ER precautions discussed. Hand out with breathing exercises provided.

## 2019-09-04 NOTE — Patient Instructions (Addendum)
1) Do not take you evening Metoprolol tonight 2) If your blood pressure <110/80 -- do not take metoprolol  3) Do breathing exercises in hand out 4) If blood pressure continues to be low > call your cardiologist on Monday for guidance   Your shortness of breath may last up to 2-3 months.   Go to the ER or see your primary care doctor if you breathing worsens, you get chest pain, or feel lightheaded

## 2019-09-09 DIAGNOSIS — H2513 Age-related nuclear cataract, bilateral: Secondary | ICD-10-CM | POA: Diagnosis not present

## 2019-09-09 DIAGNOSIS — H35371 Puckering of macula, right eye: Secondary | ICD-10-CM | POA: Diagnosis not present

## 2019-09-14 ENCOUNTER — Telehealth: Payer: Self-pay | Admitting: *Deleted

## 2019-09-14 ENCOUNTER — Ambulatory Visit: Payer: Self-pay | Admitting: *Deleted

## 2019-09-14 ENCOUNTER — Other Ambulatory Visit: Payer: Self-pay

## 2019-09-14 ENCOUNTER — Encounter: Payer: Self-pay | Admitting: Family Medicine

## 2019-09-14 ENCOUNTER — Ambulatory Visit
Admission: EM | Admit: 2019-09-14 | Discharge: 2019-09-14 | Disposition: A | Discharge status: Home / Self Care | Payer: Medicare HMO | Attending: Family Medicine | Admitting: Family Medicine

## 2019-09-14 DIAGNOSIS — R42 Dizziness and giddiness: Secondary | ICD-10-CM | POA: Diagnosis not present

## 2019-09-14 DIAGNOSIS — J8410 Pulmonary fibrosis, unspecified: Secondary | ICD-10-CM | POA: Diagnosis not present

## 2019-09-14 DIAGNOSIS — U071 COVID-19: Secondary | ICD-10-CM | POA: Diagnosis not present

## 2019-09-14 DIAGNOSIS — R55 Syncope and collapse: Secondary | ICD-10-CM | POA: Insufficient documentation

## 2019-09-14 DIAGNOSIS — S8992XA Unspecified injury of left lower leg, initial encounter: Secondary | ICD-10-CM | POA: Diagnosis not present

## 2019-09-14 DIAGNOSIS — S0993XA Unspecified injury of face, initial encounter: Secondary | ICD-10-CM | POA: Diagnosis not present

## 2019-09-14 DIAGNOSIS — I700 Atherosclerosis of aorta: Secondary | ICD-10-CM | POA: Diagnosis not present

## 2019-09-14 DIAGNOSIS — I712 Thoracic aortic aneurysm, without rupture: Secondary | ICD-10-CM | POA: Diagnosis not present

## 2019-09-14 DIAGNOSIS — I6529 Occlusion and stenosis of unspecified carotid artery: Secondary | ICD-10-CM | POA: Diagnosis not present

## 2019-09-14 DIAGNOSIS — I5022 Chronic systolic (congestive) heart failure: Secondary | ICD-10-CM | POA: Diagnosis not present

## 2019-09-14 DIAGNOSIS — M25562 Pain in left knee: Secondary | ICD-10-CM | POA: Diagnosis not present

## 2019-09-14 DIAGNOSIS — I951 Orthostatic hypotension: Secondary | ICD-10-CM | POA: Diagnosis not present

## 2019-09-14 DIAGNOSIS — C439 Malignant melanoma of skin, unspecified: Secondary | ICD-10-CM | POA: Diagnosis not present

## 2019-09-14 DIAGNOSIS — I2510 Atherosclerotic heart disease of native coronary artery without angina pectoris: Secondary | ICD-10-CM | POA: Diagnosis not present

## 2019-09-14 DIAGNOSIS — R918 Other nonspecific abnormal finding of lung field: Secondary | ICD-10-CM | POA: Diagnosis not present

## 2019-09-14 DIAGNOSIS — I5020 Unspecified systolic (congestive) heart failure: Secondary | ICD-10-CM | POA: Diagnosis not present

## 2019-09-14 DIAGNOSIS — Z8616 Personal history of COVID-19: Secondary | ICD-10-CM | POA: Diagnosis not present

## 2019-09-14 DIAGNOSIS — I252 Old myocardial infarction: Secondary | ICD-10-CM | POA: Diagnosis not present

## 2019-09-14 DIAGNOSIS — I444 Left anterior fascicular block: Secondary | ICD-10-CM | POA: Diagnosis not present

## 2019-09-14 LAB — GLUCOSE, CAPILLARY: Glucose-Capillary: 100 mg/dL — ABNORMAL HIGH (ref 70–99)

## 2019-09-14 NOTE — Telephone Encounter (Signed)
Diagnosed positive Covid 19 on 09/01/19. Improving most days. Using albuterol inhaler prn when up and active which is when he feels winded. Denies CP, able to take deep breaths/denies SOB with resting. Care Advice including continue to use albuterol inhaler as needed/increase daily water intake/increase your daily activity slowly. Rest when needed. If you feel worse/chest tightness/continued wheezing call back or seek treatment at the ED with a mask on. Also, inquiring when he can schedule the 1st  Covid vaccine. Reviewed time period with the patient. Will call him with vaccine appointment.   Reason for Disposition . Health Information question, no triage required and triager able to answer question  Answer Assessment - Initial Assessment Questions 1. REASON FOR CALL or QUESTION: "What is your reason for calling today?" or "How can I best help you?" or "What question do you have that I can help answer?"    How long to wait till I can schedule my 1st Covid 19 vaccine once I've been diagnosed with Covid.  Protocols used: INFORMATION ONLY CALL-A-AH

## 2019-09-14 NOTE — ED Provider Notes (Signed)
[facility]    CSN: [id number] Arrival date & time: 09/14/19  1515      History   Chief Complaint Chief Complaint  Patient presents with  . Loss of Consciousness    HPI Mark [name] is a 86 y.o. male.   86 yo male with a h/o CAD, h/o MI, carotid artery disease presents with a c/o fainting episode around noon today while at home. States he was walking to the kitchen when he "felt woozy" and then woke up on the floor. States he passed out but does not know for sure how long. States he thinks it may "have been just a few seconds". States must have hit his head and face because of right forehead and cheek pain and swelling. Denies any chest pains, palpitations, shortness of breath, vision changes, numbness/tingling, unilateral weakness. C/o left knee pain and swelling also.    Loss of Consciousness   Past Medical History:  Diagnosis Date  . Arthritis   . Carotid artery disease ([place])   . Coronary artery disease 2005  . Hyperkalemia   . Hyperlipidemia   . Hypertension   . Past heart attack 2005    Patient Active Problem List   Diagnosis Date Noted  . Respiratory tract infection due to COVID-19 virus 09/04/2019  . Fibrosis of lung ([place]) 09/29/2018  . Senile purpura ([place]) 09/29/2018  . Atherosclerosis of aorta ([place]) 09/29/2018  . Abnormal ECG 06/16/2018  . Thoracic aortic aneurysm without rupture ([place]) 05/28/2018  . CHF (congestive heart failure), NYHA class III, chronic, systolic ([place]) [id number]  . 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 Ischemic cardiomyopathy 06/11/2017  . Benign essential HTN 05/22/2017  . Bilateral carotid artery stenosis 05/22/2017  . Neuropathy 01/28/2017  . Right leg paresthesias 11/26/2016  . Hypertension 08/27/2016  . GERD (gastroesophageal reflux disease) 08/27/2016  . Hyperglycemia 05/10/2016  . History of nonmelanoma skin cancer 10/10/2015  . Pain in right shoulder 09/13/2015  . Osteoarthritis  07/06/2015  . Leg swelling 05/30/2015  . Other specified soft tissue disorders 05/30/2015  . Hyperlipidemia 03/29/2015  . Family history of type 2 diabetes mellitus in father 03/29/2015  . DD (diverticular disease) 02/03/2015  . Diverticulosis of intestine without perforation or abscess without bleeding 02/03/2015  . DDD (degenerative disc disease), lumbar 03/03/2014  . Intervertebral disc disorder with radiculopathy of lumbar region 03/03/2014  . Lumbar radiculitis 03/03/2014  . Lumbar stenosis with neurogenic claudication 03/03/2014  . Arteriosclerosis of coronary artery 04/18/2012  . S/P coronary artery stent placement 04/18/2012  . Status post percutaneous transluminal coronary angioplasty 04/18/2012  . Malignant melanoma of skin, unspecified ([place]) 04/18/2012    Past Surgical History:  Procedure Laterality Date  . CORONARY ANGIOPLASTY WITH STENT PLACEMENT  2005   [facility]       Home Medications    Prior to Admission medications   Medication Sig Start Date End Date Taking? Authorizing Provider  famotidine (PEPCID) 20 MG tablet Take 20 mg by mouth 2 (two) times daily.   Yes [provider]  albuterol (VENTOLIN HFA) 108 (90 Base) MCG/ACT inhaler Inhale 2 puffs into the lungs every 6 (six) hours as needed for wheezing or shortness of breath. 09/03/19   Delsa Grana, PA-C  aspirin EC 81 MG tablet Take 81 mg by mouth daily.     [provider]  atorvastatin (LIPITOR) 10 MG tablet Take 1 tablet (10 mg total) by mouth daily. 07/07/19   Steele Sizer, MD  azithromycin (ZITHROMAX Z-PAK)  250 MG tablet Take 1 tablet (250 mg total) by mouth daily. 500mg  PO day 1, then 250mg  PO days 2-5 09/03/19   Delsa Grana, PA-C  furosemide (LASIX) 20 MG tablet Take 20 mg by mouth daily. 01/22/19   [provider]  gabapentin (NEURONTIN) 400 MG capsule Take 1 capsule (400 mg total) by mouth 3 (three) times daily. 08/14/19   Steele Sizer, MD  Loratadine 10 MG CAPS Take 10 mg by  mouth daily.     [provider]  metoprolol tartrate (LOPRESSOR) 25 MG tablet Take 0.5 tablets (12.5 mg total) by mouth daily. 02/04/18   Poulose, Bethel Born, NP  montelukast (SINGULAIR) 10 MG tablet Take 1 tablet (10 mg total) by mouth at bedtime. 08/14/19   Steele Sizer, MD  pantoprazole (PROTONIX) 40 MG tablet Take 1 tablet (40 mg total) by mouth daily. 07/07/19   Steele Sizer, MD  potassium chloride (K-DUR) 10 MEQ tablet Take 1 tablet (10 mEq total) by mouth daily as needed. When you take the furosemide Patient not taking: Reported on 09/04/2019 02/11/19   Steele Sizer, MD  predniSONE (DELTASONE) 20 MG tablet 3 tabs poqday 1-2, 2 tabs poqday 3-4, 1 tab poqday 5-6 09/03/19   Delsa Grana, PA-C  Probiotic Product (PROBIOTIC PO) Take by mouth daily.     [provider]    Family History Family History  Problem Relation Age of Onset  . Hypertension Mother   . Heart disease Mother   . Heart disease Father   . Hypertension Father     Social History Social History   Tobacco Use  . Smoking status: Former Smoker    Quit date: 05/16/1963    Years since quitting: 56.3  . Smokeless tobacco: Never Used  . Tobacco comment: quit over 50 years ago  Substance Use Topics  . Alcohol use: No  . Drug use: No     Allergies   Breo ellipta [fluticasone furoate-vilanterol] and Rosuvastatin   Review of Systems Review of Systems  Cardiovascular: Positive for syncope.     Physical Exam Triage Vital Signs ED Triage Vitals  Enc Vitals Group     BP 09/14/19 1529 113/70     Pulse Rate 09/14/19 1529 70     Resp 09/14/19 1529 16     Temp 09/14/19 1529 97.7 F (36.5 C)     Temp Source 09/14/19 1529 Oral     SpO2 09/14/19 1529 98 %     Weight 09/14/19 1537 193 lb 9 oz (87.8 kg)     Height --      Head Circumference --      Peak Flow --      Pain Score 09/14/19 1531 1     Pain Loc --      Pain Edu? --      Excl. in [place]? --    No data found.  Updated Vital Signs BP  113/70 (BP Location: Left Arm)   Pulse 70   Temp 97.7 F (36.5 C) (Oral)   Resp 16   Wt 87.8 kg   SpO2 98%   BMI 26.25 kg/m   Visual Acuity Right Eye Distance:   Left Eye Distance:   Bilateral Distance:    Right Eye Near:   Left Eye Near:    Bilateral Near:     Physical Exam Vitals and nursing note reviewed.  Constitutional:      General: He is not in acute distress.    Appearance: He is not toxic-appearing  or diaphoretic.  HENT:     Head: Contusion (to right upper cheek area) present.     Right Ear: Tympanic membrane normal.     Left Ear: Tympanic membrane normal.  Eyes:     Extraocular Movements: Extraocular movements intact.     Pupils: Pupils are equal, round, and reactive to light.  Cardiovascular:     Rate and Rhythm: Normal rate and regular rhythm.     Heart sounds: Normal heart sounds.  Pulmonary:     Effort: Pulmonary effort is normal. No respiratory distress.     Breath sounds: Normal breath sounds.  Neurological:     General: No focal deficit present.     Mental Status: He is alert and oriented to person, place, and time.     Cranial Nerves: No cranial nerve deficit.     Sensory: No sensory deficit.     Motor: No weakness.     Coordination: Coordination normal.     Gait: Gait normal.     Deep Tendon Reflexes: Reflexes normal.      UC Treatments / Results  Labs (all labs ordered are listed, but only abnormal results are displayed) Labs Reviewed  GLUCOSE, CAPILLARY - Abnormal; Notable for the following components:      Result Value   Glucose-Capillary 100 (*)    All other components within normal limits  CBG MONITORING, ED    EKG   Radiology No results found.  Procedures ED EKG  Date/Time: 09/14/2019 6:38 PM Performed by: Norval Gable, MD Authorized by: Norval Gable, MD   ECG reviewed by ED Physician in the absence of a cardiologist: yes   Previous ECG:    Previous ECG:  Unavailable Interpretation:    Interpretation: abnormal    Rate:    ECG rate:  69   ECG rate assessment: normal   Rhythm:    Rhythm: sinus rhythm   Conduction:    Conduction: abnormal     Abnormal conduction: LAFB   ST segments:    ST segments:  Normal T waves:    T waves: normal   Q waves:    Q waves:  V1, V2, V3 and V4   (including critical care time)  Medications Ordered in UC Medications - No data to display  Initial Impression / Assessment and Plan / UC Course  I have reviewed the triage vital signs and the nursing notes.  Pertinent labs & imaging results that were available during my care of the patient were reviewed by me and considered in my medical decision making (see chart for details).      Final Clinical Impressions(s) / UC Diagnoses   Final diagnoses:  Syncope, unspecified syncope type  Facial injury, initial encounter  Injury of left knee, initial encounter     Discharge Instructions     Recommend patient go to Emergency Department for further evaluation and management    ED Prescriptions    None      1. Lab/ekg results and diagnosis reviewed with patient 2. Recommend patient go to Emergency Department for further evaluation and management; patient refuses transport by EMS; states he will have his sister drive him to [facility]; patient verbalizes understanding and risks.  PDMP not reviewed this encounter.   Norval Gable, MD 09/14/19 [phone]

## 2019-09-14 NOTE — Discharge Instructions (Signed)
Recommend patient go to Emergency Department for further evaluation and management °

## 2019-09-14 NOTE — Telephone Encounter (Signed)
Spoke with patient about one hour ago. Answered questions regarding post Covid and vaccine administration at that time. Patient calls back now reporting after standing and walking to the kitchen he became dizzy and passed out, lost consciousness. Stated maybe for a few seconds. Hit his head on the wall as he was going down. No open area or knot that he could detect. B/P afterwards 146/68 no heart rate to report. Does not feel dizzy now. Instructed patient to be seen at [facility] or ED with his mask on. He will call one of his children to take him to [facility] in [place] at this time. Instructed patient to call back for follow-up care.

## 2019-09-14 NOTE — ED Triage Notes (Addendum)
Pt states he has been "woozy" all weekend after an eye doctor visit. Very dizzy. Reached up to get a blood pressure monitor out of the closet today and next thing he knew he woke up on the floor. Left knee pain and hit the side of his face Pt reports he feels like its an inner ear problem. Feels like vertigo

## 2019-09-14 NOTE — Telephone Encounter (Signed)
  This encounter was created in error by RN- please disregard.

## 2019-09-15 ENCOUNTER — Telehealth: Payer: Self-pay

## 2019-09-15 DIAGNOSIS — R42 Dizziness and giddiness: Secondary | ICD-10-CM | POA: Diagnosis not present

## 2019-09-15 DIAGNOSIS — R55 Syncope and collapse: Secondary | ICD-10-CM | POA: Diagnosis not present

## 2019-09-15 MED ORDER — ATORVASTATIN CALCIUM 20 MG PO TABS
10.00 | ORAL_TABLET | ORAL | Status: DC
Start: ? — End: 2019-09-15

## 2019-09-15 MED ORDER — ASPIRIN 81 MG PO CHEW
81.00 | CHEWABLE_TABLET | ORAL | Status: DC
Start: 2019-09-16 — End: 2019-09-15

## 2019-09-15 MED ORDER — FAMOTIDINE 20 MG PO TABS
10.00 | ORAL_TABLET | ORAL | Status: DC
Start: 2019-09-15 — End: 2019-09-15

## 2019-09-15 MED ORDER — GABAPENTIN 400 MG PO CAPS
400.00 | ORAL_CAPSULE | ORAL | Status: DC
Start: 2019-09-15 — End: 2019-09-15

## 2019-09-15 MED ORDER — ACETAMINOPHEN 500 MG PO TABS
1000.00 | ORAL_TABLET | ORAL | Status: DC
Start: ? — End: 2019-09-15

## 2019-09-15 MED ORDER — MONTELUKAST SODIUM 10 MG PO TABS
10.00 | ORAL_TABLET | ORAL | Status: DC
Start: 2019-09-15 — End: 2019-09-15

## 2019-09-15 MED ORDER — LORATADINE 10 MG PO TABS
10.00 | ORAL_TABLET | ORAL | Status: DC
Start: 2019-09-16 — End: 2019-09-15

## 2019-09-15 MED ORDER — TAMSULOSIN HCL 0.4 MG PO CAPS
0.40 | ORAL_CAPSULE | ORAL | Status: DC
Start: 2019-09-15 — End: 2019-09-15

## 2019-09-15 MED ORDER — PANTOPRAZOLE SODIUM 40 MG PO TBEC
40.00 | DELAYED_RELEASE_TABLET | ORAL | Status: DC
Start: 2019-09-16 — End: 2019-09-15

## 2019-09-15 NOTE — Telephone Encounter (Signed)
Copied from [place] [id number]. Topic: General - Other >> Sep 15, 2019 12:06 PM Antonieta Iba C wrote: Reason for CRM:  Mitsu with [facility] is calling in to schedule a hospital follow up with PCP for pt. Not showing any opening. Advised by office to send message for call back to schedule.   [id number] - Mitsu >> Sep 15, 2019 12:19 PM Myatt, Marland Kitchen wrote: Please advise

## 2019-09-16 NOTE — Telephone Encounter (Signed)
appt scheduled for 2.23.2021 with Dr Ancil Boozer for 10am

## 2019-09-21 DIAGNOSIS — I2510 Atherosclerotic heart disease of native coronary artery without angina pectoris: Secondary | ICD-10-CM | POA: Diagnosis not present

## 2019-09-21 DIAGNOSIS — H2511 Age-related nuclear cataract, right eye: Secondary | ICD-10-CM | POA: Diagnosis not present

## 2019-09-22 ENCOUNTER — Encounter: Payer: Self-pay | Admitting: Family Medicine

## 2019-09-22 ENCOUNTER — Other Ambulatory Visit: Payer: Self-pay

## 2019-09-22 ENCOUNTER — Encounter: Payer: Self-pay | Admitting: Ophthalmology

## 2019-09-22 ENCOUNTER — Ambulatory Visit (INDEPENDENT_AMBULATORY_CARE_PROVIDER_SITE_OTHER): Payer: Medicare HMO | Admitting: Family Medicine

## 2019-09-22 VITALS — BP 116/62

## 2019-09-22 DIAGNOSIS — I2510 Atherosclerotic heart disease of native coronary artery without angina pectoris: Secondary | ICD-10-CM | POA: Diagnosis not present

## 2019-09-22 DIAGNOSIS — Z09 Encounter for follow-up examination after completed treatment for conditions other than malignant neoplasm: Secondary | ICD-10-CM | POA: Diagnosis not present

## 2019-09-22 DIAGNOSIS — I771 Stricture of artery: Secondary | ICD-10-CM

## 2019-09-22 DIAGNOSIS — I6523 Occlusion and stenosis of bilateral carotid arteries: Secondary | ICD-10-CM | POA: Diagnosis not present

## 2019-09-22 DIAGNOSIS — R55 Syncope and collapse: Secondary | ICD-10-CM

## 2019-09-22 DIAGNOSIS — I10 Essential (primary) hypertension: Secondary | ICD-10-CM | POA: Diagnosis not present

## 2019-09-22 NOTE — Progress Notes (Signed)
Name: Mark [name]   MRN: [id number]    DOB: Jul 04, 1929   Date:09/22/2019       Progress Note  Subjective  Chief Complaint  Chief Complaint  Patient presents with  . Hospitalization Follow-up    He was dizzy and passed out when he was trying to check his blood pressure on 09/14/2019. He was evaluated at [facility] and then sent to [facility] ED. He was admitted and it was determined that his blood pressure medication was the cause. He is taking Metoprolol 12mg  in the am and 12mg  in the evening. Symptoms have resolved since, but he still does not think he has fully recovered. Cardiology wants to evaluate his stent.     I connected with  [name] on 09/22/19 at 10:00 AM EST by telephone and verified that I am speaking with the correct person using two identifiers.  I discussed the limitations, risks, security and privacy concerns of performing an evaluation and management service by telephone and the availability of in person appointments. Staff also discussed with the patient that there may be a patient responsible charge related to this service. Patient Location: at home  Provider Location: [facility] Additional Individuals present: nobody   HPI  Syncopal Episode: he was seen at [facility] and from there he was advised to go to [facility] , he went to  [facility] on 09/14/2019 after a syncopal episode. He was walking and felt dizzy, he went to get his bp monitor and reached up in the closet and woke up on the floor, seems like he feel forward towards his closet - had a contusion on knee from the fall. He was told it was secondary to bp medication and discharged after 24 hours , reviewed labs ( see below ), he states when he got home from [facility] his bp spiked to 172-178 and he resume metoprolol at 12.5 mg BID and bp is back to baseline. He has a history of carotid atherosclerosis and also had a stent placed in 2015 , he has a follow up with Dr. Nehemiah Massed this  afternoon for follow up. He states he does not feel like he is back to his normal self.  He has noticed some head fullness and still has intermittent dizziness when walking but no fall since he went home on 09/15/2019 No weakness or neuro deficit, normal appetite, no fever or chills.   Procedure Note - Interface, Rad Results In - 09/15/2019 11:10 AM EST  SINUS RHYTHM WITH OCCASIONAL PREMATURE VENTRICULAR BEATS AND PREMATURE ATRIAL BEATS LEFT ANTERIOR FASCICULAR BLOCK POSSIBLE INFERIOR INFARCT (CITED ON OR BEFORE 14-Sep-2019) ANTEROLATERAL INFARCT (CITED ON OR BEFORE 14-Sep-2019) ABNORMAL ECG WHEN COMPARED WITH ECG OF 14-Sep-2019 17:45, PREMATURE VENTRICULAR BEATS ARE NOW PRESENT PREMATURE ATRIAL BEATS ARE NOW PRESENT NONSPECIFIC T WAVE ABNORMALITY, WORSE IN LATERAL LEADS   TECHNIQUE: PA and Lateral Chest Radiographs.    FINDINGS:     Hazy bibasilar opacities, greater on the right.    No pleural effusion or pneumothorax.    Unremarkable cardiac silhouette with tortuous aorta.    Severe degenerative disc disease of the lumbar spine.   Positive for SARS-COV2- PCR - he was diagnosed beginning of Feb 08/2019 , symptoms was present since January 2021    Elevated White count   Urinalysis showed trace of blood    Patient Active Problem List   Diagnosis Date Noted  . Respiratory tract infection due to COVID-19 virus 09/04/2019  .  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 Senile purpura ([place]) 09/29/2018  . Atherosclerosis of aorta ([place]) 09/29/2018  . Abnormal ECG 06/16/2018  . Thoracic aortic aneurysm without rupture ([place]) 05/28/2018  . CHF (congestive heart failure), NYHA class III, chronic, systolic ([place]) [id number]  . 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 Ischemic cardiomyopathy 06/11/2017  . Benign essential HTN 05/22/2017  . Bilateral carotid artery stenosis 05/22/2017  . Neuropathy 01/28/2017  . Right leg paresthesias 11/26/2016  .  Hypertension 08/27/2016  . GERD (gastroesophageal reflux disease) 08/27/2016  . Hyperglycemia 05/10/2016  . History of nonmelanoma skin cancer 10/10/2015  . Pain in right shoulder 09/13/2015  . Osteoarthritis 07/06/2015  . Leg swelling 05/30/2015  . Other specified soft tissue disorders 05/30/2015  . Hyperlipidemia 03/29/2015  . Family history of type 2 diabetes mellitus in father 03/29/2015  . DD (diverticular disease) 02/03/2015  . Diverticulosis of intestine without perforation or abscess without bleeding 02/03/2015  . DDD (degenerative disc disease), lumbar 03/03/2014  . Intervertebral disc disorder with radiculopathy of lumbar region 03/03/2014  . Lumbar radiculitis 03/03/2014  . Lumbar stenosis with neurogenic claudication 03/03/2014  . Arteriosclerosis of coronary artery 04/18/2012  . S/P coronary artery stent placement 04/18/2012  . Status post percutaneous transluminal coronary angioplasty 04/18/2012  . Malignant melanoma of skin, unspecified ([place]) 04/18/2012    Past Surgical History:  Procedure Laterality Date  . CORONARY ANGIOPLASTY WITH STENT PLACEMENT  2005   [facility]    Family History  Problem Relation Age of Onset  . Hypertension Mother   . Heart disease Mother   . Heart disease Father   . Hypertension Father     Social History   Tobacco Use  . Smoking status: Former Smoker    Quit date: 05/16/1963    Years since quitting: 56.3  . Smokeless tobacco: Never Used  . Tobacco comment: quit over 50 years ago  Substance Use Topics  . Alcohol use: No    Current Outpatient Medications:  .  acetaminophen (TYLENOL) 500 MG tablet, Take 500 mg by mouth every 6 (six) hours as needed., Disp: , Rfl:  .  albuterol (VENTOLIN HFA) 108 (90 Base) MCG/ACT inhaler, Inhale 2 puffs into the lungs every 6 (six) hours as needed for wheezing or shortness of breath., Disp: 18 g, Rfl: 0 .  aspirin EC 81 MG tablet, Take 81 mg by mouth daily. , Disp: , Rfl:  .  atorvastatin (LIPITOR)  10 MG tablet, Take 1 tablet (10 mg total) by mouth daily., Disp: 90 tablet, Rfl: 0 .  gabapentin (NEURONTIN) 400 MG capsule, Take 1 capsule (400 mg total) by mouth 3 (three) times daily., Disp: 180 capsule, Rfl: 1 .  Loratadine 10 MG CAPS, Take 10 mg by mouth daily. , Disp: , Rfl:  .  metoprolol tartrate (LOPRESSOR) 25 MG tablet, Take 0.5 tablets (12.5 mg total) by mouth daily., Disp: 1 tablet, Rfl: 0 .  montelukast (SINGULAIR) 10 MG tablet, Take 1 tablet (10 mg total) by mouth at bedtime., Disp: 90 tablet, Rfl: 1 .  Oxymetazoline HCl (NASAL SPRAY) 0.05 % SOLN, 1 spray by Each Nare route Two (2) times a day. 1 spray per nare., Disp: , Rfl:  .  pantoprazole (PROTONIX) 40 MG tablet, Take 1 tablet (40 mg total) by mouth daily., Disp: 90 tablet, Rfl: 0 .  Probiotic Product (PROBIOTIC PO), Take by mouth daily. , Disp: , Rfl:   Allergies  Allergen Reactions  . Breo Ellipta [Fluticasone Furoate-Vilanterol] Rash  Patient stated that it made his mouth break out  . Rosuvastatin Rash and Other (See Comments)    No reaction per pt, He saw some information regarding Crestor and adverse effects No reaction per pt, He saw some information regarding Crestor and adverse effects No reaction per pt, He saw some information regarding Crestor and adverse effects No reaction per pt, He saw some information regarding Crestor and adverse effects No reaction per pt, He saw some information regarding Crestor and adverse effects    I personally reviewed active problem list, medication list, allergies, family history, social history, health maintenance with the patient/caregiver today.   ROS  Ten systems reviewed and is negative except as mentioned in HPI   Objective  Virtual encounter, vitals not obtained.  There is no height or weight on file to calculate BMI.  Physical Exam  Awake, alert and oriented  PHQ2/9: Depression screen [facility] 2/9 09/22/2019 09/03/2019 08/14/2019 02/11/2019 09/29/2018  Decreased  Interest 0 0 0 0 0  Down, Depressed, Hopeless 0 0 0 0 0  PHQ - 2 Score 0 0 0 0 0  Altered sleeping 0 0 0 0 0  Tired, decreased energy 0 0 1 0 0  Change in appetite 0 0 0 0 0  Feeling bad or failure about yourself  0 0 0 0 0  Trouble concentrating 0 0 0 0 0  Moving slowly or fidgety/restless 0 0 0 0 0  Suicidal thoughts 0 0 0 0 0  PHQ-9 Score 0 0 1 0 0  Difficult doing work/chores - Not difficult at all Not difficult at all - -  Some recent data might be hidden   PHQ-2/9 Result is negative.    Fall Risk: Fall Risk  09/22/2019 09/03/2019 08/14/2019 02/11/2019 09/29/2018  Falls in the past year? 1 0 1 0 0  Number falls in past yr: 0 0 0 0 0  Injury with Fall? 1 0 0 0 0    Assessment & Plan  1. Syncope, unspecified syncope type  Discussed getting a CT brain, since he may have had a mini stroke or stroke, but he wants to see Dr. Scarlette Ar first   2. Hospital discharge follow-up  Reviewed records, discussed CT brain  3. Tortuous aorta (HCC)  On statin therapy and aspirin daily   4. Bilateral carotid artery stenosis  Keep follow up with Dr. Nehemiah Massed   I discussed the assessment and treatment plan with the patient. The patient was provided an opportunity to ask questions and all were answered. The patient agreed with the plan and demonstrated an understanding of the instructions.   The patient was advised to call back or seek an in-person evaluation if the symptoms worsen or if the condition fails to improve as anticipated.  I provided 25  minutes of non-face-to-face time during this encounter.  Loistine Chance, MD

## 2019-09-23 ENCOUNTER — Encounter: Payer: Self-pay | Admitting: Anesthesiology

## 2019-09-28 ENCOUNTER — Other Ambulatory Visit: Payer: Self-pay | Admitting: Family Medicine

## 2019-09-28 DIAGNOSIS — K219 Gastro-esophageal reflux disease without esophagitis: Secondary | ICD-10-CM

## 2019-09-28 DIAGNOSIS — E7800 Pure hypercholesterolemia, unspecified: Secondary | ICD-10-CM

## 2019-09-29 ENCOUNTER — Ambulatory Visit: Admission: RE | Admit: 2019-09-29 | Payer: Medicare HMO | Source: Home / Self Care | Admitting: Ophthalmology

## 2019-09-29 HISTORY — DX: Gastro-esophageal reflux disease without esophagitis: K21.9

## 2019-09-29 SURGERY — PHACOEMULSIFICATION, CATARACT, WITH IOL INSERTION
Anesthesia: Topical | Laterality: Right

## 2019-10-12 ENCOUNTER — Other Ambulatory Visit: Payer: Self-pay | Admitting: Gastroenterology

## 2019-10-12 DIAGNOSIS — I2510 Atherosclerotic heart disease of native coronary artery without angina pectoris: Secondary | ICD-10-CM | POA: Diagnosis not present

## 2019-10-12 DIAGNOSIS — M5136 Other intervertebral disc degeneration, lumbar region: Secondary | ICD-10-CM | POA: Diagnosis not present

## 2019-10-12 DIAGNOSIS — R1314 Dysphagia, pharyngoesophageal phase: Secondary | ICD-10-CM | POA: Diagnosis not present

## 2019-10-12 DIAGNOSIS — K219 Gastro-esophageal reflux disease without esophagitis: Secondary | ICD-10-CM | POA: Diagnosis not present

## 2019-10-12 DIAGNOSIS — I10 Essential (primary) hypertension: Secondary | ICD-10-CM | POA: Diagnosis not present

## 2019-10-12 DIAGNOSIS — Z955 Presence of coronary angioplasty implant and graft: Secondary | ICD-10-CM | POA: Diagnosis not present

## 2019-10-13 DIAGNOSIS — I10 Essential (primary) hypertension: Secondary | ICD-10-CM | POA: Diagnosis not present

## 2019-10-13 DIAGNOSIS — I255 Ischemic cardiomyopathy: Secondary | ICD-10-CM | POA: Diagnosis not present

## 2019-10-13 DIAGNOSIS — I2510 Atherosclerotic heart disease of native coronary artery without angina pectoris: Secondary | ICD-10-CM | POA: Diagnosis not present

## 2019-10-13 DIAGNOSIS — I6523 Occlusion and stenosis of bilateral carotid arteries: Secondary | ICD-10-CM | POA: Diagnosis not present

## 2019-10-13 DIAGNOSIS — R55 Syncope and collapse: Secondary | ICD-10-CM | POA: Diagnosis not present

## 2019-10-15 ENCOUNTER — Ambulatory Visit
Admission: RE | Admit: 2019-10-15 | Discharge: 2019-10-15 | Disposition: A | Discharge status: Home / Self Care | Payer: Medicare HMO | Source: Ambulatory Visit | Attending: Gastroenterology | Admitting: Gastroenterology

## 2019-10-15 ENCOUNTER — Other Ambulatory Visit: Payer: Self-pay

## 2019-10-15 DIAGNOSIS — R1314 Dysphagia, pharyngoesophageal phase: Secondary | ICD-10-CM

## 2019-10-15 DIAGNOSIS — K449 Diaphragmatic hernia without obstruction or gangrene: Secondary | ICD-10-CM | POA: Diagnosis not present

## 2019-10-26 ENCOUNTER — Other Ambulatory Visit: Payer: Self-pay

## 2019-10-26 ENCOUNTER — Ambulatory Visit: Payer: Medicare HMO | Attending: Internal Medicine

## 2019-10-26 DIAGNOSIS — Z23 Encounter for immunization: Secondary | ICD-10-CM

## 2019-10-26 NOTE — Progress Notes (Signed)
   Covid-19 Vaccination Clinic  Name:  Mark [name]    MRN: [id number] DOB: 1928-11-21  10/26/2019  [name] was observed post Covid-19 immunization for 15 minutes without incident. He was provided with Vaccine Information Sheet and instruction to access the V-Safe system.   [name] was instructed to call 911 with any severe reactions post vaccine: Marland Kitchen Difficulty breathing  . Swelling of face and throat  . A fast heartbeat  . A bad rash all over body  . Dizziness and weakness   Immunizations Administered    Name Date Dose VIS Date Route   Pfizer COVID-19 Vaccine 10/26/2019  2:27 PM 0.3 mL 07/10/2019 Intramuscular   Manufacturer: [place]   Lot: [id number]   [name]: [id number]

## 2019-11-16 ENCOUNTER — Ambulatory Visit: Payer: Medicare HMO | Admitting: Dermatology

## 2019-11-16 ENCOUNTER — Other Ambulatory Visit: Payer: Self-pay

## 2019-11-16 DIAGNOSIS — L820 Inflamed seborrheic keratosis: Secondary | ICD-10-CM

## 2019-11-16 DIAGNOSIS — L578 Other skin changes due to chronic exposure to nonionizing radiation: Secondary | ICD-10-CM | POA: Diagnosis not present

## 2019-11-16 DIAGNOSIS — L570 Actinic keratosis: Secondary | ICD-10-CM

## 2019-11-16 DIAGNOSIS — D692 Other nonthrombocytopenic purpura: Secondary | ICD-10-CM

## 2019-11-16 DIAGNOSIS — L821 Other seborrheic keratosis: Secondary | ICD-10-CM

## 2019-11-16 DIAGNOSIS — L814 Other melanin hyperpigmentation: Secondary | ICD-10-CM

## 2019-11-16 NOTE — Progress Notes (Signed)
   Follow-Up Visit   Subjective  Mark [name] is a 86 y.o. male who presents for the following: recheck AK's (scalp, face - patient has noticed similar lesions on the scalp still) and recheck ISK's (scalp, face, and arms - patient has noticed similar lesions on the arms and scalp still).  The following portions of the chart were reviewed this encounter and updated as appropriate: Tobacco  Allergies  Meds  Problems  Med Hx  Surg Hx  Fam Hx     Review of Systems: No other skin or systemic complaints.  Objective  Well appearing patient in no apparent distress; mood and affect are within normal limits.  A focused examination was performed including face, scalp, arms, and hands. Relevant physical exam findings are noted in the Assessment and Plan.  Objective  Face, scalp, and ears x 26 (26): Erythematous thin papules/macules with gritty scale.   Objective  arms and hands x 23 (23): Erythematous keratotic or waxy stuck-on papule or plaque.   Assessment & Plan  AK (actinic keratosis) (26) Face, scalp, and ears x 26  Discussed PDT - patient didn't see an improvement with the last treatment he had. We also discussed topical field therapy with chemotherapy creams but he is not interested in starting those today either. Will recheck AK's at follow up appointment in two months.  Destruction of lesion - Face, scalp, and ears x 26 Complexity: simple   Destruction method: cryotherapy   Informed consent: discussed and consent obtained   Timeout:  patient name, date of birth, surgical site, and procedure verified Lesion destroyed using liquid nitrogen: Yes   Region frozen until ice ball extended beyond lesion: Yes   Outcome: patient tolerated procedure well with no complications   Post-procedure details: wound care instructions given    Inflamed seborrheic keratosis (23) arms and hands x 23  Destruction of lesion - arms and hands x 23 Complexity: simple   Destruction method:  cryotherapy   Informed consent: discussed and consent obtained   Timeout:  patient name, date of birth, surgical site, and procedure verified Lesion destroyed using liquid nitrogen: Yes   Region frozen until ice ball extended beyond lesion: Yes   Outcome: patient tolerated procedure well with no complications   Post-procedure details: wound care instructions given     Actinic Damage - diffuse scaly erythematous macules with underlying dyspigmentation - Recommend daily broad spectrum sunscreen SPF 30+ to sun-exposed areas, reapply every 2 hours as needed.  - Call for new or changing lesions. Discussed field tx for AKs  Seborrheic Keratoses - Stuck-on, waxy, tan-brown papules and plaques  - Discussed benign etiology and prognosis. - Observe - Call for any changes  Lentigines - Scattered tan macules - Discussed due to sun exposure - Benign, observe - Call for any changes  Purpura - Violaceous macules and patches - Benign - Related to age, sun damage and/or use of blood thinners - Observe - Can use OTC arnica containing moisturizer such as Dermend Bruise Formula if desired - Call for worsening or other concerns  Return in about 2 months (around 01/16/2020) for recheck AK's and ISK's .   Luther Redo, CMA, am acting as scribe for Sarina Ser, MD .  Documentation: I have reviewed the above documentation for accuracy and completeness, and I agree with the above.  Sarina Ser, MD

## 2019-11-17 ENCOUNTER — Encounter: Payer: Self-pay | Admitting: Ophthalmology

## 2019-11-17 ENCOUNTER — Other Ambulatory Visit: Payer: Self-pay

## 2019-11-17 ENCOUNTER — Encounter: Payer: Self-pay | Admitting: Dermatology

## 2019-11-17 ENCOUNTER — Ambulatory Visit (INDEPENDENT_AMBULATORY_CARE_PROVIDER_SITE_OTHER): Payer: Medicare HMO

## 2019-11-17 DIAGNOSIS — Z0000 Encounter for general adult medical examination without abnormal findings: Secondary | ICD-10-CM

## 2019-11-17 NOTE — Patient Instructions (Signed)
Mark [name] , Thank you for taking time to come for your Medicare Wellness Visit. I appreciate your ongoing commitment to your health goals. Please review the following plan we discussed and let me know if I can assist you in the future.   Screening recommendations/referrals: Colonoscopy: no longer required Recommended yearly ophthalmology/optometry visit for glaucoma screening and checkup Recommended yearly dental visit for hygiene and checkup  Vaccinations: Influenza vaccine: done 08/17/19 Pneumococcal vaccine: done 11/21/17 Tdap vaccine: done 07/28/17 Shingles vaccine: Shingrix discussed. Please contact your pharmacy for coverage information.  Covid-19: first dose 10/26/19  Advanced directives: Please bring a copy of your health care power of attorney and living will to the office at your convenience.  Conditions/risks identified: Recommend drinking 6-8 glasses of water per day  Next appointment: Please follow up in one year for your Medicare Annual Wellness visit.    Preventive Care 84 Years and Older, Male Preventive care refers to lifestyle choices and visits with your health care provider that can promote health and wellness. What does preventive care include?  A yearly physical exam. This is also called an annual well check.  Dental exams once or twice a year.  Routine eye exams. Ask your health care provider how often you should have your eyes checked.  Personal lifestyle choices, including:  Daily care of your teeth and gums.  Regular physical activity.  Eating a healthy diet.  Avoiding tobacco and drug use.  Limiting alcohol use.  Practicing safe sex.  Taking low doses of aspirin every day.  Taking vitamin and mineral supplements as recommended by your health care provider. What happens during an annual well check? The services and screenings done by your health care provider during your annual well check will depend on your age, overall health, lifestyle risk  factors, and family history of disease. Counseling  Your health care provider may ask you questions about your:  Alcohol use.  Tobacco use.  Drug use.  Emotional well-being.  Home and relationship well-being.  Sexual activity.  Eating habits.  History of falls.  Memory and ability to understand (cognition).  Work and work Statistician. Screening  You may have the following tests or measurements:  Height, weight, and BMI.  Blood pressure.  Lipid and cholesterol levels. These may be checked every 5 years, or more frequently if you are over 84 years old.  Skin check.  Lung cancer screening. You may have this screening every year starting at age 20 if you have a 30-pack-year history of smoking and currently smoke or have quit within the past 15 years.  Fecal occult blood test (FOBT) of the stool. You may have this test every year starting at age 5.  Flexible sigmoidoscopy or colonoscopy. You may have a sigmoidoscopy every 5 years or a colonoscopy every 10 years starting at age 21.  Prostate cancer screening. Recommendations will vary depending on your family history and other risks.  Hepatitis C blood test.  Hepatitis B blood test.  Sexually transmitted disease (STD) testing.  Diabetes screening. This is done by checking your blood sugar (glucose) after you have not eaten for a while (fasting). You may have this done every 1-3 years.  Abdominal aortic aneurysm (AAA) screening. You may need this if you are a current or former smoker.  Osteoporosis. You may be screened starting at age 38 if you are at high risk. Talk with your health care provider about your test results, treatment options, and if necessary, the need for more tests. Vaccines  Your health care provider may recommend certain vaccines, such as:  Influenza vaccine. This is recommended every year.  Tetanus, diphtheria, and acellular pertussis (Tdap, Td) vaccine. You may need a Td booster every 10  years.  Zoster vaccine. You may need this after age 65.  Pneumococcal 13-valent conjugate (PCV13) vaccine. One dose is recommended after age 14.  Pneumococcal polysaccharide (PPSV23) vaccine. One dose is recommended after age 45. Talk to your health care provider about which screenings and vaccines you need and how often you need them. This information is not intended to replace advice given to you by your health care provider. Make sure you discuss any questions you have with your health care provider. Document Released: 08/12/2015 Document Revised: 04/04/2016 Document Reviewed: 05/17/2015 Elsevier Interactive Patient Education  2017 [place] Prevention in the Home Falls can cause injuries. They can happen to people of all ages. There are many things you can do to make your home safe and to help prevent falls. What can I do on the outside of my home?  Regularly fix the edges of walkways and driveways and fix any cracks.  Remove anything that might make you trip as you walk through a door, such as a raised step or threshold.  Trim any bushes or trees on the path to your home.  Use bright outdoor lighting.  Clear any walking paths of anything that might make someone trip, such as rocks or tools.  Regularly check to see if handrails are loose or broken. Make sure that both sides of any steps have handrails.  Any raised decks and porches should have guardrails on the edges.  Have any leaves, snow, or ice cleared regularly.  Use sand or salt on walking paths during winter.  Clean up any spills in your garage right away. This includes oil or grease spills. What can I do in the bathroom?  Use night lights.  Install grab bars by the toilet and in the tub and shower. Do not use towel bars as grab bars.  Use non-skid mats or decals in the tub or shower.  If you need to sit down in the shower, use a plastic, non-slip stool.  Keep the floor dry. Clean up any water that  spills on the floor as soon as it happens.  Remove soap buildup in the tub or shower regularly.  Attach bath mats securely with double-sided non-slip rug tape.  Do not have throw rugs and other things on the floor that can make you trip. What can I do in the bedroom?  Use night lights.  Make sure that you have a light by your bed that is easy to reach.  Do not use any sheets or blankets that are too big for your bed. They should not hang down onto the floor.  Have a firm chair that has side arms. You can use this for support while you get dressed.  Do not have throw rugs and other things on the floor that can make you trip. What can I do in the kitchen?  Clean up any spills right away.  Avoid walking on wet floors.  Keep items that you use a lot in easy-to-reach places.  If you need to reach something above you, use a strong step stool that has a grab bar.  Keep electrical cords out of the way.  Do not use floor polish or wax that makes floors slippery. If you must use wax, use non-skid floor wax.  Do  not have throw rugs and other things on the floor that can make you trip. What can I do with my stairs?  Do not leave any items on the stairs.  Make sure that there are handrails on both sides of the stairs and use them. Fix handrails that are broken or loose. Make sure that handrails are as long as the stairways.  Check any carpeting to make sure that it is firmly attached to the stairs. Fix any carpet that is loose or worn.  Avoid having throw rugs at the top or bottom of the stairs. If you do have throw rugs, attach them to the floor with carpet tape.  Make sure that you have a light switch at the top of the stairs and the bottom of the stairs. If you do not have them, ask someone to add them for you. What else can I do to help prevent falls?  Wear shoes that:  Do not have high heels.  Have rubber bottoms.  Are comfortable and fit you well.  Are closed at the  toe. Do not wear sandals.  If you use a stepladder:  Make sure that it is fully opened. Do not climb a closed stepladder.  Make sure that both sides of the stepladder are locked into place.  Ask someone to hold it for you, if possible.  Clearly mark and make sure that you can see:  Any grab bars or handrails.  First and last steps.  Where the edge of each step is.  Use tools that help you move around (mobility aids) if they are needed. These include:  Canes.  Walkers.  Scooters.  Crutches.  Turn on the lights when you go into a dark area. Replace any light bulbs as soon as they burn out.  Set up your furniture so you have a clear path. Avoid moving your furniture around.  If any of your floors are uneven, fix them.  If there are any pets around you, be aware of where they are.  Review your medicines with your doctor. Some medicines can make you feel dizzy. This can increase your chance of falling. Ask your doctor what other things that you can do to help prevent falls. This information is not intended to replace advice given to you by your health care provider. Make sure you discuss any questions you have with your health care provider. Document Released: 05/12/2009 Document Revised: 12/22/2015 Document Reviewed: 08/20/2014 Elsevier Interactive Patient Education  2017 [employer].

## 2019-11-17 NOTE — Progress Notes (Signed)
Subjective:   HONEST STARE is a 86 y.o. male who presents for an Initial Medicare Annual Wellness Visit.  Virtual Visit via Telephone Note  I connected with [name] on 11/17/19 at  8:00 AM EDT by telephone and verified that I am speaking with the correct person using two identifiers.  Medicare Annual Wellness visit completed telephonically due to Covid-19 pandemic.   Location: Patient: home Provider: office   I discussed the limitations, risks, security and privacy concerns of performing an evaluation and management service by telephone and the availability of in person appointments. The patient expressed understanding and agreed to proceed.  Some vital signs may be absent or patient reported.   Clemetine Marker, LPN    Review of Systems   Cardiac Risk Factors include: advanced age (>3men, >91 women);hypertension;male gender;dyslipidemia    Objective:    There were no vitals filed for this visit. There is no height or weight on file to calculate BMI.  Advanced Directives 11/17/2019 09/14/2019 05/21/2017 04/15/2017 02/26/2017 02/14/2017 11/26/2016  Does Patient Have a Medical Advance Directive? Yes Yes Yes Yes Yes Yes Yes  Type of Paramedic of [place];Living will [place];Living will Living will;Healthcare Power of [place];Living will [place];Living will [place];Living will [place];Living will  Does patient want to make changes to medical advance directive? - - - - - - -  Copy of [place] in Chart? No - copy requested - - No - copy requested - - -  Would patient like information on creating a medical advance directive? - - - - - - -    Current Medications (verified) Outpatient Encounter Medications as of 11/17/2019  Medication Sig  . acetaminophen (TYLENOL) 500 MG tablet Take 500 mg by mouth every 6 (six) hours as  needed.  Marland Kitchen albuterol (VENTOLIN HFA) 108 (90 Base) MCG/ACT inhaler Inhale 2 puffs into the lungs every 6 (six) hours as needed for wheezing or shortness of breath.  Marland Kitchen aspirin EC 81 MG tablet Take 81 mg by mouth daily.   Marland Kitchen atorvastatin (LIPITOR) 10 MG tablet TAKE 1 TABLET EVERY DAY  . gabapentin (NEURONTIN) 400 MG capsule Take 1 capsule (400 mg total) by mouth 3 (three) times daily. (Patient taking differently: Take 400 mg by mouth 3 (three) times daily. Pt taking twice daily)  . Loratadine 10 MG CAPS Take 10 mg by mouth daily.   . montelukast (SINGULAIR) 10 MG tablet Take 1 tablet (10 mg total) by mouth at bedtime.  . pantoprazole (PROTONIX) 40 MG tablet TAKE 1 TABLET EVERY DAY  . Probiotic Product (PROBIOTIC PO) Take by mouth daily.   . tamsulosin (FLOMAX) 0.4 MG CAPS capsule Take 0.4 mg by mouth.  . metoprolol tartrate (LOPRESSOR) 25 MG tablet Take 0.5 tablets (12.5 mg total) by mouth daily. (Patient not taking: Reported on 11/17/2019)   No facility-administered encounter medications on file as of 11/17/2019.    Allergies (verified) Breo ellipta [fluticasone furoate-vilanterol] and Rosuvastatin   History: Past Medical History:  Diagnosis Date  . Arthritis   . Carotid artery disease ([place])   . Coronary artery disease 2005  . COVID-19 09/01/2019   symptoms resolved 09/05/19  . GERD (gastroesophageal reflux disease)   . Hyperkalemia   . Hyperlipidemia   . Hypertension   . Past heart attack 2005   Past Surgical History:  Procedure Laterality Date  . CORONARY ANGIOPLASTY WITH STENT  PLACEMENT  2005   [facility]   Family History  Problem Relation Age of Onset  . Hypertension Mother   . Heart disease Mother   . Heart disease Father   . Hypertension Father    Social History   Socioeconomic History  . Marital status: Widowed    Spouse name: Not on file  . Number of children: 3  . Years of education: Not on file  . Highest education level: 10th grade  Occupational History  .  Occupation: retired   Tobacco Use  . Smoking status: Former Smoker    Quit date: 05/16/1963    Years since quitting: 56.5  . Smokeless tobacco: Never Used  . Tobacco comment: quit over 50 years ago  Substance and Sexual Activity  . Alcohol use: No  . Drug use: No  . Sexual activity: Not Currently    Partners: Female  Other Topics Concern  . Not on file  Social History Narrative  . Not on file   Social Determinants of Health   Financial Resource Strain: Low Risk   . Difficulty of Paying Living Expenses: Not hard at all  Food Insecurity: No Food Insecurity  . Worried About Charity fundraiser in the Last Year: Never true  . Ran Out of Food in the Last Year: Never true  Transportation Needs: No Transportation Needs  . Lack of Transportation (Medical): No  . Lack of Transportation (Non-Medical): No  Physical Activity: Sufficiently Active  . Days of Exercise per Week: 3 days  . Minutes of Exercise per Session: 60 min  Stress: No Stress Concern Present  . Feeling of Stress : Not at all  Social Connections: Somewhat Isolated  . Frequency of Communication with Friends and Family: More than three times a week  . Frequency of Social Gatherings with Friends and Family: Twice a week  . Attends Religious Services: More than 4 times per year  . Active Member of Clubs or Organizations: No  . Attends Archivist Meetings: Never  . Marital Status: Widowed   Tobacco Counseling Counseling given: Not Answered Comment: quit over 50 years ago   Clinical Intake:  Pre-visit preparation completed: Yes  Pain : No/denies pain     Nutritional Risks: None Diabetes: No  How often do you need to have someone help you when you read instructions, pamphlets, or other written materials from your doctor or pharmacy?: 1 - Never  Interpreter Needed?: No  Information entered by :: Clemetine Marker LPN  Activities of Daily Living In your present state of health, do you have any difficulty  performing the following activities: 11/17/2019 09/22/2019  Hearing? N N  Comment declines hearing aids -  Vision? N N  Difficulty concentrating or making decisions? N N  Walking or climbing stairs? N N  Dressing or bathing? N N  Doing errands, shopping? N N  Preparing Food and eating ? N -  Using the Toilet? N -  In the past six months, have you accidently leaked urine? N -  Do you have problems with loss of bowel control? N -  Managing your Medications? N -  Managing your Finances? N -  Housekeeping or managing your Housekeeping? N -  Some recent data might be hidden     Immunizations and Health Maintenance Immunization History  Administered Date(s) Administered  . Fluad Quad(high Dose 65+) 08/17/2019  . Influenza, High Dose Seasonal PF 03/29/2015, 06/15/2016, 05/28/2018  . Influenza-Unspecified 05/23/2017, 10/23/2018  . PFIZER SARS-COV-2 Vaccination 10/26/2019  .  Pneumococcal Conjugate-13 06/11/2016  . Pneumococcal Polysaccharide-23 11/21/2017  . Tdap 07/28/2017  . Zoster 05/07/2007   Health Maintenance Due  Topic Date Due  . COVID-19 Vaccine (2 - Pfizer 2-dose series) 11/16/2019    Patient Care Team: Steele Sizer, MD as PCP - General (Family Medicine) Jeni Salles, MD as Consulting Physician (Ophthalmology) Dasher, Rayvon Char, MD as Consulting Physician (Dermatology) Delana Meyer, Dolores Lory, MD as Consulting Physician (Vascular Surgery) Corey Skains, MD as Consulting Physician (Cardiology)  Indicate any recent Medical Services you may have received from other than [facility] providers in the past year (date may be approximate).    Assessment:   This is a routine wellness examination for [place].  Hearing/Vision screen  Hearing Screening   125Hz  250Hz  500Hz  1000Hz  2000Hz  3000Hz  4000Hz  6000Hz  8000Hz   Right ear:           Left ear:           Comments: Pt denies hearing difficulty   Vision Screening Comments: Annual vision screenings with [facility]  Dietary  issues and exercise activities discussed: Current Exercise Habits: Home exercise routine, Type of exercise: Other - see comments(yard work, cutting wood), Time (Minutes): 60, Frequency (Times/Week): 3, Weekly Exercise (Minutes/Week): 180, Intensity: Moderate, Exercise limited by: neurologic condition(s);cardiac condition(s)  Goals    . Increase water intake     Starting 06/11/16, I will increase my water intake to 5-6 glasses a day.      Depression Screen PHQ 2/9 Scores 11/17/2019 09/22/2019 09/03/2019 08/14/2019  PHQ - 2 Score 0 0 0 0  PHQ- 9 Score - 0 0 1    Fall Risk Fall Risk  11/17/2019 09/22/2019 09/03/2019 08/14/2019 02/11/2019  Falls in the past year? 1 1 0 1 0  Number falls in past yr: 1 0 0 0 0  Injury with Fall? 1 1 0 0 0  Risk for fall due to : History of fall(s) - - - -  Follow up Falls prevention discussed - - - -    FALL RISK PREVENTION PERTAINING TO THE HOME:  Any stairs in or around the home? No  If so, do they handrails? No   Home free of loose throw rugs in walkways, pet beds, electrical cords, etc? Yes  Adequate lighting in your home to reduce risk of falls? Yes   ASSISTIVE DEVICES UTILIZED TO PREVENT FALLS:  Life alert? No  Use of a cane, walker or w/c? No  Grab bars in the bathroom? No  Shower chair or bench in shower? Yes  Elevated toilet seat or a handicapped toilet? No   DME ORDERS:  DME order needed?  No   TIMED UP AND GO:  Was the test performed? No . Telephonic visit.   Education: Fall risk prevention has been discussed.  Intervention(s) required? No   Cognitive Function:     6CIT Screen 11/17/2019 06/11/2016  What Year? 0 points 0 points  What month? 0 points 0 points  What time? 0 points 0 points  Count back from 20 0 points 0 points  Months in reverse 0 points 0 points  Repeat phrase 0 points 2 points  Total Score 0 2    Screening Tests Health Maintenance  Topic Date Due  . COVID-19 Vaccine (2 - Pfizer 2-dose series) 11/16/2019  .  INFLUENZA VACCINE  02/28/2020  . TETANUS/TDAP  07/29/2027  . PNA vac Low Risk Adult  Completed    Qualifies for Shingles Vaccine? Yes  . Due for Shingrix. Education  has been provided regarding the importance of this vaccine. Pt has been advised to call insurance company to determine out of pocket expense. Advised may also receive vaccine at local pharmacy or Health Dept. Verbalized acceptance and understanding.  Tdap: Up to date  Flu Vaccine: Up to date  Pneumococcal Vaccine: Up to date   Cancer Screenings:  Colorectal Screening: No longer required.   Lung Cancer Screening: (Low Dose CT Chest recommended if Age 20-80 years, 30 pack-year currently smoking OR have quit w/in 15years.) does not qualify.   Additional Screening:  Hepatitis C Screening: no longer activity  Vision Screening: Recommended annual ophthalmology exams for early detection of glaucoma and other disorders of the eye. Is the patient up to date with their annual eye exam?  Yes  Who is the provider or what is the name of the office in which the pt attends annual eye exams? [place] Screening: Recommended annual dental exams for proper oral hygiene  Community Resource Referral:  CRR required this visit?  No       Plan:    I have personally reviewed and addressed the Medicare Annual Wellness questionnaire and have noted the following in the patient's chart:  A. Medical and social history B. Use of alcohol, tobacco or illicit drugs  C. Current medications and supplements D. Functional ability and status E.  Nutritional status F.  Physical activity G. Advance directives H. List of other physicians I.  Hospitalizations, surgeries, and ER visits in previous 12 months J.  [place] such as hearing and vision if needed, cognitive and depression L. Referrals and appointments   In addition, I have reviewed and discussed with patient certain preventive protocols, quality metrics,  and best practice recommendations. A written personalized care plan for preventive services as well as general preventive health recommendations were provided to patient.   Signed,  Clemetine Marker, LPN Nurse Health Advisor   Nurse Notes: pt c/o occasional swelling in RLE, mostly ankle. Pt denies pain other than neuropathic pain, taking gabapentin.

## 2019-11-18 ENCOUNTER — Ambulatory Visit: Payer: Medicare HMO | Attending: Internal Medicine

## 2019-11-18 DIAGNOSIS — Z23 Encounter for immunization: Secondary | ICD-10-CM

## 2019-11-18 NOTE — Progress Notes (Signed)
   Covid-19 Vaccination Clinic  Name:  VEDDER DICKERT    MRN: [id number] DOB: 1928/12/20  11/18/2019  Mr. Stordahl was observed post Covid-19 immunization for 15 minutes without incident. He was provided with Vaccine Information Sheet and instruction to access the V-Safe system.   Mr. Torstenson was instructed to call 911 with any severe reactions post vaccine: Marland Kitchen Difficulty breathing  . Swelling of face and throat  . A fast heartbeat  . A bad rash all over body  . Dizziness and weakness   Immunizations Administered    Name Date Dose VIS Date Route   Pfizer COVID-19 Vaccine 11/18/2019  2:27 PM 0.3 mL 09/23/2018 Intramuscular   Manufacturer: [employer], [employer]   Lot: [id number]   [place]: [id number]

## 2019-11-19 ENCOUNTER — Other Ambulatory Visit: Payer: Self-pay

## 2019-11-19 MED ORDER — TAMSULOSIN HCL 0.4 MG PO CAPS: 0.4000 mg | ORAL_CAPSULE | Freq: Every day | ORAL | 12 refills | Status: DC

## 2019-11-19 MED ORDER — TAMSULOSIN HCL 0.4 MG PO CAPS: 0.4000 mg | ORAL_CAPSULE | Freq: Every day | ORAL | 3 refills | Status: DC

## 2019-11-19 NOTE — Addendum Note (Signed)
Addended by: Steele Sizer F on: 11/19/2019 02:27 PM   Modules accepted: Orders

## 2019-11-20 NOTE — Discharge Instructions (Signed)

## 2019-11-23 ENCOUNTER — Other Ambulatory Visit: Payer: Self-pay

## 2019-11-24 ENCOUNTER — Encounter: Payer: Self-pay | Admitting: Ophthalmology

## 2019-11-24 ENCOUNTER — Ambulatory Visit: Payer: Medicare HMO | Admitting: Anesthesiology

## 2019-11-24 ENCOUNTER — Encounter
Admission: RE | Disposition: A | Discharge status: Home / Self Care | Payer: Self-pay | Source: Home / Self Care | Attending: Ophthalmology

## 2019-11-24 ENCOUNTER — Ambulatory Visit
Admission: RE | Admit: 2019-11-24 | Discharge: 2019-11-24 | Disposition: A | Discharge status: Home / Self Care | Payer: Medicare HMO | Attending: Ophthalmology | Admitting: Ophthalmology

## 2019-11-24 ENCOUNTER — Other Ambulatory Visit: Payer: Self-pay

## 2019-11-24 DIAGNOSIS — N400 Enlarged prostate without lower urinary tract symptoms: Secondary | ICD-10-CM | POA: Insufficient documentation

## 2019-11-24 DIAGNOSIS — H2511 Age-related nuclear cataract, right eye: Secondary | ICD-10-CM | POA: Insufficient documentation

## 2019-11-24 DIAGNOSIS — Z955 Presence of coronary angioplasty implant and graft: Secondary | ICD-10-CM | POA: Diagnosis not present

## 2019-11-24 DIAGNOSIS — H25811 Combined forms of age-related cataract, right eye: Secondary | ICD-10-CM | POA: Diagnosis not present

## 2019-11-24 DIAGNOSIS — I509 Heart failure, unspecified: Secondary | ICD-10-CM | POA: Diagnosis not present

## 2019-11-24 DIAGNOSIS — M5430 Sciatica, unspecified side: Secondary | ICD-10-CM | POA: Diagnosis not present

## 2019-11-24 DIAGNOSIS — I2510 Atherosclerotic heart disease of native coronary artery without angina pectoris: Secondary | ICD-10-CM | POA: Diagnosis not present

## 2019-11-24 DIAGNOSIS — I444 Left anterior fascicular block: Secondary | ICD-10-CM | POA: Diagnosis not present

## 2019-11-24 DIAGNOSIS — K219 Gastro-esophageal reflux disease without esophagitis: Secondary | ICD-10-CM | POA: Diagnosis not present

## 2019-11-24 DIAGNOSIS — M1990 Unspecified osteoarthritis, unspecified site: Secondary | ICD-10-CM | POA: Diagnosis not present

## 2019-11-24 DIAGNOSIS — I110 Hypertensive heart disease with heart failure: Secondary | ICD-10-CM | POA: Insufficient documentation

## 2019-11-24 DIAGNOSIS — Z8616 Personal history of COVID-19: Secondary | ICD-10-CM | POA: Diagnosis not present

## 2019-11-24 DIAGNOSIS — I083 Combined rheumatic disorders of mitral, aortic and tricuspid valves: Secondary | ICD-10-CM | POA: Insufficient documentation

## 2019-11-24 DIAGNOSIS — Z87891 Personal history of nicotine dependence: Secondary | ICD-10-CM | POA: Insufficient documentation

## 2019-11-24 DIAGNOSIS — I255 Ischemic cardiomyopathy: Secondary | ICD-10-CM | POA: Diagnosis not present

## 2019-11-24 DIAGNOSIS — I493 Ventricular premature depolarization: Secondary | ICD-10-CM | POA: Diagnosis not present

## 2019-11-24 DIAGNOSIS — G629 Polyneuropathy, unspecified: Secondary | ICD-10-CM | POA: Diagnosis not present

## 2019-11-24 DIAGNOSIS — I252 Old myocardial infarction: Secondary | ICD-10-CM | POA: Diagnosis not present

## 2019-11-24 DIAGNOSIS — J4520 Mild intermittent asthma, uncomplicated: Secondary | ICD-10-CM | POA: Insufficient documentation

## 2019-11-24 HISTORY — PX: CATARACT EXTRACTION W/PHACO: SHX586

## 2019-11-24 SURGERY — PHACOEMULSIFICATION, CATARACT, WITH IOL INSERTION
Anesthesia: Monitor Anesthesia Care | Site: Eye | Laterality: Right

## 2019-11-24 MED ORDER — FENTANYL CITRATE (PF) 100 MCG/2ML IJ SOLN
INTRAMUSCULAR | Status: DC | PRN
  Administered 2019-11-24: 50 ug via INTRAVENOUS

## 2019-11-24 MED ORDER — LIDOCAINE HCL (PF) 2 % IJ SOLN
INTRAOCULAR | Status: DC | PRN
  Administered 2019-11-24: 2 mL

## 2019-11-24 MED ORDER — BRIMONIDINE TARTRATE-TIMOLOL 0.2-0.5 % OP SOLN
OPHTHALMIC | Status: DC | PRN
  Administered 2019-11-24: 1 [drp] via OPHTHALMIC

## 2019-11-24 MED ORDER — TETRACAINE HCL 0.5 % OP SOLN
1.0000 [drp] | OPHTHALMIC | Status: DC | PRN
  Administered 2019-11-24 (×3): 1 [drp] via OPHTHALMIC

## 2019-11-24 MED ORDER — MIDAZOLAM HCL 2 MG/2ML IJ SOLN
INTRAMUSCULAR | Status: DC | PRN
  Administered 2019-11-24: 1 mg via INTRAVENOUS

## 2019-11-24 MED ORDER — ARMC OPHTHALMIC DILATING DROPS
1.0000 "application " | OPHTHALMIC | Status: DC | PRN
  Administered 2019-11-24 (×3): 1 via OPHTHALMIC

## 2019-11-24 MED ORDER — MOXIFLOXACIN HCL 0.5 % OP SOLN
OPHTHALMIC | Status: DC | PRN
  Administered 2019-11-24: 0.2 mL via OPHTHALMIC

## 2019-11-24 MED ORDER — NA CHONDROIT SULF-NA HYALURON 40-17 MG/ML IO SOLN
INTRAOCULAR | Status: DC | PRN
  Administered 2019-11-24: 1 mL via INTRAOCULAR

## 2019-11-24 MED ORDER — EPINEPHRINE PF 1 MG/ML IJ SOLN
INTRAOCULAR | Status: DC | PRN
  Administered 2019-11-24: 60 mL via OPHTHALMIC

## 2019-11-24 SURGICAL SUPPLY — 22 items
CANNULA ANT/CHMB 27G (MISCELLANEOUS) ×2 IMPLANT
CANNULA ANT/CHMB 27GA (MISCELLANEOUS) ×6 IMPLANT
DISSECTOR HYDRO NUCLEUS 50X22 (MISCELLANEOUS) ×3 IMPLANT
GLOVE SURG LX 8.0 MICRO (GLOVE) ×2
GLOVE SURG LX STRL 8.0 MICRO (GLOVE) ×1 IMPLANT
GLOVE SURG TRIUMPH 8.0 PF LTX (GLOVE) ×3 IMPLANT
GOWN STRL REUS W/ TWL LRG LVL3 (GOWN DISPOSABLE) ×2 IMPLANT
GOWN STRL REUS W/TWL LRG LVL3 (GOWN DISPOSABLE) ×4
LENS IOL DIOP 23.0 (Intraocular Lens) ×3 IMPLANT
LENS IOL TECNIS MONO 23.0 (Intraocular Lens) IMPLANT
MARKER SKIN DUAL TIP RULER LAB (MISCELLANEOUS) ×3 IMPLANT
NDL FILTER BLUNT 18X1 1/2 (NEEDLE) ×1 IMPLANT
NEEDLE FILTER BLUNT 18X 1/2SAF (NEEDLE) ×2
NEEDLE FILTER BLUNT 18X1 1/2 (NEEDLE) ×1 IMPLANT
PACK EYE AFTER SURG (MISCELLANEOUS) ×3 IMPLANT
PACK OPTHALMIC (MISCELLANEOUS) ×3 IMPLANT
PACK PORFILIO (MISCELLANEOUS) ×3 IMPLANT
RING MALYGIN (MISCELLANEOUS) ×2 IMPLANT
SYR 3ML LL SCALE MARK (SYRINGE) ×3 IMPLANT
SYR TB 1ML LUER SLIP (SYRINGE) ×3 IMPLANT
WATER STERILE IRR 250ML POUR (IV SOLUTION) ×3 IMPLANT
WIPE NON LINTING 3.25X3.25 (MISCELLANEOUS) ×3 IMPLANT

## 2019-11-24 NOTE — Anesthesia Postprocedure Evaluation (Signed)
Anesthesia Post Note  Patient: Mark [name]  Procedure(s) Performed: CATARACT EXTRACTION PHACO AND INTRAOCULAR LENS PLACEMENT ([employer]) RIGHT MALYUGIN 8.59 00:50.8 (Right Eye)     Patient location during evaluation: PACU Anesthesia Type: MAC Level of consciousness: awake and alert Pain management: pain level controlled Vital Signs Assessment: post-procedure vital signs reviewed and stable Respiratory status: spontaneous breathing, nonlabored ventilation, respiratory function stable and patient connected to nasal cannula oxygen Cardiovascular status: stable and blood pressure returned to baseline Postop Assessment: no apparent nausea or vomiting Anesthetic complications: no    Lisa Milian

## 2019-11-24 NOTE — Anesthesia Preprocedure Evaluation (Signed)
Anesthesia Evaluation  Patient identified by MRN, date of birth, ID band Patient awake    Reviewed: NPO status   History of Anesthesia Complications Negative for: history of anesthetic complications  Airway Mallampati: II  TM Distance: >3 FB Neck ROM: full    Dental  (+) Chipped, Poor Dentition, Missing,    Pulmonary asthma (mild) , former smoker,    Pulmonary exam normal        Cardiovascular Exercise Tolerance: Good hypertension, + CAD, + Past MI (2005), + Cardiac Stents (x1 2005) and +CHF (ef=25-30%; Ischemic cardiomyopathy)  Normal cardiovascular exam  stress: 09/2019: Indeterminant treadmill EKG due to baseline EKG changes and bundle branch  block. Large perfusion abnormality of severe intensity of the apical myocardial  region and moderate intensity and moderate in perfusion abnormality of the  inferior myocardium consistent with previous infarct and/or scar without  evidence of myocardial ischemia. Serafina Royals  echo: 09/2019: SEVERE LV SYSTOLIC DYSFUNCTION   WITH MILD LVH NORMAL RIGHT VENTRICULAR SYSTOLIC FUNCTION MILD VALVULAR REGURGITATION NO VALVULAR STENOSIS MILD AR, MR, TR EF 25-30%;  ekg: 09/2019:  SINUS RHYTHM WITH OCCASIONAL PREMATURE VENTRICULAR BEATS LEFT ANTERIOR FASCICULAR BLOCK WHEN COMPARED WITH ECG OF 14-Sep-2019 17:45, PREMATURE VENTRICULAR BEATS ARE NOW PRESENT;     Neuro/Psych Ddd; Neuropathy legs negative psych ROS   GI/Hepatic Neg liver ROS, GERD  Controlled,  Endo/Other  negative endocrine ROS  Renal/GU negative Renal ROS   bph    Musculoskeletal  (+) Arthritis ,   Abdominal   Peds  Hematology negative hematology ROS (+)   Anesthesia Other Findings COVID: 09/01/2019 symptoms resolved 09/05/19;  cards cleared: Flossie Dibble, MD - 10/13/2019;  Last aspirin: 4/26;    Reproductive/Obstetrics                             Anesthesia  Physical Anesthesia Plan  ASA: III  Anesthesia Plan: MAC   Post-op Pain Management:    Induction:   PONV Risk Score and Plan: 1 and TIVA  Airway Management Planned:   Additional Equipment:   Intra-op Plan:   Post-operative Plan:   Informed Consent: I have reviewed the patients History and Physical, chart, labs and discussed the procedure including the risks, benefits and alternatives for the proposed anesthesia with the patient or authorized representative who has indicated his/her understanding and acceptance.       Plan Discussed with: CRNA  Anesthesia Plan Comments:         Anesthesia Quick Evaluation

## 2019-11-24 NOTE — H&P (Signed)
All labs reviewed. Abnormal studies sent to patients PCP when indicated.  Previous H&P reviewed, patient examined, there are NO CHANGES.  Mark Parham Porfilio4/27/20218:47 AM

## 2019-11-24 NOTE — Transfer of Care (Signed)
Immediate Anesthesia Transfer of Care Note  Patient: Mark [name]  Procedure(s) Performed: CATARACT EXTRACTION PHACO AND INTRAOCULAR LENS PLACEMENT ([facility]) RIGHT MALYUGIN 8.59 00:50.8 (Right Eye)  Patient Location: PACU  Anesthesia Type: MAC  Level of Consciousness: awake, alert  and patient cooperative  Airway and Oxygen Therapy: Patient Spontanous Breathing and Patient connected to supplemental oxygen  Post-op Assessment: Post-op Vital signs reviewed, Patient's Cardiovascular Status Stable, Respiratory Function Stable, Patent Airway and No signs of Nausea or vomiting  Post-op Vital Signs: Reviewed and stable  Complications: No apparent anesthesia complications

## 2019-11-24 NOTE — Anesthesia Procedure Notes (Signed)
Procedure Name: [place] Performed by: Cameron Ali, CRNA Pre-anesthesia Checklist: Patient identified, Emergency Drugs available, Suction available, Timeout performed and Patient being monitored Patient Re-evaluated:Patient Re-evaluated prior to induction Oxygen Delivery Method: Nasal cannula Placement Confirmation: positive ETCO2

## 2019-11-24 NOTE — Op Note (Signed)
PREOPERATIVE DIAGNOSIS:  Nuclear sclerotic cataract of the right eye.   POSTOPERATIVE DIAGNOSIS:  H25.11 Cataract   OPERATIVE PROCEDURE:@   SURGEON:  Birder Robson, MD.   ANESTHESIA:  Anesthesiologist: Fidel Levy, MD CRNA: Cameron Ali, CRNA  1.      Managed anesthesia care. 2.      0.73ml of Shugarcaine was instilled in the eye following the paracentesis.   COMPLICATIONS: Viscoelastic was used to raise the pupil margin.  A  Malyugin ring was placed as the pupil would not achieve sufficient pharmacologic dilation to undergo cataract extraction safely.( The ring was removed atraumatically following insertion of the IOL.)    TECHNIQUE:   Stop and chop   DESCRIPTION OF PROCEDURE:  The patient was examined and consented in the preoperative holding area where the aforementioned topical anesthesia was applied to the right eye and then brought back to the Operating Room where the right eye was prepped and draped in the usual sterile ophthalmic fashion and a lid speculum was placed. A paracentesis was created with the side port blade and the anterior chamber was filled with viscoelastic. A near clear corneal incision was performed with the steel keratome. A continuous curvilinear capsulorrhexis was performed with a cystotome followed by the capsulorrhexis forceps. Hydrodissection and hydrodelineation were carried out with BSS on a blunt cannula. The lens was removed in a stop and chop  technique and the remaining cortical material was removed with the irrigation-aspiration handpiece. The capsular bag was inflated with viscoelastic and the Technis ZCB00  lens was placed in the capsular bag without complication. The remaining viscoelastic was removed from the eye with the irrigation-aspiration handpiece. The wounds were hydrated. The anterior chamber was flushed with BSS and the eye was inflated to physiologic pressure. 0.37ml of Vigamox was placed in the anterior chamber. The wounds were found to  be water tight. The eye was dressed with Combigan. The patient was given protective glasses to wear throughout the day and a shield with which to sleep tonight. The patient was also given drops with which to begin a drop regimen today and will follow-up with me in one day. Implant Name Type Inv. Item Serial No. Manufacturer Lot No. LRB No. Used Action  LENS IOL DIOP 23.0 - [id number] Intraocular Lens LENS IOL DIOP 23.0 [id number] AMO  Right 1 Implanted   Procedure(s) with comments: CATARACT EXTRACTION PHACO AND INTRAOCULAR LENS PLACEMENT ([facility]) RIGHT MALYUGIN 8.59 00:50.8 (Right) - COVID ( + ) 09-01-19  Electronically signed: Birder Robson 11/24/2019 9:18 AM

## 2019-11-25 ENCOUNTER — Encounter: Payer: Self-pay | Admitting: *Deleted

## 2019-11-25 DIAGNOSIS — H2512 Age-related nuclear cataract, left eye: Secondary | ICD-10-CM | POA: Diagnosis not present

## 2019-12-02 ENCOUNTER — Other Ambulatory Visit: Payer: Self-pay | Admitting: Family Medicine

## 2019-12-02 DIAGNOSIS — E7800 Pure hypercholesterolemia, unspecified: Secondary | ICD-10-CM

## 2019-12-02 DIAGNOSIS — K219 Gastro-esophageal reflux disease without esophagitis: Secondary | ICD-10-CM

## 2019-12-08 ENCOUNTER — Other Ambulatory Visit: Payer: Self-pay

## 2019-12-11 ENCOUNTER — Other Ambulatory Visit
Admission: RE | Admit: 2019-12-11 | Discharge: 2019-12-11 | Disposition: A | Discharge status: Home / Self Care | Payer: Medicare HMO | Source: Ambulatory Visit | Attending: Ophthalmology | Admitting: Ophthalmology

## 2019-12-11 ENCOUNTER — Other Ambulatory Visit: Payer: Self-pay

## 2019-12-11 DIAGNOSIS — Z01812 Encounter for preprocedural laboratory examination: Secondary | ICD-10-CM | POA: Insufficient documentation

## 2019-12-11 DIAGNOSIS — Z20822 Contact with and (suspected) exposure to covid-19: Secondary | ICD-10-CM | POA: Insufficient documentation

## 2019-12-11 LAB — SARS CORONAVIRUS 2 (TAT 6-24 HRS): SARS Coronavirus 2: NEGATIVE

## 2019-12-14 NOTE — Discharge Instructions (Signed)

## 2019-12-15 ENCOUNTER — Encounter: Payer: Self-pay | Admitting: Ophthalmology

## 2019-12-15 ENCOUNTER — Ambulatory Visit: Payer: Medicare HMO | Admitting: Anesthesiology

## 2019-12-15 ENCOUNTER — Encounter
Admission: RE | Disposition: A | Discharge status: Home / Self Care | Payer: Self-pay | Source: Home / Self Care | Attending: Ophthalmology

## 2019-12-15 ENCOUNTER — Other Ambulatory Visit: Payer: Self-pay

## 2019-12-15 ENCOUNTER — Ambulatory Visit
Admission: RE | Admit: 2019-12-15 | Discharge: 2019-12-15 | Disposition: A | Discharge status: Home / Self Care | Payer: Medicare HMO | Attending: Ophthalmology | Admitting: Ophthalmology

## 2019-12-15 DIAGNOSIS — Z87891 Personal history of nicotine dependence: Secondary | ICD-10-CM | POA: Diagnosis not present

## 2019-12-15 DIAGNOSIS — H2512 Age-related nuclear cataract, left eye: Secondary | ICD-10-CM | POA: Diagnosis not present

## 2019-12-15 DIAGNOSIS — I252 Old myocardial infarction: Secondary | ICD-10-CM | POA: Diagnosis not present

## 2019-12-15 DIAGNOSIS — K219 Gastro-esophageal reflux disease without esophagitis: Secondary | ICD-10-CM | POA: Insufficient documentation

## 2019-12-15 DIAGNOSIS — I110 Hypertensive heart disease with heart failure: Secondary | ICD-10-CM | POA: Insufficient documentation

## 2019-12-15 DIAGNOSIS — I509 Heart failure, unspecified: Secondary | ICD-10-CM | POA: Diagnosis not present

## 2019-12-15 DIAGNOSIS — Z7982 Long term (current) use of aspirin: Secondary | ICD-10-CM | POA: Diagnosis not present

## 2019-12-15 DIAGNOSIS — Z8616 Personal history of COVID-19: Secondary | ICD-10-CM | POA: Insufficient documentation

## 2019-12-15 DIAGNOSIS — J45909 Unspecified asthma, uncomplicated: Secondary | ICD-10-CM | POA: Diagnosis not present

## 2019-12-15 DIAGNOSIS — E785 Hyperlipidemia, unspecified: Secondary | ICD-10-CM | POA: Insufficient documentation

## 2019-12-15 DIAGNOSIS — Z955 Presence of coronary angioplasty implant and graft: Secondary | ICD-10-CM | POA: Insufficient documentation

## 2019-12-15 DIAGNOSIS — Z9841 Cataract extraction status, right eye: Secondary | ICD-10-CM | POA: Diagnosis not present

## 2019-12-15 DIAGNOSIS — Z79899 Other long term (current) drug therapy: Secondary | ICD-10-CM | POA: Diagnosis not present

## 2019-12-15 DIAGNOSIS — I2510 Atherosclerotic heart disease of native coronary artery without angina pectoris: Secondary | ICD-10-CM | POA: Diagnosis not present

## 2019-12-15 DIAGNOSIS — I255 Ischemic cardiomyopathy: Secondary | ICD-10-CM | POA: Diagnosis not present

## 2019-12-15 DIAGNOSIS — H25812 Combined forms of age-related cataract, left eye: Secondary | ICD-10-CM | POA: Diagnosis not present

## 2019-12-15 DIAGNOSIS — I739 Peripheral vascular disease, unspecified: Secondary | ICD-10-CM | POA: Diagnosis not present

## 2019-12-15 HISTORY — PX: CATARACT EXTRACTION W/PHACO: SHX586

## 2019-12-15 SURGERY — PHACOEMULSIFICATION, CATARACT, WITH IOL INSERTION
Anesthesia: Monitor Anesthesia Care | Site: Eye | Laterality: Left

## 2019-12-15 MED ORDER — BRIMONIDINE TARTRATE-TIMOLOL 0.2-0.5 % OP SOLN
OPHTHALMIC | Status: DC | PRN
  Administered 2019-12-15: 1 [drp] via OPHTHALMIC

## 2019-12-15 MED ORDER — NA CHONDROIT SULF-NA HYALURON 40-17 MG/ML IO SOLN
INTRAOCULAR | Status: DC | PRN
  Administered 2019-12-15: 1 mL via INTRAOCULAR

## 2019-12-15 MED ORDER — ONDANSETRON HCL 4 MG/2ML IJ SOLN: 4.0000 mg | Freq: Once | INTRAMUSCULAR | Status: DC | PRN

## 2019-12-15 MED ORDER — MIDAZOLAM HCL 2 MG/2ML IJ SOLN
INTRAMUSCULAR | Status: DC | PRN
  Administered 2019-12-15: .5 mg via INTRAVENOUS

## 2019-12-15 MED ORDER — TETRACAINE HCL 0.5 % OP SOLN
1.0000 [drp] | OPHTHALMIC | Status: DC | PRN
  Administered 2019-12-15 (×3): 1 [drp] via OPHTHALMIC

## 2019-12-15 MED ORDER — EPINEPHRINE PF 1 MG/ML IJ SOLN
INTRAOCULAR | Status: DC | PRN
  Administered 2019-12-15: 95 mL via OPHTHALMIC

## 2019-12-15 MED ORDER — FENTANYL CITRATE (PF) 100 MCG/2ML IJ SOLN
INTRAMUSCULAR | Status: DC | PRN
  Administered 2019-12-15: 50 ug via INTRAVENOUS

## 2019-12-15 MED ORDER — MOXIFLOXACIN HCL 0.5 % OP SOLN
OPHTHALMIC | Status: DC | PRN
  Administered 2019-12-15: 0.2 mL via OPHTHALMIC

## 2019-12-15 MED ORDER — LIDOCAINE HCL (PF) 2 % IJ SOLN
INTRAOCULAR | Status: DC | PRN
  Administered 2019-12-15: 1 mL

## 2019-12-15 MED ORDER — ARMC OPHTHALMIC DILATING DROPS
1.0000 "application " | OPHTHALMIC | Status: DC | PRN
  Administered 2019-12-15 (×3): 1 via OPHTHALMIC

## 2019-12-15 MED ORDER — ACETAMINOPHEN 10 MG/ML IV SOLN: 1000.0000 mg | Freq: Once | INTRAVENOUS | Status: DC | PRN

## 2019-12-15 MED ORDER — LACTATED RINGERS IV SOLN: 100.0000 mL/h | INTRAVENOUS | Status: DC

## 2019-12-15 SURGICAL SUPPLY — 21 items
CANNULA ANT/CHMB 27G (MISCELLANEOUS) ×2 IMPLANT
CANNULA ANT/CHMB 27GA (MISCELLANEOUS) ×4 IMPLANT
GLOVE SURG LX 8.0 MICRO (GLOVE) ×1
GLOVE SURG LX STRL 8.0 MICRO (GLOVE) ×1 IMPLANT
GLOVE SURG TRIUMPH 8.0 PF LTX (GLOVE) ×2 IMPLANT
GOWN STRL REUS W/ TWL LRG LVL3 (GOWN DISPOSABLE) ×2 IMPLANT
GOWN STRL REUS W/TWL LRG LVL3 (GOWN DISPOSABLE) ×2
LENS IOL DIOP 24.0 (Intraocular Lens) ×2 IMPLANT
LENS IOL TECNIS MONO 24.0 (Intraocular Lens) IMPLANT
MARKER SKIN DUAL TIP RULER LAB (MISCELLANEOUS) ×2 IMPLANT
NDL FILTER BLUNT 18X1 1/2 (NEEDLE) ×1 IMPLANT
NEEDLE FILTER BLUNT 18X 1/2SAF (NEEDLE) ×1
NEEDLE FILTER BLUNT 18X1 1/2 (NEEDLE) ×1 IMPLANT
PACK EYE AFTER SURG (MISCELLANEOUS) ×2 IMPLANT
PACK OPTHALMIC (MISCELLANEOUS) ×2 IMPLANT
PACK PORFILIO (MISCELLANEOUS) ×2 IMPLANT
RING MALYGIN (MISCELLANEOUS) ×1 IMPLANT
SYR 3ML LL SCALE MARK (SYRINGE) ×2 IMPLANT
SYR TB 1ML LUER SLIP (SYRINGE) ×2 IMPLANT
WATER STERILE IRR 250ML POUR (IV SOLUTION) ×2 IMPLANT
WIPE NON LINTING 3.25X3.25 (MISCELLANEOUS) ×2 IMPLANT

## 2019-12-15 NOTE — Op Note (Signed)
PREOPERATIVE DIAGNOSIS:  Nuclear sclerotic cataract of the left eye.   POSTOPERATIVE DIAGNOSIS:  Nuclear sclerotic cataract of the left eye.   OPERATIVE PROCEDURE:@   SURGEON:  Birder Robson, MD.   ANESTHESIA:  Anesthesiologist: Heniser, Fredric Dine, MD CRNA: Vanetta Shawl, CRNA  1.      Managed anesthesia care. 2.     0.34ml of Shugarcaine was instilled following the paracentesis   COMPLICATIONS: Viscoelastic was used to raise the pupil margin.  A  Malyugin ring was placed as the pupil would not achieve sufficient pharmacologic dilation to undergo cataract extraction safely.( The ring was removed atraumatically following insertion of the IOL.)    TECHNIQUE:   Stop and chop   DESCRIPTION OF PROCEDURE:  The patient was examined and consented in the preoperative holding area where the aforementioned topical anesthesia was applied to the left eye and then brought back to the Operating Room where the left eye was prepped and draped in the usual sterile ophthalmic fashion and a lid speculum was placed. A paracentesis was created with the side port blade and the anterior chamber was filled with viscoelastic. A near clear corneal incision was performed with the steel keratome. A continuous curvilinear capsulorrhexis was performed with a cystotome followed by the capsulorrhexis forceps. Hydrodissection and hydrodelineation were carried out with BSS on a blunt cannula. The lens was removed in a stop and chop  technique and the remaining cortical material was removed with the irrigation-aspiration handpiece. The capsular bag was inflated with viscoelastic and the Technis ZCB00 lens was placed in the capsular bag without complication. The remaining viscoelastic was removed from the eye with the irrigation-aspiration handpiece. The wounds were hydrated. The anterior chamber was flushed with BSS and the eye was inflated to physiologic pressure. 0.56ml Vigamox was placed in the anterior chamber. The wounds  were found to be water tight. The eye was dressed with Combigan. The patient was given protective glasses to wear throughout the day and a shield with which to sleep tonight. The patient was also given drops with which to begin a drop regimen today and will follow-up with me in one day. Implant Name Type Inv. Item Serial No. Manufacturer Lot No. LRB No. Used Action  LENS IOL DIOP 24.0 - [id number] Intraocular Lens LENS IOL DIOP 24.0 [id number] AMO  Left 1 Implanted    Procedure(s): CATARACT EXTRACTION PHACO AND INTRAOCULAR LENS PLACEMENT ([facility]) LEFT 9.90  01:03.7 (Left)  Electronically signed: Birder Robson 12/15/2019 11:19 AM

## 2019-12-15 NOTE — Anesthesia Postprocedure Evaluation (Signed)
Anesthesia Post Note  Patient: Mark [name]  Procedure(s) Performed: CATARACT EXTRACTION PHACO AND INTRAOCULAR LENS PLACEMENT ([facility]) LEFT 9.90  01:03.7 (Left Eye)     Patient location during evaluation: PACU Anesthesia Type: MAC Level of consciousness: awake and alert Pain management: pain level controlled Vital Signs Assessment: post-procedure vital signs reviewed and stable Respiratory status: spontaneous breathing, nonlabored ventilation, respiratory function stable and patient connected to nasal cannula oxygen Cardiovascular status: stable and blood pressure returned to baseline Postop Assessment: no apparent nausea or vomiting Anesthetic complications: no    Roshawn Ayala A  Regla Fitzgibbon

## 2019-12-15 NOTE — Transfer of Care (Signed)
Immediate Anesthesia Transfer of Care Note  Patient: Mark [name]  Procedure(s) Performed: CATARACT EXTRACTION PHACO AND INTRAOCULAR LENS PLACEMENT ([facility]) LEFT 9.90  01:03.7 (Left Eye)  Patient Location: PACU  Anesthesia Type: MAC  Level of Consciousness: awake, alert  and patient cooperative  Airway and Oxygen Therapy: Patient Spontanous Breathing   Post-op Assessment: Post-op Vital signs reviewed, Patient's Cardiovascular Status Stable, Respiratory Function Stable, Patent Airway and No signs of Nausea or vomiting  Post-op Vital Signs: Reviewed and stable  Complications: No apparent anesthesia complications

## 2019-12-15 NOTE — Anesthesia Procedure Notes (Signed)
Procedure Name: MAC Date/Time: 12/15/2019 10:55 AM Performed by: Vanetta Shawl, CRNA Pre-anesthesia Checklist: Patient identified, Emergency Drugs available, Suction available, Timeout performed and Patient being monitored Patient Re-evaluated:Patient Re-evaluated prior to induction Oxygen Delivery Method: Nasal cannula Placement Confirmation: positive ETCO2

## 2019-12-15 NOTE — Anesthesia Preprocedure Evaluation (Signed)
Anesthesia Evaluation  Patient identified by MRN, date of birth, ID band Patient awake    Reviewed: Allergy & Precautions, NPO status , Patient's Chart, lab work & pertinent test results, reviewed documented beta blocker date and time   History of Anesthesia Complications Negative for: history of anesthetic complications  Airway Mallampati: II  TM Distance: >3 FB Neck ROM: Limited    Dental  (+) Chipped, Poor Dentition, Missing,    Pulmonary asthma (mild) , former smoker,   COVID+ 09/14/19   Pulmonary exam normal breath sounds clear to auscultation       Cardiovascular Exercise Tolerance: Poor hypertension, (-) angina+ CAD, + Past MI (2005), + Cardiac Stents (x1 2005), + Peripheral Vascular Disease (Carotid stenosis), +CHF (ef=25-30%; Ischemic cardiomyopathy) and + DOE (Stable)  Normal cardiovascular exam Rhythm:Regular Rate:Normal   HLD  stress: 09/2019: Indeterminant treadmill EKG due to baseline EKG changes and bundle branch block. Large perfusion abnormality of severe intensity of the apical myocardial  region and moderate intensity and moderate in perfusion abnormality of the inferior myocardium consistent with previous infarct and/or scar without evidence of myocardial ischemia. Serafina Royals  echo: 09/2019: SEVERE LV SYSTOLIC DYSFUNCTION   WITH MILD LVH NORMAL RIGHT VENTRICULAR SYSTOLIC FUNCTION MILD VALVULAR REGURGITATION NO VALVULAR STENOSIS MILD AR, MR, TR EF 25-30%   Neuro/Psych Ddd; Neuropathy legs  Neuromuscular disease (Lumbar radiculopathy)    GI/Hepatic GERD  Controlled,  Endo/Other    Renal/GU    bph    Musculoskeletal  (+) Arthritis ,   Abdominal   Peds  Hematology   Anesthesia Other Findings Cards cleared: Flossie Dibble, MD - 10/13/2019   Reproductive/Obstetrics                             Anesthesia Physical  Anesthesia Plan  ASA: III  Anesthesia  Plan: MAC   Post-op Pain Management:    Induction: Intravenous  PONV Risk Score and Plan: 1 and TIVA, Treatment may vary due to age or medical condition and Midazolam  Airway Management Planned: Nasal Cannula  Additional Equipment:   Intra-op Plan:   Post-operative Plan:   Informed Consent: I have reviewed the patients History and Physical, chart, labs and discussed the procedure including the risks, benefits and alternatives for the proposed anesthesia with the patient or authorized representative who has indicated his/her understanding and acceptance.       Plan Discussed with: CRNA  Anesthesia Plan Comments:         Anesthesia Quick Evaluation

## 2019-12-15 NOTE — H&P (Signed)
All labs reviewed. Abnormal studies sent to patients PCP when indicated.  Previous H&P reviewed, patient examined, there are NO CHANGES.  Mark [name] Porfilio5/18/202110:46 AM

## 2019-12-16 ENCOUNTER — Other Ambulatory Visit: Payer: Self-pay | Admitting: Family Medicine

## 2019-12-16 ENCOUNTER — Encounter: Payer: Self-pay | Admitting: *Deleted

## 2019-12-16 DIAGNOSIS — J3089 Other allergic rhinitis: Secondary | ICD-10-CM

## 2019-12-16 NOTE — Telephone Encounter (Signed)
Requested Prescriptions  Pending Prescriptions Disp Refills  . montelukast (SINGULAIR) 10 MG tablet [Pharmacy Med Name: MONTELUKAST SODIUM 10 MG Tablet] 90 tablet 1    Sig: TAKE 1 TABLET AT BEDTIME     Pulmonology:  Leukotriene Inhibitors Passed - 12/16/2019  2:22 PM      Passed - Valid encounter within last 12 months    Recent Outpatient Visits          2 months ago Syncope, unspecified syncope type   [facility] Steele Sizer, MD   3 months ago Respiratory tract infection due to COVID-19 virus   [place], [place]   3 months ago Shortness of breath   [facility] Delsa Grana, PA-C   4 months ago CHF (congestive heart failure), NYHA class III, chronic, systolic [facility])   [facility] Steele Sizer, MD   10 months ago Thoracic aortic aneurysm without rupture [facility])   [facility] Steele Sizer, MD      Future Appointments            In 1 week Steele Sizer, MD [facility], [place]   In 1 month Ralene Bathe, MD [place]   In 11 months  [facility], [facility]

## 2019-12-25 ENCOUNTER — Ambulatory Visit (INDEPENDENT_AMBULATORY_CARE_PROVIDER_SITE_OTHER): Payer: Medicare HMO | Admitting: Family Medicine

## 2019-12-25 ENCOUNTER — Encounter: Payer: Self-pay | Admitting: Family Medicine

## 2019-12-25 ENCOUNTER — Other Ambulatory Visit: Payer: Self-pay

## 2019-12-25 VITALS — BP 116/82 | HR 78 | Temp 97.8°F | Resp 14 | Ht 68.0 in | Wt 189.0 lb

## 2019-12-25 DIAGNOSIS — I700 Atherosclerosis of aorta: Secondary | ICD-10-CM

## 2019-12-25 DIAGNOSIS — J3089 Other allergic rhinitis: Secondary | ICD-10-CM

## 2019-12-25 DIAGNOSIS — D692 Other nonthrombocytopenic purpura: Secondary | ICD-10-CM

## 2019-12-25 DIAGNOSIS — E7800 Pure hypercholesterolemia, unspecified: Secondary | ICD-10-CM

## 2019-12-25 DIAGNOSIS — K219 Gastro-esophageal reflux disease without esophagitis: Secondary | ICD-10-CM

## 2019-12-25 DIAGNOSIS — I712 Thoracic aortic aneurysm, without rupture, unspecified: Secondary | ICD-10-CM

## 2019-12-25 DIAGNOSIS — I10 Essential (primary) hypertension: Secondary | ICD-10-CM

## 2019-12-25 DIAGNOSIS — R600 Localized edema: Secondary | ICD-10-CM | POA: Diagnosis not present

## 2019-12-25 DIAGNOSIS — Z87898 Personal history of other specified conditions: Secondary | ICD-10-CM

## 2019-12-25 DIAGNOSIS — R739 Hyperglycemia, unspecified: Secondary | ICD-10-CM | POA: Diagnosis not present

## 2019-12-25 DIAGNOSIS — I771 Stricture of artery: Secondary | ICD-10-CM

## 2019-12-25 DIAGNOSIS — G629 Polyneuropathy, unspecified: Secondary | ICD-10-CM | POA: Diagnosis not present

## 2019-12-25 DIAGNOSIS — I6523 Occlusion and stenosis of bilateral carotid arteries: Secondary | ICD-10-CM

## 2019-12-25 DIAGNOSIS — I5022 Chronic systolic (congestive) heart failure: Secondary | ICD-10-CM

## 2019-12-25 DIAGNOSIS — J8410 Pulmonary fibrosis, unspecified: Secondary | ICD-10-CM | POA: Diagnosis not present

## 2019-12-25 DIAGNOSIS — H532 Diplopia: Secondary | ICD-10-CM

## 2019-12-25 MED ORDER — METOPROLOL SUCCINATE ER 25 MG PO TB24: 12.5000 mg | ORAL_TABLET | Freq: Every day | ORAL | 1 refills | Status: DC

## 2019-12-25 MED ORDER — PANTOPRAZOLE SODIUM 40 MG PO TBEC: 40.0000 mg | DELAYED_RELEASE_TABLET | Freq: Every day | ORAL | 0 refills | Status: DC

## 2019-12-25 MED ORDER — ATORVASTATIN CALCIUM 10 MG PO TABS: 10.0000 mg | ORAL_TABLET | Freq: Every day | ORAL | 1 refills | Status: DC

## 2019-12-25 NOTE — Progress Notes (Signed)
Name: Mark [name]   MRN: [id number]    DOB: 1928/11/27   Date:12/25/2019       Progress Note  Subjective  Chief Complaint  Chief Complaint  Patient presents with  . Follow-up    Syncope  . Foot Swelling    right ankle     HPI  CHF: he is under the care of Dr. Nehemiah Massed. He had syncope earlier in 2021 seen by Dr. Scarlette Ar, on lower dose of lopressor but only taking it once daily, he states has dizziness when he gets up quickly a couple of times a month.  He was at [facility] for 6 months, but moved back June 1st 2020 because he likes doing things around the house. He has SOB with moderate activity , he denies orthopnea. He status post STEMI and PCI with stent to mid LAD in 2005 . He had repeat Echo and Stress Myoview, previous Echo was 40 % and is down to 20-30 % , he states stable, continues to have right lower extremity edema worse than left, he sees Dr. Erven Colla and will see him in July . He does not like compression stocking hoses   Last Echo was done 10/13/2019   INTERPRETATION SEVERE LV SYSTOLIC DYSFUNCTION (See above)   WITH MILD LVH NORMAL RIGHT VENTRICULAR SYSTOLIC FUNCTION MILD VALVULAR REGURGITATION (See above) NO VALVULAR STENOSIS MILD AR, MR, TR EF 25-30%  Last Myoview stress test 10/13/2019  Regional wall motion: demonstrates akinesis of the Apical and distal  inferior myocardium. The overall quality of the study is good.  Artifacts noted: yes  Left ventricular cavity: enlarged.  Perfusion Analysis: SPECT images demonstrate large perfusion abnormality  of severe intensity is present in the apical myocardial region. There is  a moderate perfusion abnormality of moderate intensity of the inferior  myocardial region on the stress images. Resting images show unchanged  abnormal perfusion abnormalities of stress portion. This suggests no  evidence of myocardial ischemia and previous myocardial infarction.  Defect type: Fixed   GERD:  taking medication and symptoms are controlled at this time, no heartburn or regurgitation., weight is stable    BPH: he stopped taking flomax and has not noticed any lower urinary symptoms, no nocturia , denies weak strea . Unchanged   Lung nodules/pulmonary fibrosis : seen by Dr. Mortimer Fries. He states he does not want repeat studies, last study was 01/2019. CT chest done 2019 showed some pulmonary fibrosis, he used to smoke but not currently .No cough or clearing throat anymore doing well on singulair and loratadine , he has enough medications at home   AR: denies sneezing, rhinorrhea or cough . He states no longer using nasal spray He is on singulair   Atherosclerosis aorta/Aneurysm of thoracic aorta: discussed results with patient, he is not interested in changing management , also discussed aneurysm of aorta, guidelines recommends repeat US in 2024 . He is still taking Atorvastatin , he has follow up with vascular surgeon   History of skin cancer: under the care of Dr. Erin Fulling, no new lesions.  DDDlumbar spine and radiculitis: he has numbness and tingling on right leg only and it affects his walk at times, occasionally numbness on left lower leg. On gabapentin and tolerates medication well, only taking it twice a day and symptoms are controlled , states not bothered by back pain or paresthesias at this time   Patient Active Problem List   Diagnosis Date Noted  . Syncope 09/14/2019  . Respiratory tract infection  due to COVID-19 virus 09/04/2019  . Fibrosis of lung ([place]) 09/29/2018  . Senile purpura ([place]) 09/29/2018  . Atherosclerosis of aorta ([place]) 09/29/2018  . Abnormal ECG 06/16/2018  . Thoracic aortic aneurysm without rupture ([place]) 05/28/2018  . CHF (congestive heart failure), NYHA class III, chronic, systolic ([place]) [id number]  . 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 Ischemic cardiomyopathy 06/11/2017  . Benign essential HTN 05/22/2017  . Bilateral  carotid artery stenosis 05/22/2017  . Neuropathy 01/28/2017  . Right leg paresthesias 11/26/2016  . Hypertension 08/27/2016  . GERD (gastroesophageal reflux disease) 08/27/2016  . Hyperglycemia 05/10/2016  . History of nonmelanoma skin cancer 10/10/2015  . Pain in right shoulder 09/13/2015  . Osteoarthritis 07/06/2015  . Leg swelling 05/30/2015  . Other specified soft tissue disorders 05/30/2015  . Hyperlipidemia 03/29/2015  . Family history of type 2 diabetes mellitus in father 03/29/2015  . DD (diverticular disease) 02/03/2015  . Diverticulosis of intestine without perforation or abscess without bleeding 02/03/2015  . DDD (degenerative disc disease), lumbar 03/03/2014  . Intervertebral disc disorder with radiculopathy of lumbar region 03/03/2014  . Lumbar radiculitis 03/03/2014  . Lumbar stenosis with neurogenic claudication 03/03/2014  . Arteriosclerosis of coronary artery 04/18/2012  . S/P coronary artery stent placement 04/18/2012  . Status post percutaneous transluminal coronary angioplasty 04/18/2012  . Malignant melanoma of skin, unspecified ([place]) 04/18/2012    Past Surgical History:  Procedure Laterality Date  . CATARACT EXTRACTION W/PHACO Right 11/24/2019   Procedure: CATARACT EXTRACTION PHACO AND INTRAOCULAR LENS PLACEMENT (IOC) RIGHT MALYUGIN 8.59 00:50.8;  Surgeon: Birder Robson, MD;  Location: [facility];  Service: Ophthalmology;  Laterality: Right;  COVID ( + ) 09-01-19  . CATARACT EXTRACTION W/PHACO Left 12/15/2019   Procedure: CATARACT EXTRACTION PHACO AND INTRAOCULAR LENS PLACEMENT (IOC) LEFT 9.90  01:03.7;  Surgeon: Birder Robson, MD;  Location: [facility];  Service: Ophthalmology;  Laterality: Left;  . CORONARY ANGIOPLASTY WITH STENT PLACEMENT  2005   [facility]    Family History  Problem Relation Age of Onset  . Hypertension Mother   . Heart disease Mother   . Heart disease Father   . Hypertension Father     Social History   Tobacco  Use  . Smoking status: Former Smoker    Quit date: 05/16/1963    Years since quitting: 56.6  . Smokeless tobacco: Never Used  . Tobacco comment: quit over 50 years ago  Substance Use Topics  . Alcohol use: No     Current Outpatient Medications:  .  acetaminophen (TYLENOL) 500 MG tablet, Take 500 mg by mouth every 6 (six) hours as needed., Disp: , Rfl:  .  albuterol (VENTOLIN HFA) 108 (90 Base) MCG/ACT inhaler, Inhale 2 puffs into the lungs every 6 (six) hours as needed for wheezing or shortness of breath., Disp: 18 g, Rfl: 0 .  aspirin EC 81 MG tablet, Take 81 mg by mouth daily. , Disp: , Rfl:  .  atorvastatin (LIPITOR) 10 MG tablet, TAKE 1 TABLET EVERY DAY, Disp: 90 tablet, Rfl: 0 .  gabapentin (NEURONTIN) 400 MG capsule, Take 1 capsule (400 mg total) by mouth 3 (three) times daily. (Patient taking differently: Take 400 mg by mouth 3 (three) times daily. Pt taking twice daily), Disp: 180 capsule, Rfl: 1 .  Loratadine 10 MG CAPS, Take 10 mg by mouth daily. , Disp: , Rfl:  .  metoprolol tartrate (LOPRESSOR) 25 MG tablet, Take 0.5 tablets (12.5 mg total) by mouth daily., Disp: 1  tablet, Rfl: 0 .  montelukast (SINGULAIR) 10 MG tablet, TAKE 1 TABLET AT BEDTIME, Disp: 90 tablet, Rfl: 1 .  pantoprazole (PROTONIX) 40 MG tablet, TAKE 1 TABLET EVERY DAY, Disp: 90 tablet, Rfl: 0 .  Probiotic Product (PROBIOTIC PO), Take by mouth daily. , Disp: , Rfl:  .  tamsulosin (FLOMAX) 0.4 MG CAPS capsule, Take 1 capsule (0.4 mg total) by mouth daily., Disp: 90 capsule, Rfl: 3  Allergies  Allergen Reactions  . Breo Ellipta [Fluticasone Furoate-Vilanterol] Rash    Patient stated that it made his mouth break out  . Rosuvastatin Rash and Other (See Comments)    No reaction per pt, He saw some information regarding Crestor and adverse effects     I personally reviewed active problem list, medication list, allergies, family history, social history, health maintenance with the patient/caregiver  today.   ROS  Constitutional: Negative for fever or weight change.  Respiratory: Negative for cough , he has intermittent  shortness of breath.   Cardiovascular: Negative for chest pain or palpitations.  Gastrointestinal: Negative for abdominal pain, no bowel changes.  Musculoskeletal: Negative for gait problem or joint swelling.  Skin: Negative for rash.  Neurological: Positive  for intermittent dizziness but no headache.  No other specific complaints in a complete review of systems (except as listed in HPI above).  Objective  Vitals:   12/25/19 0851  BP: 116/82  Pulse: 78  Resp: 14  Temp: 97.8 F (36.6 C)  TempSrc: Temporal  SpO2: 94%  Weight: 189 lb (85.7 kg)  Height: 5\' 8"  (1.727 m)    Body mass index is 28.74 kg/m.  Physical Exam  Constitutional: Patient appears well-developed and well-nourished. No distress.  HEENT: head atraumatic, normocephalic, pupils equal and reactive to light, eneck supple Cardiovascular: Normal rate, regular rhythm and normal heart sounds.  No murmur heard. 1 plus right lower extremity edema, left trace   Pulmonary/Chest: Effort normal and breath sounds normal. No respiratory distress. Abdominal: Soft.  There is no tenderness. Psychiatric: Patient has a normal mood and affect. behavior is normal. Judgment and thought content normal. Skin: senile purpura   Recent Results (from the past 2160 hour(s))  SARS CORONAVIRUS 2 (TAT 6-24 HRS) Nasopharyngeal Nasopharyngeal Swab     Status: None   Collection Time: 12/11/19 11:16 AM   Specimen: Nasopharyngeal Swab  Result Value Ref Range   SARS Coronavirus 2 NEGATIVE NEGATIVE    Comment: (NOTE) SARS-CoV-2 target nucleic acids are NOT DETECTED. The SARS-CoV-2 RNA is generally detectable in upper and lower respiratory specimens during the acute phase of infection. Negative results do not preclude SARS-CoV-2 infection, do not rule out co-infections with other pathogens, and should not be used as  the sole basis for treatment or other patient management decisions. Negative results must be combined with clinical observations, patient history, and epidemiological information. The expected result is Negative. Fact Sheet for Patients: SugarRoll.be Fact Sheet for Healthcare Providers: https://www.woods-mathews.com/ This test is not yet approved or cleared by the [place] FDA and  has been authorized for detection and/or diagnosis of SARS-CoV-2 by FDA under an Emergency Use Authorization (EUA). This EUA will remain  in effect (meaning this test can be used) for the duration of the COVID-19 declaration under Section 56 4(b)(1) of the Act, 21 U.S.C. section 360bbb-3(b)(1), unless the authorization is terminated or revoked sooner. Performed at [facility], [place] [address], [place], [place] [address]      PHQ2/9: Depression screen [facility] 2/9 12/25/2019 11/17/2019 09/22/2019  09/03/2019 08/14/2019  Decreased Interest 0 0 0 0 0  Down, Depressed, Hopeless 0 0 0 0 0  PHQ - 2 Score 0 0 0 0 0  Altered sleeping 0 - 0 0 0  Tired, decreased energy 0 - 0 0 1  Change in appetite 0 - 0 0 0  Feeling bad or failure about yourself  0 - 0 0 0  Trouble concentrating 0 - 0 0 0  Moving slowly or fidgety/restless 0 - 0 0 0  Suicidal thoughts 0 - 0 0 0  PHQ-9 Score 0 - 0 0 1  Difficult doing work/chores - - - Not difficult at all Not difficult at all  Some recent data might be hidden    phq 9 is negative   Fall Risk: Fall Risk  12/25/2019 11/17/2019 09/22/2019 09/03/2019 08/14/2019  Falls in the past year? 1 1 1  0 1  Number falls in past yr: 0 1 0 0 0  Injury with Fall? 0 1 1 0 0  Risk for fall due to : - History of fall(s) - - -  Follow up - Falls prevention discussed - - -     Functional Status Survey: Is the patient deaf or have difficulty hearing?: No Does the patient have difficulty seeing, even when wearing glasses/contacts?: No Does the patient  have difficulty concentrating, remembering, or making decisions?: No Does the patient have difficulty walking or climbing stairs?: No Does the patient have difficulty dressing or bathing?: No Does the patient have difficulty doing errands alone such as visiting a doctor's office or shopping?: No   Assessment & Plan  1. Essential hypertension  We will change from lopressor to metoprolol since only taking one a day  - metoprolol succinate (TOPROL-XL) 25 MG 24 hr tablet; Take 0.5 tablets (12.5 mg total) by mouth daily. In place of lopressor  Dispense: 45 tablet; Refill: 1  2. Tortuous aorta (HCC)   3. Fibrosis of lung ([place])  Seen on CT, doing well   4. Senile purpura (HCC)  Stable on both arms, likely from aspirin   5. Thoracic aortic aneurysm without rupture [facility])  - Ambulatory referral to Vascular Surgery  6. Peripheral polyneuropathy   7. Aortic atherosclerosis (HCC)  On statin and aspirin   8. Edema of right lower extremity  - Ambulatory referral to Vascular Surgery  9. Gastroesophageal reflux disease  - pantoprazole (PROTONIX) 40 MG tablet; Take 1 tablet (40 mg total) by mouth daily.  Dispense: 90 tablet; Refill: 0  10. Perennial allergic rhinitis   11. Pure hypercholesterolemia  - atorvastatin (LIPITOR) 10 MG tablet; Take 1 tablet (10 mg total) by mouth daily.  Dispense: 90 tablet; Refill: 1  12. Bilateral carotid artery stenosis   13. CHF (congestive heart failure), NYHA class III, chronic, systolic (HCC)  Stable on baby aspirin he is off diuretics , still on beta blocker Discussed ACE but he does not want any more medications  14. Atherosclerosis of aorta (HCC)  - atorvastatin (LIPITOR) 10 MG tablet; Take 1 tablet (10 mg total) by mouth daily.  Dispense: 90 tablet; Refill: 1  15. History of syncope   16. Double vision  He wears glasses with prisms, it was secondary to strabismus

## 2019-12-25 NOTE — Addendum Note (Signed)
Addended by: Steele Sizer F on: 12/25/2019 10:11 AM   Modules accepted: Orders

## 2019-12-26 LAB — CBC WITH DIFFERENTIAL/PLATELET
Absolute Monocytes: 839 cells/uL (ref 200–950)
Basophils Absolute: 31 cells/uL (ref 0–200)
Basophils Relative: 0.4 %
Eosinophils Absolute: 169 cells/uL (ref 15–500)
Eosinophils Relative: 2.2 %
HCT: 42.4 % (ref 38.5–50.0)
Hemoglobin: 13.8 g/dL (ref 13.2–17.1)
Lymphs Abs: 2633 cells/uL (ref 850–3900)
MCH: 31.6 pg (ref 27.0–33.0)
MCHC: 32.5 g/dL (ref 32.0–36.0)
MCV: 97 fL (ref 80.0–100.0)
MPV: 9.9 fL (ref 7.5–12.5)
Monocytes Relative: 10.9 %
Neutro Abs: 4027 cells/uL (ref 1500–7800)
Neutrophils Relative %: 52.3 %
Platelets: 222 10*3/uL (ref 140–400)
RBC: 4.37 10*6/uL (ref 4.20–5.80)
RDW: 11.4 % (ref 11.0–15.0)
Total Lymphocyte: 34.2 %
WBC: 7.7 10*3/uL (ref 3.8–10.8)

## 2019-12-26 LAB — COMPLETE METABOLIC PANEL WITH GFR
AG Ratio: 1.6 (calc) (ref 1.0–2.5)
ALT: 10 U/L (ref 9–46)
AST: 17 U/L (ref 10–35)
Albumin: 3.9 g/dL (ref 3.6–5.1)
Alkaline phosphatase (APISO): 63 U/L (ref 35–144)
BUN: 22 mg/dL (ref 7–25)
CO2: 26 mmol/L (ref 20–32)
Calcium: 8.9 mg/dL (ref 8.6–10.3)
Chloride: 107 mmol/L (ref 98–110)
Creat: 1.03 mg/dL (ref 0.70–1.11)
GFR, Est African American: 74 mL/min/{1.73_m2} (ref >=60)
GFR, Est Non African American: 64 mL/min/{1.73_m2} (ref >=60)
Globulin: 2.5 g/dL (calc) (ref 1.9–3.7)
Glucose, Bld: 101 mg/dL — ABNORMAL HIGH (ref 65–99)
Potassium: 4.6 mmol/L (ref 3.5–5.3)
Sodium: 140 mmol/L (ref 135–146)
Total Bilirubin: 0.4 mg/dL (ref 0.2–1.2)
Total Protein: 6.4 g/dL (ref 6.1–8.1)

## 2019-12-26 LAB — HEMOGLOBIN A1C
Hgb A1c MFr Bld: 5.5 % of total Hgb (ref <5.7)
Mean Plasma Glucose: 111 (calc)
eAG (mmol/L): 6.2 (calc)

## 2019-12-26 LAB — LIPID PANEL
Cholesterol: 121 mg/dL (ref <200)
HDL: 40 mg/dL (ref >=40)
LDL Cholesterol (Calc): 66 mg/dL (calc)
Non-HDL Cholesterol (Calc): 81 mg/dL (calc) (ref <130)
Total CHOL/HDL Ratio: 3 (calc) (ref <5.0)
Triglycerides: 73 mg/dL (ref <150)

## 2020-01-14 ENCOUNTER — Encounter (INDEPENDENT_AMBULATORY_CARE_PROVIDER_SITE_OTHER): Payer: Self-pay | Admitting: Vascular Surgery

## 2020-01-14 ENCOUNTER — Ambulatory Visit (INDEPENDENT_AMBULATORY_CARE_PROVIDER_SITE_OTHER): Payer: Medicare HMO | Admitting: Vascular Surgery

## 2020-01-14 ENCOUNTER — Other Ambulatory Visit: Payer: Self-pay

## 2020-01-14 VITALS — BP 111/53 | HR 56 | Resp 16 | Wt 188.4 lb

## 2020-01-14 DIAGNOSIS — I700 Atherosclerosis of aorta: Secondary | ICD-10-CM | POA: Diagnosis not present

## 2020-01-14 DIAGNOSIS — I2510 Atherosclerotic heart disease of native coronary artery without angina pectoris: Secondary | ICD-10-CM

## 2020-01-14 DIAGNOSIS — I10 Essential (primary) hypertension: Secondary | ICD-10-CM

## 2020-01-14 DIAGNOSIS — I712 Thoracic aortic aneurysm, without rupture, unspecified: Secondary | ICD-10-CM

## 2020-01-14 DIAGNOSIS — I890 Lymphedema, not elsewhere classified: Secondary | ICD-10-CM | POA: Diagnosis not present

## 2020-01-14 DIAGNOSIS — Z0100 Encounter for examination of eyes and vision without abnormal findings: Secondary | ICD-10-CM | POA: Diagnosis not present

## 2020-01-17 ENCOUNTER — Encounter (INDEPENDENT_AMBULATORY_CARE_PROVIDER_SITE_OTHER): Payer: Self-pay | Admitting: Vascular Surgery

## 2020-01-17 NOTE — Progress Notes (Signed)
MRN : [id number]  Mark [name] is a 86 y.o. ([birth year]) male who presents with chief complaint of  Chief Complaint  Patient presents with  . Follow-up    ref [place] restablish edema,thoracic aneurysm  .  History of Present Illness:   The patient presents to the office for evaluation of an ascending thoracic aortic aneurysm. The aneurysm was found incidentally by CT scan. Patient denies chest pain or unusual back pain, no other abdominal complaints.  No history of an acute onset of painful blue discoloration of the toes.     No family history of TAA/AAA.   The patient is also followed for leg swelling.  The swelling has improved quite a bit and the pain associated with swelling has decreased substantially. There have not been any interval development of a ulcerations or wounds.  Since the previous visit the patient has been wearing graduated compression stockings and has noted little significant improvement in the lymphedema. The patient has been using compression routinely morning until night.  The patient also states elevation during the day and exercise is being done too.  Patient denies amaurosis fugax or TIA symptoms. There is no history of claudication or rest pain symptoms of the lower extremities.  The patient denies angina or shortness of breath.  CT scan shows an ascending TAA that measures about 4 cm cm  Current Meds  Medication Sig  . acetaminophen (TYLENOL) 500 MG tablet Take 500 mg by mouth every 6 (six) hours as needed.  Marland Kitchen albuterol (VENTOLIN HFA) 108 (90 Base) MCG/ACT inhaler Inhale 2 puffs into the lungs every 6 (six) hours as needed for wheezing or shortness of breath.  Marland Kitchen aspirin EC 81 MG tablet Take 81 mg by mouth daily.   Marland Kitchen atorvastatin (LIPITOR) 10 MG tablet Take 1 tablet (10 mg total) by mouth daily.  Marland Kitchen gabapentin (NEURONTIN) 400 MG capsule Take 1 capsule (400 mg total) by mouth 3 (three) times daily. (Patient taking differently: Take 400 mg by mouth 3  (three) times daily. Pt taking twice daily)  . Loratadine 10 MG CAPS Take 10 mg by mouth daily.   . metoprolol succinate (TOPROL-XL) 25 MG 24 hr tablet Take 0.5 tablets (12.5 mg total) by mouth daily. In place of lopressor  . montelukast (SINGULAIR) 10 MG tablet TAKE 1 TABLET AT BEDTIME  . pantoprazole (PROTONIX) 40 MG tablet Take 1 tablet (40 mg total) by mouth daily.  . Probiotic Product (PROBIOTIC PO) Take by mouth daily.   . tamsulosin (FLOMAX) 0.4 MG CAPS capsule Take 1 capsule (0.4 mg total) by mouth daily.    Past Medical History:  Diagnosis Date  . Arthritis   . Carotid artery disease ([place])   . Coronary artery disease 2005  . COVID-19 09/01/2019   symptoms resolved 09/05/19  . GERD (gastroesophageal reflux disease)   . Hyperkalemia   . Hyperlipidemia   . Hypertension   . Past heart attack 2005    Past Surgical History:  Procedure Laterality Date  . CATARACT EXTRACTION W/PHACO Right 11/24/2019   Procedure: CATARACT EXTRACTION PHACO AND INTRAOCULAR LENS PLACEMENT (IOC) RIGHT MALYUGIN 8.59 00:50.8;  Surgeon: Birder Robson, MD;  Location: [facility];  Service: Ophthalmology;  Laterality: Right;  COVID ( + ) 09-01-19  . CATARACT EXTRACTION W/PHACO Left 12/15/2019   Procedure: CATARACT EXTRACTION PHACO AND INTRAOCULAR LENS PLACEMENT (IOC) LEFT 9.90  01:03.7;  Surgeon: Birder Robson, MD;  Location: [facility];  Service: Ophthalmology;  Laterality: Left;  . CORONARY  ANGIOPLASTY WITH STENT PLACEMENT  2005   [facility]    Social History Social History   Tobacco Use  . Smoking status: Former Smoker    Quit date: 05/16/1963    Years since quitting: 56.7  . Smokeless tobacco: Never Used  . Tobacco comment: quit over 50 years ago  Vaping Use  . Vaping Use: Never used  Substance Use Topics  . Alcohol use: No  . Drug use: No    Family History Family History  Problem Relation Age of Onset  . Hypertension Mother   . Heart disease Mother   . Heart disease  Father   . Hypertension Father     Allergies  Allergen Reactions  . Breo Ellipta [Fluticasone Furoate-Vilanterol] Rash    Patient stated that it made his mouth break out  . Rosuvastatin Rash and Other (See Comments)    No reaction per pt, He saw some information regarding Crestor and adverse effects      REVIEW OF SYSTEMS (Negative unless checked)  Constitutional: [] Weight loss  [] Fever  [] Chills Cardiac: [] Chest pain   [] Chest pressure   [] Palpitations   [] Shortness of breath when laying flat   [] Shortness of breath with exertion. Vascular:  [] Pain in legs with walking   [x] Pain in legs at rest  [] History of DVT   [] Phlebitis   [x] Swelling in legs   [] Varicose veins   [] Non-healing ulcers Pulmonary:   [] Uses home oxygen   [] Productive cough   [] Hemoptysis   [] Wheeze  [] COPD   [] Asthma Neurologic:  [] Dizziness   [] Seizures   [] History of stroke   [] History of TIA  [] Aphasia   [] Vissual changes   [] Weakness or numbness in arm   [] Weakness or numbness in leg Musculoskeletal:   [] Joint swelling   [] Joint pain   [] Low back pain Hematologic:  [] Easy bruising  [] Easy bleeding   [] Hypercoagulable state   [] Anemic Gastrointestinal:  [] Diarrhea   [] Vomiting  [x] Gastroesophageal reflux/heartburn   [] Difficulty swallowing. Genitourinary:  [] Chronic kidney disease   [] Difficult urination  [] Frequent urination   [] Blood in urine Skin:  [] Rashes   [] Ulcers  Psychological:  [] History of anxiety   []  History of major depression.  Physical Examination  Vitals:   01/14/20 1439  BP: (!) 111/53  Pulse: (!) 56  Resp: 16  Weight: 188 lb 6.4 oz (85.5 kg)   Body mass index is 28.65 kg/m. Gen: WD/WN, NAD Head: [place]/AT, No temporalis wasting.  Ear/Nose/Throat: Hearing grossly intact, nares w/o erythema or drainage Eyes: PER, EOMI, sclera nonicteric.  Neck: Supple, no large masses.   Pulmonary:  Good air movement, no audible wheezing bilaterally, no use of accessory muscles.  Cardiac: RRR, no  JVD Vascular: scattered varicosities present bilaterally.  Mild venous stasis changes to the legs bilaterally.  2+ soft pitting edema Vessel Right Left  Radial Palpable Palpable  Gastrointestinal: Non-distended. No guarding/no peritoneal signs.  Musculoskeletal: M/S 5/5 throughout.  No deformity or atrophy.  Neurologic: CN 2-12 intact. Symmetrical.  Speech is fluent. Motor exam as listed above. Psychiatric: Judgment intact, Mood & affect appropriate for pt's clinical situation. Dermatologic: No rashes or ulcers noted.  No changes consistent with cellulitis.   CBC Lab Results  Component Value Date   WBC 7.7 12/25/2019   HGB 13.8 12/25/2019   HCT 42.4 12/25/2019   MCV 97.0 12/25/2019   PLT 222 12/25/2019    BMET    Component Value Date/Time   NA 140 12/25/2019 1013   NA 140 08/05/2015 1233  K 4.6 12/25/2019 1013   CL 107 12/25/2019 1013   CO2 26 12/25/2019 1013   GLUCOSE 101 (H) 12/25/2019 1013   BUN 22 12/25/2019 1013   BUN 15 08/05/2015 1233   CREATININE 1.03 12/25/2019 1013   CALCIUM 8.9 12/25/2019 1013   GFRNONAA 64 12/25/2019 1013   GFRAA 74 12/25/2019 1013   CrCl cannot be calculated (Patient's most recent lab result is older than the maximum 21 days allowed.).  COAG No results found for: INR, PROTIME  Radiology No results found.   Assessment/Plan 1. Thoracic aortic aneurysm without rupture ([place]) No surgery or intervention at this time. The patient has an asymptomatic thoracic aortic aneurysm that is less than 5 cm in maximal diameter.  I have discussed the natural history of abdominal aortic aneurysm and the small risk of rupture for aneurysm less than 5 cm in size.  However, as these small aneurysms tend to enlarge over time, continued surveillance with CT scan is mandatory.  I have also discussed optimizing medical management with hypertension and lipid control and the importance of abstinence from tobacco.  The patient is also encouraged to exercise a  minimum of 30 minutes 4 times a week.  Should the patient develop new onset chest, abdominal or back pain or signs of peripheral embolization they are instructed to seek medical attention immediately and to alert the physician providing care that they have an aneurysm.  The patient voices their understanding. The patient will return in 12 months with an aortic duplex.   2. Atherosclerosis of aorta (HCC)  Recommend:  The patient has evidence of atherosclerosis of the lower extremities with claudication.  The patient does not voice lifestyle limiting changes at this point in time.  Noninvasive studies do not suggest clinically significant change.  No invasive studies, angiography or surgery at this time The patient should continue walking and begin a more formal exercise program.  The patient should continue antiplatelet therapy and aggressive treatment of the lipid abnormalities  No changes in the patient's medications at this time  The patient should continue wearing graduated compression socks 10-15 mmHg strength to control the mild edema.    3. Lymphedema No surgery or intervention at this point in time.    I have reviewed my discussion with the patient regarding venous insufficiency and secondary lymph edema and why it  causes symptoms. I have discussed with the patient the chronic skin changes that accompany these problems and the long term sequela such as ulceration and infection.  Patient will continue wearing graduated compression stockings class 1 (20-30 mmHg) on a daily basis a prescription was given to the patient to keep this updated. The patient will  put the stockings on first thing in the morning and removing them in the evening. The patient is instructed specifically not to sleep in the stockings.  In addition, behavioral modification including elevation during the day will be continued.  Diet and salt restriction was also discussed.  Previous duplex ultrasound of the lower  extremities shows normal deep venous system, superficial reflux was not present.   Following the review of the ultrasound the patient will follow up in 12 months to reassess the degree of swelling and the control that graduated compression is offering.   The patient can be assessed for a Lymph Pump at that time.  However, at this time the patient states they are satisfied with the control compression and elevation is yielding.    4. Arteriosclerosis of coronary artery Continue cardiac  and antihypertensive medications as already ordered and reviewed, no changes at this time.  Continue statin as ordered and reviewed, no changes at this time  Nitrates PRN for chest pain   5. Benign essential HTN Continue antihypertensive medications as already ordered, these medications have been reviewed and there are no changes at this time.     Hortencia Pilar, MD  01/17/2020 10:54 AM

## 2020-02-08 ENCOUNTER — Ambulatory Visit: Payer: Medicare HMO | Admitting: Dermatology

## 2020-02-12 ENCOUNTER — Ambulatory Visit: Payer: PPO | Admitting: Family Medicine

## 2020-03-04 ENCOUNTER — Other Ambulatory Visit: Payer: Self-pay | Admitting: Family Medicine

## 2020-03-04 MED ORDER — ALBUTEROL SULFATE HFA 108 (90 BASE) MCG/ACT IN AERS: 2.0000 | INHALATION_SPRAY | Freq: Four times a day (QID) | RESPIRATORY_TRACT | 1 refills | Status: DC | PRN

## 2020-03-04 NOTE — Telephone Encounter (Signed)
Medication Refill - Medication: albuterol (VENTOLIN HFA) 108 (90 Base) MCG/ACT inhaler    Has the patient contacted their pharmacy?yes (Agent: If no, request that the patient contact the pharmacy for the refill.) (Agent: If yes, when and what did the pharmacy advise?)contact PCP  Preferred Pharmacy (with phone number or street name):  [facility] [place], [place] Phone:  [phone]  Fax:  [phone]       Agent: Please be advised that RX refills may take up to 3 business days. We ask that you follow-up with your pharmacy.

## 2020-04-15 ENCOUNTER — Other Ambulatory Visit: Payer: Self-pay | Admitting: Family Medicine

## 2020-04-15 DIAGNOSIS — K219 Gastro-esophageal reflux disease without esophagitis: Secondary | ICD-10-CM

## 2020-04-26 DIAGNOSIS — E7800 Pure hypercholesterolemia, unspecified: Secondary | ICD-10-CM | POA: Diagnosis not present

## 2020-04-26 DIAGNOSIS — I6523 Occlusion and stenosis of bilateral carotid arteries: Secondary | ICD-10-CM | POA: Diagnosis not present

## 2020-04-26 DIAGNOSIS — I2510 Atherosclerotic heart disease of native coronary artery without angina pectoris: Secondary | ICD-10-CM | POA: Diagnosis not present

## 2020-04-26 DIAGNOSIS — I255 Ischemic cardiomyopathy: Secondary | ICD-10-CM | POA: Diagnosis not present

## 2020-04-26 DIAGNOSIS — I10 Essential (primary) hypertension: Secondary | ICD-10-CM | POA: Diagnosis not present

## 2020-04-26 DIAGNOSIS — R0602 Shortness of breath: Secondary | ICD-10-CM | POA: Diagnosis not present

## 2020-04-26 DIAGNOSIS — Z23 Encounter for immunization: Secondary | ICD-10-CM | POA: Diagnosis not present

## 2020-04-27 ENCOUNTER — Other Ambulatory Visit: Payer: Self-pay | Admitting: Family Medicine

## 2020-04-27 DIAGNOSIS — I10 Essential (primary) hypertension: Secondary | ICD-10-CM

## 2020-05-04 ENCOUNTER — Other Ambulatory Visit: Payer: Self-pay | Admitting: Family Medicine

## 2020-05-04 DIAGNOSIS — J3089 Other allergic rhinitis: Secondary | ICD-10-CM

## 2020-05-05 ENCOUNTER — Other Ambulatory Visit: Payer: Self-pay | Admitting: Dermatology

## 2020-05-16 ENCOUNTER — Other Ambulatory Visit: Payer: Self-pay | Admitting: Family Medicine

## 2020-05-16 ENCOUNTER — Telehealth: Payer: Self-pay | Admitting: Family Medicine

## 2020-05-16 DIAGNOSIS — G629 Polyneuropathy, unspecified: Secondary | ICD-10-CM

## 2020-05-16 NOTE — Telephone Encounter (Signed)
Patient called to get an ear medication that he was taking in 2018.  It is no longer on his med. List.  CB# [phone]

## 2020-05-18 ENCOUNTER — Encounter: Payer: Self-pay | Admitting: Dermatology

## 2020-05-18 ENCOUNTER — Other Ambulatory Visit: Payer: Self-pay

## 2020-05-18 ENCOUNTER — Ambulatory Visit: Payer: Medicare HMO | Admitting: Dermatology

## 2020-05-18 DIAGNOSIS — L578 Other skin changes due to chronic exposure to nonionizing radiation: Secondary | ICD-10-CM

## 2020-05-18 DIAGNOSIS — L719 Rosacea, unspecified: Secondary | ICD-10-CM | POA: Diagnosis not present

## 2020-05-18 DIAGNOSIS — L820 Inflamed seborrheic keratosis: Secondary | ICD-10-CM | POA: Diagnosis not present

## 2020-05-18 DIAGNOSIS — D229 Melanocytic nevi, unspecified: Secondary | ICD-10-CM | POA: Diagnosis not present

## 2020-05-18 DIAGNOSIS — Z85828 Personal history of other malignant neoplasm of skin: Secondary | ICD-10-CM | POA: Diagnosis not present

## 2020-05-18 DIAGNOSIS — L821 Other seborrheic keratosis: Secondary | ICD-10-CM | POA: Diagnosis not present

## 2020-05-18 DIAGNOSIS — D1800 Hemangioma unspecified site: Secondary | ICD-10-CM | POA: Diagnosis not present

## 2020-05-18 DIAGNOSIS — L814 Other melanin hyperpigmentation: Secondary | ICD-10-CM | POA: Diagnosis not present

## 2020-05-18 DIAGNOSIS — Z1283 Encounter for screening for malignant neoplasm of skin: Secondary | ICD-10-CM

## 2020-05-18 DIAGNOSIS — L570 Actinic keratosis: Secondary | ICD-10-CM

## 2020-05-18 NOTE — Progress Notes (Signed)
   Follow-Up Visit   Subjective  Mark [name] is a 86 y.o. male who presents for the following: Annual Exam (Hx SCC ). The patient presents for Total-Body Skin Exam (TBSE) for skin cancer screening and mole check.  The following portions of the chart were reviewed this encounter and updated as appropriate:  Tobacco  Allergies  Meds  Problems  Med Hx  Surg Hx  Fam Hx     Review of Systems:  No other skin or systemic complaints except as noted in HPI or Assessment and Plan.  Objective  Well appearing patient in no apparent distress; mood and affect are within normal limits.  A full examination was performed including scalp, head, eyes, ears, nose, lips, neck, chest, axillae, abdomen, back, buttocks, bilateral upper extremities, bilateral lower extremities, hands, feet, fingers, toes, fingernails, and toenails. All findings within normal limits unless otherwise noted below.  Objective  Face and scalp: Papules on the nose and cheeks.  Objective  Scalp and ears (20): Erythematous thin papules/macules with gritty scale.   Objective  Back x 5: Erythematous keratotic or waxy stuck-on papule or plaque.   Assessment & Plan  Rosacea Face and scalp  Start Metronidazole 0.75% gel QHS to the cheeks and nose (patient has at home). Continue to use daily on the scalp.   AK (actinic keratosis) (20) Scalp and ears  Destruction of lesion - Scalp and ears Complexity: simple   Destruction method: cryotherapy   Informed consent: discussed and consent obtained   Timeout:  patient name, date of birth, surgical site, and procedure verified Lesion destroyed using liquid nitrogen: Yes   Region frozen until ice ball extended beyond lesion: Yes   Outcome: patient tolerated procedure well with no complications   Post-procedure details: wound care instructions given    Inflamed seborrheic keratosis Back x 5  Destruction of lesion - Back x 5 Complexity: simple   Destruction method:  cryotherapy   Informed consent: discussed and consent obtained   Timeout:  patient name, date of birth, surgical site, and procedure verified Lesion destroyed using liquid nitrogen: Yes   Region frozen until ice ball extended beyond lesion: Yes   Outcome: patient tolerated procedure well with no complications   Post-procedure details: wound care instructions given     Lentigines - Scattered tan macules - Discussed due to sun exposure - Benign, observe - Call for any changes  Seborrheic Keratoses - Stuck-on, waxy, tan-brown papules and plaques  - Discussed benign etiology and prognosis. - Observe - Call for any changes  Melanocytic Nevi - Tan-brown and/or pink-flesh-colored symmetric macules and papules - Benign appearing on exam today - Observation - Call clinic for new or changing moles - Recommend daily use of broad spectrum spf 30+ sunscreen to sun-exposed areas.   Hemangiomas - Red papules - Discussed benign nature - Observe - Call for any changes  Actinic Damage - diffuse scaly erythematous macules with underlying dyspigmentation - Recommend daily broad spectrum sunscreen SPF 30+ to sun-exposed areas, reapply every 2 hours as needed.  - Call for new or changing lesions.  Skin cancer screening performed today.  Return in about 4 months (around 09/18/2020) for AK and rosacea follow up .  Luther Redo, CMA, am acting as scribe for Sarina Ser, MD .  Documentation: I have reviewed the above documentation for accuracy and completeness, and I agree with the above.  Sarina Ser, MD

## 2020-05-19 ENCOUNTER — Encounter: Payer: Self-pay | Admitting: Dermatology

## 2020-05-26 ENCOUNTER — Other Ambulatory Visit: Payer: Self-pay

## 2020-05-26 DIAGNOSIS — J3089 Other allergic rhinitis: Secondary | ICD-10-CM

## 2020-05-26 DIAGNOSIS — E7800 Pure hypercholesterolemia, unspecified: Secondary | ICD-10-CM

## 2020-05-26 DIAGNOSIS — K219 Gastro-esophageal reflux disease without esophagitis: Secondary | ICD-10-CM

## 2020-05-26 DIAGNOSIS — I700 Atherosclerosis of aorta: Secondary | ICD-10-CM

## 2020-05-27 MED ORDER — ALBUTEROL SULFATE HFA 108 (90 BASE) MCG/ACT IN AERS: 2.0000 | INHALATION_SPRAY | Freq: Four times a day (QID) | RESPIRATORY_TRACT | 1 refills | Status: DC | PRN

## 2020-05-27 MED ORDER — MONTELUKAST SODIUM 10 MG PO TABS: 10.0000 mg | ORAL_TABLET | Freq: Every day | ORAL | 1 refills | Status: DC

## 2020-05-27 MED ORDER — ATORVASTATIN CALCIUM 10 MG PO TABS: 10.0000 mg | ORAL_TABLET | Freq: Every day | ORAL | 1 refills | Status: DC

## 2020-05-27 MED ORDER — PANTOPRAZOLE SODIUM 40 MG PO TBEC: 40.0000 mg | DELAYED_RELEASE_TABLET | Freq: Every day | ORAL | 0 refills | Status: DC

## 2020-05-30 ENCOUNTER — Other Ambulatory Visit: Payer: Self-pay

## 2020-05-30 ENCOUNTER — Telehealth: Payer: Self-pay

## 2020-05-30 NOTE — Telephone Encounter (Signed)
Copied from [place] [phone]. Topic: General - Inquiry >> May 30, 2020  2:08 PM Scherrie Gerlach wrote: Reason for CRM: pt states he had shingles on his right thigh.  It is drying.  It has been abot a week.  Pt wants to know si he needs any medication or a shot?

## 2020-05-31 ENCOUNTER — Telehealth: Payer: Self-pay

## 2020-06-27 NOTE — Progress Notes (Signed)
Name: Mark [name]   MRN: [id number]    DOB: 1929-02-16   Date:06/28/2020       Progress Note  Subjective  Chief Complaint  Chief Complaint  Patient presents with  . Follow-up    HPI   CHF: he is under the care of Dr. Nehemiah Massed. He had syncope earlier in 2021 seen by Dr. Scarlette Ar, on lower dose of lopressor but only taking it once daily, he states has dizziness when he gets up quickly a couple of times a month. He was at [facility] living for 6 months, but moved back June 1st 2020 because he likes doing things around the house. He has SOB with moderate activity ( like going up the stairs)  , he denies orthopnea. He status post STEMI and PCI with stent to mid LAD in 2005 . He had repeat Echo and Stress Myoview, previous Echo was 40 % and is down to 20-30 % , he states stable, continues to have right lower extremity edema worse than left, he saw Dr Erven Colla this Summer and was given reassurance - lymphedema. . He does not like compression stocking hoses   Last Echo was done 10/13/2019   INTERPRETATION SEVERE LV SYSTOLIC DYSFUNCTION (See above)   WITH MILD LVH NORMAL RIGHT VENTRICULAR SYSTOLIC FUNCTION MILD VALVULAR REGURGITATION (See above) NO VALVULAR STENOSIS MILD AR, MR, TR EF 25-30%  Last Myoview stress test 10/13/2019  Regional wall motion: demonstrates akinesis of the Apical and distal  inferior myocardium. The overall quality of the study is good.  Artifacts noted: yes  Left ventricular cavity: enlarged.  Perfusion Analysis: SPECT images demonstrate large perfusion abnormality  of severe intensity is present in the apical myocardial region. There is  a moderate perfusion abnormality of moderate intensity of the inferior  myocardial region on the stress images. Resting images show unchanged  abnormal perfusion abnormalities of stress portion. This suggests no  evidence of myocardial ischemia and previous myocardial infarction.  Defect type:  Fixed   GERD: taking Pantoprazole and symptoms are controlled at this time, no heartburn or regurgitation.  BPH: doing well on Flomax, he states no longer having to void at night.   Lung nodules/pulmonary fibrosis : seen by Dr. Mortimer Fries. He states he does not want repeat studies, last study was 01/2019. CT chest done 2019 showed some pulmonary fibrosis, he used to smoke but not currently , he quit smoking over 50 years ago and we will not recheck it now .No cough or clearing throat anymore doing well on singulair and loratadine , sometimes uses Ventolin when active outside, no problems when inside   AR: denies sneezing, rhinorrhea or cough . He states no longer using nasal spray He is on singulair , unchanged   Atherosclerosis aorta/Aneurysm of thoracic aorta: discussed results with patient, he is not interested in changing management , also discussed aneurysm of aorta, guidelines recommends repeat US in 2024 . He is still taking Atorvastatin , he is under the care of Dr. Erven Colla   History of skin cancer: under the care of Dr. Erin Fulling, no new lesions. Unchanged   DDDlumbar spine and radiculitis: he has numbness and tingling on right leg only and it affects his walk at times, occasionally numbness on left lower leg. On gabapentin and tolerates medication well, only taking it twice a day and symptoms are controlled , states not bothered by back pain or paresthesias at this time. Unchanged   OA: both knees and hand, needs refill of Voltaren gel  Rash: he has contact dermatitis of left arm, from working outside in the woods, he has been using rubbing alcohol, discussed topical medication Patient Active Problem List   Diagnosis Date Noted  . Cardiac syncope 09/22/2019  . Respiratory tract infection due to COVID-19 virus 09/04/2019  . Fibrosis of lung ([place]) 09/29/2018  . Senile purpura ([place]) 09/29/2018  . Atherosclerosis of aorta ([place]) 09/29/2018  . Hardening of the aorta (main artery of  the heart) ([place]) 09/29/2018  . Abnormal ECG 06/16/2018  . Thoracic aortic aneurysm without rupture ([place]) 05/28/2018  . CHF (congestive heart failure), NYHA class III, chronic, systolic ([place]) 79/89/2119  . 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 Ischemic cardiomyopathy 06/11/2017  . Benign essential HTN 05/22/2017  . Bilateral carotid artery stenosis 05/22/2017  . Neuropathy 01/28/2017  . Right leg paresthesias 11/26/2016  . Hypertension 08/27/2016  . GERD (gastroesophageal reflux disease) 08/27/2016  . Hyperglycemia 05/10/2016  . History of nonmelanoma skin cancer 10/10/2015  . Pain in right shoulder 09/13/2015  . Osteoarthritis 07/06/2015  . Lymphedema 05/30/2015  . Other specified soft tissue disorders 05/30/2015  . Hyperlipidemia 03/29/2015  . Family history of type 2 diabetes mellitus in father 03/29/2015  . DD (diverticular disease) 02/03/2015  . Diverticulosis of intestine without perforation or abscess without bleeding 02/03/2015  . DDD (degenerative disc disease), lumbar 03/03/2014  . Intervertebral disc disorder with radiculopathy of lumbar region 03/03/2014  . Lumbar radiculitis 03/03/2014  . Lumbar stenosis with neurogenic claudication 03/03/2014  . Arteriosclerosis of coronary artery 04/18/2012  . S/P coronary artery stent placement 04/18/2012  . Status post percutaneous transluminal coronary angioplasty 04/18/2012    Past Surgical History:  Procedure Laterality Date  . CATARACT EXTRACTION W/PHACO Right 11/24/2019   Procedure: CATARACT EXTRACTION PHACO AND INTRAOCULAR LENS PLACEMENT (IOC) RIGHT MALYUGIN 8.59 00:50.8;  Surgeon: Birder Robson, MD;  Location: [facility];  Service: Ophthalmology;  Laterality: Right;  COVID ( + ) 09-01-19  . CATARACT EXTRACTION W/PHACO Left 12/15/2019   Procedure: CATARACT EXTRACTION PHACO AND INTRAOCULAR LENS PLACEMENT (IOC) LEFT 9.90  01:03.7;  Surgeon: Birder Robson, MD;  Location: [facility];  Service: Ophthalmology;  Laterality: Left;  . CORONARY ANGIOPLASTY WITH STENT PLACEMENT  2005   [facility]    Family History  Problem Relation Age of Onset  . Hypertension Mother   . Heart disease Mother   . Heart disease Father   . Hypertension Father     Social History   Tobacco Use  . Smoking status: Former Smoker    Quit date: 05/16/1963    Years since quitting: 57.1  . Smokeless tobacco: Never Used  . Tobacco comment: quit over 50 years ago  Substance Use Topics  . Alcohol use: No     Current Outpatient Medications:  .  acetaminophen (TYLENOL) 500 MG tablet, Take 500 mg by mouth every 6 (six) hours as needed., Disp: , Rfl:  .  albuterol (VENTOLIN HFA) 108 (90 Base) MCG/ACT inhaler, Inhale 2 puffs into the lungs every 6 (six) hours as needed for wheezing or shortness of breath., Disp: 18 g, Rfl: 1 .  aspirin EC 81 MG tablet, Take 81 mg by mouth daily. , Disp: , Rfl:  .  atorvastatin (LIPITOR) 10 MG tablet, Take 1 tablet (10 mg total) by mouth daily., Disp: 90 tablet, Rfl: 1 .  gabapentin (NEURONTIN) 400 MG capsule, TAKE 1 CAPSULE THREE TIMES DAILY, Disp: 180 capsule, Rfl: 4 .  Loratadine 10 MG CAPS, Take 10 mg  by mouth daily. , Disp: , Rfl:  .  metoprolol succinate (TOPROL-XL) 25 MG 24 hr tablet, TAKE 1/2 TABLET EVERY DAY IN PLACE OF LOPRESSOR, Disp: 45 tablet, Rfl: 2 .  metroNIDAZOLE (METROGEL) 0.75 % gel, APPLY AT BEDTIME TO BUMPS ON SCALP., Disp: 45 g, Rfl: PRN .  montelukast (SINGULAIR) 10 MG tablet, Take 1 tablet (10 mg total) by mouth at bedtime., Disp: 90 tablet, Rfl: 1 .  pantoprazole (PROTONIX) 40 MG tablet, Take 1 tablet (40 mg total) by mouth daily., Disp: 90 tablet, Rfl: 0 .  Probiotic Product (PROBIOTIC PO), Take by mouth daily. , Disp: , Rfl:  .  tamsulosin (FLOMAX) 0.4 MG CAPS capsule, Take 1 capsule (0.4 mg total) by mouth daily., Disp: 90 capsule, Rfl: 3 .  diclofenac Sodium (VOLTAREN) 1 % GEL, Apply 4 g topically 4 (four) times daily., Disp: 300 g, Rfl:  0  Allergies  Allergen Reactions  . Gramineae Pollens Itching and Shortness Of Breath    Sneezing  . Cat Hair Extract Itching    Watery eyes and sneezing  . Breo Ellipta [Fluticasone Furoate-Vilanterol] Rash    Patient stated that it made his mouth break out  . Rosuvastatin Rash and Other (See Comments)    No reaction per pt, He saw some information regarding Crestor and adverse effects     I personally reviewed active problem list, medication list, allergies, family history, social history, health maintenance with the patient/caregiver today.   ROS  Constitutional: Negative for fever or weight change.  Respiratory:positive for cough and  shortness of breath.   Cardiovascular: Negative for chest pain or palpitations.  Gastrointestinal: Negative for abdominal pain, no bowel changes.  Musculoskeletal: Negative for gait problem or joint swelling.  Skin: Negative for rash.  Neurological: Negative for dizziness or headache.  No other specific complaints in a complete review of systems (except as listed in HPI above).  Objective  Vitals:   06/28/20 1002  BP: (!) 98/50  Pulse: 65  Resp: 16  Temp: (!) 97.3 F (36.3 C)  TempSrc: Oral  SpO2: 98%  Weight: 190 lb (86.2 kg)  Height: 5\' 11"  (1.803 m)    Body mass index is 26.5 kg/m.  Physical Exam  Constitutional: Patient appears well-developed and well-nourished.  No distress.  HEENT: head atraumatic, normocephalic, pupils equal and reactive to light,  neck supple Cardiovascular: Normal rate, regular rhythm and normal heart sounds.  No murmur heard. No BLE edema. Pulmonary/Chest: Effort normal and breath sounds normal. No respiratory distress. Abdominal: Soft.  There is no tenderness. Skin: erythema on left medial arm, no oozing  Psychiatric: Patient has a normal mood and affect. behavior is normal. Judgment and thought content normal.  PHQ2/9: Depression screen [facility] 2/9 06/28/2020 12/25/2019 11/17/2019 09/22/2019 09/03/2019   Decreased Interest 0 0 0 0 0  Down, Depressed, Hopeless 0 0 0 0 0  PHQ - 2 Score 0 0 0 0 0  Altered sleeping - 0 - 0 0  Tired, decreased energy - 0 - 0 0  Change in appetite - 0 - 0 0  Feeling bad or failure about yourself  - 0 - 0 0  Trouble concentrating - 0 - 0 0  Moving slowly or fidgety/restless - 0 - 0 0  Suicidal thoughts - 0 - 0 0  PHQ-9 Score - 0 - 0 0  Difficult doing work/chores - - - - Not difficult at all  Some recent data might be hidden    phq 9 is  negative   Fall Risk: Fall Risk  06/28/2020 12/25/2019 11/17/2019 09/22/2019 09/03/2019  Falls in the past year? 0 1 1 1  0  Number falls in past yr: 0 0 1 0 0  Injury with Fall? 0 0 1 1 0  Risk for fall due to : - - History of fall(s) - -  Follow up - - Falls prevention discussed - -     Functional Status Survey: Is the patient deaf or have difficulty hearing?: Yes Does the patient have difficulty seeing, even when wearing glasses/contacts?: No Does the patient have difficulty concentrating, remembering, or making decisions?: Yes Does the patient have difficulty walking or climbing stairs?: Yes Does the patient have difficulty dressing or bathing?: No Does the patient have difficulty doing errands alone such as visiting a doctor's office or shopping?: No    Assessment & Plan  1. Gastroesophageal reflux disease without esophagitis  - pantoprazole (PROTONIX) 40 MG tablet; Take 1 tablet (40 mg total) by mouth daily.  Dispense: 90 tablet; Refill: 0  2. Senile purpura (HCC)  Stable , reassurance given   3. Fibrosis of lung ([place])   4. Tortuous aorta (HCC)   5. Essential hypertension  Low bp , he states usually only in am  6. Peripheral polyneuropathy   7. Aortic atherosclerosis ([place])   8. Perennial allergic rhinitis   9. CHF (congestive heart failure), NYHA class III, chronic, systolic (HCC)  Well controlled   10. BPH with obstruction/lower urinary tract symptoms  Take flomax at night   11.  Thoracic aortic aneurysm without rupture ([place])   12. Primary osteoarthritis of both knees  - diclofenac Sodium (VOLTAREN) 1 % GEL; Apply 4 g topically 4 (four) times daily.  Dispense: 300 g; Refill: 0  13. Irritant contact dermatitis due to oils  - triamcinolone (KENALOG) 0.1 %; Apply 1 application topically 2 (two) times daily.  Dispense: 45 g; Refill: 0

## 2020-06-28 ENCOUNTER — Ambulatory Visit (INDEPENDENT_AMBULATORY_CARE_PROVIDER_SITE_OTHER): Payer: Medicare HMO | Admitting: Family Medicine

## 2020-06-28 ENCOUNTER — Other Ambulatory Visit: Payer: Self-pay

## 2020-06-28 ENCOUNTER — Encounter: Payer: Self-pay | Admitting: Family Medicine

## 2020-06-28 VITALS — BP 98/50 | HR 65 | Temp 97.3°F | Resp 16 | Ht 71.0 in | Wt 190.0 lb

## 2020-06-28 DIAGNOSIS — N138 Other obstructive and reflux uropathy: Secondary | ICD-10-CM

## 2020-06-28 DIAGNOSIS — J3089 Other allergic rhinitis: Secondary | ICD-10-CM

## 2020-06-28 DIAGNOSIS — I712 Thoracic aortic aneurysm, without rupture, unspecified: Secondary | ICD-10-CM

## 2020-06-28 DIAGNOSIS — I10 Essential (primary) hypertension: Secondary | ICD-10-CM | POA: Diagnosis not present

## 2020-06-28 DIAGNOSIS — G629 Polyneuropathy, unspecified: Secondary | ICD-10-CM | POA: Diagnosis not present

## 2020-06-28 DIAGNOSIS — J8410 Pulmonary fibrosis, unspecified: Secondary | ICD-10-CM | POA: Diagnosis not present

## 2020-06-28 DIAGNOSIS — I771 Stricture of artery: Secondary | ICD-10-CM | POA: Diagnosis not present

## 2020-06-28 DIAGNOSIS — D692 Other nonthrombocytopenic purpura: Secondary | ICD-10-CM

## 2020-06-28 DIAGNOSIS — M170 Bilateral primary osteoarthritis of knee: Secondary | ICD-10-CM

## 2020-06-28 DIAGNOSIS — I5022 Chronic systolic (congestive) heart failure: Secondary | ICD-10-CM

## 2020-06-28 DIAGNOSIS — L241 Irritant contact dermatitis due to oils and greases: Secondary | ICD-10-CM

## 2020-06-28 DIAGNOSIS — K219 Gastro-esophageal reflux disease without esophagitis: Secondary | ICD-10-CM

## 2020-06-28 DIAGNOSIS — I700 Atherosclerosis of aorta: Secondary | ICD-10-CM

## 2020-06-28 DIAGNOSIS — N401 Enlarged prostate with lower urinary tract symptoms: Secondary | ICD-10-CM

## 2020-06-28 MED ORDER — DICLOFENAC SODIUM 1 % EX GEL: 4.0000 g | Freq: Four times a day (QID) | CUTANEOUS | 0 refills | Status: DC

## 2020-06-28 MED ORDER — TRIAMCINOLONE ACETONIDE 0.1 % EX CREA: 1.0000 "application " | TOPICAL_CREAM | Freq: Two times a day (BID) | CUTANEOUS | 0 refills | Status: DC

## 2020-06-28 MED ORDER — PANTOPRAZOLE SODIUM 40 MG PO TBEC: 40.0000 mg | DELAYED_RELEASE_TABLET | Freq: Every day | ORAL | 0 refills | Status: DC

## 2020-07-08 NOTE — Telephone Encounter (Signed)
completed

## 2020-07-11 ENCOUNTER — Other Ambulatory Visit: Payer: Self-pay | Admitting: Family Medicine

## 2020-07-11 DIAGNOSIS — M170 Bilateral primary osteoarthritis of knee: Secondary | ICD-10-CM

## 2020-07-13 DIAGNOSIS — H509 Unspecified strabismus: Secondary | ICD-10-CM | POA: Diagnosis not present

## 2020-07-18 ENCOUNTER — Other Ambulatory Visit: Payer: Self-pay | Admitting: Family Medicine

## 2020-07-18 ENCOUNTER — Ambulatory Visit: Payer: Self-pay

## 2020-07-18 ENCOUNTER — Other Ambulatory Visit: Payer: Medicare HMO

## 2020-07-18 DIAGNOSIS — Z20822 Contact with and (suspected) exposure to covid-19: Secondary | ICD-10-CM

## 2020-07-18 DIAGNOSIS — L241 Irritant contact dermatitis due to oils and greases: Secondary | ICD-10-CM

## 2020-07-18 NOTE — Telephone Encounter (Signed)
Pt received Flu and Moderna booster on Friday 07/15/20.On 07/16/20-pt stated he began to have  "flu like sx." Pt is experiencing a  sore throat, low grade fever to  101 on Saturday and has been afebrile since.  Cough is dry and having occasional "coughing fits."  Denies SOB at rest but is SOB with activity and per pt he stated the SOB is his baseline. Pt has h/o asthma and is using his inhaler as prescribed and gets relief.   No chest pain or pain with deep breathing.  Advised pt to get tested for Covid . Appt made for pt to get tested today in [place] pt to call back if inhaler is not as effective for his wheezing or if experiencing SOB at rest. Advised pt to call 911 for difficulty breathing. Advised getting a humidifier, drinking fluids and warm fluids, cough drops. Advised pt to quarantine and wait for test results. Care advice given and pt verbalized understanding. Sending to office to review.   Reason for Disposition . COVID-19 vaccine, systemic reactions (e.g., fatigue, fever, muscle aches), questions about    Covid testing appt made for pt in [place] today at noon.  Answer Assessment - Initial Assessment Questions 1. MAIN CONCERN OR SYMPTOM:  "What is your main concern right now?" "What question do you have?" "What's the main symptom you're worried about?" (e.g., fever, pain, redness, swelling)     Fever, cough, sore throat, nasal discharge and watery eyes 2. VACCINE: "What vaccination did you receive?" "Is this your first or second shot?" (e.g., none; AstraZeneca, J&J, [place], [place], other)    Moderna  3. SYMPTOM ONSET: "When did the sx begin?" (e.g., not relevant; hours, days)     07/16/20  4. SYMPTOM SEVERITY: "How bad is it?"      Coughing episodes, sore throat and h/o fever on Saturday 5. FEVER: "Is there a fever?" If Yes, ask: "What is it, how was it measured, and when did it start?"      No 97 today 6. PAST REACTIONS: "Have you reacted to immunizations before?" If Yes,  ask: "What happened?"    no 7. OTHER SYMPTOMS: "Do you have any other symptoms?"    Sore throat, cough, fever, wheezing.  Protocols used: CORONAVIRUS (COVID-19) VACCINE QUESTIONS AND REACTIONS-A-AH

## 2020-07-19 LAB — SARS-COV-2, NAA 2 DAY TAT

## 2020-07-19 LAB — NOVEL CORONAVIRUS, NAA: SARS-CoV-2, NAA: DETECTED — AB

## 2020-07-20 ENCOUNTER — Encounter: Payer: Self-pay | Admitting: Family Medicine

## 2020-07-20 ENCOUNTER — Telehealth: Payer: Self-pay

## 2020-07-20 ENCOUNTER — Other Ambulatory Visit: Payer: Self-pay

## 2020-07-20 ENCOUNTER — Ambulatory Visit (INDEPENDENT_AMBULATORY_CARE_PROVIDER_SITE_OTHER): Payer: Medicare HMO | Admitting: Family Medicine

## 2020-07-20 DIAGNOSIS — U071 COVID-19: Secondary | ICD-10-CM | POA: Diagnosis not present

## 2020-07-20 NOTE — Telephone Encounter (Signed)
Called to get pt scheduled for virtual for tomorrow. No answer no VM

## 2020-07-20 NOTE — Progress Notes (Signed)
Name: Mark JanJames K Looper   MRN: [id number]    DOB: [birth year]   Date:07/20/2020       Progress Note  Subjective  Chief Complaint  Positive COVID  I connected with  Mark [name] on 07/20/20 at  2:40 PM EST by telephone and verified that I am speaking with the correct person using two identifiers.  I discussed the limitations, risks, security and privacy concerns of performing an evaluation and management service by telephone and the availability of in person appointments. Staff also discussed with the patient that there may be a patient responsible charge related to this service. Patient agreed on having a virtual visit  Patient Location: at home  [facility] Additional Individuals present: son   HPI  COVID-19: he developed a cough and rhinorrhea, 5 days ago. The day that he received the flu and CVOID-19 third shot. He states he is feeling better. He denies fever or chills. He has mild decrease in appetite Denies diarrhea, nausea or vomiting. No lack of taste or smell. No headaches. He states he is taking some otc mucinex DM   Discussed that he is high risk and is a good candidate for monoclonal antibody infusion. He is not interested at this time  Advised him to monitor pulse ox at home ( he states his son will drop one off for him) and [facility] 92 %.   Stay hydrated, add vitamin C, D and zinc supplementation.   [facility]   Patient Active Problem List   Diagnosis Date Noted  . Cardiac syncope 09/22/2019  . Respiratory tract infection due to COVID-19 virus 09/04/2019  . Fibrosis of lung (HCC) 09/29/2018  . Senile purpura (HCC) 09/29/2018  . Atherosclerosis of aorta (HCC) 09/29/2018  . Hardening of the aorta (main artery of the heart) (HCC) 09/29/2018  . Abnormal ECG 06/16/2018  . Thoracic aortic aneurysm without rupture (HCC) 05/28/2018  . CHF (congestive heart failure), NYHA class III, chronic, systolic (HCC)  05/28/2018  . 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 Ischemic cardiomyopathy 06/11/2017  . Benign essential HTN 05/22/2017  . Bilateral carotid artery stenosis 05/22/2017  . Neuropathy 01/28/2017  . Right leg paresthesias 11/26/2016  . Hypertension 08/27/2016  . GERD (gastroesophageal reflux disease) 08/27/2016  . Hyperglycemia 05/10/2016  . History of nonmelanoma skin cancer 10/10/2015  . Pain in right shoulder 09/13/2015  . Osteoarthritis 07/06/2015  . Lymphedema 05/30/2015  . Other specified soft tissue disorders 05/30/2015  . Hyperlipidemia 03/29/2015  . Family history of type 2 diabetes mellitus in father 03/29/2015  . DD (diverticular disease) 02/03/2015  . Diverticulosis of intestine without perforation or abscess without bleeding 02/03/2015  . DDD (degenerative disc disease), lumbar 03/03/2014  . Intervertebral disc disorder with radiculopathy of lumbar region 03/03/2014  . Lumbar radiculitis 03/03/2014  . Lumbar stenosis with neurogenic claudication 03/03/2014  . Arteriosclerosis of coronary artery 04/18/2012  . S/P coronary artery stent placement 04/18/2012  . Status post percutaneous transluminal coronary angioplasty 04/18/2012    Past Surgical History:  Procedure Laterality Date  . CATARACT EXTRACTION W/PHACO Right 11/24/2019   Procedure: CATARACT EXTRACTION PHACO AND INTRAOCULAR LENS PLACEMENT (IOC) RIGHT MALYUGIN 8.59 00:50.8;  Surgeon: Galen ManilaPorfilio, William, MD;  [facility];  Service: Ophthalmology;  Laterality: Right;  COVID ( + ) 09-01-19  . CATARACT EXTRACTION W/PHACO Left 12/15/2019   Procedure: CATARACT EXTRACTION PHACO AND INTRAOCULAR LENS PLACEMENT (IOC)  LEFT 9.90  01:03.7;  Surgeon: Birder Robson, MD;  Location: [facility];  Service: Ophthalmology;  Laterality: Left;  . CORONARY ANGIOPLASTY WITH STENT PLACEMENT  2005   [facility]    Family History  Problem Relation Age of Onset  . Hypertension Mother   . Heart  disease Mother   . Heart disease Father   . Hypertension Father     Social History   Socioeconomic History  . Marital status: Widowed    Spouse name: Not on file  . Number of children: 3  . Years of education: Not on file  . Highest education level: 10th grade  Occupational History  . Occupation: retired   Tobacco Use  . Smoking status: Former Smoker    Quit date: 05/16/1963    Years since quitting: 57.2  . Smokeless tobacco: Never Used  . Tobacco comment: quit over 50 years ago  Vaping Use  . Vaping Use: Never used  Substance and Sexual Activity  . Alcohol use: No  . Drug use: No  . Sexual activity: Not Currently    Partners: Female  Other Topics Concern  . Not on file  Social History Narrative  . Not on file   Social Determinants of Health   Financial Resource Strain: Low Risk   . Difficulty of Paying Living Expenses: Not hard at all  Food Insecurity: No Food Insecurity  . Worried About Charity fundraiser in the Last Year: Never true  . Ran Out of Food in the Last Year: Never true  Transportation Needs: No Transportation Needs  . Lack of Transportation (Medical): No  . Lack of Transportation (Non-Medical): No  Physical Activity: Sufficiently Active  . Days of Exercise per Week: 3 days  . Minutes of Exercise per Session: 60 min  Stress: No Stress Concern Present  . Feeling of Stress : Not at all  Social Connections: Moderately Isolated  . Frequency of Communication with Friends and Family: More than three times a week  . Frequency of Social Gatherings with Friends and Family: Twice a week  . Attends Religious Services: More than 4 times per year  . Active Member of Clubs or Organizations: No  . Attends Archivist Meetings: Never  . Marital Status: Widowed  Intimate Partner Violence: Not At Risk  . Fear of Current or Ex-Partner: No  . Emotionally Abused: No  . Physically Abused: No  . Sexually Abused: No     Current Outpatient Medications:  .   acetaminophen (TYLENOL) 500 MG tablet, Take 500 mg by mouth every 6 (six) hours as needed., Disp: , Rfl:  .  albuterol (VENTOLIN HFA) 108 (90 Base) MCG/ACT inhaler, Inhale 2 puffs into the lungs every 6 (six) hours as needed for wheezing or shortness of breath., Disp: 18 g, Rfl: 1 .  aspirin EC 81 MG tablet, Take 81 mg by mouth daily. , Disp: , Rfl:  .  atorvastatin (LIPITOR) 10 MG tablet, Take 1 tablet (10 mg total) by mouth daily., Disp: 90 tablet, Rfl: 1 .  diclofenac Sodium (VOLTAREN) 1 % GEL, Apply 4 g topically 4 (four) times daily., Disp: 300 g, Rfl: 0 .  gabapentin (NEURONTIN) 400 MG capsule, TAKE 1 CAPSULE THREE TIMES DAILY, Disp: 180 capsule, Rfl: 4 .  Loratadine 10 MG CAPS, Take 10 mg by mouth daily. , Disp: , Rfl:  .  metoprolol succinate (TOPROL-XL) 25 MG 24 hr tablet, TAKE 1/2 TABLET EVERY DAY IN PLACE OF LOPRESSOR, Disp: 45 tablet, Rfl:  2 .  metroNIDAZOLE (METROGEL) 0.75 % gel, APPLY AT BEDTIME TO BUMPS ON SCALP., Disp: 45 g, Rfl: PRN .  montelukast (SINGULAIR) 10 MG tablet, Take 1 tablet (10 mg total) by mouth at bedtime., Disp: 90 tablet, Rfl: 1 .  pantoprazole (PROTONIX) 40 MG tablet, Take 1 tablet (40 mg total) by mouth daily., Disp: 90 tablet, Rfl: 0 .  Probiotic Product (PROBIOTIC PO), Take by mouth daily. , Disp: , Rfl:  .  tamsulosin (FLOMAX) 0.4 MG CAPS capsule, Take 1 capsule (0.4 mg total) by mouth daily., Disp: 90 capsule, Rfl: 3 .  triamcinolone (KENALOG) 0.1 %, APPLY TO THE AFFECTED AREA(S) TOPICALLY TWICE DAILY, Disp: 45 g, Rfl: 0  Allergies  Allergen Reactions  . Gramineae Pollens Itching and Shortness Of Breath    Sneezing  . Cat Hair Extract Itching    Watery eyes and sneezing  . Breo Ellipta [Fluticasone Furoate-Vilanterol] Rash    Patient stated that it made his mouth break out  . Rosuvastatin Rash and Other (See Comments)    No reaction per pt, He saw some information regarding Crestor and adverse effects     I personally reviewed active problem list,  allergies with the patient/caregiver today.   ROS  Ten systems reviewed and is negative except as mentioned in HPI   Objective  Virtual encounter, vitals not obtained.  There is no height or weight on file to calculate BMI.  Physical Exam   Awake, alert and oriented  Results for orders placed or performed in visit on 07/18/20 (from the past 72 hour(s))  Novel Coronavirus, NAA ([facility])     Status: Abnormal   Collection Time: 07/18/20 11:51 AM   Specimen: Nasopharyngeal(NP) swabs in vial transport medium   Nasopharynge  Result Value Ref Range   SARS-CoV-2, NAA Detected (A) Not Detected    Comment: Patients who have a positive COVID-19 test result may now have treatment options. Treatment options are available for patients with mild to moderate symptoms and for hospitalized patients. Visit our website at [email or web address] for resources and information. This nucleic acid amplification test was developed and its performance characteristics determined by [employer]. Nucleic acid amplification tests include RT-PCR and TMA. This test has not been FDA cleared or approved. This test has been authorized by FDA under an Emergency Use Authorization (EUA). This test is only authorized for the duration of time the declaration that circumstances exist justifying the authorization of the emergency use of in vitro diagnostic tests for detection of SARS-CoV-2 virus and/or diagnosis of COVID-19 infection under section 564(b)(1) of the Act, 21 U.S.C. 725DGU-4(Q) (1), unless the authorization is terminated or revoked sooner. When diagnostic testing is negativ e, the possibility of a false negative result should be considered in the context of a patient's recent exposures and the presence of clinical signs and symptoms consistent with COVID-19. An individual without symptoms of COVID-19 and who is not shedding SARS-CoV-2 virus would expect to have a negative (not  detected) result in this assay.   SARS-COV-2, NAA 2 DAY TAT     Status: None   Collection Time: 07/18/20 11:51 AM   Nasopharynge  Result Value Ref Range   SARS-CoV-2, NAA 2 DAY TAT Performed     PHQ2/9: Depression screen [facility] 2/9 06/28/2020 12/25/2019 11/17/2019 09/22/2019 09/03/2019  Decreased Interest 0 0 0 0 0  Down, Depressed, Hopeless 0 0 0 0 0  PHQ - 2 Score 0 0 0 0 0  Altered sleeping - 0 - 0 0  Tired, decreased energy - 0 - 0 0  Change in appetite - 0 - 0 0  Feeling bad or failure about yourself  - 0 - 0 0  Trouble concentrating - 0 - 0 0  Moving slowly or fidgety/restless - 0 - 0 0  Suicidal thoughts - 0 - 0 0  PHQ-9 Score - 0 - 0 0  Difficult doing work/chores - - - - Not difficult at all  Some recent data might be hidden   PHQ-2/9 Result is negative.    Fall Risk: Fall Risk  06/28/2020 12/25/2019 11/17/2019 09/22/2019 09/03/2019  Falls in the past year? 0 1 1 1  0  Number falls in past yr: 0 0 1 0 0  Injury with Fall? 0 0 1 1 0  Risk for fall due to : - - History of fall(s) - -  Follow up - - Falls prevention discussed - -    Assessment & Plan  1. COVID-19  Doing better, discussed risk Monoclonal antibodies   I discussed the assessment and treatment plan with the patient. The patient was provided an opportunity to ask questions and all were answered. The patient agreed with the plan and demonstrated an understanding of the instructions.   The patient was advised to call back or seek an in-person evaluation if the symptoms worsen or if the condition fails to improve as anticipated.  I provided 15  minutes of non-face-to-face time during this encounter.  Carlene Coria, CMA

## 2020-07-26 ENCOUNTER — Ambulatory Visit: Payer: Medicare HMO

## 2020-08-04 DIAGNOSIS — M7582 Other shoulder lesions, left shoulder: Secondary | ICD-10-CM | POA: Diagnosis not present

## 2020-08-04 DIAGNOSIS — M25512 Pain in left shoulder: Secondary | ICD-10-CM | POA: Diagnosis not present

## 2020-08-08 DIAGNOSIS — M25512 Pain in left shoulder: Secondary | ICD-10-CM | POA: Diagnosis not present

## 2020-08-08 DIAGNOSIS — M542 Cervicalgia: Secondary | ICD-10-CM | POA: Diagnosis not present

## 2020-08-10 ENCOUNTER — Other Ambulatory Visit: Payer: Self-pay | Admitting: Family Medicine

## 2020-08-10 DIAGNOSIS — G629 Polyneuropathy, unspecified: Secondary | ICD-10-CM

## 2020-08-17 DIAGNOSIS — M25512 Pain in left shoulder: Secondary | ICD-10-CM | POA: Diagnosis not present

## 2020-08-17 DIAGNOSIS — M1812 Unilateral primary osteoarthritis of first carpometacarpal joint, left hand: Secondary | ICD-10-CM | POA: Diagnosis not present

## 2020-09-08 DIAGNOSIS — I6523 Occlusion and stenosis of bilateral carotid arteries: Secondary | ICD-10-CM | POA: Diagnosis not present

## 2020-09-08 DIAGNOSIS — E7800 Pure hypercholesterolemia, unspecified: Secondary | ICD-10-CM | POA: Diagnosis not present

## 2020-09-08 DIAGNOSIS — R42 Dizziness and giddiness: Secondary | ICD-10-CM | POA: Diagnosis not present

## 2020-09-08 DIAGNOSIS — I712 Thoracic aortic aneurysm, without rupture: Secondary | ICD-10-CM | POA: Diagnosis not present

## 2020-09-08 DIAGNOSIS — I10 Essential (primary) hypertension: Secondary | ICD-10-CM | POA: Diagnosis not present

## 2020-09-08 DIAGNOSIS — I2510 Atherosclerotic heart disease of native coronary artery without angina pectoris: Secondary | ICD-10-CM | POA: Diagnosis not present

## 2020-09-08 DIAGNOSIS — R9431 Abnormal electrocardiogram [ECG] [EKG]: Secondary | ICD-10-CM | POA: Diagnosis not present

## 2020-09-08 DIAGNOSIS — I5022 Chronic systolic (congestive) heart failure: Secondary | ICD-10-CM | POA: Diagnosis not present

## 2020-09-08 DIAGNOSIS — Z955 Presence of coronary angioplasty implant and graft: Secondary | ICD-10-CM | POA: Diagnosis not present

## 2020-09-08 DIAGNOSIS — R0602 Shortness of breath: Secondary | ICD-10-CM | POA: Diagnosis not present

## 2020-09-08 DIAGNOSIS — I255 Ischemic cardiomyopathy: Secondary | ICD-10-CM | POA: Diagnosis not present

## 2020-09-08 DIAGNOSIS — R0600 Dyspnea, unspecified: Secondary | ICD-10-CM | POA: Diagnosis not present

## 2020-09-15 DIAGNOSIS — I2510 Atherosclerotic heart disease of native coronary artery without angina pectoris: Secondary | ICD-10-CM | POA: Diagnosis not present

## 2020-09-15 DIAGNOSIS — I10 Essential (primary) hypertension: Secondary | ICD-10-CM | POA: Diagnosis not present

## 2020-09-15 DIAGNOSIS — R42 Dizziness and giddiness: Secondary | ICD-10-CM | POA: Diagnosis not present

## 2020-09-15 DIAGNOSIS — R9431 Abnormal electrocardiogram [ECG] [EKG]: Secondary | ICD-10-CM | POA: Diagnosis not present

## 2020-09-15 DIAGNOSIS — I6523 Occlusion and stenosis of bilateral carotid arteries: Secondary | ICD-10-CM | POA: Diagnosis not present

## 2020-09-15 DIAGNOSIS — E7800 Pure hypercholesterolemia, unspecified: Secondary | ICD-10-CM | POA: Diagnosis not present

## 2020-09-21 ENCOUNTER — Other Ambulatory Visit: Payer: Self-pay

## 2020-09-21 ENCOUNTER — Ambulatory Visit: Payer: Medicare Other | Admitting: Dermatology

## 2020-09-21 DIAGNOSIS — L719 Rosacea, unspecified: Secondary | ICD-10-CM | POA: Diagnosis not present

## 2020-09-21 DIAGNOSIS — L570 Actinic keratosis: Secondary | ICD-10-CM | POA: Diagnosis not present

## 2020-09-21 DIAGNOSIS — L820 Inflamed seborrheic keratosis: Secondary | ICD-10-CM

## 2020-09-21 DIAGNOSIS — L578 Other skin changes due to chronic exposure to nonionizing radiation: Secondary | ICD-10-CM | POA: Diagnosis not present

## 2020-09-21 DIAGNOSIS — M170 Bilateral primary osteoarthritis of knee: Secondary | ICD-10-CM

## 2020-09-21 MED ORDER — DOXYCYCLINE MONOHYDRATE 50 MG PO CAPS: 50.0000 mg | ORAL_CAPSULE | Freq: Every day | ORAL | 2 refills | Status: DC

## 2020-09-21 MED ORDER — TAMSULOSIN HCL 0.4 MG PO CAPS: 0.4000 mg | ORAL_CAPSULE | Freq: Every day | ORAL | 3 refills | Status: DC

## 2020-09-21 MED ORDER — ALBUTEROL SULFATE HFA 108 (90 BASE) MCG/ACT IN AERS: 2.0000 | INHALATION_SPRAY | Freq: Four times a day (QID) | RESPIRATORY_TRACT | 1 refills | Status: DC | PRN

## 2020-09-21 MED ORDER — DICLOFENAC SODIUM 1 % EX GEL: 4.0000 g | Freq: Four times a day (QID) | CUTANEOUS | 0 refills | Status: DC

## 2020-09-21 NOTE — Progress Notes (Signed)
   Follow-Up Visit   Subjective  Mark [name] is a 87 y.o. male who presents for the following: Rosacea (Face,scalp, metronidazole 0.75% gel qhs pt still breaking out, using lye soap) and Actinic Keratosis (Scalp/ears, 62m f/u).  The following portions of the chart were reviewed this encounter and updated as appropriate:   Tobacco  Allergies  Meds  Problems  Med Hx  Surg Hx  Fam Hx     Review of Systems:  No other skin or systemic complaints except as noted in HPI or Assessment and Plan.  Objective  Well appearing patient in no apparent distress; mood and affect are within normal limits.  A focused examination was performed including face, scalp, ears. Relevant physical exam findings are noted in the Assessment and Plan.  Objective  face, scalp: Several crust post scalp within hairline  Objective  scalp x 20, face x 5 (25): Pink scaly macules   Objective  Right Forehead x 1: Erythematous keratotic or waxy stuck-on papule    Assessment & Plan    Actinic Damage - chronic, secondary to cumulative UV radiation exposure/sun exposure over time - diffuse scaly erythematous macules with underlying dyspigmentation - Recommend daily broad spectrum sunscreen SPF 30+ to sun-exposed areas, reapply every 2 hours as needed.  - Call for new or changing lesions.  Rosacea /folliculitis of the scalp and less significant on the face face, scalp /Folliculitis Chronic, persistent Start Doxycycline 50mg  1 po qd with dinner,  D/c Metronidazole 0.75% gel  Rosacea is a chronic progressive skin condition usually affecting the face of adults, causing redness and/or acne bumps. It is treatable but not curable. It sometimes affects the eyes (ocular rosacea) as well. It may respond to topical and/or systemic medication and can flare with stress, sun exposure, alcohol, exercise and some foods.  Daily application of broad spectrum spf 30+ sunscreen to face is recommended to reduce  flares.  doxycycline (MONODOX) 50 MG capsule - face, scalp  AK (actinic keratosis) (25) scalp x 20, face x 5  Destruction of lesion - scalp x 20, face x 5 Complexity: simple   Destruction method: cryotherapy   Informed consent: discussed and consent obtained   Timeout:  patient name, date of birth, surgical site, and procedure verified Lesion destroyed using liquid nitrogen: Yes   Region frozen until ice ball extended beyond lesion: Yes   Outcome: patient tolerated procedure well with no complications   Post-procedure details: wound care instructions given    Inflamed seborrheic keratosis Right Forehead x 1  Destruction of lesion - Right Forehead x 1 Complexity: simple   Destruction method: cryotherapy   Informed consent: discussed and consent obtained   Timeout:  patient name, date of birth, surgical site, and procedure verified Lesion destroyed using liquid nitrogen: Yes   Region frozen until ice ball extended beyond lesion: Yes   Outcome: patient tolerated procedure well with no complications   Post-procedure details: wound care instructions given    Return in about 2 months (around 11/19/2020) for AK f/u, Rosacea f/u.  I, Othelia Pulling, RMA, am acting as scribe for Sarina Ser, MD .  Documentation: I have reviewed the above documentation for accuracy and completeness, and I agree with the above.  Sarina Ser, MD

## 2020-09-23 DIAGNOSIS — M75122 Complete rotator cuff tear or rupture of left shoulder, not specified as traumatic: Secondary | ICD-10-CM | POA: Diagnosis not present

## 2020-09-24 ENCOUNTER — Encounter: Payer: Self-pay | Admitting: Dermatology

## 2020-09-24 ENCOUNTER — Other Ambulatory Visit: Payer: Self-pay | Admitting: Family Medicine

## 2020-09-26 ENCOUNTER — Other Ambulatory Visit: Payer: Self-pay | Admitting: Family Medicine

## 2020-09-27 ENCOUNTER — Other Ambulatory Visit: Payer: Self-pay | Admitting: Family Medicine

## 2020-10-03 DIAGNOSIS — M75122 Complete rotator cuff tear or rupture of left shoulder, not specified as traumatic: Secondary | ICD-10-CM | POA: Diagnosis not present

## 2020-10-17 DIAGNOSIS — R0600 Dyspnea, unspecified: Secondary | ICD-10-CM | POA: Diagnosis not present

## 2020-10-17 DIAGNOSIS — Z955 Presence of coronary angioplasty implant and graft: Secondary | ICD-10-CM | POA: Diagnosis not present

## 2020-10-17 DIAGNOSIS — R0602 Shortness of breath: Secondary | ICD-10-CM | POA: Diagnosis not present

## 2020-10-17 DIAGNOSIS — I255 Ischemic cardiomyopathy: Secondary | ICD-10-CM | POA: Diagnosis not present

## 2020-10-17 DIAGNOSIS — I2510 Atherosclerotic heart disease of native coronary artery without angina pectoris: Secondary | ICD-10-CM | POA: Diagnosis not present

## 2020-10-18 DIAGNOSIS — I6523 Occlusion and stenosis of bilateral carotid arteries: Secondary | ICD-10-CM | POA: Diagnosis not present

## 2020-10-18 DIAGNOSIS — I5022 Chronic systolic (congestive) heart failure: Secondary | ICD-10-CM | POA: Diagnosis not present

## 2020-10-18 DIAGNOSIS — Z955 Presence of coronary angioplasty implant and graft: Secondary | ICD-10-CM | POA: Diagnosis not present

## 2020-10-18 DIAGNOSIS — R42 Dizziness and giddiness: Secondary | ICD-10-CM | POA: Diagnosis not present

## 2020-10-18 DIAGNOSIS — E7800 Pure hypercholesterolemia, unspecified: Secondary | ICD-10-CM | POA: Diagnosis not present

## 2020-10-18 DIAGNOSIS — I10 Essential (primary) hypertension: Secondary | ICD-10-CM | POA: Diagnosis not present

## 2020-10-18 DIAGNOSIS — I255 Ischemic cardiomyopathy: Secondary | ICD-10-CM | POA: Diagnosis not present

## 2020-10-18 DIAGNOSIS — I712 Thoracic aortic aneurysm, without rupture: Secondary | ICD-10-CM | POA: Diagnosis not present

## 2020-10-18 DIAGNOSIS — Z9861 Coronary angioplasty status: Secondary | ICD-10-CM | POA: Diagnosis not present

## 2020-10-18 DIAGNOSIS — R0602 Shortness of breath: Secondary | ICD-10-CM | POA: Diagnosis not present

## 2020-10-18 DIAGNOSIS — I2510 Atherosclerotic heart disease of native coronary artery without angina pectoris: Secondary | ICD-10-CM | POA: Diagnosis not present

## 2020-10-20 DIAGNOSIS — M25552 Pain in left hip: Secondary | ICD-10-CM | POA: Diagnosis not present

## 2020-10-20 DIAGNOSIS — M1612 Unilateral primary osteoarthritis, left hip: Secondary | ICD-10-CM | POA: Diagnosis not present

## 2020-10-23 ENCOUNTER — Other Ambulatory Visit: Payer: Self-pay | Admitting: Family Medicine

## 2020-10-23 DIAGNOSIS — J3089 Other allergic rhinitis: Secondary | ICD-10-CM

## 2020-10-23 DIAGNOSIS — K219 Gastro-esophageal reflux disease without esophagitis: Secondary | ICD-10-CM

## 2020-10-23 DIAGNOSIS — I700 Atherosclerosis of aorta: Secondary | ICD-10-CM

## 2020-10-23 DIAGNOSIS — E7800 Pure hypercholesterolemia, unspecified: Secondary | ICD-10-CM

## 2020-11-17 ENCOUNTER — Ambulatory Visit (INDEPENDENT_AMBULATORY_CARE_PROVIDER_SITE_OTHER): Payer: Medicare Other

## 2020-11-17 DIAGNOSIS — Z0000 Encounter for general adult medical examination without abnormal findings: Secondary | ICD-10-CM

## 2020-11-17 NOTE — Patient Instructions (Signed)
Mark [name] , Thank you for taking time to come for your Medicare Wellness Visit. I appreciate your ongoing commitment to your health goals. Please review the following plan we discussed and let me know if I can assist you in the future.   Screening recommendations/referrals: Colonoscopy: no longer required Recommended yearly ophthalmology/optometry visit for glaucoma screening and checkup Recommended yearly dental visit for hygiene and checkup  Vaccinations: Influenza vaccine: done 07/15/20 Pneumococcal vaccine: done 11/21/17 Tdap vaccine: done 07/28/17 Shingles vaccine: Shingrix discussed. Please contact your pharmacy for coverage information.  Covid-19:  Done 10/26/19, 11/18/19 & 07/15/20  Advanced directives: Please bring a copy of your health care power of attorney and living will to the office at your convenience.  Conditions/risks identified: Recommend increasing physical activity as tolerated  Next appointment: Follow up in one year for your annual wellness visit.   Preventive Care 20 Years and Older, Male Preventive care refers to lifestyle choices and visits with your health care provider that can promote health and wellness. What does preventive care include?  A yearly physical exam. This is also called an annual well check.  Dental exams once or twice a year.  Routine eye exams. Ask your health care provider how often you should have your eyes checked.  Personal lifestyle choices, including:  Daily care of your teeth and gums.  Regular physical activity.  Eating a healthy diet.  Avoiding tobacco and drug use.  Limiting alcohol use.  Practicing safe sex.  Taking low doses of aspirin every day.  Taking vitamin and mineral supplements as recommended by your health care provider. What happens during an annual well check? The services and screenings done by your health care provider during your annual well check will depend on your age, overall health, lifestyle  risk factors, and family history of disease. Counseling  Your health care provider may ask you questions about your:  Alcohol use.  Tobacco use.  Drug use.  Emotional well-being.  Home and relationship well-being.  Sexual activity.  Eating habits.  History of falls.  Memory and ability to understand (cognition).  Work and work Statistician. Screening  You may have the following tests or measurements:  Height, weight, and BMI.  Blood pressure.  Lipid and cholesterol levels. These may be checked every 5 years, or more frequently if you are over 102 years old.  Skin check.  Lung cancer screening. You may have this screening every year starting at age 85 if you have a 30-pack-year history of smoking and currently smoke or have quit within the past 15 years.  Fecal occult blood test (FOBT) of the stool. You may have this test every year starting at age 38.  Flexible sigmoidoscopy or colonoscopy. You may have a sigmoidoscopy every 5 years or a colonoscopy every 10 years starting at age 20.  Prostate cancer screening. Recommendations will vary depending on your family history and other risks.  Hepatitis C blood test.  Hepatitis B blood test.  Sexually transmitted disease (STD) testing.  Diabetes screening. This is done by checking your blood sugar (glucose) after you have not eaten for a while (fasting). You may have this done every 1-3 years.  Abdominal aortic aneurysm (AAA) screening. You may need this if you are a current or former smoker.  Osteoporosis. You may be screened starting at age 75 if you are at high risk. Talk with your health care provider about your test results, treatment options, and if necessary, the need for more tests. Vaccines  Your  health care provider may recommend certain vaccines, such as:  Influenza vaccine. This is recommended every year.  Tetanus, diphtheria, and acellular pertussis (Tdap, Td) vaccine. You may need a Td booster every 10  years.  Zoster vaccine. You may need this after age 59.  Pneumococcal 13-valent conjugate (PCV13) vaccine. One dose is recommended after age 53.  Pneumococcal polysaccharide (PPSV23) vaccine. One dose is recommended after age 72. Talk to your health care provider about which screenings and vaccines you need and how often you need them. This information is not intended to replace advice given to you by your health care provider. Make sure you discuss any questions you have with your health care provider. Document Released: 08/12/2015 Document Revised: 04/04/2016 Document Reviewed: 05/17/2015 Elsevier Interactive Patient Education  2017 [place] Prevention in the Home Falls can cause injuries. They can happen to people of all ages. There are many things you can do to make your home safe and to help prevent falls. What can I do on the outside of my home?  Regularly fix the edges of walkways and driveways and fix any cracks.  Remove anything that might make you trip as you walk through a door, such as a raised step or threshold.  Trim any bushes or trees on the path to your home.  Use bright outdoor lighting.  Clear any walking paths of anything that might make someone trip, such as rocks or tools.  Regularly check to see if handrails are loose or broken. Make sure that both sides of any steps have handrails.  Any raised decks and porches should have guardrails on the edges.  Have any leaves, snow, or ice cleared regularly.  Use sand or salt on walking paths during winter.  Clean up any spills in your garage right away. This includes oil or grease spills. What can I do in the bathroom?  Use night lights.  Install grab bars by the toilet and in the tub and shower. Do not use towel bars as grab bars.  Use non-skid mats or decals in the tub or shower.  If you need to sit down in the shower, use a plastic, non-slip stool.  Keep the floor dry. Clean up any water that  spills on the floor as soon as it happens.  Remove soap buildup in the tub or shower regularly.  Attach bath mats securely with double-sided non-slip rug tape.  Do not have throw rugs and other things on the floor that can make you trip. What can I do in the bedroom?  Use night lights.  Make sure that you have a light by your bed that is easy to reach.  Do not use any sheets or blankets that are too big for your bed. They should not hang down onto the floor.  Have a firm chair that has side arms. You can use this for support while you get dressed.  Do not have throw rugs and other things on the floor that can make you trip. What can I do in the kitchen?  Clean up any spills right away.  Avoid walking on wet floors.  Keep items that you use a lot in easy-to-reach places.  If you need to reach something above you, use a strong step stool that has a grab bar.  Keep electrical cords out of the way.  Do not use floor polish or wax that makes floors slippery. If you must use wax, use non-skid floor wax.  Do not  have throw rugs and other things on the floor that can make you trip. What can I do with my stairs?  Do not leave any items on the stairs.  Make sure that there are handrails on both sides of the stairs and use them. Fix handrails that are broken or loose. Make sure that handrails are as long as the stairways.  Check any carpeting to make sure that it is firmly attached to the stairs. Fix any carpet that is loose or worn.  Avoid having throw rugs at the top or bottom of the stairs. If you do have throw rugs, attach them to the floor with carpet tape.  Make sure that you have a light switch at the top of the stairs and the bottom of the stairs. If you do not have them, ask someone to add them for you. What else can I do to help prevent falls?  Wear shoes that:  Do not have high heels.  Have rubber bottoms.  Are comfortable and fit you well.  Are closed at the  toe. Do not wear sandals.  If you use a stepladder:  Make sure that it is fully opened. Do not climb a closed stepladder.  Make sure that both sides of the stepladder are locked into place.  Ask someone to hold it for you, if possible.  Clearly mark and make sure that you can see:  Any grab bars or handrails.  First and last steps.  Where the edge of each step is.  Use tools that help you move around (mobility aids) if they are needed. These include:  Canes.  Walkers.  Scooters.  Crutches.  Turn on the lights when you go into a dark area. Replace any light bulbs as soon as they burn out.  Set up your furniture so you have a clear path. Avoid moving your furniture around.  If any of your floors are uneven, fix them.  If there are any pets around you, be aware of where they are.  Review your medicines with your doctor. Some medicines can make you feel dizzy. This can increase your chance of falling. Ask your doctor what other things that you can do to help prevent falls. This information is not intended to replace advice given to you by your health care provider. Make sure you discuss any questions you have with your health care provider. Document Released: 05/12/2009 Document Revised: 12/22/2015 Document Reviewed: 08/20/2014 Elsevier Interactive Patient Education  2017 [employer].

## 2020-11-17 NOTE — Progress Notes (Signed)
Subjective:   Mark [name] is a 87 y.o. male who presents for Medicare Annual/Subsequent preventive examination.  Virtual Visit via Telephone Note  I connected with  Mark [name] on 11/17/20 at  8:00 AM EDT by telephone and verified that I am speaking with the correct person using two identifiers.  Location: Patient: home Provider: [place] Persons participating in the virtual visit: [place]   I discussed the limitations, risks, security and privacy concerns of performing an evaluation and management service by telephone and the availability of in person appointments. The patient expressed understanding and agreed to proceed.  Interactive audio and video telecommunications were attempted between this nurse and patient, however failed, due to patient having technical difficulties OR patient did not have access to video capability.  We continued and completed visit with audio only.  Some vital signs may be absent or patient reported.   Clemetine Marker, LPN    Review of Systems     Cardiac Risk Factors include: advanced age (>62men, >43 women);dyslipidemia;male gender;hypertension     Objective:    Today's Vitals   11/17/20 0807  PainSc: 5    There is no height or weight on file to calculate BMI.  Advanced Directives 11/17/2020 12/15/2019 11/24/2019 11/17/2019 09/14/2019 05/21/2017 04/15/2017  Does Patient Have a Medical Advance Directive? Yes Yes Yes Yes Yes Yes Yes  Type of Paramedic of [place];Living will [place];Living will Living will [place];Living will [place];Living will Living will;Healthcare Power of [place];Living will  Does patient want to make changes to medical advance directive? - - No - Patient declined - - - -  Copy of [place] in Chart? No - copy requested No - copy requested Yes - validated most recent copy  scanned in chart (See row information) No - copy requested - - No - copy requested  Would patient like information on creating a medical advance directive? - - - - - - -    Current Medications (verified) Outpatient Encounter Medications as of 11/17/2020  Medication Sig  . acetaminophen (TYLENOL) 500 MG tablet Take 500 mg by mouth every 6 (six) hours as needed.  Marland Kitchen albuterol (VENTOLIN HFA) 108 (90 Base) MCG/ACT inhaler USE 2 INHALATIONS BY MOUTH  EVERY 6 HOURS AS NEEDED FOR WHEEZING OR SHORTNESS OF  BREATH  . aspirin EC 81 MG tablet Take 81 mg by mouth daily.   Marland Kitchen atorvastatin (LIPITOR) 10 MG tablet TAKE 1 TABLET BY MOUTH  DAILY  . diclofenac Sodium (VOLTAREN) 1 % GEL Apply 4 g topically 4 (four) times daily.  Marland Kitchen doxycycline (MONODOX) 50 MG capsule Take 1 capsule (50 mg total) by mouth daily. Take with food and drink  . gabapentin (NEURONTIN) 400 MG capsule TAKE 1 CAPSULE THREE TIMES DAILY  . Loratadine 10 MG CAPS Take 10 mg by mouth daily.   . montelukast (SINGULAIR) 10 MG tablet TAKE 1 TABLET BY MOUTH AT  BEDTIME  . pantoprazole (PROTONIX) 40 MG tablet TAKE 1 TABLET BY MOUTH  DAILY  . Probiotic Product (PROBIOTIC PO) Take by mouth daily.   . tamsulosin (FLOMAX) 0.4 MG CAPS capsule Take 1 capsule (0.4 mg total) by mouth daily.  . [DISCONTINUED] metoprolol succinate (TOPROL-XL) 25 MG 24 hr tablet TAKE 1/2 TABLET EVERY DAY IN PLACE OF LOPRESSOR  . [DISCONTINUED] metroNIDAZOLE (METROGEL) 0.75 % gel APPLY AT BEDTIME TO BUMPS ON SCALP.  . [DISCONTINUED] triamcinolone (KENALOG)  0.1 % APPLY TO THE AFFECTED AREA(S) TOPICALLY TWICE DAILY   No facility-administered encounter medications on file as of 11/17/2020.    Allergies (verified) Gramineae pollens, Cat hair extract, Breo ellipta [fluticasone furoate-vilanterol], and Rosuvastatin   History: Past Medical History:  Diagnosis Date  . Actinic keratosis 11/20/2018   right proximal lateral bicep.  . Arthritis   . 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 Deep margin involved. 2nd Excision 12/23/2018, no residual lesion, margins free  . Carotid artery disease ([place])   . Coronary artery disease 2005  . COVID-19 09/01/2019   symptoms resolved 09/05/19  . GERD (gastroesophageal reflux disease)   . Hyperkalemia   . Hyperlipidemia   . Hypertension   . Past heart attack 2005  . Squamous cell carcinoma of skin 08/21/2018   Left distal dorsum forearm. WD SCC. ED[facility]   Past Surgical History:  Procedure Laterality Date  . CATARACT EXTRACTION W/PHACO Right 11/24/2019   Procedure: CATARACT EXTRACTION PHACO AND INTRAOCULAR LENS PLACEMENT (IOC) RIGHT MALYUGIN 8.59 00:50.8;  Surgeon: Birder Robson, MD;  Location: [place];  Service: Ophthalmology;  Laterality: Right;  COVID ( + ) 09-01-19  . CATARACT EXTRACTION W/PHACO Left 12/15/2019   Procedure: CATARACT EXTRACTION PHACO AND INTRAOCULAR LENS PLACEMENT (IOC) LEFT 9.90  01:03.7;  Surgeon: Birder Robson, MD;  Location: [place];  Service: Ophthalmology;  Laterality: Left;  . CORONARY ANGIOPLASTY WITH STENT PLACEMENT  2005   [facility]   Family History  Problem Relation Age of Onset  . Hypertension Mother   . Heart disease Mother   . Heart disease Father   . Hypertension Father    Social History   Socioeconomic History  . Marital status: Widowed    Spouse name: Not on file  . Number of children: 3  . Years of education: Not on file  . Highest education level: 10th grade  Occupational History  . Occupation: retired   Tobacco Use  . Smoking status: Former Smoker    Quit date: 05/16/1963    Years since quitting: 57.5  . Smokeless tobacco: Never Used  . Tobacco comment: quit over 50 years ago  Vaping Use  . Vaping Use: Never used  Substance and Sexual Activity  . Alcohol use: No  . Drug use: No  . Sexual activity: Not Currently    Partners: Female  Other Topics Concern  . Not on file  Social History Narrative   Pt lives alone.     Social Determinants of Health   Financial Resource Strain: Low Risk   . Difficulty of Paying Living Expenses: Not hard at all  Food Insecurity: No Food Insecurity  . Worried About Charity fundraiser in the Last Year: Never true  . Ran Out of Food in the Last Year: Never true  Transportation Needs: No Transportation Needs  . Lack of Transportation (Medical): No  . Lack of Transportation (Non-Medical): No  Physical Activity: Inactive  . Days of Exercise per Week: 0 days  . Minutes of Exercise per Session: 0 min  Stress: No Stress Concern Present  . Feeling of Stress : Not at all  Social Connections: Moderately Isolated  . Frequency of Communication with Friends and Family: More than three times a week  . Frequency of Social Gatherings with Friends and Family: Twice a week  . Attends Religious Services: More than 4 times per year  . Active Member of Clubs or Organizations: No  . Attends Archivist  Meetings: Never  . Marital Status: Widowed    Tobacco Counseling Counseling given: Not Answered Comment: quit over 50 years ago   Clinical Intake:  Pre-visit preparation completed: Yes  Pain : 0-10 Pain Score: 5  Pain Type: Chronic pain Pain Location: Leg Pain Orientation: Right Pain Descriptors / Indicators: Numbness,Aching Pain Onset: More than a month ago Pain Frequency: Intermittent     Nutritional Risks: None Diabetes: No  How often do you need to have someone help you when you read instructions, pamphlets, or other written materials from your doctor or pharmacy?: 1 - Never    Interpreter Needed?: No  Information entered by :: Clemetine Marker LPN   Activities of Daily Living In your present state of health, do you have any difficulty performing the following activities: 11/17/2020 07/20/2020  Hearing? N Y  Comment declines hearing aids -  Vision? N N  Difficulty concentrating or making decisions? N Y  Walking or climbing stairs? Y Y  Dressing or  bathing? N N  Doing errands, shopping? N N  Preparing Food and eating ? N -  Using the Toilet? N -  In the past six months, have you accidently leaked urine? N -  Do you have problems with loss of bowel control? N -  Managing your Medications? N -  Managing your Finances? N -  Housekeeping or managing your Housekeeping? N -  Some recent data might be hidden    Patient Care Team: Steele Sizer, MD as PCP - General (Family Medicine) Jeni Salles, MD as Consulting Physician (Ophthalmology) Delana Meyer, Dolores Lory, MD as Consulting Physician (Vascular Surgery) Corey Skains, MD as Consulting Physician (Cardiology) Emmaline Kluver., MD (Rheumatology) Ralene Bathe, MD (Dermatology) Blenda Mounts, MD as Referring Physician (Orthopedic Surgery)  Indicate any recent Medical Services you may have received from other than [facility] providers in the past year (date may be approximate).     Assessment:   This is a routine wellness examination for Mark [name].  Hearing/Vision screen  Hearing Screening   125Hz  250Hz  500Hz  1000Hz  2000Hz  3000Hz  4000Hz  6000Hz  8000Hz   Right ear:           Left ear:           Comments: Pt denies hearing difficulty  Vision Screening Comments: Annual vision screenings with [facility]  Dietary issues and exercise activities discussed: Current Exercise Habits: The patient does not participate in regular exercise at present, Exercise limited by: neurologic condition(s)  Goals    . Increase water intake     Starting 06/11/16, I will increase my water intake to 5-6 glasses a day.      Depression Screen PHQ 2/9 Scores 11/17/2020 07/20/2020 06/28/2020 12/25/2019 11/17/2019 09/22/2019 09/03/2019  PHQ - 2 Score 0 0 0 0 0 0 0  PHQ- 9 Score - - - 0 - 0 0    Fall Risk Fall Risk  11/17/2020 07/20/2020 06/28/2020 12/25/2019 11/17/2019  Falls in the past year? 0 0 0 1 1  Number falls in past yr: 0 0 0 0 1  Injury with Fall? 0 0 0 0 1  Risk for fall due to  : No Fall Risks - - - History of fall(s)  Follow up Falls prevention discussed - - - Falls prevention discussed    FALL RISK PREVENTION PERTAINING TO THE HOME:  Any stairs in or around the home? No  If so, are there any without handrails? No  Home free of loose throw rugs  in walkways, pet beds, electrical cords, etc? Yes  Adequate lighting in your home to reduce risk of falls? Yes   ASSISTIVE DEVICES UTILIZED TO PREVENT FALLS:  Life alert? No  Use of a cane, walker or w/c? No  Grab bars in the bathroom? No  Shower chair or bench in shower? Yes  Elevated toilet seat or a handicapped toilet? No   TIMED UP AND GO:  Was the test performed? No . Telephonic visit.   Cognitive Function: Normal cognitive status assessed by direct observation by this Nurse Health Advisor. No abnormalities found.       6CIT Screen 11/17/2019 06/11/2016  What Year? 0 points 0 points  What month? 0 points 0 points  What time? 0 points 0 points  Count back from 20 0 points 0 points  Months in reverse 0 points 0 points  Repeat phrase 0 points 2 points  Total Score 0 2    Immunizations Immunization History  Administered Date(s) Administered  . Fluad Quad(high Dose 65+) 08/17/2019  . Influenza, High Dose Seasonal PF 03/29/2015, 06/15/2016, 05/28/2018  . Influenza-Unspecified 05/23/2017, 10/23/2018, 07/15/2020  . PFIZER(Purple Top)SARS-COV-2 Vaccination 10/26/2019, 11/18/2019, 07/15/2020  . Pneumococcal Conjugate-13 06/11/2016  . Pneumococcal Polysaccharide-23 11/21/2017  . Tdap 07/28/2017  . Zoster 05/07/2007    TDAP status: Up to date  Flu Vaccine status: Up to date  Pneumococcal vaccine status: Up to date  Covid-19 vaccine status: Completed vaccines  Qualifies for Shingles Vaccine? Yes   Zostavax completed Yes   Shingrix Completed?: No.    Education has been provided regarding the importance of this vaccine. Patient has been advised to call insurance company to determine out of pocket  expense if they have not yet received this vaccine. Advised may also receive vaccine at local pharmacy or Health Dept. Verbalized acceptance and understanding.  Screening Tests Health Maintenance  Topic Date Due  . COVID-19 Vaccine (4 - Booster for Pfizer series) 01/13/2021  . INFLUENZA VACCINE  02/27/2021  . TETANUS/TDAP  07/29/2027  . PNA vac Low Risk Adult  Completed  . HPV VACCINES  Aged Out    Health Maintenance  There are no preventive care reminders to display for this patient.  Colorectal cancer screening: No longer required.   Lung Cancer Screening: (Low Dose CT Chest recommended if Age 77-80 years, 30 pack-year currently smoking OR have quit w/in 15years.) does not qualify.   Additional Screening:  Hepatitis C Screening: does not qualify.   Vision Screening: Recommended annual ophthalmology exams for early detection of glaucoma and other disorders of the eye. Is the patient up to date with their annual eye exam?  Yes  Who is the provider or what is the name of the office in which the patient attends annual eye exams? [facility].   Dental Screening: Recommended annual dental exams for proper oral hygiene  Community Resource Referral / Chronic Care Management: CRR required this visit?  No   CCM required this visit?  No      Plan:     I have personally reviewed and noted the following in the patient's chart:   . Medical and social history . Use of alcohol, tobacco or illicit drugs  . Current medications and supplements . Functional ability and status . Nutritional status . Physical activity . Advanced directives . List of other physicians . Hospitalizations, surgeries, and ER visits in previous 12 months . Vitals . Screenings to include cognitive, depression, and falls . Referrals and appointments  In addition,  I have reviewed and discussed with patient certain preventive protocols, quality metrics, and best practice recommendations. A written  personalized care plan for preventive services as well as general preventive health recommendations were provided to patient.     Clemetine Marker, LPN   [id number]/[id number]   Nurse Notes: pt states he needs a refill of gabapentin; advised to contact pharmacy to request refills.

## 2020-11-21 ENCOUNTER — Encounter: Payer: Self-pay | Admitting: Emergency Medicine

## 2020-11-21 ENCOUNTER — Other Ambulatory Visit: Payer: Self-pay

## 2020-11-21 ENCOUNTER — Ambulatory Visit
Admission: EM | Admit: 2020-11-21 | Discharge: 2020-11-21 | Disposition: A | Discharge status: Home / Self Care | Payer: Medicare Other | Attending: Physician Assistant | Admitting: Physician Assistant

## 2020-11-21 ENCOUNTER — Ambulatory Visit (INDEPENDENT_AMBULATORY_CARE_PROVIDER_SITE_OTHER): Payer: Medicare Other

## 2020-11-21 DIAGNOSIS — W19XXXA Unspecified fall, initial encounter: Secondary | ICD-10-CM

## 2020-11-21 DIAGNOSIS — J984 Other disorders of lung: Secondary | ICD-10-CM | POA: Diagnosis not present

## 2020-11-21 DIAGNOSIS — J849 Interstitial pulmonary disease, unspecified: Secondary | ICD-10-CM | POA: Diagnosis not present

## 2020-11-21 DIAGNOSIS — R0781 Pleurodynia: Secondary | ICD-10-CM

## 2020-11-21 DIAGNOSIS — M25511 Pain in right shoulder: Secondary | ICD-10-CM

## 2020-11-21 DIAGNOSIS — S29019A Strain of muscle and tendon of unspecified wall of thorax, initial encounter: Secondary | ICD-10-CM | POA: Diagnosis not present

## 2020-11-21 MED ORDER — TRAMADOL HCL 50 MG PO TABS: 50.0000 mg | ORAL_TABLET | Freq: Two times a day (BID) | ORAL | 0 refills | Status: AC | PRN

## 2020-11-21 NOTE — Discharge Instructions (Signed)
No fractures today.  This is likely pulled muscles.  You should feel better over the next 1 to 2 weeks.  The other concern would be that you possibly tore rotator cuff muscles since your shoulder range of motion is a little reduced.  If you are still having problems in the next week or 2 where symptoms get worse then you should follow-up with your PCP or [facility] walk-in in [place].  At this time, I have sent Ultram for you to take as needed for severe pain.  Take Tylenol and if that is not completely relieving the pain you can take the tramadol.  Can also use ice and heat.

## 2020-11-21 NOTE — ED Triage Notes (Signed)
Pt is present today with right side pain due to a fall. Pt states that he fell Saturday.

## 2020-11-21 NOTE — ED Provider Notes (Signed)
[facility]    CSN: [id number] Arrival date & time: 11/21/20  0920      History   Chief Complaint Chief Complaint  Patient presents with  . Fall    HPI Mark [name] is a 87 y.o. male presenting for right-sided rib and chest pain since 2 days ago.  Patient states that he was out in his yard and stepped in a hole, twisted his upper body and fell onto an outstretched right hand and his right side.  Patient admits to increased pain of his ribs and chest when he raises his right arm or takes a deep breath.  He says the shoulder itself does not really hurt.  He has not had any numbness, weakness or tingling.  He denies ever hitting his head or losing consciousness.  Patient denies any prodromal symptoms and states the fall was purely an accident.  He denies any other injuries.  He has taken Tylenol and put heat on the injured areas.  He has no other complaints.  Past medical history significant for hypertension, hyperlipidemia, previous MI, CHF, aortic atherosclerosis, and thoracic aortic aneurysm without rupture.  HPI  Past Medical History:  Diagnosis Date  . Actinic keratosis 11/20/2018   right proximal lateral bicep.  . Arthritis   . Atypical fibroxanthoma of skin of scalp 11/20/2018   Excised 12/02/2018. Deep margin involved. 2nd Excision 12/23/2018, no residual lesion, margins free  . Carotid artery disease ([place])   . Coronary artery disease 2005  . COVID-19 09/01/2019   symptoms resolved 09/05/19  . GERD (gastroesophageal reflux disease)   . Hyperkalemia   . Hyperlipidemia   . Hypertension   . Past heart attack 2005  . Squamous cell carcinoma of skin 08/21/2018   Left distal dorsum forearm. WD [facility]. [facility]    Patient Active Problem List   Diagnosis Date Noted  . Cardiac syncope 09/22/2019  . Respiratory tract infection due to COVID-19 virus 09/04/2019  . 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 Senile purpura ([place]) 09/29/2018  . Atherosclerosis of aorta ([place])  09/29/2018  . Hardening of the aorta (main artery of the heart) ([place]) 09/29/2018  . Abnormal ECG 06/16/2018  . Thoracic aortic aneurysm without rupture ([place]) 05/28/2018  . CHF (congestive heart failure), NYHA class III, chronic, systolic ([place]) 78/29/5621  . 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 Ischemic cardiomyopathy 06/11/2017  . Benign essential HTN 05/22/2017  . Bilateral carotid artery stenosis 05/22/2017  . Neuropathy 01/28/2017  . Right leg paresthesias 11/26/2016  . Hypertension 08/27/2016  . GERD (gastroesophageal reflux disease) 08/27/2016  . Hyperglycemia 05/10/2016  . History of nonmelanoma skin cancer 10/10/2015  . Pain in right shoulder 09/13/2015  . Osteoarthritis 07/06/2015  . Lymphedema 05/30/2015  . Other specified soft tissue disorders 05/30/2015  . Hyperlipidemia 03/29/2015  . Family history of type 2 diabetes mellitus in father 03/29/2015  . DD (diverticular disease) 02/03/2015  . Diverticulosis of intestine without perforation or abscess without bleeding 02/03/2015  . DDD (degenerative disc disease), lumbar 03/03/2014  . Intervertebral disc disorder with radiculopathy of lumbar region 03/03/2014  . Lumbar radiculitis 03/03/2014  . Lumbar stenosis with neurogenic claudication 03/03/2014  . Arteriosclerosis of coronary artery 04/18/2012  . S/P coronary artery stent placement 04/18/2012  . Status post percutaneous transluminal coronary angioplasty 04/18/2012    Past Surgical History:  Procedure Laterality Date  . CATARACT EXTRACTION W/PHACO Right 11/24/2019   Procedure: CATARACT EXTRACTION PHACO AND INTRAOCULAR LENS PLACEMENT (IOC) RIGHT MALYUGIN 8.59  00:50.8;  Surgeon: Birder Robson, MD;  Location: [place];  Service: Ophthalmology;  Laterality: Right;  COVID ( + ) 09-01-19  . CATARACT EXTRACTION W/PHACO Left 12/15/2019   Procedure: CATARACT EXTRACTION PHACO AND INTRAOCULAR LENS PLACEMENT (IOC) LEFT 9.90  01:03.7;  Surgeon:  Birder Robson, MD;  Location: [place];  Service: Ophthalmology;  Laterality: Left;  . CORONARY ANGIOPLASTY WITH STENT PLACEMENT  2005   [facility]       Home Medications    Prior to Admission medications   Medication Sig Start Date End Date Taking? Authorizing Provider  traMADol (ULTRAM) 50 MG tablet Take 1 tablet (50 mg total) by mouth every 12 (twelve) hours as needed for up to 5 days. 11/21/20 11/26/20 Yes Danton Clap, PA-C  acetaminophen (TYLENOL) 500 MG tablet Take 500 mg by mouth every 6 (six) hours as needed.    [provider]  albuterol (VENTOLIN HFA) 108 (90 Base) MCG/ACT inhaler USE 2 INHALATIONS BY MOUTH  EVERY 6 HOURS AS NEEDED FOR WHEEZING OR SHORTNESS OF  BREATH 09/27/20   Steele Sizer, MD  aspirin EC 81 MG tablet Take 81 mg by mouth daily.     [provider]  atorvastatin (LIPITOR) 10 MG tablet TAKE 1 TABLET BY MOUTH  DAILY 10/24/20   Steele Sizer, MD  diclofenac Sodium (VOLTAREN) 1 % GEL Apply 4 g topically 4 (four) times daily. 09/21/20   Steele Sizer, MD  doxycycline (MONODOX) 50 MG capsule Take 1 capsule (50 mg total) by mouth daily. Take with food and drink 09/21/20   Ralene Bathe, MD  gabapentin (NEURONTIN) 400 MG capsule TAKE 1 CAPSULE THREE TIMES DAILY 05/16/20   Steele Sizer, MD  Loratadine 10 MG CAPS Take 10 mg by mouth daily.     [provider]  montelukast (SINGULAIR) 10 MG tablet TAKE 1 TABLET BY MOUTH AT  BEDTIME 10/24/20   Steele Sizer, MD  pantoprazole (PROTONIX) 40 MG tablet TAKE 1 TABLET BY MOUTH  DAILY 10/24/20   Steele Sizer, MD  Probiotic Product (PROBIOTIC PO) Take by mouth daily.     [provider]  tamsulosin (FLOMAX) 0.4 MG CAPS capsule Take 1 capsule (0.4 mg total) by mouth daily. 09/21/20   Steele Sizer, MD    Family History Family History  Problem Relation Age of Onset  . Hypertension Mother   . Heart disease Mother   . Heart disease Father   . Hypertension Father      Social History Social History   Tobacco Use  . Smoking status: Former Smoker    Quit date: 05/16/1963    Years since quitting: 57.5  . Smokeless tobacco: Never Used  . Tobacco comment: quit over 50 years ago  Vaping Use  . Vaping Use: Never used  Substance Use Topics  . Alcohol use: No  . Drug use: No     Allergies   Gramineae pollens, Cat hair extract, Breo ellipta [fluticasone furoate-vilanterol], and Rosuvastatin   Review of Systems Review of Systems  Constitutional: Negative for fatigue.  Respiratory: Negative for cough, chest tightness, shortness of breath and wheezing.   Cardiovascular: Positive for chest pain (R chest, post fall). Negative for palpitations and leg swelling.  Gastrointestinal: Negative for abdominal pain, nausea and vomiting.  Musculoskeletal: Negative for arthralgias, back pain, gait problem and joint swelling.  Skin: Negative for color change.  Neurological: Negative for dizziness, syncope, weakness, numbness and headaches.     Physical Exam Triage Vital Signs ED Triage Vitals  Enc Vitals Group     BP 11/21/20 0936 113/76     Pulse Rate 11/21/20 0936 77     Resp 11/21/20 0936 17     Temp 11/21/20 0936 97.6 F (36.4 C)     Temp Source 11/21/20 0936 Oral     SpO2 11/21/20 0936 97 %     Weight --      Height --      Head Circumference --      Peak Flow --      Pain Score 11/21/20 0934 8     Pain Loc --      Pain Edu? --      Excl. in [place]? --    No data found.  Updated Vital Signs BP 113/76 (BP Location: Left Arm)   Pulse 77   Temp 97.6 F (36.4 C) (Oral)   Resp 17   SpO2 97%       Physical Exam Vitals and nursing note reviewed.  Constitutional:      General: He is not in acute distress.    Appearance: Normal appearance. He is well-developed. He is not ill-appearing or diaphoretic.  HENT:     Head: Normocephalic and atraumatic.  Eyes:     General: No scleral icterus.    Conjunctiva/sclera: Conjunctivae normal.      Pupils: Pupils are equal, round, and reactive to light.  Cardiovascular:     Rate and Rhythm: Normal rate and regular rhythm.  Pulmonary:     Effort: Pulmonary effort is normal. No respiratory distress.     Breath sounds: Normal breath sounds. No wheezing, rhonchi or rales.  Chest:     Chest wall: Tenderness (moderate TTP ribs 4-6 and right upper chest wall) present.  Musculoskeletal:     Cervical back: Neck supple.     Comments: Right shoulder: No bony tenderness but does have reduced abduction beyond 90 degrees due to increased pain.  Good strength and sensation.  Skin:    General: Skin is warm and dry.     Findings: No bruising.  Neurological:     General: No focal deficit present.     Mental Status: He is alert. Mental status is at baseline.     Motor: No weakness.     Gait: Gait normal.  Psychiatric:        Mood and Affect: Mood normal.        Behavior: Behavior normal.        Thought Content: Thought content normal.      UC Treatments / Results  Labs (all labs ordered are listed, but only abnormal results are displayed) Labs Reviewed - No data to display  EKG   Radiology DG Ribs Unilateral W/Chest Right  Result Date: 11/21/2020 CLINICAL DATA:  Fall with right-sided rib pain. EXAM: RIGHT RIBS AND CHEST - 3+ VIEW COMPARISON:  Chest radiographs 09/03/2019 FINDINGS: The cardiomediastinal silhouette is unchanged with normal heart size. The thoracic aorta is tortuous. There is mild chronic prominence of the interstitial markings with unchanged scarring in the lung bases. No acute airspace consolidation, overt pulmonary edema, pleural effusion, pneumothorax is identified. No rib fracture is identified. IMPRESSION: No rib fracture identified. Electronically Signed   By: Logan Bores M.D.   On: 11/21/2020 10:25   DG Shoulder Right  Result Date: 11/21/2020 CLINICAL DATA:  Acute right shoulder pain after fall 3 days ago. EXAM: RIGHT SHOULDER - 2+ VIEW COMPARISON:  None. FINDINGS:  There is no evidence of fracture or dislocation.  Mild degenerative changes are seen involving the right glenohumeral and acromioclavicular joint. Soft tissues are unremarkable. IMPRESSION: Mild degenerative joint disease as described above. No acute abnormality is noted. Electronically Signed   By: Marijo Conception M.D.   On: 11/21/2020 10:23    Procedures Procedures (including critical care time)  Medications Ordered in UC Medications - No data to display  Initial Impression / Assessment and Plan / UC Course  I have reviewed the triage vital signs and the nursing notes.  Pertinent labs & imaging results that were available during my care of the patient were reviewed by me and considered in my medical decision making (see chart for details).   87 year old male with accidental fall and subsequent right-sided chest and rib pain.  Accident happened 2 days ago.  Has been treating with Tylenol and heat.  Exam reveals tenderness of the right ribs 4 through 6 and right chest wall.  Limited range of motion of the shoulder due to increased pain, but pain does appear to be increased in the chest and rib area with movement of the shoulder.  X-ray of right ribs and chest as well as right shoulder obtained today.  Suspect possible fracture of ribs and/or rotator cuff tear.  X-rays independently viewed by me.  Overread confirms no fractures noted on imaging.  Reviewed this result with patient.  Advised him he likely has pulled or strained muscles over the chest and thoracic region.  Cannot completely rule out rotator cuff tear at this time.  Advised him close follow-up with PCP or Ortho if not improving over the next 2 weeks or for any worsening symptoms.  Advise supportive care at this time.  I did offer him a sling but he declined.  Advised to continue taking Tylenol for pain.  I did give him a short supply of Ultram as well.  Reviewed controlled substance database.  Advised to follow-up with [place] as  needed.   Final Clinical Impressions(s) / UC Diagnoses   Final diagnoses:  Rib pain on right side  Acute pain of right shoulder  Fall, initial encounter  Strain of thoracic region, initial encounter     Discharge Instructions     No fractures today.  This is likely pulled muscles.  You should feel better over the next 1 to 2 weeks.  The other concern would be that you possibly tore rotator cuff muscles since your shoulder range of motion is a little reduced.  If you are still having problems in the next week or 2 where symptoms get worse then you should follow-up with your PCP or [facility] walk-in in [place].  At this time, I have sent Ultram for you to take as needed for severe pain.  Take Tylenol and if that is not completely relieving the pain you can take the tramadol.  Can also use ice and heat.    ED Prescriptions    Medication Sig Dispense Auth. Provider   traMADol (ULTRAM) 50 MG tablet Take 1 tablet (50 mg total) by mouth every 12 (twelve) hours as needed for up to 5 days. 8 tablet Danton Clap, PA-C     I have reviewed the PDMP during this encounter.   Danton Clap, PA-C 11/21/20 1108

## 2020-11-24 ENCOUNTER — Other Ambulatory Visit: Payer: Self-pay

## 2020-11-24 ENCOUNTER — Ambulatory Visit: Payer: Medicare Other | Admitting: Dermatology

## 2020-11-24 DIAGNOSIS — L570 Actinic keratosis: Secondary | ICD-10-CM

## 2020-11-24 DIAGNOSIS — L719 Rosacea, unspecified: Secondary | ICD-10-CM

## 2020-11-24 DIAGNOSIS — L578 Other skin changes due to chronic exposure to nonionizing radiation: Secondary | ICD-10-CM

## 2020-11-24 DIAGNOSIS — L820 Inflamed seborrheic keratosis: Secondary | ICD-10-CM

## 2020-11-24 DIAGNOSIS — Z85828 Personal history of other malignant neoplasm of skin: Secondary | ICD-10-CM | POA: Diagnosis not present

## 2020-11-24 NOTE — Progress Notes (Signed)
Follow-Up Visit   Subjective  Mark [name] is a 87 y.o. male who presents for the following: Actinic Keratosis (Follow up scalp, face treated with LN2) and Follow-up (Rosacea of scalp follow up treating with Doxycycline 50 mg qd/).  The following portions of the chart were reviewed this encounter and updated as appropriate:   Tobacco  Allergies  Meds  Problems  Med Hx  Surg Hx  Fam Hx     Review of Systems:  No other skin or systemic complaints except as noted in HPI or Assessment and Plan.  Objective  Well appearing patient in no apparent distress; mood and affect are within normal limits.  A focused examination was performed including face, scalp. Relevant physical exam findings are noted in the Assessment and Plan.  Objective  Face, scalp: Pinkness and pustules  Objective  Scalp, face, ears (30): Erythematous thin papules/macules with gritty scale.   Objective  Scalp (3): Erythematous keratotic or waxy stuck-on papule or plaque.    Assessment & Plan  Rosacea Face, scalp Will prescribe [facility] metronidazole/ivermectin/azelaic acid twice daily as needed to affected areas on the face. The patient was advised this is not covered by insurance since it is made by a compounding pharmacy. They will receive an email to check out and the medication will be mailed to their home.    Continue Doxycycline 50 mg 1 po qd with food and plenty of fluid  Rosacea is a chronic progressive skin condition usually affecting the face of adults, causing redness and/or acne bumps. It is treatable but not curable. It sometimes affects the eyes (ocular rosacea) as well. It may respond to topical and/or systemic medication and can flare with stress, sun exposure, alcohol, exercise and some foods.  Daily application of broad spectrum spf 30+ sunscreen to face is recommended to reduce flares.  Other Related Medications doxycycline (MONODOX) 50 MG capsule  AK (actinic keratosis)  (30) Scalp, face, ears Actinic Damage - Severe, confluent actinic changes with pre-cancerous actinic keratoses  - Severe, chronic, not at goal, secondary to cumulative UV radiation exposure over time - diffuse scaly erythematous macules and papules with underlying dyspigmentation - Discussed Prescription "Field Treatment" for Severe, Chronic Confluent Actinic Changes with Pre-Cancerous Actinic Keratoses Field treatment involves treatment of an entire area of skin that has confluent Actinic Changes (Sun/ Ultraviolet light damage) and PreCancerous Actinic Keratoses by method of PhotoDynamic Therapy (PDT) and/or prescription Topical Chemotherapy agents such as 5-fluorouracil, 5-fluorouracil/calcipotriene, and/or imiquimod.  The purpose is to decrease the number of clinically evident and subclinical PreCancerous lesions to prevent progression to development of skin cancer by chemically destroying early precancer changes that may or may not be visible.  It has been shown to reduce the risk of developing skin cancer in the treated area. As a result of treatment, redness, scaling, crusting, and open sores may occur during treatment course. One or more than one of these methods may be used and may have to be used several times to control, suppress and eliminate the PreCancerous changes. Discussed treatment course, expected reaction, and possible side effects. - Recommend daily broad spectrum sunscreen SPF 30+ to sun-exposed areas, reapply every 2 hours as needed.  - Staying in the shade or wearing long sleeves, sun glasses (UVA+UVB protection) and wide brim hats (4-inch brim around the entire circumference of the hat) are also recommended. - Call for new or changing lesions.   Destruction of lesion - Scalp, face, ears Complexity: simple   Destruction method:  cryotherapy   Informed consent: discussed and consent obtained   Timeout:  patient name, date of birth, surgical site, and procedure verified Lesion  destroyed using liquid nitrogen: Yes   Region frozen until ice ball extended beyond lesion: Yes   Outcome: patient tolerated procedure well with no complications   Post-procedure details: wound care instructions given    Inflamed seborrheic keratosis (3) Scalp Destruction of lesion - Scalp Complexity: simple   Destruction method: cryotherapy   Informed consent: discussed and consent obtained   Timeout:  patient name, date of birth, surgical site, and procedure verified Lesion destroyed using liquid nitrogen: Yes   Region frozen until ice ball extended beyond lesion: Yes   Outcome: patient tolerated procedure well with no complications   Post-procedure details: wound care instructions given    Return in about 3 months (around 02/23/2021).  I, Ashok Cordia, CMA, am acting as scribe for Sarina Ser, MD .  Documentation: I have reviewed the above documentation for accuracy and completeness, and I agree with the above.  Sarina Ser, MD

## 2020-11-24 NOTE — Patient Instructions (Addendum)
Instructions for [facility] Medications  One or more of your medications was sent to the [facility] mail order compounding pharmacy. You will receive a phone call from them and can purchase the medicine through them. It will then be mailed to your home at the address you confirmed. If you do not receive a phone call, please let us know. [facility] phone number is [phone].  Cryotherapy Aftercare  . Wash gently with soap and water everyday.   Marland Kitchen Apply Vaseline and Band-Aid daily until healed.   If you have any questions or concerns for your doctor, please call our main line at [phone] and press option 4 to reach your doctor's medical assistant. If no one answers, please leave a voicemail as directed and we will return your call as soon as possible. Messages left after 4 pm will be answered the following business day.   You may also send [place] a message via [place]. We typically respond to MyChart messages within 1-2 business days.  For prescription refills, please ask your pharmacy to contact our office. Our fax number is [phone].  If you have an urgent issue when the clinic is closed that cannot wait until the next business day, you can page your doctor at the number below.    Please note that while we do our best to be available for urgent issues outside of office hours, we are not available 24/7.   If you have an urgent issue and are unable to reach [place], you may choose to seek medical care at your doctor's office, retail clinic, urgent care center, or emergency room.  If you have a medical emergency, please immediately call 911 or go to the emergency department.  Pager Numbers  - Dr. Nehemiah Massed: [phone]  - Dr. Laurence Ferrari: [phone]  - Dr. Nicole Kindred: [phone]  In the event of inclement weather, please call our main line at [phone] for an update on the status of any delays or closures.  Dermatology Medication Tips: Please keep the boxes that topical  medications come in in order to help keep track of the instructions about where and how to use these. Pharmacies typically print the medication instructions only on the boxes and not directly on the medication tubes.   If your medication is too expensive, please contact our office at [phone] option 4 or send [place] a message through [place].   We are unable to tell what your co-pay for medications will be in advance as this is different depending on your insurance coverage. However, we may be able to find a substitute medication at lower cost or fill out paperwork to get insurance to cover a needed medication.   If a prior authorization is required to get your medication covered by your insurance company, please allow [place] 1-2 business days to complete this process.  Drug prices often vary depending on where the prescription is filled and some pharmacies may offer cheaper prices.  The website www.goodrx.com contains coupons for medications through different pharmacies. The prices here do not account for what the cost may be with help from insurance (it may be cheaper with your insurance), but the website can give you the price if you did not use any insurance.  - You can print the associated coupon and take it with your prescription to the pharmacy.  - You may also stop by our office during regular business hours and pick up a GoodRx coupon card.  - If you need your prescription sent electronically to a  different pharmacy, notify our office through [facility] or by phone at [phone] option 4.

## 2020-11-25 DIAGNOSIS — M75122 Complete rotator cuff tear or rupture of left shoulder, not specified as traumatic: Secondary | ICD-10-CM | POA: Diagnosis not present

## 2020-11-28 ENCOUNTER — Encounter: Payer: Self-pay | Admitting: Dermatology

## 2020-12-01 ENCOUNTER — Other Ambulatory Visit: Payer: Self-pay

## 2020-12-01 ENCOUNTER — Ambulatory Visit
Admission: EM | Admit: 2020-12-01 | Discharge: 2020-12-01 | Disposition: A | Discharge status: Home / Self Care | Payer: Medicare Other | Attending: Physician Assistant | Admitting: Physician Assistant

## 2020-12-01 ENCOUNTER — Encounter: Payer: Self-pay | Admitting: Emergency Medicine

## 2020-12-01 ENCOUNTER — Ambulatory Visit (INDEPENDENT_AMBULATORY_CARE_PROVIDER_SITE_OTHER): Payer: Medicare Other

## 2020-12-01 DIAGNOSIS — R918 Other nonspecific abnormal finding of lung field: Secondary | ICD-10-CM | POA: Diagnosis not present

## 2020-12-01 DIAGNOSIS — R0602 Shortness of breath: Secondary | ICD-10-CM

## 2020-12-01 DIAGNOSIS — I5023 Acute on chronic systolic (congestive) heart failure: Secondary | ICD-10-CM | POA: Diagnosis not present

## 2020-12-01 DIAGNOSIS — R2242 Localized swelling, mass and lump, left lower limb: Secondary | ICD-10-CM

## 2020-12-01 DIAGNOSIS — I110 Hypertensive heart disease with heart failure: Secondary | ICD-10-CM | POA: Diagnosis not present

## 2020-12-01 DIAGNOSIS — G4733 Obstructive sleep apnea (adult) (pediatric): Secondary | ICD-10-CM | POA: Diagnosis not present

## 2020-12-01 DIAGNOSIS — I509 Heart failure, unspecified: Secondary | ICD-10-CM

## 2020-12-01 DIAGNOSIS — R2241 Localized swelling, mass and lump, right lower limb: Secondary | ICD-10-CM | POA: Diagnosis not present

## 2020-12-01 DIAGNOSIS — I10 Essential (primary) hypertension: Secondary | ICD-10-CM | POA: Diagnosis not present

## 2020-12-01 DIAGNOSIS — M1990 Unspecified osteoarthritis, unspecified site: Secondary | ICD-10-CM | POA: Diagnosis not present

## 2020-12-01 DIAGNOSIS — Z87891 Personal history of nicotine dependence: Secondary | ICD-10-CM | POA: Diagnosis not present

## 2020-12-01 DIAGNOSIS — I2510 Atherosclerotic heart disease of native coronary artery without angina pectoris: Secondary | ICD-10-CM | POA: Diagnosis not present

## 2020-12-01 DIAGNOSIS — Z66 Do not resuscitate: Secondary | ICD-10-CM | POA: Diagnosis not present

## 2020-12-01 DIAGNOSIS — Z20822 Contact with and (suspected) exposure to covid-19: Secondary | ICD-10-CM | POA: Diagnosis not present

## 2020-12-01 DIAGNOSIS — J810 Acute pulmonary edema: Secondary | ICD-10-CM

## 2020-12-01 DIAGNOSIS — I517 Cardiomegaly: Secondary | ICD-10-CM | POA: Diagnosis not present

## 2020-12-01 DIAGNOSIS — Z86718 Personal history of other venous thrombosis and embolism: Secondary | ICD-10-CM | POA: Diagnosis not present

## 2020-12-01 DIAGNOSIS — Z955 Presence of coronary angioplasty implant and graft: Secondary | ICD-10-CM | POA: Diagnosis not present

## 2020-12-01 DIAGNOSIS — I444 Left anterior fascicular block: Secondary | ICD-10-CM | POA: Diagnosis not present

## 2020-12-01 DIAGNOSIS — J45909 Unspecified asthma, uncomplicated: Secondary | ICD-10-CM | POA: Diagnosis not present

## 2020-12-01 DIAGNOSIS — R609 Edema, unspecified: Secondary | ICD-10-CM | POA: Diagnosis not present

## 2020-12-01 DIAGNOSIS — I255 Ischemic cardiomyopathy: Secondary | ICD-10-CM | POA: Diagnosis not present

## 2020-12-01 DIAGNOSIS — N138 Other obstructive and reflux uropathy: Secondary | ICD-10-CM | POA: Diagnosis not present

## 2020-12-01 DIAGNOSIS — Z888 Allergy status to other drugs, medicaments and biological substances status: Secondary | ICD-10-CM | POA: Diagnosis not present

## 2020-12-01 DIAGNOSIS — I252 Old myocardial infarction: Secondary | ICD-10-CM | POA: Diagnosis not present

## 2020-12-01 DIAGNOSIS — R0600 Dyspnea, unspecified: Secondary | ICD-10-CM | POA: Diagnosis not present

## 2020-12-01 DIAGNOSIS — I499 Cardiac arrhythmia, unspecified: Secondary | ICD-10-CM | POA: Diagnosis not present

## 2020-12-01 DIAGNOSIS — Z79899 Other long term (current) drug therapy: Secondary | ICD-10-CM | POA: Diagnosis not present

## 2020-12-01 NOTE — ED Notes (Signed)
Patient is being discharged from the Urgent Care and sent to the Emergency Department via POV. Per Christene Slates, PA, patient is in need of higher level of care due to Pulmonary edema, and CHF. Patient is aware and verbalizes understanding of plan of care.  Vitals:   12/01/20 0926  BP: 114/74  Pulse: 89  Resp: 20  Temp: 97.8 F (36.6 C)  SpO2: 96%

## 2020-12-01 NOTE — ED Provider Notes (Addendum)
[facility]    CSN: [id number] Arrival date & time: 12/01/20  0908      History   Chief Complaint Chief Complaint  Patient presents with  . Shortness of Breath  . Joint Swelling    HPI Mark [name] is a 87 y.o. male presenting for 2-day history of "chest heaviness", shortness of breath/difficutly getting a complete deep breath, and bilateral swelling of the lower extremities.  Patient seen here recently on 11/21/2020 by me for fall and subsequent right rib injury that did not result in a fracture.  He denies any continued pain from that.  Patient has complicated past medical history significant for CHF with reduced ejection fraction to 20%, hypertension, coronary artery disease with previous stent placement, hyperlipidemia, previous MI, ischemic cardiomyopathy, asymptomatic thoracic aortic aneurysm.  Patient says he did recently see his cardiologist and had a stress test that looked good.  He saw the cardiologist in March 2022.  Presently, patient denies any radiation of the chest heaviness to the upper extremities, neck or jaw.  He denies any palpitations, dizziness or weakness.  He has not felt faint or passed out.  He says that he does not generally have leg swelling.  He says yesterday he did prop them up and the swelling improved but then he started to do some work around the house and they became swollen again.  He does not take a diuretic.  Patient says the legs are a little tender.  He thinks because they are swollen.  Of note, he does have a previous history of DVT of the right lower extremity.  He is not taking any anticoagulants.  Patient currently denies any fever, fatigue, body aches, cough, congestion, abdominal pain, nausea/vomiting or diarrhea.  No sick contacts and no known exposure to COVID-19.  Personal history of COVID-19 a little over a year ago.  He has no other complaints or concerns today.  HPI  Past Medical History:  Diagnosis Date  . Actinic  keratosis 11/20/2018   right proximal lateral bicep.  . Arthritis   . Atypical fibroxanthoma of skin of scalp 11/20/2018   Excised 12/02/2018. Deep margin involved. 2nd Excision 12/23/2018, no residual lesion, margins free  . Carotid artery disease ([place])   . Coronary artery disease 2005  . COVID-19 09/01/2019   symptoms resolved 09/05/19  . GERD (gastroesophageal reflux disease)   . Hyperkalemia   . Hyperlipidemia   . Hypertension   . Past heart attack 2005  . Squamous cell carcinoma of skin 08/21/2018   Left distal dorsum forearm. [facility]. [facility]    Patient Active Problem List   Diagnosis Date Noted  . Cardiac syncope 09/22/2019  . Respiratory tract infection due to COVID-19 virus 09/04/2019  . 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 Senile purpura ([place]) 09/29/2018  . Atherosclerosis of aorta ([place]) 09/29/2018  . Hardening of the aorta (main artery of the heart) ([place]) 09/29/2018  . Abnormal ECG 06/16/2018  . Thoracic aortic aneurysm without rupture ([place]) 05/28/2018  . CHF (congestive heart failure), NYHA class III, chronic, systolic ([place]) 72/53/6644  . 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 Ischemic cardiomyopathy 06/11/2017  . Benign essential HTN 05/22/2017  . Bilateral carotid artery stenosis 05/22/2017  . Neuropathy 01/28/2017  . Right leg paresthesias 11/26/2016  . Hypertension 08/27/2016  . GERD (gastroesophageal reflux disease) 08/27/2016  . Hyperglycemia 05/10/2016  . History of nonmelanoma skin cancer 10/10/2015  . Pain in right shoulder 09/13/2015  . Osteoarthritis 07/06/2015  .  Lymphedema 05/30/2015  . Other specified soft tissue disorders 05/30/2015  . Hyperlipidemia 03/29/2015  . Family history of type 2 diabetes mellitus in father 03/29/2015  . DD (diverticular disease) 02/03/2015  . Diverticulosis of intestine without perforation or abscess without bleeding 02/03/2015  . DDD (degenerative disc disease), lumbar 03/03/2014  . Intervertebral  disc disorder with radiculopathy of lumbar region 03/03/2014  . Lumbar radiculitis 03/03/2014  . Lumbar stenosis with neurogenic claudication 03/03/2014  . Arteriosclerosis of coronary artery 04/18/2012  . S/P coronary artery stent placement 04/18/2012  . Status post percutaneous transluminal coronary angioplasty 04/18/2012    Past Surgical History:  Procedure Laterality Date  . CATARACT EXTRACTION W/PHACO Right 11/24/2019   Procedure: CATARACT EXTRACTION PHACO AND INTRAOCULAR LENS PLACEMENT (IOC) RIGHT MALYUGIN 8.59 00:50.8;  Surgeon: Birder Robson, MD;  Location: [place];  Service: Ophthalmology;  Laterality: Right;  COVID ( + ) 09-01-19  . CATARACT EXTRACTION W/PHACO Left 12/15/2019   Procedure: CATARACT EXTRACTION PHACO AND INTRAOCULAR LENS PLACEMENT (IOC) LEFT 9.90  01:03.7;  Surgeon: Birder Robson, MD;  Location: [place];  Service: Ophthalmology;  Laterality: Left;  . CORONARY ANGIOPLASTY WITH STENT PLACEMENT  2005   [facility]       Home Medications    Prior to Admission medications   Medication Sig Start Date End Date Taking? Authorizing Provider  acetaminophen (TYLENOL) 500 MG tablet Take 500 mg by mouth every 6 (six) hours as needed.   Yes [provider]  albuterol (VENTOLIN HFA) 108 (90 Base) MCG/ACT inhaler USE 2 INHALATIONS BY MOUTH  EVERY 6 HOURS AS NEEDED FOR WHEEZING OR SHORTNESS OF  BREATH 09/27/20  Yes Sowles, Drue Stager, MD  aspirin EC 81 MG tablet Take 81 mg by mouth daily.    Yes [provider]  atorvastatin (LIPITOR) 10 MG tablet TAKE 1 TABLET BY MOUTH  DAILY 10/24/20  Yes Sowles, Drue Stager, MD  diclofenac Sodium (VOLTAREN) 1 % GEL Apply 4 g topically 4 (four) times daily. 09/21/20  Yes Sowles, Drue Stager, MD  doxycycline (MONODOX) 50 MG capsule Take 1 capsule (50 mg total) by mouth daily. Take with food and drink 09/21/20  Yes Ralene Bathe, MD  gabapentin (NEURONTIN) 400 MG capsule TAKE 1 CAPSULE THREE TIMES DAILY 05/16/20  Yes  Sowles, Drue Stager, MD  Loratadine 10 MG CAPS Take 10 mg by mouth daily.    Yes [provider]  montelukast (SINGULAIR) 10 MG tablet TAKE 1 TABLET BY MOUTH AT  BEDTIME 10/24/20  Yes Sowles, Drue Stager, MD  pantoprazole (PROTONIX) 40 MG tablet TAKE 1 TABLET BY MOUTH  DAILY 10/24/20  Yes Sowles, Drue Stager, MD  Probiotic Product (PROBIOTIC PO) Take by mouth daily.    Yes [provider]  tamsulosin (FLOMAX) 0.4 MG CAPS capsule Take 1 capsule (0.4 mg total) by mouth daily. 09/21/20  Yes Steele Sizer, MD    Family History Family History  Problem Relation Age of Onset  . Hypertension Mother   . Heart disease Mother   . Heart disease Father   . Hypertension Father     Social History Social History   Tobacco Use  . Smoking status: Former Smoker    Quit date: 05/16/1963    Years since quitting: 57.5  . Smokeless tobacco: Never Used  . Tobacco comment: quit over 50 years ago  Vaping Use  . Vaping Use: Never used  Substance Use Topics  . Alcohol use: No  . Drug use: No     Allergies   Gramineae pollens, Cat  hair extract, Breo ellipta [fluticasone furoate-vilanterol], and Rosuvastatin   Review of Systems Review of Systems  Constitutional: Negative for diaphoresis, fatigue and fever.  HENT: Negative for congestion, rhinorrhea and sore throat.   Respiratory: Positive for shortness of breath and wheezing. Negative for cough.   Cardiovascular: Positive for chest pain and leg swelling. Negative for palpitations.  Gastrointestinal: Negative for abdominal pain, nausea and vomiting.  Musculoskeletal: Negative for back pain.  Skin: Negative for color change.  Neurological: Negative for dizziness, syncope, weakness and headaches.     Physical Exam Triage Vital Signs ED Triage Vitals  Enc Vitals Group     BP 12/01/20 0926 114/74     Pulse Rate 12/01/20 0926 89     Resp 12/01/20 0926 20     Temp 12/01/20 0926 97.8 F (36.6 C)     Temp Source 12/01/20 0926 Temporal      SpO2 12/01/20 0926 96 %     Weight 12/01/20 0927 190 lb 0.6 oz (86.2 kg)     Height 12/01/20 0927 5\' 11"  (1.803 m)     Head Circumference --      Peak Flow --      Pain Score 12/01/20 0927 5     Pain Loc --      Pain Edu? --      Excl. in [place]? --    No data found.  Updated Vital Signs BP 114/74 (BP Location: Left Arm)   Pulse 89   Temp 97.8 F (36.6 C) (Temporal)   Resp 20   Ht 5\' 11"  (1.803 m)   Wt 190 lb 0.6 oz (86.2 kg)   SpO2 96%   BMI 26.50 kg/m        Physical Exam Vitals and nursing note reviewed.  Constitutional:      General: He is not in acute distress.    Appearance: Normal appearance. He is well-developed. He is not ill-appearing or diaphoretic.  HENT:     Head: Normocephalic and atraumatic.     Nose: Nose normal.     Mouth/Throat:     Mouth: Mucous membranes are moist.     Pharynx: Oropharynx is clear.  Eyes:     General: No scleral icterus.    Conjunctiva/sclera: Conjunctivae normal.  Cardiovascular:     Rate and Rhythm: Normal rate and regular rhythm.     Heart sounds: Normal heart sounds.  Pulmonary:     Effort: Pulmonary effort is normal. No respiratory distress.     Breath sounds: Examination of the right-middle field reveals wheezing. Examination of the left-middle field reveals wheezing. Examination of the right-lower field reveals wheezing. Examination of the left-lower field reveals wheezing. Wheezing present.  Chest:     Chest wall: No tenderness.  Abdominal:     Palpations: Abdomen is soft.     Tenderness: There is no abdominal tenderness.  Musculoskeletal:     Cervical back: Neck supple.     Right lower leg: Edema (1+ pitting edema to mid shin) present.     Left lower leg: Edema (1+ pitting edema to mid shin) present.     Comments: He does have some tenderness of the lower extremities.  There are no skin color changes or increased warmth.  Skin:    General: Skin is warm and dry.  Neurological:     General: No focal deficit present.      Mental Status: He is alert. Mental status is at baseline.     Motor: No weakness.  Coordination: Coordination normal.     Gait: Gait normal.  Psychiatric:        Mood and Affect: Mood normal.        Behavior: Behavior normal.        Thought Content: Thought content normal.      UC Treatments / Results  Labs (all labs ordered are listed, but only abnormal results are displayed) Labs Reviewed - No data to display  EKG   Radiology DG Chest 2 View  Result Date: 12/01/2020 CLINICAL DATA:  Shortness of breath and swelling of the lower extremities. EXAM: CHEST - 2 VIEW COMPARISON:  11/21/2020 FINDINGS: Heart size upper limits of normal. Tortuous aorta as seen previously. Mild interstitial edema with a small amount of fluid in the fissures. No consolidation, collapse or measurable effusion. Ordinary degenerative changes affect the spine. IMPRESSION: Borderline cardiomegaly. Aortic atherosclerosis. Interstitial edema with a small amount fluid in the fissures, consistent with low level congestive heart failure. Electronically Signed   By: Nelson Chimes M.D.   On: 12/01/2020 10:07    Procedures ED EKG  Date/Time: 12/01/2020 10:56 AM Performed by: Danton Clap, PA-C Authorized by: Danton Clap, PA-C   ECG reviewed by ED Physician in the absence of a cardiologist: yes   Previous ECG:    Previous ECG:  Compared to current   Similarity:  No change Interpretation:    Interpretation: abnormal   Rate:    ECG rate:  90   ECG rate assessment: normal   Rhythm:    Rhythm: sinus rhythm   QRS:    Details:  Left anterior fascicular block  Comments:     Normal sinus rhythm, regular rate. Left anterior fascicular block. Anteroseptal defect (has been persistent--seen in 2021 EKG)   (including critical care time)  Medications Ordered in UC Medications - No data to display  Initial Impression / Assessment and Plan / UC Course  I have reviewed the triage vital signs and the nursing  notes.  Pertinent labs & imaging results that were available during my care of the patient were reviewed by me and considered in my medical decision making (see chart for details).    87 year old male presenting for chest pressure and shortness of breath as well as lower extremity edema bilaterally for the past 2 days.  Patient has complicated past medical history significant for CHF with reduced ejection fraction to 20%, hypertension, coronary artery disease with previous stent placement, hyperlipidemia, previous MI, ischemic cardiomyopathy, asymptomatic thoracic aortic aneurysm.  Vital signs are all stable.  He does have 1+ pitting edema to mid tibia.  He has some wheezing of the lungs throughout which is worse in the lower lung fields.  He is not having any respiratory distress and overall looks well though.  Chest x-ray is significant for small amount of fluid in the fissures consistent with low-level congestive heart failure.  Patient advised that he is having an acute exacerbation of his CHF.  He assures me that he has only had to have a diuretic 1 time and does not normally have any swelling in his extremities.  He did recently have a follow-up with his cardiologist on 10/18/20.  I did review this note.  Given patient's extensive medical history, advised him that he needs work-up for what could possibly be causing the CHF exacerbation.  Advised if the work-up comes back as insignificant then he may just receive some IV Lasix and be sent home.  Patient plans to go to [facility] ED at this time.  He says that his son will drive him.  He did [place] any EMS transport.  He is leaving in stable condition.   Final Clinical Impressions(s) / UC Diagnoses   Final diagnoses:  Acute on chronic congestive heart failure, unspecified heart failure type ([place])  Peripheral edema  Acute pulmonary edema (HCC)  SOB (shortness of breath)     Discharge Instructions     *You have an significant amount of  fluid all the way up to the middle of your lower leg and fluid in your lungs.  You are having an exacerbation of your heart failure and need further work-up to determine the cause.  You have been advised to follow up immediately in the emergency department for concerning signs.symptoms. If you declined EMS transport, please have a family member take you directly to the ED at this time. Do not delay. Based on concerns about condition, if you do not follow up in th e ED, you may risk poor outcomes including worsening of condition, delayed treatment and potentially life threatening issues. If you have declined to go to the ED at this time, you should call your PCP immediately to set up a follow up appointment.  Go to ED for red flag symptoms, including; fevers you cannot reduce with Tylenol/Motrin, severe headaches, vision changes, numbness/weakness in part of the body, lethargy, confusion, intractable vomiting, severe dehydration, chest pain, breathing difficulty, severe persistent abdominal or pelvic pain, signs of severe infection (increased redness, swelling of an area), feeling faint or passing out, dizziness, etc. You should especially go to the ED for sudden acute worsening of condition if you do not elect to go at this time.     ED Prescriptions    None     PDMP not reviewed this encounter.   Danton Clap, PA-C 12/01/20 1059    Laurene Footman B, PA-C 12/01/20 1100

## 2020-12-01 NOTE — ED Triage Notes (Signed)
Pt c/o shortness of breath, and bilateral ankle swelling. Started about 2 days ago. Pt recently here for rib pain from fall. He states he feels like his chest was heavy this morning. Has h/o heart disease.

## 2020-12-01 NOTE — Discharge Instructions (Addendum)
*  You have an significant amount of fluid all the way up to the middle of your lower leg and fluid in your lungs.  You are having an exacerbation of your heart failure and need further work-up to determine the cause.  You have been advised to follow up immediately in the emergency department for concerning signs.symptoms. If you declined EMS transport, please have a family member take you directly to the ED at this time. Do not delay. Based on concerns about condition, if you do not follow up in th e ED, you may risk poor outcomes including worsening of condition, delayed treatment and potentially life threatening issues. If you have declined to go to the ED at this time, you should call your PCP immediately to set up a follow up appointment.  Go to ED for red flag symptoms, including; fevers you cannot reduce with Tylenol/Motrin, severe headaches, vision changes, numbness/weakness in part of the body, lethargy, confusion, intractable vomiting, severe dehydration, chest pain, breathing difficulty, severe persistent abdominal or pelvic pain, signs of severe infection (increased redness, swelling of an area), feeling faint or passing out, dizziness, etc. You should especially go to the ED for sudden acute worsening of condition if you do not elect to go at this time.

## 2020-12-02 DIAGNOSIS — I5020 Unspecified systolic (congestive) heart failure: Secondary | ICD-10-CM | POA: Insufficient documentation

## 2020-12-05 ENCOUNTER — Telehealth: Payer: Self-pay

## 2020-12-05 NOTE — Telephone Encounter (Signed)
Left voicemail with verbal order and call back instructions if they need anything further.

## 2020-12-05 NOTE — Telephone Encounter (Signed)
Copied from [place] [id number]. Topic: Quick Communication - Home Health Verbal Orders >> Dec 02, 2020  5:46 PM Pawlus, Apolonio Schneiders wrote: [facility] Rn requested verbal orders to proceed with PT for the patient, Please advise [phone]

## 2020-12-05 NOTE — Telephone Encounter (Signed)
Transition Care Management Follow-up Telephone Call  Date of discharge and from where: 12/02/20 [facility]  How have you been since you were released from the hospital? Pt states he's doing okay  Any questions or concerns? No  Items Reviewed:  Did the pt receive and understand the discharge instructions provided? Yes   Medications obtained and verified? Yes   Other? No   Any new allergies since your discharge? No   Dietary orders reviewed? Yes  Do you have support at home? Yes   Home Care and Equipment/Supplies: Were home health services ordered? yes If so, what is the name of the agency? [facility] agency set up a time to come to the patient's home? no Were any new equipment or medical supplies ordered?  No  Functional Questionnaire: (I = Independent and D = Dependent) ADLs: i  Bathing/Dressing- I  Meal Prep- I  Eating- I  Maintaining continence- I  Transferring/Ambulation- I  Managing Meds- I  Follow up appointments reviewed:   PCP Hospital f/u appt confirmed? No  pt advised will be contacted for appt if needed with PCP  [facility] f/u appt confirmed? Yes  Scheduled to see cardiology on 12/08/20.  Are transportation arrangements needed? No   If their condition worsens, is the pt aware to call PCP or go to the Emergency Dept.? Yes  Was the patient provided with contact information for the PCP's office or ED? Yes  Was to pt encouraged to call back with questions or concerns? Yes

## 2020-12-06 NOTE — Telephone Encounter (Signed)
Pt is seeing cardiologist on Thursday for a HFU. Does he still need one for here?

## 2020-12-06 NOTE — Telephone Encounter (Signed)
He has an appt with you on 12/30/20

## 2020-12-08 DIAGNOSIS — I5022 Chronic systolic (congestive) heart failure: Secondary | ICD-10-CM | POA: Diagnosis not present

## 2020-12-08 DIAGNOSIS — I25118 Atherosclerotic heart disease of native coronary artery with other forms of angina pectoris: Secondary | ICD-10-CM | POA: Diagnosis not present

## 2020-12-08 DIAGNOSIS — I10 Essential (primary) hypertension: Secondary | ICD-10-CM | POA: Diagnosis not present

## 2020-12-08 DIAGNOSIS — E7800 Pure hypercholesterolemia, unspecified: Secondary | ICD-10-CM | POA: Diagnosis not present

## 2020-12-09 ENCOUNTER — Telehealth: Payer: Self-pay | Admitting: Family Medicine

## 2020-12-09 DIAGNOSIS — I890 Lymphedema, not elsewhere classified: Secondary | ICD-10-CM | POA: Diagnosis not present

## 2020-12-09 DIAGNOSIS — M19011 Primary osteoarthritis, right shoulder: Secondary | ICD-10-CM | POA: Diagnosis not present

## 2020-12-09 DIAGNOSIS — I25118 Atherosclerotic heart disease of native coronary artery with other forms of angina pectoris: Secondary | ICD-10-CM | POA: Diagnosis not present

## 2020-12-09 DIAGNOSIS — I255 Ischemic cardiomyopathy: Secondary | ICD-10-CM | POA: Diagnosis not present

## 2020-12-09 DIAGNOSIS — J8410 Pulmonary fibrosis, unspecified: Secondary | ICD-10-CM | POA: Diagnosis not present

## 2020-12-09 DIAGNOSIS — M479 Spondylosis, unspecified: Secondary | ICD-10-CM | POA: Diagnosis not present

## 2020-12-09 DIAGNOSIS — I700 Atherosclerosis of aorta: Secondary | ICD-10-CM | POA: Diagnosis not present

## 2020-12-09 DIAGNOSIS — I444 Left anterior fascicular block: Secondary | ICD-10-CM | POA: Diagnosis not present

## 2020-12-09 DIAGNOSIS — I6523 Occlusion and stenosis of bilateral carotid arteries: Secondary | ICD-10-CM | POA: Diagnosis not present

## 2020-12-09 DIAGNOSIS — M48062 Spinal stenosis, lumbar region with neurogenic claudication: Secondary | ICD-10-CM | POA: Diagnosis not present

## 2020-12-09 DIAGNOSIS — M5116 Intervertebral disc disorders with radiculopathy, lumbar region: Secondary | ICD-10-CM | POA: Diagnosis not present

## 2020-12-09 DIAGNOSIS — S46112D Strain of muscle, fascia and tendon of long head of biceps, left arm, subsequent encounter: Secondary | ICD-10-CM | POA: Diagnosis not present

## 2020-12-09 DIAGNOSIS — M7989 Other specified soft tissue disorders: Secondary | ICD-10-CM | POA: Diagnosis not present

## 2020-12-09 DIAGNOSIS — N138 Other obstructive and reflux uropathy: Secondary | ICD-10-CM | POA: Diagnosis not present

## 2020-12-09 DIAGNOSIS — M75102 Unspecified rotator cuff tear or rupture of left shoulder, not specified as traumatic: Secondary | ICD-10-CM | POA: Diagnosis not present

## 2020-12-09 DIAGNOSIS — J45909 Unspecified asthma, uncomplicated: Secondary | ICD-10-CM | POA: Diagnosis not present

## 2020-12-09 DIAGNOSIS — I5023 Acute on chronic systolic (congestive) heart failure: Secondary | ICD-10-CM | POA: Diagnosis not present

## 2020-12-09 DIAGNOSIS — M5136 Other intervertebral disc degeneration, lumbar region: Secondary | ICD-10-CM | POA: Diagnosis not present

## 2020-12-09 DIAGNOSIS — E785 Hyperlipidemia, unspecified: Secondary | ICD-10-CM | POA: Diagnosis not present

## 2020-12-09 DIAGNOSIS — I5020 Unspecified systolic (congestive) heart failure: Secondary | ICD-10-CM | POA: Diagnosis not present

## 2020-12-09 DIAGNOSIS — I252 Old myocardial infarction: Secondary | ICD-10-CM | POA: Diagnosis not present

## 2020-12-09 DIAGNOSIS — I440 Atrioventricular block, first degree: Secondary | ICD-10-CM | POA: Diagnosis not present

## 2020-12-09 DIAGNOSIS — I712 Thoracic aortic aneurysm, without rupture: Secondary | ICD-10-CM | POA: Diagnosis not present

## 2020-12-09 DIAGNOSIS — G629 Polyneuropathy, unspecified: Secondary | ICD-10-CM | POA: Diagnosis not present

## 2020-12-09 DIAGNOSIS — I110 Hypertensive heart disease with heart failure: Secondary | ICD-10-CM | POA: Diagnosis not present

## 2020-12-09 DIAGNOSIS — R739 Hyperglycemia, unspecified: Secondary | ICD-10-CM | POA: Diagnosis not present

## 2020-12-09 NOTE — Telephone Encounter (Signed)
Gillie Manners PT with [facility]- went out for a 1 time eval bc he is not homebound. Barnetta Chapel feels that the pt can benefit from a cane but the pt denies wanting a can. [phone]

## 2020-12-29 DIAGNOSIS — M25552 Pain in left hip: Secondary | ICD-10-CM | POA: Diagnosis not present

## 2020-12-29 DIAGNOSIS — M79662 Pain in left lower leg: Secondary | ICD-10-CM | POA: Diagnosis not present

## 2020-12-30 ENCOUNTER — Other Ambulatory Visit: Payer: Self-pay

## 2020-12-30 ENCOUNTER — Ambulatory Visit (INDEPENDENT_AMBULATORY_CARE_PROVIDER_SITE_OTHER): Payer: Medicare Other | Admitting: Family Medicine

## 2020-12-30 ENCOUNTER — Encounter: Payer: Self-pay | Admitting: Family Medicine

## 2020-12-30 VITALS — BP 118/72 | HR 76 | Temp 97.8°F | Resp 16 | Ht 70.0 in | Wt 175.0 lb

## 2020-12-30 DIAGNOSIS — G629 Polyneuropathy, unspecified: Secondary | ICD-10-CM | POA: Diagnosis not present

## 2020-12-30 DIAGNOSIS — I771 Stricture of artery: Secondary | ICD-10-CM | POA: Diagnosis not present

## 2020-12-30 DIAGNOSIS — Z0000 Encounter for general adult medical examination without abnormal findings: Secondary | ICD-10-CM

## 2020-12-30 DIAGNOSIS — E7800 Pure hypercholesterolemia, unspecified: Secondary | ICD-10-CM

## 2020-12-30 DIAGNOSIS — E441 Mild protein-calorie malnutrition: Secondary | ICD-10-CM

## 2020-12-30 DIAGNOSIS — R739 Hyperglycemia, unspecified: Secondary | ICD-10-CM

## 2020-12-30 DIAGNOSIS — I5022 Chronic systolic (congestive) heart failure: Secondary | ICD-10-CM

## 2020-12-30 DIAGNOSIS — I10 Essential (primary) hypertension: Secondary | ICD-10-CM

## 2020-12-30 DIAGNOSIS — D692 Other nonthrombocytopenic purpura: Secondary | ICD-10-CM | POA: Diagnosis not present

## 2020-12-30 DIAGNOSIS — I700 Atherosclerosis of aorta: Secondary | ICD-10-CM

## 2020-12-30 DIAGNOSIS — I712 Thoracic aortic aneurysm, without rupture, unspecified: Secondary | ICD-10-CM

## 2020-12-30 DIAGNOSIS — M170 Bilateral primary osteoarthritis of knee: Secondary | ICD-10-CM

## 2020-12-30 DIAGNOSIS — N138 Other obstructive and reflux uropathy: Secondary | ICD-10-CM

## 2020-12-30 DIAGNOSIS — J3089 Other allergic rhinitis: Secondary | ICD-10-CM | POA: Diagnosis not present

## 2020-12-30 DIAGNOSIS — N401 Enlarged prostate with lower urinary tract symptoms: Secondary | ICD-10-CM

## 2020-12-30 DIAGNOSIS — K219 Gastro-esophageal reflux disease without esophagitis: Secondary | ICD-10-CM

## 2020-12-30 DIAGNOSIS — R634 Abnormal weight loss: Secondary | ICD-10-CM | POA: Diagnosis not present

## 2020-12-30 DIAGNOSIS — J8410 Pulmonary fibrosis, unspecified: Secondary | ICD-10-CM

## 2020-12-30 MED ORDER — POTASSIUM CHLORIDE CRYS ER 20 MEQ PO TBCR: 20.0000 meq | EXTENDED_RELEASE_TABLET | Freq: Every day | ORAL | 1 refills | Status: DC

## 2020-12-30 MED ORDER — ATORVASTATIN CALCIUM 10 MG PO TABS
1.0000 | ORAL_TABLET | Freq: Every day | ORAL | 1 refills | Status: DC
Start: 2020-12-30 — End: 2021-07-04

## 2020-12-30 MED ORDER — JARDIANCE 10 MG PO TABS: 10.0000 mg | ORAL_TABLET | Freq: Every day | ORAL | 1 refills | Status: DC

## 2020-12-30 MED ORDER — DICLOFENAC SODIUM 1 % EX GEL: 4.0000 g | Freq: Four times a day (QID) | CUTANEOUS | 1 refills | Status: DC

## 2020-12-30 MED ORDER — FUROSEMIDE 20 MG PO TABS: 20.0000 mg | ORAL_TABLET | Freq: Every day | ORAL | 1 refills | Status: DC

## 2020-12-30 MED ORDER — PANTOPRAZOLE SODIUM 40 MG PO TBEC: 1.0000 | DELAYED_RELEASE_TABLET | Freq: Every day | ORAL | 0 refills | Status: DC

## 2020-12-30 MED ORDER — GABAPENTIN 100 MG PO CAPS: 100.0000 mg | ORAL_CAPSULE | Freq: Three times a day (TID) | ORAL | 0 refills | Status: DC

## 2020-12-30 MED ORDER — MONTELUKAST SODIUM 10 MG PO TABS: 1.0000 | ORAL_TABLET | Freq: Every day | ORAL | 1 refills | Status: DC

## 2020-12-30 NOTE — Progress Notes (Signed)
Name: Mark [name]   MRN: [id number]    DOB: Jan 09, 1929   Date:12/30/2020       Progress Note  Subjective  Chief Complaint  Annual Exam  HPI  Patient presents for annual CPE and follow up  CHF: he is under the care of Dr. Nehemiah Massed. He had syncope earlier in 2021 seen by Dr. Scarlette Ar,. He was at [facility] for 6 months, but moved back June 1st 2020because he likes doing things around the house. He has SOB with moderate activity ( like going up the stairs),he denies orthopnea. He status post STEMI and PCI with stent to mid LAD in 2005 .He had repeat Echo and Stress Myoview, previous Echo was 40 % and is down to 20-30 %. He went to [facility] for increase in SOB early May, was given Jardiance and lasix and is doing better now. He states leg swelling has improved   Last Echo was done 10/13/2019   INTERPRETATION SEVERE LV SYSTOLIC DYSFUNCTION (See above)   WITH MILD LVH NORMAL RIGHT VENTRICULAR SYSTOLIC FUNCTION MILD VALVULAR REGURGITATION (See above) NO VALVULAR STENOSIS MILD AR, MR, TR EF 25-30%  Last Myoview stress test 10/13/2019  Regional wall motion: demonstrates akinesis of the Apical and distal  inferior myocardium. The overall quality of the study is good.  Artifacts noted: yes  Left ventricular cavity: enlarged.  Perfusion Analysis: SPECT images demonstrate large perfusion abnormality  of severe intensity is present in the apical myocardial region. There is  a moderate perfusion abnormality of moderate intensity of the inferior  myocardial region on the stress images. Resting images show unchanged  abnormal perfusion abnormalities of stress portion. This suggests no  evidence of myocardial ischemia and previous myocardial infarction.  Defect type: Fixed   GERD: taking Pantoprazole and symptoms are controlled at this time, no heartburn or regurgitation. He is trying to take it every other day and seems to be controlling symptoms   MWN:UUVOZ  well on Flomax, he states no longer having to void at night. Discussed PSA and we decided not to do   Lung nodules/pulmonary fibrosis : seen by Dr. Mortimer Fries. He states he does not want repeat studies, last study was 01/2019. CT chest done 2019 showed some pulmonary fibrosis, he used to smoke but not currently , he quit smoking over 50 years ago .No cough or clearing throat anymore doing well on singulairand loratadine , sometimes uses Ventolin when active outside, no problems when indoors   AR: denies sneezing, rhinorrhea or cough . He states no longer using nasal sprayHe is on singulair. He is doing well   Atherosclerosis aorta/Aneurysm of thoracic aorta: discussed results with patient, he is not interested in changing management , also discussed aneurysm of aorta, guidelines recommends repeat US in 2024. He is still taking Atorvastatin, he is under the care of Dr. Erven Colla  and has yearly follow up  History of skin cancer: under the care of Dr. Gwen Her new lesions. Unchanged   DDDlumbar spine and radiculitis: he has numbness and tingling on right leg only and it affects his walk at times, occasionally numbness on left lower leg. He has been off gabapentin and has noticed worsening pain on left lower leg at night / she states it is a constant , intense pain, unable to describe only at night, right lower leg is always numb or tingling. He would like to resume gabapentin, he was on 400 mg tid, but we will titrate up slowly   OA: both knees,  left shoulder and hands, needs refill of Voltaren gel  Malnutrition: he has lost 15 lbs, advised him to come in sooner if continues to lose weight.   IPSS Questionnaire (AUA-7): Over the past month.   1)  How often have you had a sensation of not emptying your bladder completely after you finish urinating?  0 - Not at all  2)  How often have you had to urinate again less than two hours after you finished urinating? 1 - Less than 1 time in 5  3)   How often have you found you stopped and started again several times when you urinated?  0 - Not at all  4) How difficult have you found it to postpone urination?  0 - Not at all  5) How often have you had a weak urinary stream?  0 - Not at all  6) How often have you had to push or strain to begin urination?  0 - Not at all  7) How many times did you most typically get up to urinate from the time you went to bed until the time you got up in the morning?  1 - 1 time  Total score:  0-7 mildly symptomatic   8-19 moderately symptomatic   20-35 severely symptomatic     Diet: he eats three meals a day, cereal/yogurt, sandwich for lunch and kids takes him dinner . Explained he has lost 15 lbs, he states he has been in the 170 lbs for a while.  Exercise: he has been active   Depression: phq 9 is negative Depression screen [facility] 2/9 12/30/2020 11/17/2020 07/20/2020 06/28/2020 12/25/2019  Decreased Interest 2 0 0 0 0  Down, Depressed, Hopeless 0 0 0 0 0  PHQ - 2 Score 2 0 0 0 0  Altered sleeping 0 - - - 0  Tired, decreased energy 3 - - - 0  Change in appetite 0 - - - 0  Feeling bad or failure about yourself  0 - - - 0  Trouble concentrating 0 - - - 0  Moving slowly or fidgety/restless 0 - - - 0  Suicidal thoughts 0 - - - 0  PHQ-9 Score 5 - - - 0  Difficult doing work/chores - - - - -  Some recent data might be hidden    Hypertension:  BP Readings from Last 3 Encounters:  12/30/20 118/72  12/01/20 114/74  11/21/20 113/76    Obesity: Wt Readings from Last 3 Encounters:  12/30/20 175 lb (79.4 kg)  12/01/20 190 lb 0.6 oz (86.2 kg)  06/28/20 190 lb (86.2 kg)   BMI Readings from Last 3 Encounters:  12/30/20 25.11 kg/m  12/01/20 26.50 kg/m  06/28/20 26.50 kg/m     Lipids:  Lab Results  Component Value Date   CHOL 121 12/25/2019   CHOL 120 02/12/2019   CHOL 144 11/21/2017   Lab Results  Component Value Date   HDL 40 12/25/2019   HDL 39 (L) 02/12/2019   HDL 35 (L) 11/21/2017    Lab Results  Component Value Date   LDLCALC 66 12/25/2019   LDLCALC 64 02/12/2019   LDLCALC 87 11/21/2017   Lab Results  Component Value Date   TRIG 73 12/25/2019   TRIG 89 02/12/2019   TRIG 119 11/21/2017   Lab Results  Component Value Date   CHOLHDL 3.0 12/25/2019   CHOLHDL 3.1 02/12/2019   CHOLHDL 4.1 11/21/2017   No results found for: LDLDIRECT Glucose:  Glucose, Bld  Date Value Ref Range Status  12/25/2019 101 (H) 65 - 99 mg/dL Final    Comment:    .            Fasting reference interval . For someone without known diabetes, a glucose value between 100 and 125 mg/dL is consistent with prediabetes and should be confirmed with a follow-up test. .   02/12/2019 86 65 - 99 mg/dL Final    Comment:    .            Fasting reference interval .   09/29/2018 87 65 - 99 mg/dL Final    Comment:    .            Fasting reference interval .    Glucose-Capillary  Date Value Ref Range Status  09/14/2019 100 (H) 70 - 99 mg/dL Final    Flowsheet Row Clinical Support from 11/17/2020 in [facility]  AUDIT-C Score 0      Widowed STD testing and prevention (HIV/chl/gon/syphilis): not interested Hep C: N/A  Skin cancer: under the care of Dr. Nehemiah Massed  Colorectal cancer: he is 47 and not indicated  Prostate cancer: he has been released from urologist    Lung cancer:  Low Dose CT Chest recommended if Age 14-80 years, 30 pack-year currently smoking OR have quit w/in 15years. Patient does not qualify.   AAA: under the care of vascular surgeon, last CT angiogram was in 2020  ECG:  12/01/20  Vaccines:  Shingrix: 50-64 yo and ask insurance if covered when patient above 62 yo Pneumonia: educated and discussed with patient. Flu: educated and discussed with patient.  Advanced Care Planning: A voluntary discussion about advance care planning including the explanation and discussion of advance directives.  Discussed health care proxy and Living will,  and the patient was able to identify a health care proxy as Nickolai Rinks   Patient Active Problem List   Diagnosis Date Noted  . HFrEF (heart failure with reduced ejection fraction) ([place]) 12/02/2020  . Cardiac syncope 09/22/2019  . Respiratory tract infection due to COVID-19 virus 09/04/2019  . Fibrosis of lung ([place]) 09/29/2018  . Senile purpura ([place]) 09/29/2018  . Atherosclerosis of aorta ([place]) 09/29/2018  . Hardening of the aorta (main artery of the heart) ([place]) 09/29/2018  . Abnormal ECG 06/16/2018  . Thoracic aortic aneurysm without rupture ([place]) 05/28/2018  . CHF (congestive heart failure), NYHA class III, chronic, systolic ([place]) 02/63/7858  . 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 Ischemic cardiomyopathy 06/11/2017  . Benign essential HTN 05/22/2017  . Bilateral carotid artery stenosis 05/22/2017  . Neuropathy 01/28/2017  . Right leg paresthesias 11/26/2016  . Hypertension 08/27/2016  . GERD (gastroesophageal reflux disease) 08/27/2016  . Hyperglycemia 05/10/2016  . History of nonmelanoma skin cancer 10/10/2015  . Pain in right shoulder 09/13/2015  . Osteoarthritis 07/06/2015  . Lymphedema 05/30/2015  . Other specified soft tissue disorders 05/30/2015  . Hyperlipidemia 03/29/2015  . Family history of type 2 diabetes mellitus in father 03/29/2015  . DD (diverticular disease) 02/03/2015  . Diverticulosis of intestine without perforation or abscess without bleeding 02/03/2015  . DDD (degenerative disc disease), lumbar 03/03/2014  . Intervertebral disc disorder with radiculopathy of lumbar region 03/03/2014  . Lumbar radiculitis 03/03/2014  . Lumbar stenosis with neurogenic claudication 03/03/2014  . Arteriosclerosis of coronary artery 04/18/2012  . S/P coronary artery stent placement 04/18/2012  . Status post percutaneous transluminal coronary angioplasty 04/18/2012    Past Surgical History:  Procedure Laterality Date  . CATARACT EXTRACTION  W/PHACO Right 11/24/2019   Procedure: CATARACT EXTRACTION PHACO AND INTRAOCULAR LENS PLACEMENT (IOC) RIGHT MALYUGIN 8.59 00:50.8;  Surgeon: Birder Robson, MD;  Location: [place];  Service: Ophthalmology;  Laterality: Right;  COVID ( + ) 09-01-19  . CATARACT EXTRACTION W/PHACO Left 12/15/2019   Procedure: CATARACT EXTRACTION PHACO AND INTRAOCULAR LENS PLACEMENT (IOC) LEFT 9.90  01:03.7;  Surgeon: Birder Robson, MD;  Location: [place];  Service: Ophthalmology;  Laterality: Left;  . CORONARY ANGIOPLASTY WITH STENT PLACEMENT  2005   [facility]    Family History  Problem Relation Age of Onset  . Hypertension Mother   . Heart disease Mother   . Heart disease Father   . Hypertension Father     Social History   Socioeconomic History  . Marital status: Widowed    Spouse name: Not on file  . Number of children: 3  . Years of education: Not on file  . Highest education level: 10th grade  Occupational History  . Occupation: retired   Tobacco Use  . Smoking status: Former Smoker    Quit date: 05/16/1963    Years since quitting: 57.6  . Smokeless tobacco: Never Used  . Tobacco comment: quit over 50 years ago  Vaping Use  . Vaping Use: Never used  Substance and Sexual Activity  . Alcohol use: No  . Drug use: No  . Sexual activity: Not Currently    Partners: Female  Other Topics Concern  . Not on file  Social History Narrative   Pt lives alone.    Social Determinants of Health   Financial Resource Strain: Low Risk   . Difficulty of Paying Living Expenses: Not hard at all  Food Insecurity: No Food Insecurity  . Worried About Charity fundraiser in the Last Year: Never true  . Ran Out of Food in the Last Year: Never true  Transportation Needs: No Transportation Needs  . Lack of Transportation (Medical): No  . Lack of Transportation (Non-Medical): No  Physical Activity: Insufficiently Active  . Days of Exercise per Week: 2 days  . Minutes of Exercise per  Session: 20 min  Stress: No Stress Concern Present  . Feeling of Stress : Not at all  Social Connections: Moderately Integrated  . Frequency of Communication with Friends and Family: More than three times a week  . Frequency of Social Gatherings with Friends and Family: Twice a week  . Attends Religious Services: More than 4 times per year  . Active Member of Clubs or Organizations: No  . Attends Archivist Meetings: Never  . Marital Status: Married  Human resources officer Violence: Not At Risk  . Fear of Current or Ex-Partner: No  . Emotionally Abused: No  . Physically Abused: No  . Sexually Abused: No     Current Outpatient Medications:  .  acetaminophen (TYLENOL) 500 MG tablet, Take 500 mg by mouth every 6 (six) hours as needed., Disp: , Rfl:  .  albuterol (VENTOLIN HFA) 108 (90 Base) MCG/ACT inhaler, USE 2 INHALATIONS BY MOUTH  EVERY 6 HOURS AS NEEDED FOR WHEEZING OR SHORTNESS OF  BREATH, Disp: 18 g, Rfl: 1 .  aspirin EC 81 MG tablet, Take 81 mg by mouth daily. , Disp: , Rfl:  .  atorvastatin (LIPITOR) 10 MG tablet, TAKE 1 TABLET BY MOUTH  DAILY, Disp: 90 tablet, Rfl: 0 .  diclofenac Sodium (VOLTAREN) 1 % GEL, Apply 4 g topically 4 (four) times  daily., Disp: 300 g, Rfl: 0 .  furosemide (LASIX) 20 MG tablet, Take 20 mg by mouth daily., Disp: , Rfl:  .  JARDIANCE 10 MG TABS tablet, Take 10 mg by mouth daily., Disp: , Rfl:  .  Loratadine 10 MG CAPS, Take 10 mg by mouth daily. , Disp: , Rfl:  .  montelukast (SINGULAIR) 10 MG tablet, TAKE 1 TABLET BY MOUTH AT  BEDTIME, Disp: 90 tablet, Rfl: 0 .  pantoprazole (PROTONIX) 40 MG tablet, TAKE 1 TABLET BY MOUTH  DAILY, Disp: 90 tablet, Rfl: 0 .  Probiotic Product (PROBIOTIC PO), Take by mouth daily. , Disp: , Rfl:  .  tamsulosin (FLOMAX) 0.4 MG CAPS capsule, Take 1 capsule (0.4 mg total) by mouth daily., Disp: 90 capsule, Rfl: 3 .  doxycycline (MONODOX) 50 MG capsule, Take 1 capsule (50 mg total) by mouth daily. Take with food and drink  (Patient not taking: Reported on 12/30/2020), Disp: 90 capsule, Rfl: 2 .  gabapentin (NEURONTIN) 400 MG capsule, TAKE 1 CAPSULE THREE TIMES DAILY (Patient not taking: Reported on 12/30/2020), Disp: 180 capsule, Rfl: 4  Allergies  Allergen Reactions  . Gramineae Pollens Itching and Shortness Of Breath    Sneezing  . Cat Hair Extract Itching    Watery eyes and sneezing  . Breo Ellipta [Fluticasone Furoate-Vilanterol] Rash    Patient stated that it made his mouth break out  . Rosuvastatin Rash and Other (See Comments)    No reaction per pt, He saw some information regarding Crestor and adverse effects      ROS  Constitutional: Negative for fever , positive for  weight change.  Respiratory: Negative for cough and shortness of breath.   Cardiovascular: Negative for chest pain or palpitations.  Gastrointestinal: Negative for abdominal pain, no bowel changes.  Musculoskeletal: Negative for gait problem or joint swelling.  Skin: Negative for rash.  Neurological: Negative for dizziness or headache.  No other specific complaints in a complete review of systems (except as listed in HPI above).  Objective  Vitals:   12/30/20 1015  BP: 118/72  Pulse: 76  Resp: 16  Temp: 97.8 F (36.6 C)  TempSrc: Oral  SpO2: 98%  Weight: 175 lb (79.4 kg)  Height: 5\' 10"  (1.778 m)    Body mass index is 25.11 kg/m.  Physical Exam  Constitutional: Patient appears well-developed and malnourished ( some temporal waisting)  No distress.  HENT: Head: Normocephalic and atraumatic. Ears: B TMs ok, no erythema or effusion; Nose: Not done  Mouth/Throat: not done  Eyes: Conjunctivae and EOM are normal. Pupils are equal, round, and reactive to light. No scleral icterus.  Neck: Normal range of motion. Neck supple. No JVD present. No thyromegaly present.  Cardiovascular: Normal rate, regular rhythm and normal heart sounds.  No murmur heard. Trace  BLE edema. Pulmonary/Chest: Effort normal and breath sounds  normal. No respiratory distress. Abdominal: Soft. Bowel sounds are normal, no distension. There is no tenderness. no masses MALE GENITALIA: not done  RECTAL: Prostate normal size and consistency, no rectal masses or hemorrhoids Musculoskeletal: Normal range of motion, crepitus with extension of both knees, deformities on MCP thumbs  Neurological: he is alert and oriented to person, place, and time. No cranial nerve deficit. Coordination, balance, strength, speech and gait are normal.  Skin: Senile purpura  Psychiatric: Patient has a normal mood and affect. behavior is normal. Judgment and thought content normal.  Fall Risk: Fall Risk  12/30/2020 11/17/2020 07/20/2020 06/28/2020 12/25/2019  Falls in the  past year? 1 0 0 0 1  Number falls in past yr: 1 0 0 0 0  Injury with Fall? 1 0 0 0 0  Risk for fall due to : - No Fall Risks - - -  Follow up - Falls prevention discussed - - -     Functional Status Survey: Is the patient deaf or have difficulty hearing?: No Does the patient have difficulty seeing, even when wearing glasses/contacts?: Yes Does the patient have difficulty concentrating, remembering, or making decisions?: No Does the patient have difficulty walking or climbing stairs?: Yes Does the patient have difficulty dressing or bathing?: No Does the patient have difficulty doing errands alone such as visiting a doctor's office or shopping?: No   Assessment & Plan  1. Senile purpura (HCC)  Stable   2. Fibrosis of lung ([place])   3. Tortuous aorta (HCC)  - Lipid panel - atorvastatin (LIPITOR) 10 MG tablet; Take 1 tablet (10 mg total) by mouth daily.  Dispense: 90 tablet; Refill: 1  4. Peripheral polyneuropathy   5. CHF (congestive heart failure), NYHA class III, chronic, systolic (HCC)  - JARDIANCE 10 MG TABS tablet; Take 1 tablet (10 mg total) by mouth daily.  Dispense: 90 tablet; Refill: 1 - furosemide (LASIX) 20 MG tablet; Take 1 tablet (20 mg total) by mouth daily.   Dispense: 90 tablet; Refill: 1 - potassium chloride SA (KLOR-CON) 20 MEQ tablet; Take 1 tablet (20 mEq total) by mouth daily.  Dispense: 90 tablet; Refill: 1 - COMPLETE METABOLIC PANEL WITH GFR  6. BPH with obstruction/lower urinary tract symptoms   7. Essential hypertension   8. Thoracic aortic aneurysm without rupture ([place])  - Ambulatory referral to Vascular Surgery  9. Hyperglycemia  - Hemoglobin A1c  10. Well adult exam   11. Pure hypercholesterolemia  - atorvastatin (LIPITOR) 10 MG tablet; Take 1 tablet (10 mg total) by mouth daily.  Dispense: 90 tablet; Refill: 1  12. Atherosclerosis of aorta (HCC)  - atorvastatin (LIPITOR) 10 MG tablet; Take 1 tablet (10 mg total) by mouth daily.  Dispense: 90 tablet; Refill: 1  13. Perennial allergic rhinitis  - montelukast (SINGULAIR) 10 MG tablet; Take 1 tablet (10 mg total) by mouth at bedtime.  Dispense: 90 tablet; Refill: 1  14. Gastroesophageal reflux disease without esophagitis  - pantoprazole (PROTONIX) 40 MG tablet; Take 1 tablet (40 mg total) by mouth daily.  Dispense: 90 tablet; Refill: 0  15. Primary osteoarthritis of both knees  - diclofenac Sodium (VOLTAREN) 1 % GEL; Apply 4 g topically 4 (four) times daily.  Dispense: 300 g; Refill: 1  16. Mild protein-calorie malnutrition (HCC)  Premier protein shake   -Prostate cancer screening and PSA options (with potential risks and benefits of testing vs not testing) were discussed along with recent recs/guidelines. -USPSTF grade A and B recommendations reviewed with patient; age-appropriate recommendations, preventive care, screening tests, etc discussed and encouraged; healthy living encouraged; see AVS for patient education given to patient -Discussed importance of 150 minutes of physical activity weekly, eat two servings of fish weekly, eat one serving of tree nuts ( cashews, pistachios, pecans, almonds.Marland Kitchen) every other day, eat 6 servings of fruit/vegetables daily and  drink plenty of water and avoid sweet beverages.

## 2020-12-30 NOTE — Patient Instructions (Addendum)
Check local pharmacy for shingrix vaccine   Preventive Care 85 Years and Older, Male Preventive care refers to lifestyle choices and visits with your health care provider that can promote health and wellness. This includes:  A yearly physical exam. This is also called an annual wellness visit.  Regular dental and eye exams.  Immunizations.  Screening for certain conditions.  Healthy lifestyle choices, such as: ? Eating a healthy diet. ? Getting regular exercise. ? Not using drugs or products that contain nicotine and tobacco. ? Limiting alcohol use. What can I expect for my preventive care visit? Physical exam Your health care provider will check your:  Height and weight. These may be used to calculate your BMI (body mass index). BMI is a measurement that tells if you are at a healthy weight.  Heart rate and blood pressure.  Body temperature.  Skin for abnormal spots. Counseling Your health care provider may ask you questions about your:  Past medical problems.  Family's medical history.  Alcohol, tobacco, and drug use.  Emotional well-being.  Home life and relationship well-being.  Sexual activity.  Diet, exercise, and sleep habits.  History of falls.  Memory and ability to understand (cognition).  Work and work Statistician.  Access to firearms. What immunizations do I need? Vaccines are usually given at various ages, according to a schedule. Your health care provider will recommend vaccines for you based on your age, medical history, and lifestyle or other factors, such as travel or where you work.   What tests do I need? Blood tests  Lipid and cholesterol levels. These may be checked every 5 years, or more often depending on your overall health.  Hepatitis C test.  Hepatitis B test. Screening  Lung cancer screening. You may have this screening every year starting at age 85 if you have a 30-pack-year history of smoking and currently smoke or have  quit within the past 15 years.  Colorectal cancer screening. ? All adults should have this screening starting at age 85 and continuing until age 85. ? Your health care provider may recommend screening at age 85 if you are at increased risk. ? You will have tests every 1-10 years, depending on your results and the type of screening test.  Prostate cancer screening. Recommendations will vary depending on your family history and other risks.  Genital exam to check for testicular cancer or hernias.  Diabetes screening. ? This is done by checking your blood sugar (glucose) after you have not eaten for a while (fasting). ? You may have this done every 1-3 years.  Abdominal aortic aneurysm (AAA) screening. You may need this if you are a current or former smoker.  STD (sexually transmitted disease) testing, if you are at risk. Follow these instructions at home: Eating and drinking  Eat a diet that includes fresh fruits and vegetables, whole grains, lean protein, and low-fat dairy products. Limit your intake of foods with high amounts of sugar, saturated fats, and salt.  Take vitamin and mineral supplements as recommended by your health care provider.  Do not drink alcohol if your health care provider tells you not to drink.  If you drink alcohol: ? Limit how much you have to 0-2 drinks a day. ? Be aware of how much alcohol is in your drink. In the [place] one drink equals one 12 oz bottle of beer (355 mL), one 5 oz glass of wine (148 mL), or one 1 oz glass of hard liquor (44 mL).  Lifestyle  Take daily care of your teeth and gums. Brush your teeth every morning and night with fluoride toothpaste. Floss one time each day.  Stay active. Exercise for at least 30 minutes 5 or more days each week.  Do not use any products that contain nicotine or tobacco, such as cigarettes, e-cigarettes, and chewing tobacco. If you need help quitting, ask your health care provider.  Do not use drugs.  If  you are sexually active, practice safe sex. Use a condom or other form of protection to prevent STIs (sexually transmitted infections).  Talk with your health care provider about taking a low-dose aspirin or statin.  Find healthy ways to cope with stress, such as: ? Meditation, yoga, or listening to music. ? Journaling. ? Talking to a trusted person. ? Spending time with friends and family. Safety  Always wear your seat belt while driving or riding in a vehicle.  Do not drive: ? If you have been drinking alcohol. Do not ride with someone who has been drinking. ? When you are tired or distracted. ? While texting.  Wear a helmet and other protective equipment during sports activities.  If you have firearms in your house, make sure you follow all gun safety procedures. What's next?  Visit your health care provider once a year for an annual wellness visit.  Ask your health care provider how often you should have your eyes and teeth checked.  Stay up to date on all vaccines. This information is not intended to replace advice given to you by your health care provider. Make sure you discuss any questions you have with your health care provider. Document Revised: 04/14/2019 Document Reviewed: 07/10/2018 Elsevier Patient Education  2021 [employer].

## 2020-12-31 LAB — HEMOGLOBIN A1C
Hgb A1c MFr Bld: 5.6 % of total Hgb (ref <5.7)
Mean Plasma Glucose: 114 mg/dL
eAG (mmol/L): 6.3 mmol/L

## 2020-12-31 LAB — TSH: TSH: 0.5 mIU/L (ref 0.40–4.50)

## 2020-12-31 LAB — LIPID PANEL
Cholesterol: 142 mg/dL (ref <200)
HDL: 48 mg/dL (ref >=40)
LDL Cholesterol (Calc): 79 mg/dL (calc)
Non-HDL Cholesterol (Calc): 94 mg/dL (calc) (ref <130)
Total CHOL/HDL Ratio: 3 (calc) (ref <5.0)
Triglycerides: 71 mg/dL (ref <150)

## 2020-12-31 LAB — COMPLETE METABOLIC PANEL WITH GFR
AG Ratio: 1.8 (calc) (ref 1.0–2.5)
ALT: 14 U/L (ref 9–46)
AST: 16 U/L (ref 10–35)
Albumin: 4.6 g/dL (ref 3.6–5.1)
Alkaline phosphatase (APISO): 60 U/L (ref 35–144)
BUN: 18 mg/dL (ref 7–25)
CO2: 26 mmol/L (ref 20–32)
Calcium: 10 mg/dL (ref 8.6–10.3)
Chloride: 101 mmol/L (ref 98–110)
Creat: 0.96 mg/dL (ref 0.70–1.11)
GFR, Est African American: 80 mL/min/{1.73_m2} (ref >=60)
GFR, Est Non African American: 69 mL/min/{1.73_m2} (ref >=60)
Globulin: 2.6 g/dL (calc) (ref 1.9–3.7)
Glucose, Bld: 122 mg/dL — ABNORMAL HIGH (ref 65–99)
Potassium: 5.5 mmol/L — ABNORMAL HIGH (ref 3.5–5.3)
Sodium: 137 mmol/L (ref 135–146)
Total Bilirubin: 0.8 mg/dL (ref 0.2–1.2)
Total Protein: 7.2 g/dL (ref 6.1–8.1)

## 2021-01-12 DIAGNOSIS — H509 Unspecified strabismus: Secondary | ICD-10-CM | POA: Diagnosis not present

## 2021-01-20 ENCOUNTER — Other Ambulatory Visit: Payer: Self-pay | Admitting: Family Medicine

## 2021-02-02 ENCOUNTER — Other Ambulatory Visit: Payer: Self-pay | Admitting: Family Medicine

## 2021-02-02 ENCOUNTER — Other Ambulatory Visit (INDEPENDENT_AMBULATORY_CARE_PROVIDER_SITE_OTHER): Payer: Self-pay | Admitting: Vascular Surgery

## 2021-02-02 DIAGNOSIS — M170 Bilateral primary osteoarthritis of knee: Secondary | ICD-10-CM

## 2021-02-02 DIAGNOSIS — I6523 Occlusion and stenosis of bilateral carotid arteries: Secondary | ICD-10-CM

## 2021-02-06 ENCOUNTER — Other Ambulatory Visit: Payer: Self-pay

## 2021-02-06 ENCOUNTER — Ambulatory Visit (INDEPENDENT_AMBULATORY_CARE_PROVIDER_SITE_OTHER): Payer: Medicare Other

## 2021-02-06 ENCOUNTER — Encounter (INDEPENDENT_AMBULATORY_CARE_PROVIDER_SITE_OTHER): Payer: Self-pay | Admitting: Vascular Surgery

## 2021-02-06 ENCOUNTER — Ambulatory Visit (INDEPENDENT_AMBULATORY_CARE_PROVIDER_SITE_OTHER): Payer: Medicare Other | Admitting: Vascular Surgery

## 2021-02-06 VITALS — Resp 16 | Wt 177.8 lb

## 2021-02-06 DIAGNOSIS — I6523 Occlusion and stenosis of bilateral carotid arteries: Secondary | ICD-10-CM | POA: Diagnosis not present

## 2021-02-06 DIAGNOSIS — K219 Gastro-esophageal reflux disease without esophagitis: Secondary | ICD-10-CM | POA: Diagnosis not present

## 2021-02-06 DIAGNOSIS — E7800 Pure hypercholesterolemia, unspecified: Secondary | ICD-10-CM

## 2021-02-06 DIAGNOSIS — I2510 Atherosclerotic heart disease of native coronary artery without angina pectoris: Secondary | ICD-10-CM | POA: Diagnosis not present

## 2021-02-06 DIAGNOSIS — I10 Essential (primary) hypertension: Secondary | ICD-10-CM | POA: Diagnosis not present

## 2021-02-06 NOTE — Progress Notes (Signed)
MRN : [id number]  Mark [name] is a 87 y.o. (1929/06/08) male who presents with chief complaint of No chief complaint on file. Marland Kitchen  History of Present Illness:   The patient is seen for follow up evaluation of carotid stenosis. The carotid stenosis followed by ultrasound.   The patient denies amaurosis fugax. There is no recent history of TIA symptoms or focal motor deficits. There is no prior documented CVA.   The patient is taking enteric-coated aspirin 81 mg daily.   There is no history of migraine headaches. There is no history of seizures.   The patient has a history of coronary artery disease, no recent episodes of angina or shortness of breath. The patient denies PAD or claudication symptoms. There is a history of hyperlipidemia which is being treated with a statin.    Duplex ultrasound of the carotid arteries obtained 02/06/2021 demonstrates 1-39% bilateral ICA stenosis (string sign noted in the right ECA).    No outpatient medications have been marked as taking for the 02/06/21 encounter (Appointment) with Delana Meyer, Dolores Lory, MD.    Past Medical History:  Diagnosis Date   Actinic keratosis 11/20/2018   right proximal lateral bicep.   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 Deep margin involved. 2nd Excision 12/23/2018, no residual lesion, margins free   Carotid artery disease ([place])    Coronary artery disease 2005   COVID-19 09/01/2019   symptoms resolved 09/05/19   GERD (gastroesophageal reflux disease)    Hyperkalemia    Hyperlipidemia    Hypertension    Past heart attack 2005   Squamous cell carcinoma of skin 08/21/2018   Left distal dorsum forearm. WD SCC. [facility]    Past Surgical History:  Procedure Laterality Date   CATARACT EXTRACTION W/PHACO Right 11/24/2019   Procedure: CATARACT EXTRACTION PHACO AND INTRAOCULAR LENS PLACEMENT (IOC) RIGHT MALYUGIN 8.59 00:50.8;  Surgeon: Birder Robson, MD;  Location: [place];  Service: Ophthalmology;  Laterality: Right;  COVID ( + ) 09-01-19   CATARACT EXTRACTION W/PHACO Left 12/15/2019   Procedure: CATARACT EXTRACTION PHACO AND INTRAOCULAR LENS PLACEMENT (IOC) LEFT 9.90  01:03.7;  Surgeon: Birder Robson, MD;  Location: [place];  Service: Ophthalmology;  Laterality: Left;   CORONARY ANGIOPLASTY WITH STENT PLACEMENT  2005   [facility]    Social History Social History   Tobacco Use   Smoking status: Former    Pack years: 0.00    Types: Cigarettes    Quit date: 05/16/1963    Years since quitting: 57.7   Smokeless tobacco: Never   Tobacco comments:    quit over 27 years ago  Vaping Use   Vaping Use: Never used  Substance Use Topics   Alcohol use: No   Drug use: No    Family History Family History  Problem Relation Age of Onset   Hypertension Mother    Heart disease Mother    Heart disease Father    Hypertension Father   No family history of bleeding/clotting disorders, porphyria or autoimmune disease   Allergies  Allergen Reactions   Gramineae Pollens Itching and Shortness Of Breath    Sneezing   Cat Hair Extract Itching    Watery eyes and sneezing   Breo Ellipta [Fluticasone Furoate-Vilanterol] Rash    Patient stated that it made his mouth break out   Rosuvastatin Rash and Other (See Comments)    No reaction per pt, He saw some information regarding  Crestor and adverse effects      REVIEW OF SYSTEMS (Negative unless checked)  Constitutional: [] Weight loss  [] Fever  [] Chills Cardiac: [] Chest pain   [] Chest pressure   [] Palpitations   [] Shortness of breath when laying flat   [] Shortness of breath with exertion. Vascular:  [] Pain in legs with walking   [] Pain in legs at rest  [] History of DVT   [] Phlebitis   [] Swelling in legs   [] Varicose veins   [] Non-healing ulcers Pulmonary:   [] Uses home oxygen   [] Productive cough   [] Hemoptysis   [] Wheeze  [] COPD   [] Asthma Neurologic:  [] Dizziness   [] Seizures   [] History of  stroke   [] History of TIA  [] Aphasia   [] Vissual changes   [] Weakness or numbness in arm   [] Weakness or numbness in leg Musculoskeletal:   [] Joint swelling   [x] Joint pain   [] Low back pain Hematologic:  [] Easy bruising  [] Easy bleeding   [] Hypercoagulable state   [] Anemic Gastrointestinal:  [] Diarrhea   [] Vomiting  [x] Gastroesophageal reflux/heartburn   [] Difficulty swallowing. Genitourinary:  [] Chronic kidney disease   [] Difficult urination  [] Frequent urination   [] Blood in urine Skin:  [] Rashes   [] Ulcers  Psychological:  [] History of anxiety   []  History of major depression.  Physical Examination  There were no vitals filed for this visit. There is no height or weight on file to calculate BMI. Gen: WD/WN, NAD Head: [place]/AT, No temporalis wasting.  Ear/Nose/Throat: Hearing grossly intact, nares w/o erythema or drainage, poor dentition Eyes: PER, EOMI, sclera nonicteric.  Neck: Supple, no masses.  No bruit or JVD.  Pulmonary:  Good air movement, clear to auscultation bilaterally, no use of accessory muscles.  Cardiac: RRR, normal S1, S2, no Murmurs. Vascular:  right carotid bruit Vessel Right Left  Radial Palpable Palpable  Carotid Palpable Palpable  Gastrointestinal: soft, non-distended. No guarding/no peritoneal signs.  Musculoskeletal: M/S 5/5 throughout.  No deformity or atrophy.  Neurologic: CN 2-12 intact. Pain and light touch intact in extremities.  Symmetrical.  Speech is fluent. Motor exam as listed above. Psychiatric: Judgment intact, Mood & affect appropriate for pt's clinical situation. Dermatologic: No rashes or ulcers noted.  No changes consistent with cellulitis. Lymph : No Cervical lymphadenopathy, no lichenification or skin changes of chronic lymphedema.  CBC Lab Results  Component Value Date   WBC 7.7 12/25/2019   HGB 13.8 12/25/2019   HCT 42.4 12/25/2019   MCV 97.0 12/25/2019   PLT 222 12/25/2019    BMET    Component Value Date/Time   NA 137 12/30/2020  1122   NA 140 08/05/2015 1233   K 5.5 (H) 12/30/2020 1122   CL 101 12/30/2020 1122   CO2 26 12/30/2020 1122   GLUCOSE 122 (H) 12/30/2020 1122   BUN 18 12/30/2020 1122   BUN 15 08/05/2015 1233   CREATININE 0.96 12/30/2020 1122   CALCIUM 10.0 12/30/2020 1122   GFRNONAA 69 12/30/2020 1122   GFRAA 80 12/30/2020 1122   CrCl cannot be calculated (Patient's most recent lab result is older than the maximum 21 days allowed.).  COAG No results found for: INR, PROTIME  Radiology No results found.   Assessment/Plan 1. Bilateral carotid artery stenosis Recommend:  Given the patient's asymptomatic subcritical stenosis no further invasive testing or surgery at this time.  Duplex ultrasound of the carotid arteries obtained 02/06/2021 demonstrates 1-39% bilateral ICA stenosis (string sign noted in the right ECA).  Continue antiplatelet therapy as prescribed Continue management of CAD, HTN and Hyperlipidemia  Healthy heart diet,  encouraged exercise at least 4 times per week.  Follow up in 12 months with duplex ultrasound and physical exam   - VAS US CAROTID; Future  2. Benign essential HTN Continue antihypertensive medications as already ordered, these medications have been reviewed and there are no changes at this time.   3. Arteriosclerosis of coronary artery Continue cardiac and antihypertensive medications as already ordered and reviewed, no changes at this time.  Continue statin as ordered and reviewed, no changes at this time  Nitrates PRN for chest pain   4. Pure hypercholesterolemia Continue statin as ordered and reviewed, no changes at this time   5. Gastroesophageal reflux disease, unspecified whether esophagitis present Continue PPI as already ordered, this medication has been reviewed and there are no changes at this time.  Avoidence of caffeine and alcohol  Moderate elevation of the head of the bed      Hortencia Pilar, MD  02/06/2021 12:48 PM

## 2021-02-13 ENCOUNTER — Encounter: Payer: Self-pay | Admitting: Family Medicine

## 2021-02-13 ENCOUNTER — Other Ambulatory Visit: Payer: Self-pay

## 2021-02-13 ENCOUNTER — Ambulatory Visit (INDEPENDENT_AMBULATORY_CARE_PROVIDER_SITE_OTHER): Payer: Medicare Other | Admitting: Family Medicine

## 2021-02-13 VITALS — BP 124/80 | HR 82 | Temp 97.7°F | Resp 18 | Ht 67.0 in | Wt 182.0 lb

## 2021-02-13 DIAGNOSIS — R0600 Dyspnea, unspecified: Secondary | ICD-10-CM

## 2021-02-13 MED ORDER — PREDNISONE 20 MG PO TABS: 40.0000 mg | ORAL_TABLET | Freq: Every day | ORAL | 0 refills | Status: AC

## 2021-02-13 MED ORDER — BUDESONIDE-FORMOTEROL FUMARATE 80-4.5 MCG/ACT IN AERO: 2.0000 | INHALATION_SPRAY | Freq: Two times a day (BID) | RESPIRATORY_TRACT | 3 refills | Status: DC

## 2021-02-13 NOTE — Progress Notes (Signed)
Patient ID: FLEMING PRILL, male    DOB: [birth year], 87 y.o.   MRN: [id number]  PCP: Steele Sizer, MD  Chief Complaint  Patient presents with   Allergic Rhinitis     P[ossibly to son's dog who's visiting.  SOB, wheezing and some cough    Subjective:   KADEEN SROKA is a 87 y.o. male, presents to clinic with CC of the following:  HPI  He presets with URI sx and reported SOB and wheeze with asthma hx (?)  His son has been visiting him and brought his dog he has some reactive airway and dog allergies it has gradually gotten worse over the past week or 2, he is using rescue inhaler more frequently and it helps for a short amount of time.  This did happen last year when his son and dog visited.  He has not tried any allergy medications Also has hx of CHF and was recently hospitalized for CHF exacerbation   Up 5 lbs from last weight documented a week ago-patient endorses no weight change at home, he has some lower extremity edema today but does not usually have it he denies orthopnea, palpitations and is having dyspnea on exertion which she states is due to wheeze Wt Readings from Last 5 Encounters:  02/13/21 182 lb (82.6 kg)  02/06/21 177 lb 12.8 oz (80.6 kg)  12/30/20 175 lb (79.4 kg)  12/01/20 190 lb 0.6 oz (86.2 kg)  06/28/20 190 lb (86.2 kg)   BMI Readings from Last 5 Encounters:  02/13/21 28.51 kg/m  02/06/21 25.51 kg/m  12/30/20 25.11 kg/m  12/01/20 26.50 kg/m  06/28/20 26.50 kg/m  He further notes that he does not feel like he is having a CHF exacerbation like he had in May when he went to the hospital   Patient Active Problem List   Diagnosis Date Noted   HFrEF (heart failure with reduced ejection fraction) ([place]) 12/02/2020   Cardiac syncope 09/22/2019   Respiratory tract infection due to COVID-19 virus 09/04/2019   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/29/2018   Senile purpura ([place]) 09/29/2018   Atherosclerosis of aorta ([place]) 09/29/2018   Hardening of the aorta (main  artery of the heart) ([place]) 09/29/2018   Abnormal ECG 06/16/2018   Thoracic aortic aneurysm without rupture ([place]) 05/28/2018   CHF (congestive heart failure), NYHA class III, chronic, systolic ([place]) 05/23/8526   BPH with obstruction/lower urinary tract symptoms 02/26/2018   Ischemic cardiomyopathy 06/11/2017   Benign essential HTN 05/22/2017   Bilateral carotid artery stenosis 05/22/2017   Neuropathy 01/28/2017   Right leg paresthesias 11/26/2016   Hypertension 08/27/2016   GERD (gastroesophageal reflux disease) 08/27/2016   Hyperglycemia 05/10/2016   History of nonmelanoma skin cancer 10/10/2015   Pain in right shoulder 09/13/2015   Osteoarthritis 07/06/2015   Lymphedema 05/30/2015   Other specified soft tissue disorders 05/30/2015   Hyperlipidemia 03/29/2015   Family history of type 2 diabetes mellitus in father 03/29/2015   DD (diverticular disease) 02/03/2015   Diverticulosis of intestine without perforation or abscess without bleeding 02/03/2015   DDD (degenerative disc disease), lumbar 03/03/2014   Intervertebral disc disorder with radiculopathy of lumbar region 03/03/2014   Lumbar radiculitis 03/03/2014   Lumbar stenosis with neurogenic claudication 03/03/2014   Arteriosclerosis of coronary artery 04/18/2012   S/P coronary artery stent placement 04/18/2012   Status post percutaneous transluminal coronary angioplasty 04/18/2012      Current Outpatient Medications:    acetaminophen (TYLENOL) 500 MG tablet, Take 500  mg by mouth every 6 (six) hours as needed., Disp: , Rfl:    albuterol (VENTOLIN HFA) 108 (90 Base) MCG/ACT inhaler, USE 2 INHALATIONS BY MOUTH  EVERY 6 HOURS AS NEEDED FOR WHEEZING OR SHORTNESS OF  BREATH, Disp: 18 g, Rfl: 1   aspirin EC 81 MG tablet, Take 81 mg by mouth daily. , Disp: , Rfl:    atorvastatin (LIPITOR) 10 MG tablet, Take 1 tablet (10 mg total) by mouth daily., Disp: 90 tablet, Rfl: 1   diclofenac Sodium (VOLTAREN) 1 % GEL, APPLY 4 GRAMS TOPICALLY 4   TIMES DAILY, Disp: 600 g, Rfl: 0   furosemide (LASIX) 20 MG tablet, Take 1 tablet (20 mg total) by mouth daily., Disp: 90 tablet, Rfl: 1   gabapentin (NEURONTIN) 100 MG capsule, TAKE 1 TO 3 CAPSULES BY  MOUTH 3 TIMES DAILY, Disp: 90 capsule, Rfl: 0   JARDIANCE 10 MG TABS tablet, Take 1 tablet (10 mg total) by mouth daily., Disp: 90 tablet, Rfl: 1   Loratadine 10 MG CAPS, Take 10 mg by mouth daily. , Disp: , Rfl:    montelukast (SINGULAIR) 10 MG tablet, Take 1 tablet (10 mg total) by mouth at bedtime., Disp: 90 tablet, Rfl: 1   pantoprazole (PROTONIX) 40 MG tablet, Take 1 tablet (40 mg total) by mouth daily., Disp: 90 tablet, Rfl: 0   potassium chloride SA (KLOR-CON) 20 MEQ tablet, Take 1 tablet (20 mEq total) by mouth daily., Disp: 90 tablet, Rfl: 1   Probiotic Product (PROBIOTIC PO), Take by mouth daily. , Disp: , Rfl:    tamsulosin (FLOMAX) 0.4 MG CAPS capsule, Take 1 capsule (0.4 mg total) by mouth daily., Disp: 90 capsule, Rfl: 3   Allergies  Allergen Reactions   Gramineae Pollens Itching and Shortness Of Breath    Sneezing   Cat Hair Extract Itching    Watery eyes and sneezing   Breo Ellipta [Fluticasone Furoate-Vilanterol] Rash    Patient stated that it made his mouth break out   Rosuvastatin Rash and Other (See Comments)    No reaction per pt, He saw some information regarding Crestor and adverse effects      Social History   Tobacco Use   Smoking status: Former    Types: Cigarettes    Quit date: 05/16/1963    Years since quitting: 57.7   Smokeless tobacco: Never   Tobacco comments:    quit over 50 years ago  Vaping Use   Vaping Use: Never used  Substance Use Topics   Alcohol use: No   Drug use: No      Chart Review Today: I personally reviewed active problem list, medication list, allergies, family history, social history, health maintenance, notes from last encounter, lab results, imaging with the patient/caregiver today.   Review of Systems  Constitutional:  Negative.  Negative for chills, diaphoresis, fatigue, fever and unexpected weight change.  HENT: Negative.    Eyes: Negative.   Respiratory: Negative.    Cardiovascular: Negative.   Gastrointestinal: Negative.   Endocrine: Negative.   Genitourinary: Negative.   Musculoskeletal: Negative.   Skin: Negative.   Allergic/Immunologic: Negative.   Neurological: Negative.   Hematological: Negative.   Psychiatric/Behavioral: Negative.    All other systems reviewed and are negative.     Objective:   Vitals:   02/13/21 1424  BP: 124/80  Pulse: 82  Resp: 18  Temp: 97.7 F (36.5 C)  SpO2: 97%  Weight: 182 lb (82.6 kg)  Height: 5\' 7"  (1.702 m)  Body mass index is 28.51 kg/m.  Physical Exam Vitals and nursing note reviewed.  Constitutional:      General: He is not in acute distress.    Appearance: Normal appearance. He is obese. He is not ill-appearing, toxic-appearing or diaphoretic.  HENT:     Head: Normocephalic and atraumatic.     Right Ear: External ear normal.     Left Ear: External ear normal.     Nose: Congestion and rhinorrhea present.     Mouth/Throat:     Mouth: Mucous membranes are moist.     Pharynx: Oropharynx is clear. No oropharyngeal exudate.  Eyes:     General:        Right eye: No discharge.        Left eye: No discharge.     Conjunctiva/sclera: Conjunctivae normal.  Cardiovascular:     Rate and Rhythm: Normal rate and regular rhythm.     Pulses: Normal pulses.     Heart sounds: Normal heart sounds. No murmur heard.   No friction rub. No gallop.  Pulmonary:     Effort: Pulmonary effort is normal. No respiratory distress.     Breath sounds: Normal breath sounds. No stridor. No wheezing, rhonchi or rales.  Abdominal:     General: Bowel sounds are normal.     Palpations: Abdomen is soft.  Musculoskeletal:     Right lower leg: Edema present.     Left lower leg: Edema present.  Skin:    Capillary Refill: Capillary refill takes less than 2 seconds.      Coloration: Skin is not jaundiced or pale.     Findings: No lesion or rash.  Neurological:     Mental Status: He is alert. Mental status is at baseline.  Psychiatric:        Mood and Affect: Mood normal.     Results for orders placed or performed in visit on 12/30/20  Lipid panel  Result Value Ref Range   Cholesterol 142 <200 mg/dL   HDL 48 > OR = 40 mg/dL   Triglycerides 71 <150 mg/dL   LDL Cholesterol (Calc) 79 mg/dL (calc)   Total CHOL/HDL Ratio 3.0 <5.0 (calc)   Non-HDL Cholesterol (Calc) 94 <130 mg/dL (calc)  Hemoglobin A1c  Result Value Ref Range   Hgb A1c MFr Bld 5.6 <5.7 % of total Hgb   Mean Plasma Glucose 114 mg/dL   eAG (mmol/L) 6.3 mmol/L  COMPLETE METABOLIC PANEL WITH GFR  Result Value Ref Range   Glucose, Bld 122 (H) 65 - 99 mg/dL   BUN 18 7 - 25 mg/dL   Creat 0.96 0.70 - 1.11 mg/dL   GFR, Est Non African American 69 > OR = 60 mL/min/1.15m2   GFR, Est African American 80 > OR = 60 mL/min/1.41m2   BUN/Creatinine Ratio NOT APPLICABLE 6 - 22 (calc)   Sodium 137 135 - 146 mmol/L   Potassium 5.5 (H) 3.5 - 5.3 mmol/L   Chloride 101 98 - 110 mmol/L   CO2 26 20 - 32 mmol/L   Calcium 10.0 8.6 - 10.3 mg/dL   Total Protein 7.2 6.1 - 8.1 g/dL   Albumin 4.6 3.6 - 5.1 g/dL   Globulin 2.6 1.9 - 3.7 g/dL (calc)   AG Ratio 1.8 1.0 - 2.5 (calc)   Total Bilirubin 0.8 0.2 - 1.2 mg/dL   Alkaline phosphatase (APISO) 60 35 - 144 U/L   AST 16 10 - 35 U/L   ALT 14 9 - 46 U/L  TSH  Result Value Ref Range   TSH 0.50 0.40 - 4.50 mIU/L       Assessment & Plan:     ICD-10-CM   1. Dyspnea, unspecified type  R06.00 predniSONE (DELTASONE) 20 MG tablet    budesonide-formoterol (SYMBICORT) 80-4.5 MCG/ACT inhaler   asthma exacerbation vs CHF?  prednisone burst, start daily inhaler and allergy med with continued exposure to dog/asthma trigger     Pt was ambulated around clinic 3x on RA, SpO2 95-95%, no distress and no desat With concern with his recent hospitalization for fluid  overload and CHF his weight when trending vital signs does appear to be increased about 5 pounds and he does have some pitting edema but he denies orthopnea palpitations or chest pain and he does not feel similar to how he felt in May.  I did encourage him to follow-up with his cardiologist and see if he should monitor weights further or take increased dose of Lasix.  We will treat him for asthma exacerbation and with prolonged exposure to his son's dog he will likely need more than just a burst of steroids so we discussed starting a daily inhaler and allergy medicines encouraged him to try Allegra, may also consider sending in montelukast.    Close follow-up if not improving in the next week Overall pt was well appearing, NAD, non-toxic and was able to ambulate around clinic prior to leaving w/o any signs of increased work of breathing.   Delsa Grana, PA-C 02/13/21 2:33 PM

## 2021-02-13 NOTE — Patient Instructions (Signed)
Follow up with Dr. Nehemiah Massed tomorrow if you still have swelling in legs and shortness of breath.

## 2021-02-15 NOTE — Progress Notes (Signed)
Patient ID: Mark [name], male    DOB: 1929/06/27, 87 y.o.   MRN: [id number]  PCP: Steele Sizer, MD  Chief Complaint  Patient presents with   Allergic Rhinitis     P[ossibly to son's dog who's visiting.  SOB, wheezing and some cough    Subjective:   Mark [name] is a 87 y.o. male, presents to clinic with CC of the following:  HPI  He presets with URI sx and reported SOB and wheeze with asthma hx (?)  His son has been visiting him and brought his dog he has some reactive airway and dog allergies it has gradually gotten worse over the past week or 2, he is using rescue inhaler more frequently and it helps for a short amount of time.  This did happen last year when his son and dog visited.  He has not tried any allergy medications Also has hx of CHF and was recently hospitalized for CHF exacerbation   Up 5 lbs from last weight documented a week ago-patient endorses no weight change at home, he has some lower extremity edema today but does not usually have it he denies orthopnea, palpitations and is having dyspnea on exertion which she states is due to wheeze Wt Readings from Last 5 Encounters:  02/13/21 182 lb (82.6 kg)  02/06/21 177 lb 12.8 oz (80.6 kg)  12/30/20 175 lb (79.4 kg)  12/01/20 190 lb 0.6 oz (86.2 kg)  06/28/20 190 lb (86.2 kg)   BMI Readings from Last 5 Encounters:  02/13/21 28.51 kg/m  02/06/21 25.51 kg/m  12/30/20 25.11 kg/m  12/01/20 26.50 kg/m  06/28/20 26.50 kg/m  He further notes that he does not feel like he is having a CHF exacerbation like he had in May when he went to the hospital   Patient Active Problem List   Diagnosis Date Noted   HFrEF (heart failure with reduced ejection fraction) ([place]) 12/02/2020   Cardiac syncope 09/22/2019   Respiratory tract infection due to COVID-19 virus 09/04/2019   Fibrosis of lung ([place]) 09/29/2018   Senile purpura ([place]) 09/29/2018   Atherosclerosis of aorta ([place]) 09/29/2018   Hardening of the aorta (main  artery of the heart) ([place]) 09/29/2018   Abnormal ECG 06/16/2018   Thoracic aortic aneurysm without rupture ([place]) 05/28/2018   CHF (congestive heart failure), NYHA class III, chronic, systolic ([place]) 84/69/6295   BPH with obstruction/lower urinary tract symptoms 02/26/2018   Ischemic cardiomyopathy 06/11/2017   Benign essential HTN 05/22/2017   Bilateral carotid artery stenosis 05/22/2017   Neuropathy 01/28/2017   Right leg paresthesias 11/26/2016   Hypertension 08/27/2016   GERD (gastroesophageal reflux disease) 08/27/2016   Hyperglycemia 05/10/2016   History of nonmelanoma skin cancer 10/10/2015   Pain in right shoulder 09/13/2015   Osteoarthritis 07/06/2015   Lymphedema 05/30/2015   Other specified soft tissue disorders 05/30/2015   Hyperlipidemia 03/29/2015   Family history of type 2 diabetes mellitus in father 03/29/2015   DD (diverticular disease) 02/03/2015   Diverticulosis of intestine without perforation or abscess without bleeding 02/03/2015   DDD (degenerative disc disease), lumbar 03/03/2014   Intervertebral disc disorder with radiculopathy of lumbar region 03/03/2014   Lumbar radiculitis 03/03/2014   Lumbar stenosis with neurogenic claudication 03/03/2014   Arteriosclerosis of coronary artery 04/18/2012   S/P coronary artery stent placement 04/18/2012   Status post percutaneous transluminal coronary angioplasty 04/18/2012      Current Outpatient Medications:    acetaminophen (TYLENOL) 500 MG tablet, Take 500  mg by mouth every 6 (six) hours as needed., Disp: , Rfl:    albuterol (VENTOLIN HFA) 108 (90 Base) MCG/ACT inhaler, USE 2 INHALATIONS BY MOUTH  EVERY 6 HOURS AS NEEDED FOR WHEEZING OR SHORTNESS OF  BREATH, Disp: 18 g, Rfl: 1   aspirin EC 81 MG tablet, Take 81 mg by mouth daily. , Disp: , Rfl:    atorvastatin (LIPITOR) 10 MG tablet, Take 1 tablet (10 mg total) by mouth daily., Disp: 90 tablet, Rfl: 1   budesonide-formoterol (SYMBICORT) 80-4.5 MCG/ACT inhaler,  Inhale 2 puffs into the lungs 2 (two) times daily., Disp: 1 each, Rfl: 3   diclofenac Sodium (VOLTAREN) 1 % GEL, APPLY 4 GRAMS TOPICALLY 4  TIMES DAILY, Disp: 600 g, Rfl: 0   furosemide (LASIX) 20 MG tablet, Take 1 tablet (20 mg total) by mouth daily., Disp: 90 tablet, Rfl: 1   gabapentin (NEURONTIN) 100 MG capsule, TAKE 1 TO 3 CAPSULES BY  MOUTH 3 TIMES DAILY, Disp: 90 capsule, Rfl: 0   JARDIANCE 10 MG TABS tablet, Take 1 tablet (10 mg total) by mouth daily., Disp: 90 tablet, Rfl: 1   Loratadine 10 MG CAPS, Take 10 mg by mouth daily. , Disp: , Rfl:    montelukast (SINGULAIR) 10 MG tablet, Take 1 tablet (10 mg total) by mouth at bedtime., Disp: 90 tablet, Rfl: 1   pantoprazole (PROTONIX) 40 MG tablet, Take 1 tablet (40 mg total) by mouth daily., Disp: 90 tablet, Rfl: 0   potassium chloride SA (KLOR-CON) 20 MEQ tablet, Take 1 tablet (20 mEq total) by mouth daily., Disp: 90 tablet, Rfl: 1   predniSONE (DELTASONE) 20 MG tablet, Take 2 tablets (40 mg total) by mouth daily with breakfast for 5 days., Disp: 10 tablet, Rfl: 0   Probiotic Product (PROBIOTIC PO), Take by mouth daily. , Disp: , Rfl:    tamsulosin (FLOMAX) 0.4 MG CAPS capsule, Take 1 capsule (0.4 mg total) by mouth daily., Disp: 90 capsule, Rfl: 3   Allergies  Allergen Reactions   Gramineae Pollens Itching and Shortness Of Breath    Sneezing   Cat Hair Extract Itching    Watery eyes and sneezing   Breo Ellipta [Fluticasone Furoate-Vilanterol] Rash    Patient stated that it made his mouth break out   Rosuvastatin Rash and Other (See Comments)    No reaction per pt, He saw some information regarding Crestor and adverse effects      Social History   Tobacco Use   Smoking status: Former    Types: Cigarettes    Quit date: 05/16/1963    Years since quitting: 57.7   Smokeless tobacco: Never   Tobacco comments:    quit over 50 years ago  Vaping Use   Vaping Use: Never used  Substance Use Topics   Alcohol use: No   Drug use: No       Chart Review Today: I personally reviewed active problem list, medication list, allergies, family history, social history, health maintenance, notes from last encounter, lab results, imaging with the patient/caregiver today.   Review of Systems  Constitutional: Negative.  Negative for chills, diaphoresis, fatigue, fever and unexpected weight change.  HENT: Negative.    Eyes: Negative.   Respiratory: Negative.    Cardiovascular: Negative.   Gastrointestinal: Negative.   Endocrine: Negative.   Genitourinary: Negative.   Musculoskeletal: Negative.   Skin: Negative.   Allergic/Immunologic: Negative.   Neurological: Negative.   Hematological: Negative.   Psychiatric/Behavioral: Negative.    All  other systems reviewed and are negative.     Objective:   Vitals:   02/13/21 1424  BP: 124/80  Pulse: 82  Resp: 18  Temp: 97.7 F (36.5 C)  SpO2: 97%  Weight: 182 lb (82.6 kg)  Height: 5\' 7"  (1.702 m)    Body mass index is 28.51 kg/m.  Physical Exam Vitals and nursing note reviewed.  Constitutional:      General: He is not in acute distress.    Appearance: Normal appearance. He is obese. He is not ill-appearing, toxic-appearing or diaphoretic.  HENT:     Head: Normocephalic and atraumatic.     Right Ear: External ear normal.     Left Ear: External ear normal.     Nose: Congestion and rhinorrhea present.     Mouth/Throat:     Mouth: Mucous membranes are moist.     Pharynx: Oropharynx is clear. No oropharyngeal exudate.  Eyes:     General:        Right eye: No discharge.        Left eye: No discharge.     Conjunctiva/sclera: Conjunctivae normal.  Cardiovascular:     Rate and Rhythm: Normal rate and regular rhythm.     Pulses: Normal pulses.     Heart sounds: Normal heart sounds. No murmur heard.   No friction rub. No gallop.  Pulmonary:     Effort: Pulmonary effort is normal. No respiratory distress.     Breath sounds: Normal breath sounds. No stridor. No  wheezing, rhonchi or rales.  Abdominal:     General: Bowel sounds are normal.     Palpations: Abdomen is soft.  Musculoskeletal:     Right lower leg: Edema present.     Left lower leg: Edema present.  Skin:    Capillary Refill: Capillary refill takes less than 2 seconds.     Coloration: Skin is not jaundiced or pale.     Findings: No lesion or rash.  Neurological:     Mental Status: He is alert. Mental status is at baseline.  Psychiatric:        Mood and Affect: Mood normal.     Results for orders placed or performed in visit on 12/30/20  Lipid panel  Result Value Ref Range   Cholesterol 142 <200 mg/dL   HDL 48 > OR = 40 mg/dL   Triglycerides 71 <150 mg/dL   LDL Cholesterol (Calc) 79 mg/dL (calc)   Total CHOL/HDL Ratio 3.0 <5.0 (calc)   Non-HDL Cholesterol (Calc) 94 <130 mg/dL (calc)  Hemoglobin A1c  Result Value Ref Range   Hgb A1c MFr Bld 5.6 <5.7 % of total Hgb   Mean Plasma Glucose 114 mg/dL   eAG (mmol/L) 6.3 mmol/L  COMPLETE METABOLIC PANEL WITH GFR  Result Value Ref Range   Glucose, Bld 122 (H) 65 - 99 mg/dL   BUN 18 7 - 25 mg/dL   Creat 0.96 0.70 - 1.11 mg/dL   GFR, Est Non African American 69 > OR = 60 mL/min/1.50m2   GFR, Est African American 80 > OR = 60 mL/min/1.6m2   BUN/Creatinine Ratio NOT APPLICABLE 6 - 22 (calc)   Sodium 137 135 - 146 mmol/L   Potassium 5.5 (H) 3.5 - 5.3 mmol/L   Chloride 101 98 - 110 mmol/L   CO2 26 20 - 32 mmol/L   Calcium 10.0 8.6 - 10.3 mg/dL   Total Protein 7.2 6.1 - 8.1 g/dL   Albumin 4.6 3.6 - 5.1 g/dL  Globulin 2.6 1.9 - 3.7 g/dL (calc)   AG Ratio 1.8 1.0 - 2.5 (calc)   Total Bilirubin 0.8 0.2 - 1.2 mg/dL   Alkaline phosphatase (APISO) 60 35 - 144 U/L   AST 16 10 - 35 U/L   ALT 14 9 - 46 U/L  TSH  Result Value Ref Range   TSH 0.50 0.40 - 4.50 mIU/L       Assessment & Plan:     ICD-10-CM   1. Dyspnea, unspecified type  R06.00 predniSONE (DELTASONE) 20 MG tablet    budesonide-formoterol (SYMBICORT) 80-4.5 MCG/ACT  inhaler   asthma exacerbation vs CHF?  prednisone burst, start daily inhaler and allergy med with continued exposure to dog/asthma trigger     Pt was ambulated around clinic 3x on RA, SpO2 95-95%, no distress and no desat With concern with his recent hospitalization for fluid overload and CHF his weight when trending vital signs does appear to be increased about 5 pounds and he does have some pitting edema but he denies orthopnea palpitations or chest pain and he does not feel similar to how he felt in May.  I did encourage him to follow-up with his cardiologist and see if he should monitor weights further or take increased dose of Lasix.  We will treat him for asthma exacerbation and with prolonged exposure to his son's dog he will likely need more than just a burst of steroids so we discussed starting a daily inhaler and allergy medicines encouraged him to try Allegra, may also consider sending in montelukast.    Close follow-up if not improving in the next week Overall pt was well appearing, NAD, non-toxic and was able to ambulate around clinic prior to leaving w/o any signs of increased work of breathing. Pulse Oximetry reassuring:   02/13/21 1415  Resting  Supplemental oxygen during test? No  Resting Sp02 97  Lap 3 (250 feet)  02 Sat 95     Delsa Grana, PA-C 02/15/21 6:51 PM

## 2021-02-15 NOTE — Progress Notes (Signed)
   02/13/21 1415  Resting  Supplemental oxygen during test? No  Resting Sp02 97  Lap 3 (250 feet)  02 Sat 95   Per note from CMA in VS comment - not documented in VS so put in pulse Oximetry tab

## 2021-02-17 ENCOUNTER — Other Ambulatory Visit: Payer: Self-pay | Admitting: Family Medicine

## 2021-02-17 DIAGNOSIS — R0600 Dyspnea, unspecified: Secondary | ICD-10-CM

## 2021-02-27 ENCOUNTER — Ambulatory Visit: Payer: Medicare Other | Admitting: Dermatology

## 2021-02-27 ENCOUNTER — Other Ambulatory Visit: Payer: Self-pay | Admitting: Family Medicine

## 2021-02-27 NOTE — Telephone Encounter (Signed)
Requested Prescriptions  Pending Prescriptions Disp Refills  . gabapentin (NEURONTIN) 100 MG capsule [Pharmacy Med Name: Gabapentin 100 MG Oral Capsule] 90 capsule 0    Sig: TAKE 1 TO 3 CAPSULES BY  MOUTH 3 TIMES DAILY     Neurology: Anticonvulsants - gabapentin Passed - 02/27/2021 12:24 PM      Passed - Valid encounter within last 12 months    Recent Outpatient Visits          2 weeks ago Dyspnea, unspecified type   [facility] Delsa Grana, PA-C   1 month ago Senile purpura [facility])   [facility] Steele Sizer, MD   7 months ago COVID-97   [facility] Steele Sizer, MD   8 months ago Senile purpura [facility])   [facility] Steele Sizer, MD   1 year ago Thoracic aortic aneurysm without rupture [facility])   [facility] Steele Sizer, MD      Future Appointments            In 1 week Ralene Bathe, MD [place]   In 4 months Steele Sizer, MD [facility] [place]   In 8 months  [facility]

## 2021-03-06 ENCOUNTER — Other Ambulatory Visit: Payer: Self-pay

## 2021-03-06 ENCOUNTER — Ambulatory Visit: Payer: Medicare Other | Admitting: Dermatology

## 2021-03-06 DIAGNOSIS — L820 Inflamed seborrheic keratosis: Secondary | ICD-10-CM

## 2021-03-06 DIAGNOSIS — L570 Actinic keratosis: Secondary | ICD-10-CM

## 2021-03-06 DIAGNOSIS — L719 Rosacea, unspecified: Secondary | ICD-10-CM | POA: Diagnosis not present

## 2021-03-06 DIAGNOSIS — B360 Pityriasis versicolor: Secondary | ICD-10-CM | POA: Diagnosis not present

## 2021-03-06 DIAGNOSIS — Z1283 Encounter for screening for malignant neoplasm of skin: Secondary | ICD-10-CM

## 2021-03-06 MED ORDER — KETOCONAZOLE 2 % EX CREA: 1.0000 "application " | TOPICAL_CREAM | Freq: Every day | CUTANEOUS | 2 refills | Status: AC

## 2021-03-06 NOTE — Patient Instructions (Signed)

## 2021-03-06 NOTE — Progress Notes (Signed)
   Follow-Up Visit   Subjective  Mark [name] is a 87 y.o. male who presents for the following: Actinic Keratosis (Face, scalp, 7mf/u), Rosacea (Face, 347m/u, pt not taking doxycycline, Skin Medicinals Triple cream bid), and Pruritis (With bumps, neck, >70m55mt using TMC 0.1% cr bid). The patient presents for Upper Body Skin Exam (UBSE) for skin cancer screening and mole check.  The following portions of the chart were reviewed this encounter and updated as appropriate:   Tobacco  Allergies  Meds  Problems  Med Hx  Surg Hx  Fam Hx     Review of Systems:  No other skin or systemic complaints except as noted in HPI or Assessment and Plan.  Objective  Well appearing patient in no apparent distress; mood and affect are within normal limits.  All skin waist up examined.  scalp, ears, face, x 19 (19) Pink scaly macules   posterior neck and R neck x 20, Total = 20 (20) Erythematous keratotic or waxy stuck-on papule or plaque.   neck Pink patchiness neck   Assessment & Plan  AK (actinic keratosis) (19) scalp, ears, face, x 19  Destruction of lesion - scalp, ears, face, x 19 Complexity: simple   Destruction method: cryotherapy   Informed consent: discussed and consent obtained   Timeout:  patient name, date of birth, surgical site, and procedure verified Lesion destroyed using liquid nitrogen: Yes   Region frozen until ice ball extended beyond lesion: Yes   Outcome: patient tolerated procedure well with no complications   Post-procedure details: wound care instructions given    Rosacea face  Rosacea is a chronic progressive skin condition usually affecting the face of adults, causing redness and/or acne bumps. It is treatable but not curable. It sometimes affects the eyes (ocular rosacea) as well. It may respond to topical and/or systemic medication and can flare with stress, sun exposure, alcohol, exercise and some foods.  Daily application of broad spectrum spf 30+  sunscreen to face is recommended to reduce flares. Discussed treatment options.  Patient declines treatment  Inflamed seborrheic keratosis posterior neck and R neck x 20, Total = 20  Destruction of lesion - posterior neck and R neck x 20, Total = 20 Complexity: simple   Destruction method: cryotherapy   Informed consent: discussed and consent obtained   Timeout:  patient name, date of birth, surgical site, and procedure verified Lesion destroyed using liquid nitrogen: Yes   Region frozen until ice ball extended beyond lesion: Yes   Outcome: patient tolerated procedure well with no complications   Post-procedure details: wound care instructions given    Tinea versicolor neck Chronic and persistent Start Ketoconazole 2% cr qhs to neck D/C TMC 0.1% cr  ketoconazole (NIZORAL) 2 % cream - neck Apply 1 application topically at bedtime. Qhs to neck  Skin cancer screening  Return in about 2 months (around 05/06/2021) for AK f/u, Tinea Versicolor f/u.  I, SonOthelia PullingMA, am acting as scribe for Mark [name] . Documentation: I have reviewed the above documentation for accuracy and completeness, and I agree with the above.  Mark [name]

## 2021-03-07 ENCOUNTER — Encounter: Payer: Self-pay | Admitting: Dermatology

## 2021-03-27 ENCOUNTER — Telehealth: Payer: Self-pay

## 2021-03-27 NOTE — Telephone Encounter (Signed)
Tried contacting pt to schedule appointment. No answer and no voicemail.  Copied from [place] [id number]. Topic: General - Other >> Mar 27, 2021  9:18 AM Tessa Lerner A wrote: Reason for CRM: The patient shares that they are experiencing allergic discomfort because their son's dog is visiting from out of town   The patient shares that they've experienced these symptoms (congestion and drainage) previously and was prescribed a Symbicort inhaler as well as antibiotics to clear up congestion that occurred   The patient would like to be re-prescribed the medications if possible  Please contact to further advise

## 2021-03-28 ENCOUNTER — Other Ambulatory Visit: Payer: Self-pay

## 2021-03-28 MED ORDER — GABAPENTIN 100 MG PO CAPS: ORAL_CAPSULE | ORAL | 0 refills | Status: DC

## 2021-03-28 NOTE — Progress Notes (Signed)
Name: Mark [name]   MRN: [id number]    DOB: 18-May-1929   Date:03/29/2021       Progress Note  Subjective  Chief Complaint  Dog Allergies  I connected with  Mark [name]  on 03/29/21 at  2:20 PM EDT by a video enabled telemedicine application and verified that I am speaking with the correct person using two identifiers.  I discussed the limitations of evaluation and management by telemedicine and the availability of in person appointments. The patient expressed understanding and agreed to proceed with the virtual visit  Staff also discussed with the patient that there may be a patient responsible charge related to this service. Patient Location: at home  Provider Location: [facility]  Additional Individuals present: he is parked in his car, son is with him   HPI  COPD/Asthma/pulmonary fibrosis: patient was seen by Delsa Grana , PA back in July with similar symptoms. He states his son came to visit him in June and his allergies gets worse because of he brought his dog ( patient is allergic to dogs) . He ran out of his Symbicort yesterday. He has noticed he needs to clear his throat, coughing at night , mild SOB with activity. He denies fever or chills. No hoarseness or sore throat. Normal appetite. He would like to go back on prednisone. Discussed trying switching to Trelegy first and add a nasal spray and if no improvement to call me back for prednisone. His son is going back to [place] next week and if truly secondary to allergies his symptoms should improve shortly after  He used to smoke, from teenage years to early 44's   Patient Active Problem List   Diagnosis Date Noted   HFrEF (heart failure with reduced ejection fraction) ([place]) 12/02/2020   Cardiac syncope 09/22/2019   Respiratory tract infection due to COVID-19 virus 09/04/2019   Fibrosis of lung ([place]) 09/29/2018   Senile purpura ([place]) 09/29/2018   Atherosclerosis of aorta ([place]) 09/29/2018   Hardening of the aorta (main artery of  the heart) ([place]) 09/29/2018   Abnormal ECG 06/16/2018   Thoracic aortic aneurysm without rupture ([place]) 05/28/2018   CHF (congestive heart failure), NYHA class III, chronic, systolic ([place]) [id number]   BPH with obstruction/lower urinary tract symptoms 02/26/2018   Ischemic cardiomyopathy 06/11/2017   Benign essential HTN 05/22/2017   Bilateral carotid artery stenosis 05/22/2017   Neuropathy 01/28/2017   Right leg paresthesias 11/26/2016   Hypertension 08/27/2016   GERD (gastroesophageal reflux disease) 08/27/2016   Hyperglycemia 05/10/2016   History of nonmelanoma skin cancer 10/10/2015   Pain in right shoulder 09/13/2015   Osteoarthritis 07/06/2015   Lymphedema 05/30/2015   Other specified soft tissue disorders 05/30/2015   Hyperlipidemia 03/29/2015   Family history of type 2 diabetes mellitus in father 03/29/2015   DD (diverticular disease) 02/03/2015   Diverticulosis of intestine without perforation or abscess without bleeding 02/03/2015   DDD (degenerative disc disease), lumbar 03/03/2014   Intervertebral disc disorder with radiculopathy of lumbar region 03/03/2014   Lumbar radiculitis 03/03/2014   Lumbar stenosis with neurogenic claudication 03/03/2014   Arteriosclerosis of coronary artery 04/18/2012   S/P coronary artery stent placement 04/18/2012   Status post percutaneous transluminal coronary angioplasty 04/18/2012    Past Surgical History:  Procedure Laterality Date   CATARACT EXTRACTION W/PHACO Right 11/24/2019   Procedure: CATARACT EXTRACTION PHACO AND INTRAOCULAR LENS PLACEMENT (IOC) RIGHT MALYUGIN 8.59 00:50.8;  Surgeon: Birder Robson, MD;  Location: [place];  Service: Ophthalmology;  Laterality: Right;  COVID ( + ) 09-01-19   CATARACT EXTRACTION W/PHACO Left 12/15/2019   Procedure: CATARACT EXTRACTION PHACO AND INTRAOCULAR LENS PLACEMENT (IOC) LEFT 9.90  01:03.7;  Surgeon: Birder Robson, MD;  Location: [place];  Service: Ophthalmology;   Laterality: Left;   CORONARY ANGIOPLASTY WITH STENT PLACEMENT  2005   [facility]    Family History  Problem Relation Age of Onset   Hypertension Mother    Heart disease Mother    Heart disease Father    Hypertension Father     Social History   Socioeconomic History   Marital status: Widowed    Spouse name: Not on file   Number of children: 3   Years of education: Not on file   Highest education level: 10th grade  Occupational History   Occupation: retired   Tobacco Use   Smoking status: Former    Types: Cigarettes    Quit date: 05/16/1963    Years since quitting: 57.9   Smokeless tobacco: Never   Tobacco comments:    quit over 50 years ago  Vaping Use   Vaping Use: Never used  Substance and Sexual Activity   Alcohol use: No   Drug use: No   Sexual activity: Not Currently    Partners: Female  Other Topics Concern   Not on file  Social History Narrative   Pt lives alone.    Social Determinants of Health   Financial Resource Strain: Low Risk    Difficulty of Paying Living Expenses: Not hard at all  Food Insecurity: No Food Insecurity   Worried About Charity fundraiser in the Last Year: Never true   [place] in the Last Year: Never true  Transportation Needs: No Transportation Needs   Lack of Transportation (Medical): No   Lack of Transportation (Non-Medical): No  Physical Activity: Insufficiently Active   Days of Exercise per Week: 2 days   Minutes of Exercise per Session: 20 min  Stress: No Stress Concern Present   Feeling of Stress : Not at all  Social Connections: Moderately Integrated   Frequency of Communication with Friends and Family: More than three times a week   Frequency of Social Gatherings with Friends and Family: Twice a week   Attends Religious Services: More than 4 times per year   Active Member of [employer] or Organizations: No   Attends Archivist Meetings: Never   Marital Status: Married  Human resources officer Violence: Not At Risk    Fear of Current or Ex-Partner: No   Emotionally Abused: No   Physically Abused: No   Sexually Abused: No     Current Outpatient Medications:    acetaminophen (TYLENOL) 500 MG tablet, Take 500 mg by mouth every 6 (six) hours as needed., Disp: , Rfl:    albuterol (VENTOLIN HFA) 108 (90 Base) MCG/ACT inhaler, USE 2 INHALATIONS BY MOUTH  EVERY 6 HOURS AS NEEDED FOR WHEEZING OR SHORTNESS OF  BREATH, Disp: 18 g, Rfl: 1   aspirin EC 81 MG tablet, Take 81 mg by mouth daily. , Disp: , Rfl:    atorvastatin (LIPITOR) 10 MG tablet, Take 1 tablet (10 mg total) by mouth daily., Disp: 90 tablet, Rfl: 1   budesonide-formoterol (SYMBICORT) 80-4.5 MCG/ACT inhaler, Inhale 2 puffs into the lungs 2 (two) times daily., Disp: 1 each, Rfl: 3   diclofenac Sodium (VOLTAREN) 1 % GEL, APPLY 4 GRAMS TOPICALLY 4  TIMES DAILY, Disp: 600 g, Rfl: 0   furosemide (  LASIX) 20 MG tablet, Take 1 tablet (20 mg total) by mouth daily., Disp: 90 tablet, Rfl: 1   gabapentin (NEURONTIN) 100 MG capsule, TAKE 1 TO 3 CAPSULES BY  MOUTH 3 TIMES DAILY, Disp: 90 capsule, Rfl: 0   JARDIANCE 10 MG TABS tablet, Take 1 tablet (10 mg total) by mouth daily., Disp: 90 tablet, Rfl: 1   ketoconazole (NIZORAL) 2 % cream, Apply 1 application topically at bedtime. Qhs to neck, Disp: 60 g, Rfl: 2   Loratadine 10 MG CAPS, Take 10 mg by mouth daily. , Disp: , Rfl:    montelukast (SINGULAIR) 10 MG tablet, Take 1 tablet (10 mg total) by mouth at bedtime., Disp: 90 tablet, Rfl: 1   pantoprazole (PROTONIX) 40 MG tablet, Take 1 tablet (40 mg total) by mouth daily., Disp: 90 tablet, Rfl: 0   potassium chloride SA (KLOR-CON) 20 MEQ tablet, Take 1 tablet (20 mEq total) by mouth daily., Disp: 90 tablet, Rfl: 1   Probiotic Product (PROBIOTIC PO), Take by mouth daily. , Disp: , Rfl:    tamsulosin (FLOMAX) 0.4 MG CAPS capsule, Take 1 capsule (0.4 mg total) by mouth daily., Disp: 90 capsule, Rfl: 3  Allergies  Allergen Reactions   Gramineae Pollens Itching and  Shortness Of Breath    Sneezing   Cat Hair Extract Itching    Watery eyes and sneezing   Breo Ellipta [Fluticasone Furoate-Vilanterol] Rash    Patient stated that it made his mouth break out   Rosuvastatin Rash and Other (See Comments)    No reaction per pt, He saw some information regarding Crestor and adverse effects     I personally reviewed active problem list, medication list, allergies, family history, social history, health maintenance with the patient/caregiver today.   ROS  Ten systems reviewed and is negative except as mentioned in HPI   Objective  Virtual encounter, vitals not obtained.  There is no height or weight on file to calculate BMI.  Physical Exam  Awake, alert and oriented  PHQ2/9: Depression screen [facility] 2/9 03/29/2021 02/13/2021 12/30/2020 11/17/2020 07/20/2020  Decreased Interest 0 0 2 0 0  Down, Depressed, Hopeless 0 0 0 0 0  PHQ - 2 Score 0 0 2 0 0  Altered sleeping - 0 0 - -  Tired, decreased energy - 0 3 - -  Change in appetite - 0 0 - -  Feeling bad or failure about yourself  - 0 0 - -  Trouble concentrating - 0 0 - -  Moving slowly or fidgety/restless - 0 0 - -  Suicidal thoughts - 0 0 - -  PHQ-9 Score - 0 5 - -  Difficult doing work/chores - Not difficult at all - - -  Some recent data might be hidden   PHQ-2/9 Result is negative.    Fall Risk: Fall Risk  03/29/2021 02/13/2021 12/30/2020 11/17/2020 07/20/2020  Falls in the past year? 0 1 1 0 0  Number falls in past yr: 0 1 1 0 0  Injury with Fall? 0 1 1 0 0  Risk for fall due to : - - - No Fall Risks -  Follow up - - - Falls prevention discussed -     Assessment & Plan  1. Fibrosis of lung ([place])  - Budeson-Glycopyrrol-Formoterol (BREZTRI AEROSPHERE) 160-9-4.8 MCG/ACT AERO; Inhale 2 puffs into the lungs 2 (two) times daily.  Dispense: 10.7 g; Refill: 2  2. COPD with asthma ([place])  - benzonatate (TESSALON) 100 MG capsule; Take  1-2 capsules (100-200 mg total) by mouth 2 (two) times daily  as needed.  Dispense: 60 capsule; Refill: 0 - Budeson-Glycopyrrol-Formoterol (BREZTRI AEROSPHERE) 160-9-4.8 MCG/ACT AERO; Inhale 2 puffs into the lungs 2 (two) times daily.  Dispense: 10.7 g; Refill: 2  3. Allergy to dogs  - azelastine (ASTELIN) 0.1 % nasal spray; Place 1 spray into both nostrils 2 (two) times daily. Use in each nostril as directed  Dispense: 30 mL; Refill: 2   I discussed the assessment and treatment plan with the patient. The patient was provided an opportunity to ask questions and all were answered. The patient agreed with the plan and demonstrated an understanding of the instructions.  The patient was advised to call back or seek an in-person evaluation if the symptoms worsen or if the condition fails to improve as anticipated.  I provided 25  minutes of non-face-to-face time during this encounter.

## 2021-03-29 ENCOUNTER — Other Ambulatory Visit: Payer: Self-pay | Admitting: Family Medicine

## 2021-03-29 ENCOUNTER — Telehealth: Payer: Medicare Other | Admitting: Family Medicine

## 2021-03-29 ENCOUNTER — Encounter: Payer: Self-pay | Admitting: Family Medicine

## 2021-03-29 ENCOUNTER — Telehealth (INDEPENDENT_AMBULATORY_CARE_PROVIDER_SITE_OTHER): Payer: Medicare Other | Admitting: Family Medicine

## 2021-03-29 DIAGNOSIS — J449 Chronic obstructive pulmonary disease, unspecified: Secondary | ICD-10-CM

## 2021-03-29 DIAGNOSIS — J8410 Pulmonary fibrosis, unspecified: Secondary | ICD-10-CM | POA: Diagnosis not present

## 2021-03-29 DIAGNOSIS — M170 Bilateral primary osteoarthritis of knee: Secondary | ICD-10-CM

## 2021-03-29 DIAGNOSIS — J3081 Allergic rhinitis due to animal (cat) (dog) hair and dander: Secondary | ICD-10-CM | POA: Diagnosis not present

## 2021-03-29 MED ORDER — AZELASTINE HCL 0.1 % NA SOLN: 1.0000 | Freq: Two times a day (BID) | NASAL | 2 refills | Status: DC

## 2021-03-29 MED ORDER — BENZONATATE 100 MG PO CAPS: 100.0000 mg | ORAL_CAPSULE | Freq: Two times a day (BID) | ORAL | 0 refills | Status: DC | PRN

## 2021-03-29 MED ORDER — BREZTRI AEROSPHERE 160-9-4.8 MCG/ACT IN AERO: 2.0000 | INHALATION_SPRAY | Freq: Two times a day (BID) | RESPIRATORY_TRACT | 2 refills | Status: DC

## 2021-04-07 ENCOUNTER — Ambulatory Visit (INDEPENDENT_AMBULATORY_CARE_PROVIDER_SITE_OTHER): Payer: Medicare Other | Admitting: Family Medicine

## 2021-04-07 ENCOUNTER — Encounter: Payer: Self-pay | Admitting: Family Medicine

## 2021-04-07 DIAGNOSIS — J449 Chronic obstructive pulmonary disease, unspecified: Secondary | ICD-10-CM | POA: Diagnosis not present

## 2021-04-07 MED ORDER — PREDNISONE 10 MG (21) PO TBPK: ORAL_TABLET | ORAL | 0 refills | Status: DC

## 2021-04-07 NOTE — Progress Notes (Signed)
Name: Mark [name]   MRN: [id number]    DOB: 1929/01/28   Date:04/07/2021       Progress Note  Subjective  Chief Complaint  Chief Complaint  Patient presents with   Nasal Congestion    I connected with  Mark [name]  on 04/07/21 at 11:40 AM EDT by a video enabled telemedicine application and verified that I am speaking with the correct person using two identifiers.  I discussed the limitations of evaluation and management by telemedicine and the availability of in person appointments. The patient expressed understanding and agreed to proceed with the virtual visit  Staff also discussed with the patient that there may be a patient responsible charge related to this service. Patient Location: at home  Provider Location: [facility] Additional Individuals present: none  HPI  COPD/Asthma/pulmonary fibrosis/dog allergies: he had a virtual visit with me last week, he was given Tessalon perles and Trelegy to see if it would improve his symptoms. He states Trelegy was too expensive and tessalon did not work. He is taking mucinex otc , nasal spray, and Ventolin prn. He states cough has improved but still has nasal congestion , has to clear his throat and has nasal congestion. SOB is stable, he called because his son is still at his house with the dog and is tired of symptoms. He requested steroid taper again. Discussed risk of steroid use, including only temporary relief and also immunosuppression.  Patient Active Problem List   Diagnosis Date Noted   HFrEF (heart failure with reduced ejection fraction) ([place]) 12/02/2020   Cardiac syncope 09/22/2019   Respiratory tract infection due to COVID-19 virus 09/04/2019   Fibrosis of lung ([place]) 09/29/2018   Senile purpura ([place]) 09/29/2018   Atherosclerosis of aorta ([place]) 09/29/2018   Hardening of the aorta (main artery of the heart) ([place]) 09/29/2018   Abnormal ECG 06/16/2018   Thoracic aortic aneurysm without rupture ([place]) 05/28/2018   CHF (congestive  heart failure), NYHA class III, chronic, systolic ([place]) [id number]   BPH with obstruction/lower urinary tract symptoms 02/26/2018   Ischemic cardiomyopathy 06/11/2017   Benign essential HTN 05/22/2017   Bilateral carotid artery stenosis 05/22/2017   Neuropathy 01/28/2017   Right leg paresthesias 11/26/2016   Hypertension 08/27/2016   GERD (gastroesophageal reflux disease) 08/27/2016   Hyperglycemia 05/10/2016   History of nonmelanoma skin cancer 10/10/2015   Pain in right shoulder 09/13/2015   Osteoarthritis 07/06/2015   Lymphedema 05/30/2015   Other specified soft tissue disorders 05/30/2015   Hyperlipidemia 03/29/2015   Family history of type 2 diabetes mellitus in father 03/29/2015   DD (diverticular disease) 02/03/2015   Diverticulosis of intestine without perforation or abscess without bleeding 02/03/2015   DDD (degenerative disc disease), lumbar 03/03/2014   Intervertebral disc disorder with radiculopathy of lumbar region 03/03/2014   Lumbar radiculitis 03/03/2014   Lumbar stenosis with neurogenic claudication 03/03/2014   Arteriosclerosis of coronary artery 04/18/2012   S/P coronary artery stent placement 04/18/2012   Status post percutaneous transluminal coronary angioplasty 04/18/2012    Past Surgical History:  Procedure Laterality Date   CATARACT EXTRACTION W/PHACO Right 11/24/2019   Procedure: CATARACT EXTRACTION PHACO AND INTRAOCULAR LENS PLACEMENT (IOC) RIGHT MALYUGIN 8.59 00:50.8;  Surgeon: Birder Robson, MD;  Location: [place];  Service: Ophthalmology;  Laterality: Right;  COVID ( + ) 09-01-19   CATARACT EXTRACTION W/PHACO Left 12/15/2019   Procedure: CATARACT EXTRACTION PHACO AND INTRAOCULAR LENS PLACEMENT (IOC) LEFT 9.90  01:03.7;  Surgeon: Birder Robson, MD;  Location: [place];  Service: Ophthalmology;  Laterality: Left;   CORONARY ANGIOPLASTY WITH STENT PLACEMENT  2005   [facility]    Family History  Problem Relation Age of Onset    Hypertension Mother    Heart disease Mother    Heart disease Father    Hypertension Father     Social History   Socioeconomic History   Marital status: Widowed    Spouse name: Not on file   Number of children: 3   Years of education: Not on file   Highest education level: 10th grade  Occupational History   Occupation: retired   Tobacco Use   Smoking status: Former    Types: Cigarettes    Quit date: 05/16/1963    Years since quitting: 57.9   Smokeless tobacco: Never   Tobacco comments:    quit over 50 years ago  Vaping Use   Vaping Use: Never used  Substance and Sexual Activity   Alcohol use: No   Drug use: No   Sexual activity: Not Currently    Partners: Female  Other Topics Concern   Not on file  Social History Narrative   Pt lives alone.    Social Determinants of Health   Financial Resource Strain: Low Risk    Difficulty of Paying Living Expenses: Not hard at all  Food Insecurity: No Food Insecurity   Worried About Charity fundraiser in the Last Year: Never true   [place] in the Last Year: Never true  Transportation Needs: No Transportation Needs   Lack of Transportation (Medical): No   Lack of Transportation (Non-Medical): No  Physical Activity: Insufficiently Active   Days of Exercise per Week: 2 days   Minutes of Exercise per Session: 20 min  Stress: No Stress Concern Present   Feeling of Stress : Not at all  Social Connections: Moderately Integrated   Frequency of Communication with Friends and Family: More than three times a week   Frequency of Social Gatherings with Friends and Family: Twice a week   Attends Religious Services: More than 4 times per year   Active Member of [employer] or Organizations: No   Attends Archivist Meetings: Never   Marital Status: Married  Human resources officer Violence: Not At Risk   Fear of Current or Ex-Partner: No   Emotionally Abused: No   Physically Abused: No   Sexually Abused: No     Current  Outpatient Medications:    acetaminophen (TYLENOL) 500 MG tablet, Take 500 mg by mouth every 6 (six) hours as needed., Disp: , Rfl:    albuterol (VENTOLIN HFA) 108 (90 Base) MCG/ACT inhaler, USE 2 INHALATIONS BY MOUTH  EVERY 6 HOURS AS NEEDED FOR WHEEZING OR SHORTNESS OF  BREATH, Disp: 18 g, Rfl: 1   aspirin EC 81 MG tablet, Take 81 mg by mouth daily. , Disp: , Rfl:    atorvastatin (LIPITOR) 10 MG tablet, Take 1 tablet (10 mg total) by mouth daily., Disp: 90 tablet, Rfl: 1   azelastine (ASTELIN) 0.1 % nasal spray, Place 1 spray into both nostrils 2 (two) times daily. Use in each nostril as directed, Disp: 30 mL, Rfl: 2   benzonatate (TESSALON) 100 MG capsule, Take 1-2 capsules (100-200 mg total) by mouth 2 (two) times daily as needed., Disp: 60 capsule, Rfl: 0   Budeson-Glycopyrrol-Formoterol (BREZTRI AEROSPHERE) 160-9-4.8 MCG/ACT AERO, Inhale 2 puffs into the lungs 2 (two) times daily., Disp: 10.7 g, Rfl: 2   diclofenac Sodium (  VOLTAREN) 1 % GEL, APPLY 4 GRAMS TOPICALLY 4  TIMES DAILY, Disp: 600 g, Rfl: 0   furosemide (LASIX) 20 MG tablet, Take 1 tablet (20 mg total) by mouth daily., Disp: 90 tablet, Rfl: 1   gabapentin (NEURONTIN) 100 MG capsule, TAKE 1 TO 3 CAPSULES BY  MOUTH 3 TIMES DAILY, Disp: 90 capsule, Rfl: 0   JARDIANCE 10 MG TABS tablet, Take 1 tablet (10 mg total) by mouth daily., Disp: 90 tablet, Rfl: 1   ketoconazole (NIZORAL) 2 % cream, Apply 1 application topically at bedtime. Qhs to neck, Disp: 60 g, Rfl: 2   Loratadine 10 MG CAPS, Take 10 mg by mouth daily. , Disp: , Rfl:    montelukast (SINGULAIR) 10 MG tablet, Take 1 tablet (10 mg total) by mouth at bedtime., Disp: 90 tablet, Rfl: 1   pantoprazole (PROTONIX) 40 MG tablet, Take 1 tablet (40 mg total) by mouth daily., Disp: 90 tablet, Rfl: 0   potassium chloride SA (KLOR-CON) 20 MEQ tablet, Take 1 tablet (20 mEq total) by mouth daily., Disp: 90 tablet, Rfl: 1   predniSONE (STERAPRED UNI-PAK 21 TAB) 10 MG (21) TBPK tablet, Take as  directed, Disp: 21 tablet, Rfl: 0   Probiotic Product (PROBIOTIC PO), Take by mouth daily. , Disp: , Rfl:    tamsulosin (FLOMAX) 0.4 MG CAPS capsule, Take 1 capsule (0.4 mg total) by mouth daily., Disp: 90 capsule, Rfl: 3  Allergies  Allergen Reactions   Gramineae Pollens Itching and Shortness Of Breath    Sneezing   Cat Hair Extract Itching    Watery eyes and sneezing   Breo Ellipta [Fluticasone Furoate-Vilanterol] Rash    Patient stated that it made his mouth break out   Rosuvastatin Rash and Other (See Comments)    No reaction per pt, He saw some information regarding Crestor and adverse effects     I personally reviewed active problem list, medication list, allergies, family history, social history, health maintenance with the patient/caregiver today.   ROS  Ten systems reviewed and is negative except as mentioned in HPI   Objective  Virtual encounter, vitals not obtained.  There is no height or weight on file to calculate BMI.  Physical Exam  Awake, alert and oriented   PHQ2/9: Depression screen [facility] 2/9 04/07/2021 03/29/2021 02/13/2021 12/30/2020 11/17/2020  Decreased Interest 0 0 0 2 0  Down, Depressed, Hopeless 0 0 0 0 0  PHQ - 2 Score 0 0 0 2 0  Altered sleeping - - 0 0 -  Tired, decreased energy - - 0 3 -  Change in appetite - - 0 0 -  Feeling bad or failure about yourself  - - 0 0 -  Trouble concentrating - - 0 0 -  Moving slowly or fidgety/restless - - 0 0 -  Suicidal thoughts - - 0 0 -  PHQ-9 Score - - 0 5 -  Difficult doing work/chores - - Not difficult at all - -  Some recent data might be hidden   PHQ-2/9 Result is negative.    Fall Risk: Fall Risk  04/07/2021 03/29/2021 02/13/2021 12/30/2020 11/17/2020  Falls in the past year? 0 0 1 1 0  Number falls in past yr: 0 0 1 1 0  Injury with Fall? 0 0 1 1 0  Risk for fall due to : - - - - No Fall Risks  Follow up Falls prevention discussed - - - Falls prevention discussed     Assessment & Plan  1. COPD with  asthma ([place])  - predniSONE (STERAPRED UNI-PAK 21 TAB) 10 MG (21) TBPK tablet; Take as directed  Dispense: 21 tablet; Refill: 0   I discussed the assessment and treatment plan with the patient. The patient was provided an opportunity to ask questions and all were answered. The patient agreed with the plan and demonstrated an understanding of the instructions.  The patient was advised to call back or seek an in-person evaluation if the symptoms worsen or if the condition fails to improve as anticipated.  I provided 15 minutes of non-face-to-face time during this encounter.

## 2021-04-10 ENCOUNTER — Ambulatory Visit: Payer: Medicare Other | Admitting: Family Medicine

## 2021-04-13 DIAGNOSIS — I255 Ischemic cardiomyopathy: Secondary | ICD-10-CM | POA: Diagnosis not present

## 2021-04-13 DIAGNOSIS — I25118 Atherosclerotic heart disease of native coronary artery with other forms of angina pectoris: Secondary | ICD-10-CM | POA: Diagnosis not present

## 2021-04-13 DIAGNOSIS — I5022 Chronic systolic (congestive) heart failure: Secondary | ICD-10-CM | POA: Diagnosis not present

## 2021-04-13 DIAGNOSIS — I10 Essential (primary) hypertension: Secondary | ICD-10-CM | POA: Diagnosis not present

## 2021-04-13 DIAGNOSIS — J8410 Pulmonary fibrosis, unspecified: Secondary | ICD-10-CM | POA: Diagnosis not present

## 2021-04-13 DIAGNOSIS — I712 Thoracic aortic aneurysm, without rupture: Secondary | ICD-10-CM | POA: Diagnosis not present

## 2021-04-13 DIAGNOSIS — Z9861 Coronary angioplasty status: Secondary | ICD-10-CM | POA: Diagnosis not present

## 2021-05-04 ENCOUNTER — Other Ambulatory Visit: Payer: Self-pay | Admitting: Family Medicine

## 2021-05-04 DIAGNOSIS — I5022 Chronic systolic (congestive) heart failure: Secondary | ICD-10-CM

## 2021-05-11 ENCOUNTER — Encounter: Payer: Self-pay | Admitting: Dermatology

## 2021-05-11 ENCOUNTER — Other Ambulatory Visit: Payer: Self-pay

## 2021-05-11 ENCOUNTER — Ambulatory Visit: Payer: Medicare Other | Admitting: Dermatology

## 2021-05-11 DIAGNOSIS — L578 Other skin changes due to chronic exposure to nonionizing radiation: Secondary | ICD-10-CM | POA: Diagnosis not present

## 2021-05-11 DIAGNOSIS — L570 Actinic keratosis: Secondary | ICD-10-CM

## 2021-05-11 DIAGNOSIS — B360 Pityriasis versicolor: Secondary | ICD-10-CM

## 2021-05-11 DIAGNOSIS — L820 Inflamed seborrheic keratosis: Secondary | ICD-10-CM

## 2021-05-11 NOTE — Progress Notes (Signed)
Follow-Up Visit   Subjective  Mark [name] is a 87 y.o. male who presents for the following: Actinic Keratosis (Face, scalp, ears, 91m f/u, LN2 in past) and Tinea Versicolor (Neck, 7m f/u, Ketoconazole 2% cr qhs, still itching).  The following portions of the chart were reviewed this encounter and updated as appropriate:   Tobacco  Allergies  Meds  Problems  Med Hx  Surg Hx  Fam Hx     Review of Systems:  No other skin or systemic complaints except as noted in HPI or Assessment and Plan.  Objective  Well appearing patient in no apparent distress; mood and affect are within normal limits.  A focused examination was performed including face, scalp, ears, neck. Relevant physical exam findings are noted in the Assessment and Plan.  R post neck x 4, Total = 4 Erythematous keratotic or waxy stuck-on papule or plaque.   Scalp x 16, face x 8 (24) Pink scaly macules  neck Neck clear of tinea versicolor today   Assessment & Plan  Inflamed seborrheic keratosis R post neck x 4, Total = 4  Destruction of lesion - R post neck x 4, Total = 4 Complexity: simple   Destruction method: cryotherapy   Informed consent: discussed and consent obtained   Timeout:  patient name, date of birth, surgical site, and procedure verified Lesion destroyed using liquid nitrogen: Yes   Region frozen until ice ball extended beyond lesion: Yes   Outcome: patient tolerated procedure well with no complications   Post-procedure details: wound care instructions given    AK (actinic keratosis) (24) Scalp x 16, face x 8  Destruction of lesion - Scalp x 16, face x 8 Complexity: simple   Destruction method: cryotherapy   Informed consent: discussed and consent obtained   Timeout:  patient name, date of birth, surgical site, and procedure verified Lesion destroyed using liquid nitrogen: Yes   Region frozen until ice ball extended beyond lesion: Yes   Outcome: patient tolerated procedure well with no  complications   Post-procedure details: wound care instructions given    Tinea versicolor neck Clear today D/C Ketoconazole 2% cr Tinea versicolor is a chronic recurrent skin rash causing discolored scaly spots most commonly seen on back, chest, and/or shoulders.  It is generally asymptomatic. The rash is due to overgrowth of a common type of yeast present on everyone's skin and it is not contagious.  It tends to flare more in the summer due to increased sweating on trunk.  After rash is treated, the scaliness will resolve, but the discoloration will take longer to return to normal pigmentation. The periodic use of an OTC medicated soap/shampoo with zinc or selenium sulfide can be helpful to prevent yeast overgrowth and recurrence.  Actinic Damage - Severe, confluent actinic changes with pre-cancerous actinic keratoses  - Severe, chronic, not at goal, secondary to cumulative UV radiation exposure over time - diffuse scaly erythematous macules and papules with underlying dyspigmentation - Discussed Prescription "Field Treatment" for Severe, Chronic Confluent Actinic Changes with Pre-Cancerous Actinic Keratoses Field treatment involves treatment of an entire area of skin that has confluent Actinic Changes (Sun/ Ultraviolet light damage) and PreCancerous Actinic Keratoses by method of PhotoDynamic Therapy (PDT) and/or prescription Topical Chemotherapy agents such as 5-fluorouracil, 5-fluorouracil/calcipotriene, and/or imiquimod.  The purpose is to decrease the number of clinically evident and subclinical PreCancerous lesions to prevent progression to development of skin cancer by chemically destroying early precancer changes that may or may not be visible.  It has been shown to reduce the risk of developing skin cancer in the treated area. As a result of treatment, redness, scaling, crusting, and open sores may occur during treatment course. One or more than one of these methods may be used and may have to  be used several times to control, suppress and eliminate the PreCancerous changes. Discussed treatment course, expected reaction, and possible side effects. - Recommend daily broad spectrum sunscreen SPF 30+ to sun-exposed areas, reapply every 2 hours as needed.  - Staying in the shade or wearing long sleeves, sun glasses (UVA+UVB protection) and wide brim hats (4-inch brim around the entire circumference of the hat) are also recommended. - Call for new or changing lesions.  -Recommend PDT with ALA to scalp in 1 month  Return in about 1 month (around 06/11/2021) for PDT scalp, 70m f/u AKs.  I, Othelia Pulling, RMA, am acting as scribe for Sarina Ser, MD . Documentation: I have reviewed the above documentation for accuracy and completeness, and I agree with the above.  Sarina Ser, MD

## 2021-05-11 NOTE — Patient Instructions (Signed)

## 2021-05-24 ENCOUNTER — Other Ambulatory Visit: Payer: Self-pay | Admitting: Family Medicine

## 2021-05-24 MED ORDER — GABAPENTIN 100 MG PO CAPS: ORAL_CAPSULE | ORAL | 2 refills | Status: DC

## 2021-05-24 NOTE — Telephone Encounter (Signed)
Requested Prescriptions  Pending Prescriptions Disp Refills  . gabapentin (NEURONTIN) 100 MG capsule 90 capsule 2    Sig: TAKE 1 TO 3 CAPSULES BY  MOUTH 3 TIMES DAILY     Neurology: Anticonvulsants - gabapentin Passed - 05/24/2021 11:38 AM      Passed - Valid encounter within last 12 months    Recent Outpatient Visits          1 month ago COPD with asthma [facility])   [facility] Steele Sizer, MD   1 month ago COPD with asthma [facility])   [facility] Steele Sizer, MD   3 months ago Dyspnea, unspecified type   [facility] Delsa Grana, PA-C   4 months ago Senile purpura [facility])   [facility] Steele Sizer, MD   10 months ago COVID-19   [facility] Steele Sizer, MD      Future Appointments            In 1 month Ancil Boozer, Drue Stager, MD [facility], [place]   In 5 months Ralene Bathe, MD [place]   In 6 months  [facility], [employer] [facility]

## 2021-05-24 NOTE — Telephone Encounter (Signed)
Medication Refill - Medication: Gabapentin   Has the patient contacted their pharmacy? Yes.   PT has been advised that he has no refills on this medication. Please advise.  (Agent: If no, request that the patient contact the pharmacy for the refill. If patient does not wish to contact the pharmacy document the reason why and proceed with request.) (Agent: If yes, when and what did the pharmacy advise?)  Preferred Pharmacy (with phone number or street name):  Has the patient been see [facility] [place] [facility] [place]  [place] [address]  Phone: [phone] Fax: [phone]  Hours: Not open 24 hours  n for an appointment in the last year OR does the patient have an upcoming appointment? Yes.    Agent: Please be advised that RX refills may take up to 3 business days. We ask that you follow-up with your pharmacy.

## 2021-05-25 ENCOUNTER — Ambulatory Visit (INDEPENDENT_AMBULATORY_CARE_PROVIDER_SITE_OTHER): Payer: Medicare Other

## 2021-05-25 ENCOUNTER — Other Ambulatory Visit: Payer: Self-pay

## 2021-05-25 DIAGNOSIS — Z23 Encounter for immunization: Secondary | ICD-10-CM | POA: Diagnosis not present

## 2021-05-25 NOTE — Progress Notes (Signed)
Patient tolerated vaccine well with no adverse reaction immediatly noticed. Patient was advised and agreed to contact us or report to the nearest ED with any questions or concerns.

## 2021-06-08 ENCOUNTER — Ambulatory Visit: Payer: Medicare Other

## 2021-06-08 ENCOUNTER — Other Ambulatory Visit: Payer: Self-pay

## 2021-06-08 DIAGNOSIS — L570 Actinic keratosis: Secondary | ICD-10-CM | POA: Diagnosis not present

## 2021-06-08 MED ORDER — AMINOLEVULINIC ACID HCL 20 % EX SOLR
1.0000 "application " | Freq: Once | CUTANEOUS | Status: AC
  Administered 2021-06-08: 354 mg via TOPICAL

## 2021-06-08 NOTE — Patient Instructions (Signed)

## 2021-06-08 NOTE — Progress Notes (Signed)
Patient completed PDT therapy today.  1. AK (actinic keratosis) Scalp  Photodynamic therapy - Scalp Procedure discussed: discussed risks, benefits, side effects. and alternatives   Prep: site scrubbed/prepped with acetone   Location:  Scalp Number of lesions:  Multiple Type of treatment:  Blue light Aminolevulinic Acid (see MAR for details): Levulan Number of Levulan sticks used:  1 Incubation time (minutes):  120 Number of minutes under lamp:  16 Number of seconds under lamp:  40 Cooling:  Floor fan Outcome: patient tolerated procedure well with no complications   Post-procedure details: sunscreen applied     

## 2021-06-10 IMAGING — CR DG CHEST 2V
1 series · 2 of 2 positions shown · non-contrast
Comparison: 02/01/2018

CLINICAL DATA: COVID positive.

EXAM:
CHEST - 2 VIEW

[Series 1: w chest pa · 0.14mm/px · 2 of 2 slices shown]
[im 1/2]
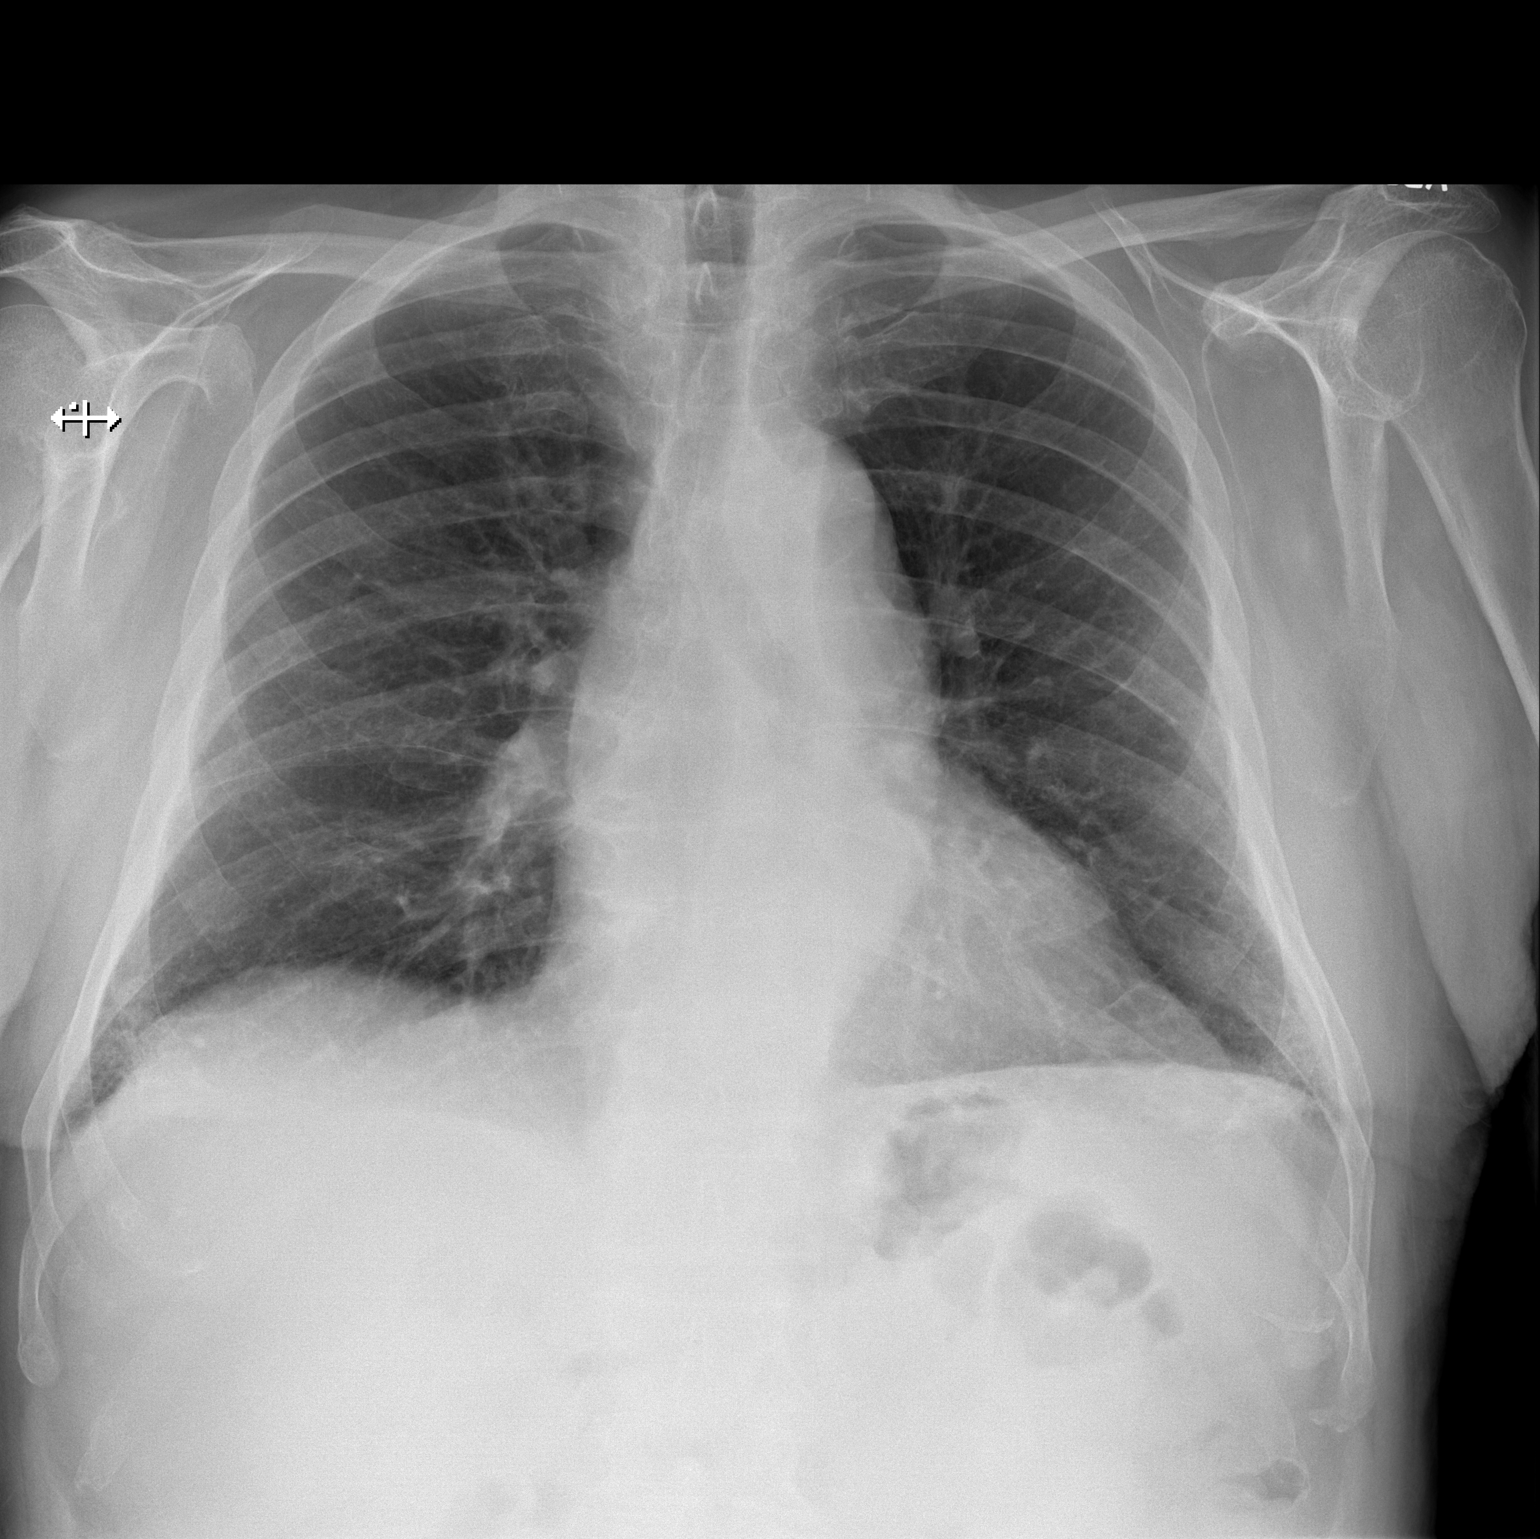
[im 2/2]
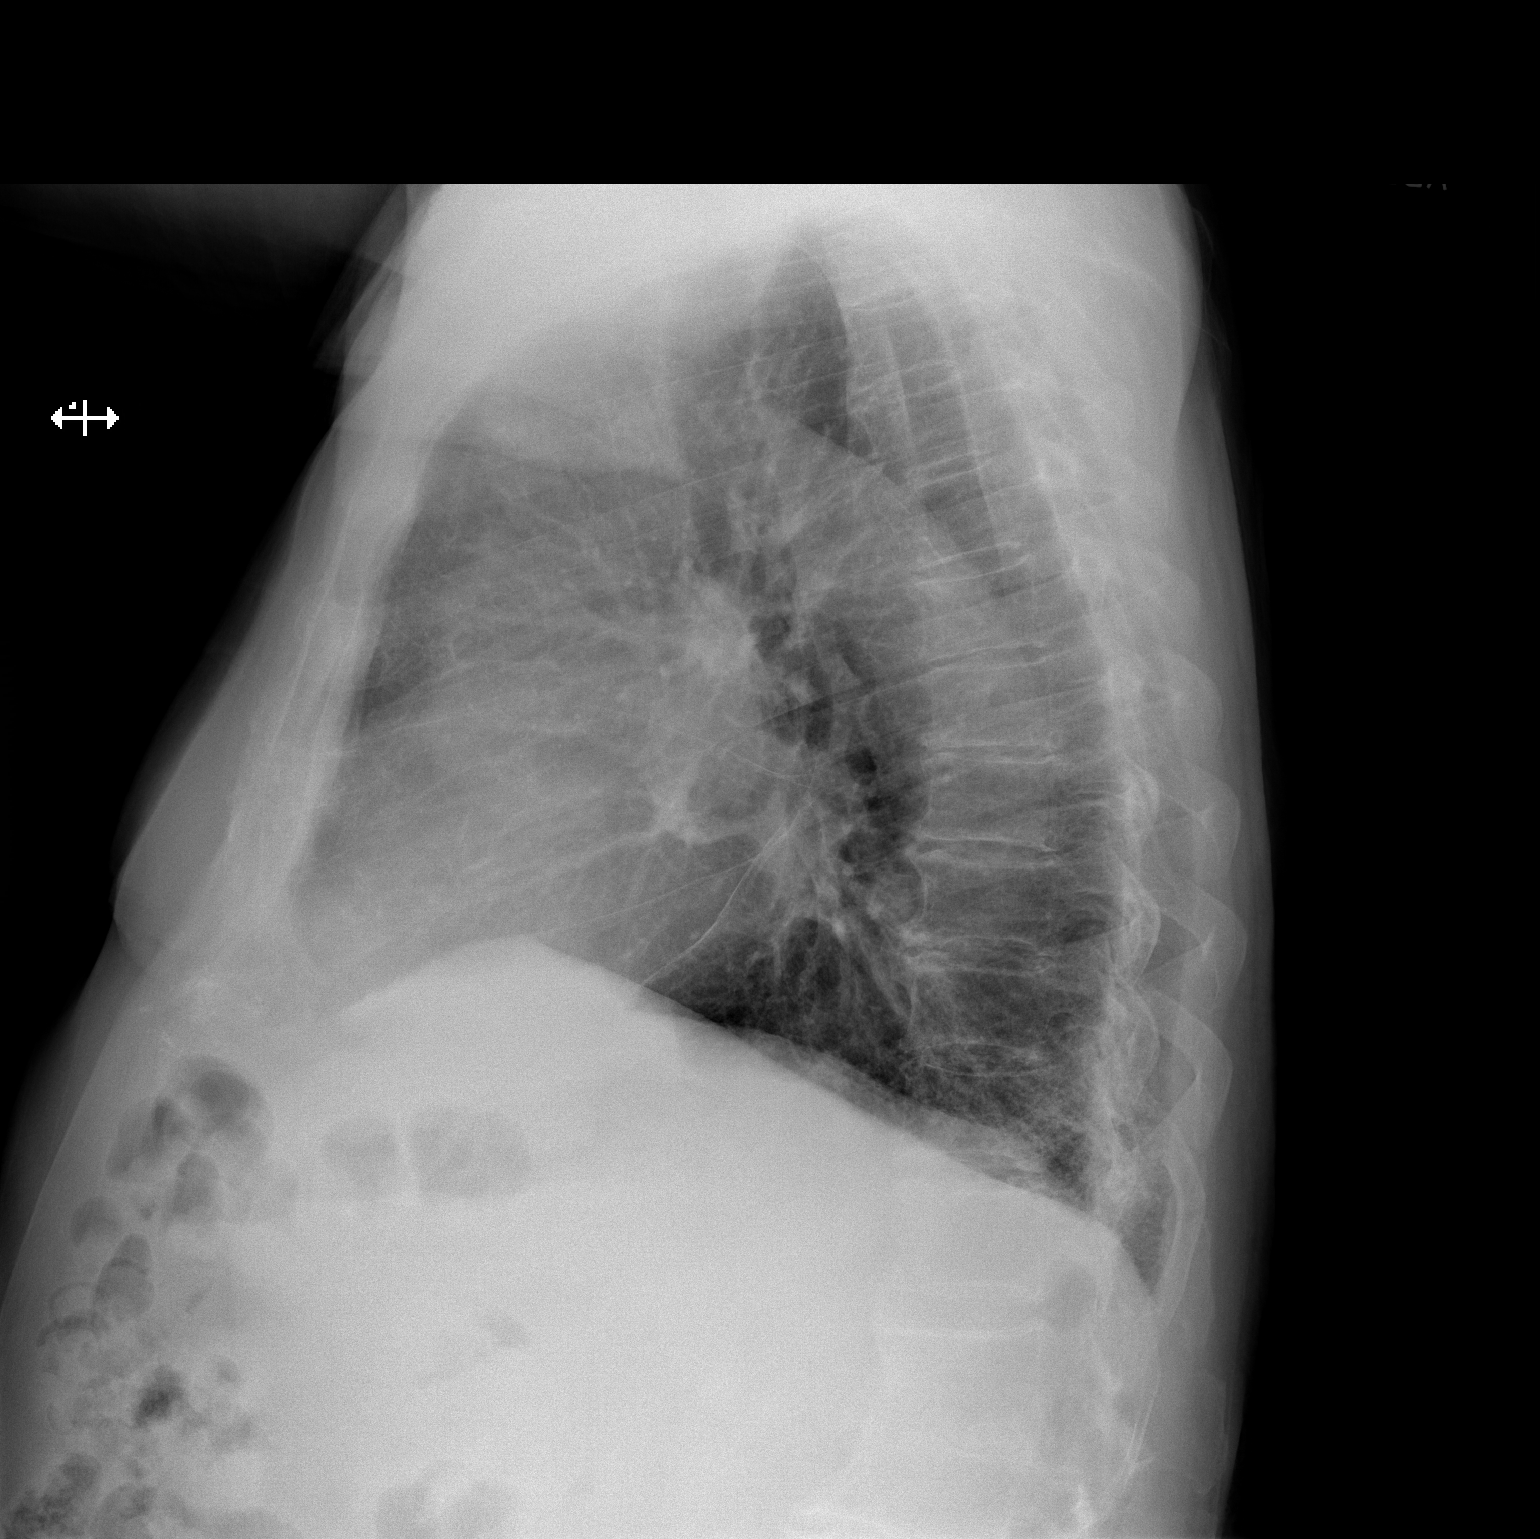

[2 of 2 positions shown; findings below may reference images not displayed]

FINDINGS: Lungs are mildly hyperexpanded. There is some interstitial and
patchy airspace opacity in the costophrenic angles bilaterally,
stable since prior study compatible with scarring. No focal dense
airspace consolidation. The visualized bony structures of the thorax
are intact.
IMPRESSION: Stable scarring in the posterior and peripheral lung bases. No acute
cardiopulmonary findings.

## 2021-06-13 ENCOUNTER — Other Ambulatory Visit: Payer: Self-pay | Admitting: Family Medicine

## 2021-07-03 NOTE — Progress Notes (Signed)
Name: Mark [name]   MRN: [id number]    DOB: 04-15-1929   Date:07/04/2021       Progress Note  Subjective  Chief Complaint  Follow Up  HPI  CHF: he is under the care of Dr. Nehemiah Massed. He had syncope earlier in 2021 seen by Dr. Scarlette Ar,. He was at [facility] living for 6 months, but moved back June 1st 2020 because he likes doing things around the house. He has SOB with moderate activity ( like going up the stairs), he denies orthopnea . He status post STEMI and PCI with stent to mid LAD in 2005 . He had repeat Echo and Stress Myoview, previous Echo was 40 % and is down to 20-30 %. He went to [facility] for increase in SOB early May, was given Jardiance and lasix , however Jardiance was too expensive, he is now on 40 mg of lasix instead of 20 mg and is stable.    Last Echo was done 10/13/2019    INTERPRETATION SEVERE LV SYSTOLIC DYSFUNCTION (See above)   WITH MILD LVH NORMAL RIGHT VENTRICULAR SYSTOLIC FUNCTION MILD VALVULAR REGURGITATION (See above) NO VALVULAR STENOSIS MILD AR, MR, TR EF 25-30%   Last Myoview stress test 10/13/2019  Regional wall motion:  demonstrates  akinesis of the Apical and distal  inferior myocardium. The overall quality of the study is good.   Artifacts noted: yes  Left ventricular cavity: enlarged.  Perfusion Analysis:  SPECT images demonstrate large perfusion abnormality  of severe intensity is present in the apical myocardial region.  There is  a moderate perfusion abnormality of moderate intensity of the inferior  myocardial region on the stress images.  Resting images show unchanged  abnormal perfusion abnormalities of stress portion.  This suggests no  evidence of myocardial ischemia and previous myocardial infarction.   Defect type:  Fixed     GERD: he stopped taking pantoprazole on his own, denies heartburn or indigestion.    BPH: doing well on Flomax, he states no longer having to void at night. Stable    Lung nodules/pulmonary  fibrosis/COPD /Asthma: seen by Dr. Mortimer Fries. He states he does not want repeat studies, last study was 01/2019. CT chest done 2019 showed some pulmonary fibrosis, he used to smoke but not currently , he quit smoking over 50 years ago . He had two COPD / asthma flares this Summer, he thinks secondary to his son's dog. He did well on Brezti but has been out now and has noticed some cough and sob since he stopped. He also wheezes when around strong scents    AR: He stopped using the nasal spray, but taking singulair.    Atherosclerosis aorta/Aneurysm of thoracic aorta: discussed results with patient, he is not interested in changing management , also discussed aneurysm of aorta, guidelines recommends repeat US in 2024 . He is still taking Atorvastatin , he is under the care of Dr. Erven Colla, he sees vascular surgeon once a year    History of skin cancer: under the care of Dr. Erin Fulling, no new lesions. Unchanged    DDD lumbar spine and radiculitis: he has numbness and tingling on right leg only and it affects his walk at times, occasionally numbness on left lower leg. He has been off gabapentin and has noticed worsening pain on left lower leg at night / she states it is a constant , intense pain, unable to describe only at night, right lower leg is always numb or tingling. He would like to  resume gabapentin, he was on 400 mg tid, he is now down to 100 mg TID.   OA: both knees, left shoulder and hands, needs refill of Voltaren gel, stable  Malnutrition: he has lost 15 lbs, advised him to come in sooner if continues to lose weight.   Neck rash: seen by Dr. Nehemiah Massed and was given ketoconazole but he is not sure if he has been using it, we will write it down on his clinical summary for him to pick it up  Right fourth toe: he denies any trauma, he noticed a burning sensation on the toe about 2 weeks ago, when he looked down his toe was purple and it has been purple since. He has neuropathy and usually has tingling  on both legs, but the toe is more like a burning sensation.   Patient Active Problem List   Diagnosis Date Noted   HFrEF (heart failure with reduced ejection fraction) ([place]) 12/02/2020   Cardiac syncope 09/22/2019   Respiratory tract infection due to COVID-19 virus 09/04/2019   Fibrosis of lung ([place]) 09/29/2018   Senile purpura ([place]) 09/29/2018   Atherosclerosis of aorta ([place]) 09/29/2018   Hardening of the aorta (main artery of the heart) ([place]) 09/29/2018   Abnormal ECG 06/16/2018   Thoracic aortic aneurysm without rupture 05/28/2018   CHF (congestive heart failure), NYHA class III, chronic, systolic ([place]) 73/53/2992   BPH with obstruction/lower urinary tract symptoms 02/26/2018   Ischemic cardiomyopathy 06/11/2017   Benign essential HTN 05/22/2017   Bilateral carotid artery stenosis 05/22/2017   Neuropathy 01/28/2017   Right leg paresthesias 11/26/2016   Hypertension 08/27/2016   GERD (gastroesophageal reflux disease) 08/27/2016   Hyperglycemia 05/10/2016   History of nonmelanoma skin cancer 10/10/2015   Pain in right shoulder 09/13/2015   Osteoarthritis 07/06/2015   Lymphedema 05/30/2015   Other specified soft tissue disorders 05/30/2015   Hyperlipidemia 03/29/2015   Family history of type 2 diabetes mellitus in father 03/29/2015   DD (diverticular disease) 02/03/2015   Diverticulosis of intestine without perforation or abscess without bleeding 02/03/2015   DDD (degenerative disc disease), lumbar 03/03/2014   Intervertebral disc disorder with radiculopathy of lumbar region 03/03/2014   Lumbar radiculitis 03/03/2014   Lumbar stenosis with neurogenic claudication 03/03/2014   Arteriosclerosis of coronary artery 04/18/2012   S/P coronary artery stent placement 04/18/2012   Status post percutaneous transluminal coronary angioplasty 04/18/2012    Past Surgical History:  Procedure Laterality Date   CATARACT EXTRACTION W/PHACO Right 11/24/2019   Procedure: CATARACT EXTRACTION  PHACO AND INTRAOCULAR LENS PLACEMENT (IOC) RIGHT MALYUGIN 8.59 00:50.8;  Surgeon: Birder Robson, MD;  Location: [place];  Service: Ophthalmology;  Laterality: Right;  COVID ( + ) 09-01-19   CATARACT EXTRACTION W/PHACO Left 12/15/2019   Procedure: CATARACT EXTRACTION PHACO AND INTRAOCULAR LENS PLACEMENT (IOC) LEFT 9.90  01:03.7;  Surgeon: Birder Robson, MD;  Location: [place];  Service: Ophthalmology;  Laterality: Left;   CORONARY ANGIOPLASTY WITH STENT PLACEMENT  2005   [facility]    Family History  Problem Relation Age of Onset   Hypertension Mother    Heart disease Mother    Heart disease Father    Hypertension Father     Social History   Tobacco Use   Smoking status: Former    Types: Cigarettes    Quit date: 05/16/1963    Years since quitting: 58.1   Smokeless tobacco: Never   Tobacco comments:    quit over 50 years ago  Substance Use  Topics   Alcohol use: No     Current Outpatient Medications:    acetaminophen (TYLENOL) 500 MG tablet, Take 500 mg by mouth every 6 (six) hours as needed., Disp: , Rfl:    albuterol (VENTOLIN HFA) 108 (90 Base) MCG/ACT inhaler, USE 2 INHALATIONS BY MOUTH  EVERY 6 HOURS AS NEEDED FOR WHEEZING OR SHORTNESS OF  BREATH, Disp: 18 g, Rfl: 1   aspirin EC 81 MG tablet, Take 81 mg by mouth daily. , Disp: , Rfl:    atorvastatin (LIPITOR) 10 MG tablet, Take 1 tablet (10 mg total) by mouth daily., Disp: 90 tablet, Rfl: 1   diclofenac Sodium (VOLTAREN) 1 % GEL, APPLY 4 GRAMS TOPICALLY 4  TIMES DAILY, Disp: 600 g, Rfl: 0   gabapentin (NEURONTIN) 100 MG capsule, TAKE 1 TO 3 CAPSULES BY  MOUTH 3 TIMES DAILY, Disp: 90 capsule, Rfl: 0   montelukast (SINGULAIR) 10 MG tablet, Take 1 tablet (10 mg total) by mouth at bedtime., Disp: 90 tablet, Rfl: 1   potassium chloride SA (KLOR-CON) 20 MEQ tablet, TAKE 1 TABLET BY MOUTH  DAILY, Disp: 90 tablet, Rfl: 3   Probiotic Product (PROBIOTIC PO), Take by mouth daily. , Disp: , Rfl:    tamsulosin  (FLOMAX) 0.4 MG CAPS capsule, Take 1 capsule (0.4 mg total) by mouth daily., Disp: 90 capsule, Rfl: 3   azelastine (ASTELIN) 0.1 % nasal spray, Place 1 spray into both nostrils 2 (two) times daily. Use in each nostril as directed (Patient not taking: Reported on 07/04/2021), Disp: 30 mL, Rfl: 2   benzonatate (TESSALON) 100 MG capsule, Take 1-2 capsules (100-200 mg total) by mouth 2 (two) times daily as needed. (Patient not taking: Reported on 07/04/2021), Disp: 60 capsule, Rfl: 0   Budeson-Glycopyrrol-Formoterol (BREZTRI AEROSPHERE) 160-9-4.8 MCG/ACT AERO, Inhale 2 puffs into the lungs 2 (two) times daily. (Patient not taking: Reported on 07/04/2021), Disp: 10.7 g, Rfl: 2   furosemide (LASIX) 20 MG tablet, TAKE 1 TABLET BY MOUTH  DAILY (Patient not taking: Reported on 07/04/2021), Disp: 90 tablet, Rfl: 3   JARDIANCE 10 MG TABS tablet, TAKE 1 TABLET BY MOUTH  DAILY (Patient not taking: Reported on 07/04/2021), Disp: 90 tablet, Rfl: 3   Loratadine 10 MG CAPS, Take 10 mg by mouth daily.  (Patient not taking: Reported on 07/04/2021), Disp: , Rfl:    pantoprazole (PROTONIX) 40 MG tablet, Take 1 tablet (40 mg total) by mouth daily. (Patient not taking: Reported on 07/04/2021), Disp: 90 tablet, Rfl: 0   predniSONE (STERAPRED UNI-PAK 21 TAB) 10 MG (21) TBPK tablet, Take as directed (Patient not taking: Reported on 07/04/2021), Disp: 21 tablet, Rfl: 0  Allergies  Allergen Reactions   Gramineae Pollens Itching and Shortness Of Breath    Sneezing   Cat Hair Extract Itching    Watery eyes and sneezing   Breo Ellipta [Fluticasone Furoate-Vilanterol] Rash    Patient stated that it made his mouth break out   Rosuvastatin Rash and Other (See Comments)    No reaction per pt, He saw some information regarding Crestor and adverse effects     I personally reviewed active problem list, medication list, allergies, family history, social history, health maintenance with the patient/caregiver  today.   ROS  Constitutional: Negative for fever or weight change.  Respiratory: Negative for cough and shortness of breath.   Cardiovascular: Negative for chest pain or palpitations.  Gastrointestinal: Negative for abdominal pain, no bowel changes.  Musculoskeletal: Negative for gait problem or joint swelling.  Skin: Negative for rash.  Neurological: Negative for dizziness or headache.  No other specific complaints in a complete review of systems (except as listed in HPI above).   Objective  Vitals:   07/04/21 0952  BP: 118/62  Pulse: 91  Resp: 16  Temp: 98 F (36.7 C)  SpO2: 99%  Weight: 188 lb (85.3 kg)  Height: 6' (1.829 m)    Body mass index is 25.5 kg/m.  Physical Exam  Constitutional: Patient appears well-developed and well-nourished.  No distress.  HEENT: head atraumatic, normocephalic, pupils equal and reactive to light, neck supple Cardiovascular: Normal rate, regular rhythm and normal heart sounds.  No murmur heard. Trace  BLE edema. Right 4th toe is purple, no pain, distal pulses weak but worse on right side  Pulmonary/Chest: Effort normal and coarse breath sounds  No respiratory distress. Abdominal: Soft.  There is no tenderness. Muscular skeletal: hammer toes both feet  Psychiatric: Patient has a normal mood and affect. behavior is normal. Judgment and thought content normal.    PHQ2/9: Depression screen [facility] 2/9 07/04/2021 04/07/2021 03/29/2021 02/13/2021 12/30/2020  Decreased Interest 0 0 0 0 2  Down, Depressed, Hopeless 0 0 0 0 0  PHQ - 2 Score 0 0 0 0 2  Altered sleeping 0 - - 0 0  Tired, decreased energy 0 - - 0 3  Change in appetite 0 - - 0 0  Feeling bad or failure about yourself  0 - - 0 0  Trouble concentrating 0 - - 0 0  Moving slowly or fidgety/restless 0 - - 0 0  Suicidal thoughts 0 - - 0 0  PHQ-9 Score 0 - - 0 5  Difficult doing work/chores - - - Not difficult at all -  Some recent data might be hidden    phq 9 is negative   Fall  Risk: Fall Risk  07/04/2021 04/07/2021 03/29/2021 02/13/2021 12/30/2020  Falls in the past year? 0 0 0 1 1  Number falls in past yr: 0 0 0 1 1  Injury with Fall? 0 0 0 1 1  Risk for fall due to : No Fall Risks - - - -  Follow up Falls prevention discussed Falls prevention discussed - - -      Functional Status Survey: Is the patient deaf or have difficulty hearing?: No Does the patient have difficulty seeing, even when wearing glasses/contacts?: No Does the patient have difficulty concentrating, remembering, or making decisions?: No Does the patient have difficulty walking or climbing stairs?: Yes Does the patient have difficulty dressing or bathing?: No Does the patient have difficulty doing errands alone such as visiting a doctor's office or shopping?: No    Assessment & Plan  1. Tortuous aorta (HCC)  - atorvastatin (LIPITOR) 10 MG tablet; Take 1 tablet (10 mg total) by mouth daily.  Dispense: 90 tablet; Refill: 1  2. COPD with asthma ([place])  - Budeson-Glycopyrrol-Formoterol (BREZTRI AEROSPHERE) 160-9-4.8 MCG/ACT AERO; Inhale 2 puffs into the lungs 2 (two) times daily.  Dispense: 10.7 g; Refill: 2  3. Fibrosis of lung ([place])  - Budeson-Glycopyrrol-Formoterol (BREZTRI AEROSPHERE) 160-9-4.8 MCG/ACT AERO; Inhale 2 puffs into the lungs 2 (two) times daily.  Dispense: 10.7 g; Refill: 2  4. Senile purpura ([place])   5. CHF (congestive heart failure), NYHA class III, chronic, systolic (HCC)   6. Peripheral polyneuropathy  - gabapentin (NEURONTIN) 100 MG capsule; Take 1 capsule (100 mg total) by mouth 3 (three) times daily.  Dispense: 90 capsule; Refill: 2  7. Essential hypertension   8. Atherosclerosis of aorta (HCC)  - atorvastatin (LIPITOR) 10 MG tablet; Take 1 tablet (10 mg total) by mouth daily.  Dispense: 90 tablet; Refill: 1  9. Gastroesophageal reflux disease without esophagitis   10. Primary osteoarthritis of both knees   11. BPH with obstruction/lower urinary tract  symptoms   12. Decreased pedal pulses  - Ambulatory referral to Vascular Surgery  13. Toe pain, right  - Ambulatory referral to Vascular Surgery  14. Hammer toes, bilateral   15. Pure hypercholesterolemia  - atorvastatin (LIPITOR) 10 MG tablet; Take 1 tablet (10 mg total) by mouth daily.  Dispense: 90 tablet; Refill: 1  16. Perennial allergic rhinitis  - montelukast (SINGULAIR) 10 MG tablet; Take 1 tablet (10 mg total) by mouth at bedtime.  Dispense: 90 tablet; Refill: 1

## 2021-07-04 ENCOUNTER — Encounter: Payer: Self-pay | Admitting: Family Medicine

## 2021-07-04 ENCOUNTER — Other Ambulatory Visit: Payer: Self-pay

## 2021-07-04 ENCOUNTER — Ambulatory Visit (INDEPENDENT_AMBULATORY_CARE_PROVIDER_SITE_OTHER): Payer: Medicare Other | Admitting: Family Medicine

## 2021-07-04 VITALS — BP 118/62 | HR 91 | Temp 98.0°F | Resp 16 | Ht 72.0 in | Wt 188.0 lb

## 2021-07-04 DIAGNOSIS — N401 Enlarged prostate with lower urinary tract symptoms: Secondary | ICD-10-CM

## 2021-07-04 DIAGNOSIS — I5022 Chronic systolic (congestive) heart failure: Secondary | ICD-10-CM | POA: Diagnosis not present

## 2021-07-04 DIAGNOSIS — I10 Essential (primary) hypertension: Secondary | ICD-10-CM

## 2021-07-04 DIAGNOSIS — M2042 Other hammer toe(s) (acquired), left foot: Secondary | ICD-10-CM

## 2021-07-04 DIAGNOSIS — J449 Chronic obstructive pulmonary disease, unspecified: Secondary | ICD-10-CM | POA: Diagnosis not present

## 2021-07-04 DIAGNOSIS — E7800 Pure hypercholesterolemia, unspecified: Secondary | ICD-10-CM

## 2021-07-04 DIAGNOSIS — J8410 Pulmonary fibrosis, unspecified: Secondary | ICD-10-CM

## 2021-07-04 DIAGNOSIS — M170 Bilateral primary osteoarthritis of knee: Secondary | ICD-10-CM

## 2021-07-04 DIAGNOSIS — G629 Polyneuropathy, unspecified: Secondary | ICD-10-CM | POA: Diagnosis not present

## 2021-07-04 DIAGNOSIS — M79674 Pain in right toe(s): Secondary | ICD-10-CM

## 2021-07-04 DIAGNOSIS — D692 Other nonthrombocytopenic purpura: Secondary | ICD-10-CM | POA: Diagnosis not present

## 2021-07-04 DIAGNOSIS — M2041 Other hammer toe(s) (acquired), right foot: Secondary | ICD-10-CM

## 2021-07-04 DIAGNOSIS — R0989 Other specified symptoms and signs involving the circulatory and respiratory systems: Secondary | ICD-10-CM | POA: Diagnosis not present

## 2021-07-04 DIAGNOSIS — I700 Atherosclerosis of aorta: Secondary | ICD-10-CM

## 2021-07-04 DIAGNOSIS — K219 Gastro-esophageal reflux disease without esophagitis: Secondary | ICD-10-CM

## 2021-07-04 DIAGNOSIS — N138 Other obstructive and reflux uropathy: Secondary | ICD-10-CM

## 2021-07-04 DIAGNOSIS — J3089 Other allergic rhinitis: Secondary | ICD-10-CM

## 2021-07-04 DIAGNOSIS — I771 Stricture of artery: Secondary | ICD-10-CM | POA: Diagnosis not present

## 2021-07-04 MED ORDER — BREZTRI AEROSPHERE 160-9-4.8 MCG/ACT IN AERO: 2.0000 | INHALATION_SPRAY | Freq: Two times a day (BID) | RESPIRATORY_TRACT | 2 refills | Status: DC

## 2021-07-04 MED ORDER — GABAPENTIN 100 MG PO CAPS: 100.0000 mg | ORAL_CAPSULE | Freq: Three times a day (TID) | ORAL | 2 refills | Status: DC

## 2021-07-04 MED ORDER — ATORVASTATIN CALCIUM 10 MG PO TABS: 10.0000 mg | ORAL_TABLET | Freq: Every day | ORAL | 1 refills | Status: AC

## 2021-07-04 MED ORDER — MONTELUKAST SODIUM 10 MG PO TABS: 10.0000 mg | ORAL_TABLET | Freq: Every day | ORAL | 1 refills | Status: AC

## 2021-07-04 NOTE — Patient Instructions (Signed)
Ketoconazole for neck rash

## 2021-07-06 ENCOUNTER — Other Ambulatory Visit: Payer: Self-pay | Admitting: Family Medicine

## 2021-07-07 NOTE — Telephone Encounter (Signed)
Requested Prescriptions  Pending Prescriptions Disp Refills  . tamsulosin (FLOMAX) 0.4 MG CAPS capsule [Pharmacy Med Name: Tamsulosin HCl 0.4 MG Oral Capsule] 90 capsule 3    Sig: TAKE 1 CAPSULE BY MOUTH  DAILY     Urology: Alpha-Adrenergic Blocker Passed - 07/06/2021 10:13 PM      Passed - Last BP in normal range    BP Readings from Last 1 Encounters:  07/04/21 118/62         Passed - Valid encounter within last 12 months    Recent Outpatient Visits          3 days ago Tortuous aorta [facility])   [facility] Steele Sizer, MD   3 months ago COPD with asthma [facility])   [facility] Steele Sizer, MD   3 months ago COPD with asthma [facility])   [facility] Steele Sizer, MD   4 months ago Dyspnea, unspecified type   [facility] Delsa Grana, PA-C   6 months ago Senile purpura [facility])   [facility] Steele Sizer, MD      Future Appointments            In 4 months Ralene Bathe, MD [place]   In 4 months  [facility], [place]   In 6 months Steele Sizer, MD [facility]

## 2021-07-11 ENCOUNTER — Other Ambulatory Visit: Payer: Self-pay

## 2021-07-11 MED ORDER — ALBUTEROL SULFATE HFA 108 (90 BASE) MCG/ACT IN AERS
INHALATION_SPRAY | RESPIRATORY_TRACT | 0 refills | Status: DC
Start: 2021-07-11 — End: 2021-07-28

## 2021-07-14 ENCOUNTER — Ambulatory Visit (INDEPENDENT_AMBULATORY_CARE_PROVIDER_SITE_OTHER): Payer: Medicare Other

## 2021-07-14 ENCOUNTER — Ambulatory Visit
Admission: EM | Admit: 2021-07-14 | Discharge: 2021-07-14 | Disposition: A | Discharge status: Home / Self Care | Payer: Medicare Other | Attending: Medical Oncology | Admitting: Medical Oncology

## 2021-07-14 ENCOUNTER — Other Ambulatory Visit: Payer: Self-pay

## 2021-07-14 DIAGNOSIS — Z20822 Contact with and (suspected) exposure to covid-19: Secondary | ICD-10-CM | POA: Insufficient documentation

## 2021-07-14 DIAGNOSIS — J8410 Pulmonary fibrosis, unspecified: Secondary | ICD-10-CM | POA: Diagnosis not present

## 2021-07-14 DIAGNOSIS — R0602 Shortness of breath: Secondary | ICD-10-CM | POA: Diagnosis not present

## 2021-07-14 DIAGNOSIS — Z9109 Other allergy status, other than to drugs and biological substances: Secondary | ICD-10-CM | POA: Insufficient documentation

## 2021-07-14 DIAGNOSIS — I454 Nonspecific intraventricular block: Secondary | ICD-10-CM | POA: Insufficient documentation

## 2021-07-14 LAB — CBC
HCT: 45.9 % (ref 39.0–52.0)
Hemoglobin: 14.7 g/dL (ref 13.0–17.0)
MCH: 31.5 pg (ref 26.0–34.0)
MCHC: 32 g/dL (ref 30.0–36.0)
MCV: 98.3 fL (ref 80.0–100.0)
Platelets: 235 10*3/uL (ref 150–400)
RBC: 4.67 MIL/uL (ref 4.22–5.81)
RDW: 12.7 % (ref 11.5–15.5)
WBC: 7 10*3/uL (ref 4.0–10.5)
nRBC: 0 % (ref 0.0–0.2)

## 2021-07-14 LAB — BASIC METABOLIC PANEL
Anion gap: 7 (ref 5–15)
BUN: 14 mg/dL (ref 8–23)
CO2: 26 mmol/L (ref 22–32)
Calcium: 8.9 mg/dL (ref 8.9–10.3)
Chloride: 101 mmol/L (ref 98–111)
Creatinine, Ser: 0.84 mg/dL (ref 0.61–1.24)
GFR, Estimated: >60 mL/min (ref >60)
Glucose, Bld: 97 mg/dL (ref 70–99)
Potassium: 4.1 mmol/L (ref 3.5–5.1)
Sodium: 134 mmol/L — ABNORMAL LOW (ref 135–145)

## 2021-07-14 LAB — RESP PANEL BY RT-PCR (FLU A&B, COVID) ARPGX2
Influenza A by PCR: NEGATIVE
Influenza B by PCR: NEGATIVE
SARS Coronavirus 2 by RT PCR: NEGATIVE

## 2021-07-14 LAB — BRAIN NATRIURETIC PEPTIDE: B Natriuretic Peptide: 407 pg/mL — ABNORMAL HIGH (ref 0.0–100.0)

## 2021-07-14 MED ORDER — PREDNISONE 10 MG PO TABS: ORAL_TABLET | ORAL | 0 refills | Status: DC

## 2021-07-14 NOTE — ED Provider Notes (Signed)
[facility]    CSN: [id number] Arrival date & time: 07/14/21  1050      History   Chief Complaint Chief Complaint  Patient presents with   Shortness of Breath    HPI Mark [name] is a 87 y.o. male.   HPI  SOB: Patient reports that he has chronic shortness of breath.  He reports that he also has an allergy to cats and dogs.  He has been staying with a dog for the past week and has noticed progressive shortness of breath.  He states that he feels like this is his normal shortness of breath and this event.  In the past he has been given high-dose prednisone which has been beneficial for his symptoms.  He denies any fever, chest pain, peripheral edema, fatigue.  He does have a complex medical history and is 87 years old.  He has tried inhalers for symptoms 1 time but did not notice significant improvement.  Past Medical History:  Diagnosis Date   Actinic keratosis 11/20/2018   right proximal lateral bicep.   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 Deep margin involved. 2nd Excision 12/23/2018, no residual lesion, margins free   Carotid artery disease ([place])    Coronary artery disease 2005   COVID-19 09/01/2019   symptoms resolved 09/05/19   GERD (gastroesophageal reflux disease)    Hyperkalemia    Hyperlipidemia    Hypertension    Past heart attack 2005   Squamous cell carcinoma of skin 08/21/2018   Left distal dorsum forearm. WD [facility]. [facility]    Patient Active Problem List   Diagnosis Date Noted   HFrEF (heart failure with reduced ejection fraction) ([place]) 12/02/2020   Cardiac syncope 09/22/2019   Respiratory tract infection due to COVID-19 virus 09/04/2019   Fibrosis of lung ([place]) 09/29/2018   Senile purpura ([place]) 09/29/2018   Atherosclerosis of aorta ([place]) 09/29/2018   Hardening of the aorta (main artery of the heart) ([place]) 09/29/2018   Abnormal ECG 06/16/2018   Thoracic aortic aneurysm without rupture 05/28/2018    CHF (congestive heart failure), NYHA class III, chronic, systolic ([place]) 40/98/1191   BPH with obstruction/lower urinary tract symptoms 02/26/2018   Ischemic cardiomyopathy 06/11/2017   Benign essential HTN 05/22/2017   Bilateral carotid artery stenosis 05/22/2017   Neuropathy 01/28/2017   Right leg paresthesias 11/26/2016   Hypertension 08/27/2016   GERD (gastroesophageal reflux disease) 08/27/2016   Hyperglycemia 05/10/2016   History of nonmelanoma skin cancer 10/10/2015   Pain in right shoulder 09/13/2015   Osteoarthritis 07/06/2015   Lymphedema 05/30/2015   Other specified soft tissue disorders 05/30/2015   Hyperlipidemia 03/29/2015   Family history of type 2 diabetes mellitus in father 03/29/2015   DD (diverticular disease) 02/03/2015   Diverticulosis of intestine without perforation or abscess without bleeding 02/03/2015   DDD (degenerative disc disease), lumbar 03/03/2014   Intervertebral disc disorder with radiculopathy of lumbar region 03/03/2014   Lumbar radiculitis 03/03/2014   Lumbar stenosis with neurogenic claudication 03/03/2014   Arteriosclerosis of coronary artery 04/18/2012   S/P coronary artery stent placement 04/18/2012   Status post percutaneous transluminal coronary angioplasty 04/18/2012    Past Surgical History:  Procedure Laterality Date   CATARACT EXTRACTION W/PHACO Right 11/24/2019   Procedure: CATARACT EXTRACTION PHACO AND INTRAOCULAR LENS PLACEMENT (IOC) RIGHT MALYUGIN 8.59 00:50.8;  Surgeon: Birder Robson, MD;  Location: [place];  Service: Ophthalmology;  Laterality: Right;  COVID ( + )  09-01-19   CATARACT EXTRACTION W/PHACO Left 12/15/2019   Procedure: CATARACT EXTRACTION PHACO AND INTRAOCULAR LENS PLACEMENT (IOC) LEFT 9.90  01:03.7;  Surgeon: Birder Robson, MD;  Location: [place];  Service: Ophthalmology;  Laterality: Left;   CORONARY ANGIOPLASTY WITH STENT PLACEMENT  2005   [facility]       Home Medications    Prior to  Admission medications   Medication Sig Start Date End Date Taking? Authorizing Provider  acetaminophen (TYLENOL) 500 MG tablet Take 500 mg by mouth every 6 (six) hours as needed.    [provider]  albuterol (VENTOLIN HFA) 108 (90 Base) MCG/ACT inhaler USE 2 INHALATIONS BY MOUTH  EVERY 6 HOURS AS NEEDED FOR WHEEZING OR SHORTNESS OF  BREATH 07/11/21   Steele Sizer, MD  aspirin EC 81 MG tablet Take 81 mg by mouth daily.     [provider]  atorvastatin (LIPITOR) 10 MG tablet Take 1 tablet (10 mg total) by mouth daily. 07/04/21   Steele Sizer, MD  Budeson-Glycopyrrol-Formoterol (BREZTRI AEROSPHERE) 160-9-4.8 MCG/ACT AERO Inhale 2 puffs into the lungs 2 (two) times daily. 07/04/21   Steele Sizer, MD  diclofenac Sodium (VOLTAREN) 1 % GEL APPLY 4 GRAMS TOPICALLY 4  TIMES DAILY 03/30/21   Steele Sizer, MD  furosemide (LASIX) 40 MG tablet Take 40 mg by mouth in the morning.    Corey Skains, MD  gabapentin (NEURONTIN) 100 MG capsule Take 1 capsule (100 mg total) by mouth 3 (three) times daily. 07/04/21   Steele Sizer, MD  Loratadine 10 MG CAPS Take 10 mg by mouth daily.  Patient not taking: Reported on 07/04/2021    [provider]  montelukast (SINGULAIR) 10 MG tablet Take 1 tablet (10 mg total) by mouth at bedtime. 07/04/21   Steele Sizer, MD  potassium chloride SA (KLOR-CON) 20 MEQ tablet TAKE 1 TABLET BY MOUTH  DAILY 05/05/21   Bo Merino, FNP  Probiotic Product (PROBIOTIC PO) Take by mouth daily.     [provider]  tamsulosin (FLOMAX) 0.4 MG CAPS capsule TAKE 1 CAPSULE BY MOUTH  DAILY 07/07/21   Steele Sizer, MD    Family History Family History  Problem Relation Age of Onset   Hypertension Mother    Heart disease Mother    Heart disease Father    Hypertension Father     Social History Social History   Tobacco Use   Smoking status: Former    Types: Cigarettes    Quit date: 05/16/1963    Years since quitting: 58.2   Smokeless  tobacco: Never   Tobacco comments:    quit over 50 years ago  Vaping Use   Vaping Use: Never used  Substance Use Topics   Alcohol use: No   Drug use: No     Allergies   Gramineae pollens, Cat hair extract, Breo ellipta [fluticasone furoate-vilanterol], and Rosuvastatin   Review of Systems Review of Systems  As stated above in HPI Physical Exam Triage Vital Signs ED Triage Vitals [07/14/21 1101]  Enc Vitals Group     BP 103/68     Pulse Rate 75     Resp 18     Temp 97.9 F (36.6 C)     Temp Source Temporal     SpO2 97 %     Weight      Height      Head Circumference      Peak Flow      Pain Score 0  Pain Loc      Pain Edu?      Excl. in [place]?    No data found.  Updated Vital Signs BP 103/68 (BP Location: Right Arm)    Pulse 75    Temp 97.9 F (36.6 C) (Temporal)    Resp 18    SpO2 97%   Physical Exam Vitals and nursing note reviewed.  Constitutional:      General: He is not in acute distress.    Appearance: He is well-developed. He is not ill-appearing, toxic-appearing or diaphoretic.  HENT:     Head: Normocephalic and atraumatic.     Mouth/Throat:     Mouth: Mucous membranes are moist.     Pharynx: Oropharynx is clear. No pharyngeal swelling or oropharyngeal exudate.  Eyes:     Extraocular Movements: Extraocular movements intact.     Pupils: Pupils are equal, round, and reactive to light.  Neck:     Thyroid: No thyromegaly.     Trachea: No tracheal deviation.  Cardiovascular:     Rate and Rhythm: Normal rate and regular rhythm.  Pulmonary:     Effort: Pulmonary effort is normal.     Breath sounds: Normal breath sounds. No decreased breath sounds, wheezing, rhonchi or rales.  Chest:     Chest wall: No mass or tenderness.  Musculoskeletal:     Cervical back: Normal range of motion and neck supple.  Lymphadenopathy:     Cervical: No cervical adenopathy.  Skin:    General: Skin is warm.     Coloration: Skin is not cyanotic or pale.   Neurological:     Mental Status: He is alert and oriented to person, place, and time.     UC Treatments / Results  Labs (all labs ordered are listed, but only abnormal results are displayed) Labs Reviewed  RESP PANEL BY RT-PCR (FLU A&B, COVID) [id number]    EKG   Radiology DG Chest 2 View  Result Date: 07/14/2021 CLINICAL DATA:  Shortness of breath. EXAM: CHEST - 2 VIEW COMPARISON:  Chest x-ray dated Dec 01, 2020. FINDINGS: The heart size and mediastinal contours are within normal limits. Normal pulmonary vascularity. Unchanged mildly increased peripheral and basilar predominant interstitial markings. No focal consolidation, pleural effusion, or pneumothorax. No acute osseous abnormality. IMPRESSION: 1. No acute cardiopulmonary disease. 2. Unchanged chronic interstitial lung disease. Electronically Signed   By: Titus Dubin M.D.   On: 07/14/2021 12:07    Procedures Procedures (including critical care time)  Medications Ordered in UC Medications - No data to display  Initial Impression / Assessment and Plan / UC Course  I have reviewed the triage vital signs and the nursing notes.  Pertinent labs & imaging results that were available during my care of the patient were reviewed by me and considered in my medical decision making (see chart for details).     New.  Could potentially be secondary to allergy to the dog however given his age and comorbidities further work-up was obtained including labs, EKG and chest x-ray.  His EKG and chest x-ray are reassuring against acute abnormalities.  Labs pending.  For now we discussed red flag signs and symptoms and we are going to trial on low-dose prednisone to avoid any complications.  Follow-up as needed. Final Clinical Impressions(s) / UC Diagnoses   Final diagnoses:  None   Discharge Instructions   None    ED Prescriptions   None    PDMP not reviewed this encounter.   Nelwyn Salisbury  M, PA-C 07/14/21 1307

## 2021-07-14 NOTE — ED Triage Notes (Addendum)
Patient presents to Urgent Care with complaints of SOB. He states has a hx of CHF, hx of SOB today is worse, and dog allergy. He states 2 weeks ago he was seen by his PCP and was given nasal spray. He states his son and son's dog has been staying with him with him. He believes his SOB worsened due to the dog and worse today. Last 02 sat reading this morning was 91%.Treating symptoms with nasal spray, mucinex, and albuterol inhaler with no relief.   Denies chest pain or fever.

## 2021-07-17 ENCOUNTER — Ambulatory Visit: Payer: Self-pay | Admitting: *Deleted

## 2021-07-17 NOTE — Progress Notes (Signed)
Name: Mark [name]   MRN: [id number]    DOB: 04/05/1929   Date:07/18/2021       Progress Note  Subjective  Chief Complaint  Cough/ SOB  HPI  Lung nodules/pulmonary fibrosis/COPD /Asthma: seen by Dr. Mortimer Fries. CT chest done 2019 showed some pulmonary fibrosis, he used to smoke  half pack daily for from age 87 to age 87 yo, he quit smoking over 50 years ago . He had two COPD / asthma Since May he has been having recurrent episodes of coughing and increase in SOB.He had multiple rounds of prednisone with temporary relieve of symptoms. He went to urgent care last week and is on day 5 of taper, he states cough is still significant at night, sounds wet but unable to expectorate. Discussed CXR's and CT scans done over the past 4 years with patient and his son. Explained likely progression of the disease. He states better when he sleeps reclined, no lower extremity edema. He denies fever, chills or change in appetite. He has been using Brezti as prescribed.     Patient Active Problem List   Diagnosis Date Noted   HFrEF (heart failure with reduced ejection fraction) ([place]) 12/02/2020   Cardiac syncope 09/22/2019   Respiratory tract infection due to COVID-19 virus 09/04/2019   Fibrosis of lung ([place]) 09/29/2018   Senile purpura ([place]) 09/29/2018   Atherosclerosis of aorta ([place]) 09/29/2018   Hardening of the aorta (main artery of the heart) ([place]) 09/29/2018   Abnormal ECG 06/16/2018   Thoracic aortic aneurysm without rupture 05/28/2018   CHF (congestive heart failure), NYHA class III, chronic, systolic ([place]) 63/78/5885   BPH with obstruction/lower urinary tract symptoms 02/26/2018   Ischemic cardiomyopathy 06/11/2017   Benign essential HTN 05/22/2017   Bilateral carotid artery stenosis 05/22/2017   Neuropathy 01/28/2017   Right leg paresthesias 11/26/2016   Hypertension 08/27/2016   GERD (gastroesophageal reflux disease) 08/27/2016   Hyperglycemia 05/10/2016   History of nonmelanoma skin cancer  10/10/2015   Pain in right shoulder 09/13/2015   Osteoarthritis 07/06/2015   Lymphedema 05/30/2015   Other specified soft tissue disorders 05/30/2015   Hyperlipidemia 03/29/2015   Family history of type 2 diabetes mellitus in father 03/29/2015   DD (diverticular disease) 02/03/2015   Diverticulosis of intestine without perforation or abscess without bleeding 02/03/2015   DDD (degenerative disc disease), lumbar 03/03/2014   Intervertebral disc disorder with radiculopathy of lumbar region 03/03/2014   Lumbar radiculitis 03/03/2014   Lumbar stenosis with neurogenic claudication 03/03/2014   Arteriosclerosis of coronary artery 04/18/2012   S/P coronary artery stent placement 04/18/2012   Status post percutaneous transluminal coronary angioplasty 04/18/2012    Past Surgical History:  Procedure Laterality Date   CATARACT EXTRACTION W/PHACO Right 11/24/2019   Procedure: CATARACT EXTRACTION PHACO AND INTRAOCULAR LENS PLACEMENT (IOC) RIGHT MALYUGIN 8.59 00:50.8;  Surgeon: Birder Robson, MD;  Location: [place];  Service: Ophthalmology;  Laterality: Right;  COVID ( + ) 09-01-19   CATARACT EXTRACTION W/PHACO Left 12/15/2019   Procedure: CATARACT EXTRACTION PHACO AND INTRAOCULAR LENS PLACEMENT (IOC) LEFT 9.90  01:03.7;  Surgeon: Birder Robson, MD;  Location: [place];  Service: Ophthalmology;  Laterality: Left;   CORONARY ANGIOPLASTY WITH STENT PLACEMENT  2005   [facility]    Family History  Problem Relation Age of Onset   Hypertension Mother    Heart disease Mother    Heart disease Father    Hypertension Father     Social History   Tobacco Use  Smoking status: Former    Types: Cigarettes    Quit date: 05/16/1963    Years since quitting: 58.2   Smokeless tobacco: Never   Tobacco comments:    quit over 50 years ago  Substance Use Topics   Alcohol use: No     Current Outpatient Medications:    acetaminophen (TYLENOL) 500 MG tablet, Take 500 mg by mouth every  6 (six) hours as needed., Disp: , Rfl:    albuterol (VENTOLIN HFA) 108 (90 Base) MCG/ACT inhaler, USE 2 INHALATIONS BY MOUTH  EVERY 6 HOURS AS NEEDED FOR WHEEZING OR SHORTNESS OF  BREATH, Disp: 18 g, Rfl: 0   aspirin EC 81 MG tablet, Take 81 mg by mouth daily. , Disp: , Rfl:    atorvastatin (LIPITOR) 10 MG tablet, Take 1 tablet (10 mg total) by mouth daily., Disp: 90 tablet, Rfl: 1   Budeson-Glycopyrrol-Formoterol (BREZTRI AEROSPHERE) 160-9-4.8 MCG/ACT AERO, Inhale 2 puffs into the lungs 2 (two) times daily., Disp: 10.7 g, Rfl: 2   diclofenac Sodium (VOLTAREN) 1 % GEL, APPLY 4 GRAMS TOPICALLY 4  TIMES DAILY, Disp: 600 g, Rfl: 0   furosemide (LASIX) 40 MG tablet, Take 40 mg by mouth in the morning., Disp: , Rfl:    gabapentin (NEURONTIN) 100 MG capsule, Take 1 capsule (100 mg total) by mouth 3 (three) times daily., Disp: 90 capsule, Rfl: 2   montelukast (SINGULAIR) 10 MG tablet, Take 1 tablet (10 mg total) by mouth at bedtime., Disp: 90 tablet, Rfl: 1   potassium chloride SA (KLOR-CON) 20 MEQ tablet, TAKE 1 TABLET BY MOUTH  DAILY, Disp: 90 tablet, Rfl: 3   predniSONE (DELTASONE) 10 MG tablet, Take 4 tablets by mouth with breakfast for 2 days, 2 tablets by mouth for 2 days and 1 tablet by mouth for 2 days., Disp: 14 tablet, Rfl: 0   Probiotic Product (PROBIOTIC PO), Take by mouth daily. , Disp: , Rfl:    tamsulosin (FLOMAX) 0.4 MG CAPS capsule, TAKE 1 CAPSULE BY MOUTH  DAILY, Disp: 90 capsule, Rfl: 3   Loratadine 10 MG CAPS, Take 10 mg by mouth daily.  (Patient not taking: Reported on 07/04/2021), Disp: , Rfl:   Allergies  Allergen Reactions   Gramineae Pollens Itching and Shortness Of Breath    Sneezing   Cat Hair Extract Itching    Watery eyes and sneezing   Breo Ellipta [Fluticasone Furoate-Vilanterol] Rash    Patient stated that it made his mouth break out   Rosuvastatin Rash and Other (See Comments)    No reaction per pt, He saw some information regarding Crestor and adverse effects      I personally reviewed active problem list, medication list, allergies, family history, social history, health maintenance with the patient/caregiver today.   ROS  Ten systems reviewed and is negative except as mentioned in HPI   Objective  Vitals:   07/18/21 0847  BP: 134/74  Pulse: 83  Resp: 16  Temp: 98.1 F (36.7 C)  SpO2: 97%  Weight: 188 lb (85.3 kg)  Height: 6' (1.829 m)    Body mass index is 25.5 kg/m.  Physical Exam  Constitutional: Patient appears well-developed and well-nourished.  No distress.  HEENT: head atraumatic, normocephalic, pupils equal and reactive to light, neck supple Cardiovascular: Normal rate, regular rhythm and normal heart sounds.  No murmur heard. No BLE edema. Pulmonary/Chest: Effort normal and breath sounds normal. No respiratory distress. Abdominal: Soft.  There is no tenderness. Psychiatric: Patient has a normal mood  and affect. behavior is normal. Judgment and thought content normal.   Recent Results (from the past 2160 hour(s))  Resp Panel by RT-PCR (Flu A&B, Covid) Nasopharyngeal Swab     Status: None   Collection Time: 07/14/21 11:51 AM   Specimen: Nasopharyngeal Swab; Nasopharyngeal(NP) swabs in vial transport medium  Result Value Ref Range   SARS Coronavirus 2 by RT PCR NEGATIVE NEGATIVE    Comment: (NOTE) SARS-CoV-2 target nucleic acids are NOT DETECTED.  The SARS-CoV-2 RNA is generally detectable in upper respiratory specimens during the acute phase of infection. The lowest concentration of SARS-CoV-2 viral copies this assay can detect is 138 copies/mL. A negative result does not preclude SARS-Cov-2 infection and should not be used as the sole basis for treatment or other patient management decisions. A negative result may occur with  improper specimen collection/handling, submission of specimen other than nasopharyngeal swab, presence of viral mutation(s) within the areas targeted by this assay, and inadequate number of  viral copies(<138 copies/mL). A negative result must be combined with clinical observations, patient history, and epidemiological information. The expected result is Negative.  Fact Sheet for Patients:  EntrepreneurPulse.com.au  Fact Sheet for Healthcare Providers:  IncredibleEmployment.be  This test is no t yet approved or cleared by the [place] FDA and  has been authorized for detection and/or diagnosis of SARS-CoV-2 by FDA under an Emergency Use Authorization (EUA). This EUA will remain  in effect (meaning this test can be used) for the duration of the COVID-19 declaration under Section 564(b)(1) of the Act, 21 U.S.C.section 360bbb-3(b)(1), unless the authorization is terminated  or revoked sooner.       Influenza A by PCR NEGATIVE NEGATIVE   Influenza B by PCR NEGATIVE NEGATIVE    Comment: (NOTE) The Xpert Xpress SARS-CoV-2/FLU/RSV plus assay is intended as an aid in the diagnosis of influenza from Nasopharyngeal swab specimens and should not be used as a sole basis for treatment. Nasal washings and aspirates are unacceptable for Xpert Xpress SARS-CoV-2/FLU/RSV testing.  Fact Sheet for Patients: EntrepreneurPulse.com.au  Fact Sheet for Healthcare Providers: IncredibleEmployment.be  This test is not yet approved or cleared by the [place] FDA and has been authorized for detection and/or diagnosis of SARS-CoV-2 by FDA under an Emergency Use Authorization (EUA). This EUA will remain in effect (meaning this test can be used) for the duration of the COVID-19 declaration under Section 564(b)(1) of the Act, 21 U.S.C. section 360bbb-3(b)(1), unless the authorization is terminated or revoked.  Performed at [facility], [address] [place], [place] [address]   CBC     Status: None   Collection Time: 07/14/21  1:18 PM  Result Value Ref Range   WBC 7.0 4.0 - 10.5 K/uL   RBC  4.67 4.22 - 5.81 MIL/uL   Hemoglobin 14.7 13.0 - 17.0 g/dL   HCT 45.9 39.0 - 52.0 %   MCV 98.3 80.0 - 100.0 fL   MCH 31.5 26.0 - 34.0 pg   MCHC 32.0 30.0 - 36.0 g/dL   RDW 12.7 11.5 - 15.5 %   Platelets 235 150 - 400 K/uL   nRBC 0.0 0.0 - 0.2 %    Comment: Performed at [facility], [address] [place], [place] [address]  Brain natriuretic peptide     Status: Abnormal   Collection Time: 07/14/21  1:18 PM  Result Value Ref Range   B Natriuretic Peptide 407.0 (H) 0.0 - 100.0 pg/mL    Comment: Performed at [employer]  [facility], [address] [place], [place] [address]  Basic metabolic panel     Status: Abnormal   Collection Time: 07/14/21  1:18 PM  Result Value Ref Range   Sodium 134 (L) 135 - 145 mmol/L   Potassium 4.1 3.5 - 5.1 mmol/L   Chloride 101 98 - 111 mmol/L   CO2 26 22 - 32 mmol/L   Glucose, Bld 97 70 - 99 mg/dL    Comment: Glucose reference range applies only to samples taken after fasting for at least 8 hours.   BUN 14 8 - 23 mg/dL   Creatinine, Ser 0.84 0.61 - 1.24 mg/dL   Calcium 8.9 8.9 - 10.3 mg/dL   GFR, Estimated >60 >60 mL/min    Comment: (NOTE) Calculated using the CKD-EPI Creatinine Equation (2021)    Anion gap 7 5 - 15    Comment: Performed at [facility], [address], [place], [place] [address]     PHQ2/9: Depression screen [facility] 2/9 07/18/2021 07/04/2021 04/07/2021 03/29/2021 02/13/2021  Decreased Interest 0 0 0 0 0  Down, Depressed, Hopeless 0 0 0 0 0  PHQ - 2 Score 0 0 0 0 0  Altered sleeping 0 0 - - 0  Tired, decreased energy 0 0 - - 0  Change in appetite 0 0 - - 0  Feeling bad or failure about yourself  0 0 - - 0  Trouble concentrating 0 0 - - 0  Moving slowly or fidgety/restless 0 0 - - 0  Suicidal thoughts 0 0 - - 0  PHQ-9 Score 0 0 - - 0  Difficult doing work/chores - - - - Not difficult at all  Some recent data might be hidden    phq 9 is negative   Fall Risk: Fall Risk  07/18/2021 07/04/2021 04/07/2021  03/29/2021 02/13/2021  Falls in the past year? 0 0 0 0 1  Number falls in past yr: 0 0 0 0 1  Injury with Fall? 0 0 0 0 1  Risk for fall due to : No Fall Risks No Fall Risks - - -  Follow up Falls prevention discussed Falls prevention discussed Falls prevention discussed - -      Functional Status Survey: Is the patient deaf or have difficulty hearing?: No Does the patient have difficulty seeing, even when wearing glasses/contacts?: No Does the patient have difficulty concentrating, remembering, or making decisions?: No Does the patient have difficulty walking or climbing stairs?: Yes Does the patient have difficulty dressing or bathing?: No Does the patient have difficulty doing errands alone such as visiting a doctor's office or shopping?: No    Assessment & Plan  1. 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 Nocturnal cough  - dextromethorphan-guaiFENesin (MUCINEX DM) 30-600 MG 12hr tablet; Take 1 tablet by mouth 2 (two) times daily.  Dispense: 60 tablet; Refill: 2 - chlorpheniramine-HYDROcodone (TUSSIONEX PENNKINETIC ER) 10-8 MG/5ML SUER; Take 5 mLs by mouth at bedtime as needed.  Dispense: 140 mL; Refill: 0

## 2021-07-17 NOTE — Telephone Encounter (Signed)
Reason for Disposition  Continuous (nonstop) coughing interferes with work, school, or sleeping  Answer Assessment - Initial Assessment Questions 1. RESPIRATORY STATUS: "Describe your breathing?" (e.g., wheezing, shortness of breath, unable to speak, severe coughing)      Pt calling in.  He is wheezing and having shortness of breath.   He went to the urgent care on Friday but not doing better.   Given prednisone but it was a reduced dose.   It's not helping.   I'm wheezing, coughing and shortness of breath.   My O2 89-90%.  91-92% normally.  Don't have COPD or asthma.   I was there the other day and was given 2 inhalers but they are not helping.  Dr. Ancil Boozer gave me prednisone a month or so ago but it was a higher dose.    But the urgent care gave me a reduced dose.     Our connection was bad so I called him back. 2. ONSET: "When did this breathing problem begin?"      Saw Dr. Ancil Boozer a week ago and was having these problems.   Last Friday I was labored for breathing coughing and wheezing. 3. PATTERN "Does the difficult breathing come and go, or has it been constant since it started?"      Constant 4. SEVERITY: "How bad is your breathing?" (e.g., mild, moderate, severe)    - MILD: No SOB at rest, mild SOB with walking, speaks normally in sentences, can lie down, no retractions, pulse < 100.    - MODERATE: SOB at rest, SOB with minimal exertion and prefers to sit, cannot lie down flat, speaks in phrases, mild retractions, audible wheezing, pulse 100-120.    - SEVERE: Very SOB at rest, speaks in single words, struggling to breathe, sitting hunched forward, retractions, pulse > 120      I'm more short of breath walking around but short of breath sitting.    I've had fluid around my heart before.   My ankles are a little swollen too. 5. RECURRENT SYMPTOM: "Have you had difficulty breathing before?" If Yes, ask: "When was the last time?" and "What happened that time?"      Yes 6. CARDIAC HISTORY: "Do you  have any history of heart disease?" (e.g., heart attack, angina, bypass surgery, angioplasty)      Fluid around my heart. 7. LUNG HISTORY: "Do you have any history of lung disease?"  (e.g., pulmonary embolus, asthma, emphysema)     No 8. CAUSE: "What do you think is causing the breathing problem?"      I've been sick 9. OTHER SYMPTOMS: "Do you have any other symptoms? (e.g., dizziness, runny nose, cough, chest pain, fever)     No runny nose or sore throat, no fever  Protocols used: Breathing Difficulty-A-AH

## 2021-07-17 NOTE — Telephone Encounter (Signed)
°  Chief Complaint: continued wheezing, coughing and shortness of breath.  Saw Dr. Ancil Boozer a week ago for same and has been to urgent care on Friday Symptoms: coughing, shortness of breath, wheezing Frequency: constant Pertinent Negatives: Patient denies chest pain Disposition: [] ED /[] Urgent Care (no appt availability in office) / [x] Appointment(In office/virtual)/ []  [facility]/ [] Home Care/ [] Refused Recommended Disposition  Additional Notes: I made him an appt with Dr. Ancil Boozer for 07/18/2021 at 9:00.   Protocol is within 4 hrs but he wanted to wait and see Dr. Ancil Boozer in the morning.   I went over the s/s to go to the ED or urgent care for.   He verbalized understanding.

## 2021-07-18 ENCOUNTER — Encounter: Payer: Self-pay | Admitting: Family Medicine

## 2021-07-18 ENCOUNTER — Ambulatory Visit (INDEPENDENT_AMBULATORY_CARE_PROVIDER_SITE_OTHER): Payer: Medicare Other | Admitting: Family Medicine

## 2021-07-18 VITALS — BP 134/74 | HR 83 | Temp 98.1°F | Resp 16 | Ht 72.0 in | Wt 188.0 lb

## 2021-07-18 DIAGNOSIS — J449 Chronic obstructive pulmonary disease, unspecified: Secondary | ICD-10-CM

## 2021-07-18 DIAGNOSIS — J8410 Pulmonary fibrosis, unspecified: Secondary | ICD-10-CM | POA: Diagnosis not present

## 2021-07-18 DIAGNOSIS — R058 Other specified cough: Secondary | ICD-10-CM | POA: Diagnosis not present

## 2021-07-18 MED ORDER — DM-GUAIFENESIN ER 30-600 MG PO TB12: 1.0000 | ORAL_TABLET | Freq: Two times a day (BID) | ORAL | 2 refills | Status: DC

## 2021-07-18 MED ORDER — HYDROCOD POLST-CPM POLST ER 10-8 MG/5ML PO SUER: 5.0000 mL | Freq: Every evening | ORAL | 0 refills | Status: DC | PRN

## 2021-07-28 ENCOUNTER — Other Ambulatory Visit: Payer: Self-pay | Admitting: Family Medicine

## 2021-07-28 DIAGNOSIS — Z961 Presence of intraocular lens: Secondary | ICD-10-CM | POA: Diagnosis not present

## 2021-08-01 ENCOUNTER — Telehealth: Payer: Self-pay

## 2021-08-01 NOTE — Telephone Encounter (Signed)
Copied from [place] [id number]. Topic: Referral - Request for Referral >> Aug 01, 2021  8:35 AM McGill, Nelva Bush wrote: Has patient seen PCP for this complaint? Yes.   *If NO, is insurance requiring patient see PCP for this issue before PCP can refer them? Referral for which specialty: Pulmonology Preferred provider/office: [facility]/Address: [address] [place], [place], [place] [address] Reason for referral: Pt requested referral to be sent to this location. Because Mark [name] at [facility] cannot see him until 08/17/2021.

## 2021-08-08 ENCOUNTER — Ambulatory Visit: Payer: Medicare HMO | Admitting: Pulmonary Disease

## 2021-08-08 ENCOUNTER — Other Ambulatory Visit: Payer: Self-pay

## 2021-08-08 ENCOUNTER — Encounter: Payer: Self-pay | Admitting: Pulmonary Disease

## 2021-08-08 ENCOUNTER — Other Ambulatory Visit: Payer: Self-pay | Admitting: Family Medicine

## 2021-08-08 ENCOUNTER — Other Ambulatory Visit
Admission: RE | Admit: 2021-08-08 | Discharge: 2021-08-08 | Disposition: A | Discharge status: Home / Self Care | Payer: Medicare HMO | Attending: Pulmonary Disease | Admitting: Pulmonary Disease

## 2021-08-08 VITALS — BP 110/78 | HR 84 | Temp 97.3°F | Ht 69.0 in | Wt 182.4 lb

## 2021-08-08 DIAGNOSIS — J8410 Pulmonary fibrosis, unspecified: Secondary | ICD-10-CM | POA: Diagnosis not present

## 2021-08-08 DIAGNOSIS — R0602 Shortness of breath: Secondary | ICD-10-CM | POA: Diagnosis not present

## 2021-08-08 DIAGNOSIS — I420 Dilated cardiomyopathy: Secondary | ICD-10-CM | POA: Diagnosis not present

## 2021-08-08 DIAGNOSIS — I340 Nonrheumatic mitral (valve) insufficiency: Secondary | ICD-10-CM

## 2021-08-08 DIAGNOSIS — J45909 Unspecified asthma, uncomplicated: Secondary | ICD-10-CM | POA: Diagnosis not present

## 2021-08-08 LAB — CBC WITH DIFFERENTIAL/PLATELET
Abs Immature Granulocytes: 0.03 10*3/uL (ref 0.00–0.07)
Basophils Absolute: 0 10*3/uL (ref 0.0–0.1)
Basophils Relative: 0 %
Eosinophils Absolute: 0.3 10*3/uL (ref 0.0–0.5)
Eosinophils Relative: 3 %
HCT: 44.5 % (ref 39.0–52.0)
Hemoglobin: 14.6 g/dL (ref 13.0–17.0)
Immature Granulocytes: 0 %
Lymphocytes Relative: 32 %
Lymphs Abs: 2.8 10*3/uL (ref 0.7–4.0)
MCH: 31.7 pg (ref 26.0–34.0)
MCHC: 32.8 g/dL (ref 30.0–36.0)
MCV: 96.5 fL (ref 80.0–100.0)
Monocytes Absolute: 1 10*3/uL (ref 0.1–1.0)
Monocytes Relative: 12 %
Neutro Abs: 4.6 10*3/uL (ref 1.7–7.7)
Neutrophils Relative %: 53 %
Platelets: 261 10*3/uL (ref 150–400)
RBC: 4.61 MIL/uL (ref 4.22–5.81)
RDW: 12.2 % (ref 11.5–15.5)
WBC: 8.8 10*3/uL (ref 4.0–10.5)
nRBC: 0 % (ref 0.0–0.2)

## 2021-08-08 NOTE — Progress Notes (Signed)
Subjective:    [name], male    DOB: [birth year], 88 y.o.   MRN: [id number] Chief Complaint  [name] presents with   Consult    Pulmonary Fibrosis    HPI Mark [name] is a 88 year old very remote former smoker with minimal tobacco exposure who presents for evaluation of potential pulmonary fibrosis.  Mark [name] is kindly referred by Dr. Steele Sizer.  Mark [name] states that for Mark last 4 to 6 weeks he has noted shortness of breath that is manifested as orthopnea and paroxysmal nocturnal dyspnea.  He does not note as much breathlessness during Mark day unless he "hurries".  He also notes a dry cough and no hemoptysis.  He does state that after starting inhaler therapy with Judithann Sauger his cough has been somewhat better.  His cough is made worse if he moves or talks.  Mark last several episodes of paroxysmal nocturnal dyspnea and orthopnea have been relieved by sitting up in a recliner and drinking some coffee.  Mark [name] notes that he has had a diagnosis of asthma previously he is noted to have allergies to cats and dogs.  Previously had only been on as needed albuterol but as noted recently was started on [place].  He does note some "sores" mouth with Mark Judithann Sauger but has continued to use it.  He is not rinsing after Mark inhaler use.  He tells me that he had COVID in February 2021 and also noted in December 2021.  Thinks he may had had at one other time as well.  He was symptomatic with Mark COVID with regards to shortness of breath but did not require hospitalization.  He has known congestive heart failure with an EF of 20% and moderate mitral regurgitation had been on Jardiance but had to discontinue this medication due to cost which was prohibitive for him.  He was supposed to adjust his diuretic therapy however he continues to be on Lasix 40 mg daily with no adjustment.  He had a chest x-ray performed on 14 July 2021 that was read as having no acute pulmonary disease and  "unchanged chronic interstitial lung disease".  Mark [name] has not had chest pain.  He does note intermittent lower extremity edema.  He has not had any calf tenderness.  As noted his cough has been dry, no hemoptysis.  No fevers, chills or sweats.  No weight loss or anorexia.  [name] has no military history.  No significant occupational exposures in Mark past.  I have reviewed prior imaging particularly CT chest performed 27 February 2019 showing no evidence of pulmonary fibrosis some minor basilar scarring.  Reviewed his most recent chest x-ray from 14 July 2021 and I am not overly impressed with significant fibrotic changes.  There may be some mild residual postinflammatory fibrosis post COVID-19.  Review of Systems A 10 point review of systems was performed and it is as noted above otherwise negative.  Past Medical History:  Diagnosis Date   Actinic keratosis 11/20/2018   right proximal lateral bicep.   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 Deep margin involved. 2nd Excision 12/23/2018, no residual lesion, margins free   Carotid artery disease ([place])    Coronary artery disease 2005   COVID-19 09/01/2019   symptoms resolved 09/05/19   GERD (gastroesophageal reflux disease)    Hyperkalemia    Hyperlipidemia    Hypertension    Past heart attack 2005  Squamous cell carcinoma of skin 08/21/2018   Left distal dorsum forearm. WD SCC. [facility]   Past Surgical History:  Procedure Laterality Date   CATARACT EXTRACTION W/PHACO Right 11/24/2019   Procedure: CATARACT EXTRACTION PHACO AND INTRAOCULAR LENS PLACEMENT (IOC) RIGHT MALYUGIN 8.59 00:50.8;  Surgeon: Birder Robson, MD;  Location: [place];  Service: Ophthalmology;  Laterality: Right;  COVID ( + ) 09-01-19   CATARACT EXTRACTION W/PHACO Left 12/15/2019   Procedure: CATARACT EXTRACTION PHACO AND INTRAOCULAR LENS PLACEMENT (IOC) LEFT 9.90  01:03.7;  Surgeon: Birder Robson, MD;   Location: [place];  Service: Ophthalmology;  Laterality: Left;   CORONARY ANGIOPLASTY WITH STENT PLACEMENT  2005   [facility]   Family History  Problem Relation Age of Onset   Hypertension Mother    Heart disease Mother    Heart disease Father    Hypertension Father    Social History   Tobacco Use   Smoking status: Former    Packs/day: 0.25    Years: 15.00    Pack years: 3.75    Types: Cigarettes    Quit date: 05/16/1963    Years since quitting: 58.2   Smokeless tobacco: Never   Tobacco comments:    quit over 50 years ago- 08/08/2021  Substance Use Topics   Alcohol use: No   Allergies  Allergen Reactions   Gramineae Pollens Itching and Shortness Of Breath    Sneezing   Cat Hair Extract Itching    Watery eyes and sneezing   Breo Ellipta [Fluticasone Furoate-Vilanterol] Rash    [name] stated that it made his mouth break out   Rosuvastatin Rash and Other (See Comments)    No reaction per pt, He saw some information regarding Crestor and adverse effects    Current Meds  Medication Sig   acetaminophen (TYLENOL) 500 MG tablet Take 500 mg by mouth every 6 (six) hours as needed.   albuterol (VENTOLIN HFA) 108 (90 Base) MCG/ACT inhaler USE 2 INHALATIONS BY MOUTH EVERY 6 HOURS AS NEEDED FOR WHEEZING OR SHORTNESS OF BREATH   aspirin EC 81 MG tablet Take 81 mg by mouth daily.    atorvastatin (LIPITOR) 10 MG tablet Take 1 tablet (10 mg total) by mouth daily.   Budeson-Glycopyrrol-Formoterol (BREZTRI AEROSPHERE) 160-9-4.8 MCG/ACT AERO Inhale 2 puffs into Mark lungs 2 (two) times daily.   chlorpheniramine-HYDROcodone (TUSSIONEX PENNKINETIC ER) 10-8 MG/5ML SUER Take 5 mLs by mouth at bedtime as needed.   dextromethorphan-guaiFENesin (MUCINEX DM) 30-600 MG 12hr tablet Take 1 tablet by mouth 2 (two) times daily.   diclofenac Sodium (VOLTAREN) 1 % GEL APPLY 4 GRAMS TOPICALLY 4  TIMES DAILY   furosemide (LASIX) 40 MG tablet Take 40 mg by mouth in Mark morning.   gabapentin  (NEURONTIN) 100 MG capsule Take 1 capsule (100 mg total) by mouth 3 (three) times daily.   montelukast (SINGULAIR) 10 MG tablet Take 1 tablet (10 mg total) by mouth at bedtime.   potassium chloride SA (KLOR-CON) 20 MEQ tablet TAKE 1 TABLET BY MOUTH  DAILY   predniSONE (DELTASONE) 10 MG tablet Take 4 tablets by mouth with breakfast for 2 days, 2 tablets by mouth for 2 days and 1 tablet by mouth for 2 days.   Probiotic Product (PROBIOTIC PO) Take by mouth daily.    tamsulosin (FLOMAX) 0.4 MG CAPS capsule TAKE 1 CAPSULE BY MOUTH  DAILY   Immunization History  Administered Date(s) Administered   Fluad Quad(high Dose 65+) 08/17/2019, 05/25/2021   Influenza, High Dose Seasonal  PF 03/29/2015, 06/15/2016, 05/28/2018   Influenza-Unspecified 05/23/2017, 10/23/2018, 07/15/2020   PFIZER(Purple Top)SARS-COV-2 Vaccination 10/26/2019, 11/18/2019, 07/15/2020   Pneumococcal Conjugate-13 06/11/2016   Pneumococcal Polysaccharide-23 11/21/2017   Tdap 07/28/2017   Zoster Recombinat (Shingrix) 01/12/2021, 04/14/2021   Zoster, Live 05/07/2007       Objective:   Physical Exam BP 110/78 (BP Location: Left Arm, [name] Position: Sitting, Cuff Size: Normal)    Pulse 84    Temp (!) 97.3 F (36.3 C) (Temporal)    Ht 5\' 9"  (1.753 m)    Wt 182 lb 6.4 oz (82.7 kg)    SpO2 97%    BMI 26.94 kg/m  GENERAL: Well-developed well-nourished spry gentleman who looks much younger than his stated age.  Ambulating with Mark assistance of a cane. HEAD: Normocephalic, atraumatic.  EYES: Pupils equal, round, reactive to light.  No scleral icterus.  Mild periorbital edema. MOUTH: Nose/mouth/throat not examined due to masking requirements for COVID 19. NECK: Supple. No thyromegaly. Trachea midline. No JVD.  No adenopathy. PULMONARY: Good air entry bilaterally.  No adventitious sounds. CARDIOVASCULAR: S1 and S2. Regular rate and rhythm.  Grade 2/6 to 3/6 systolic ejection murmur. ABDOMEN: Benign. MUSCULOSKELETAL: No joint deformity,  no clubbing, 1-2+ pitting edema LEs.  NEUROLOGIC: No focal deficit, gait assisted with cane, speech is fluent. SKIN: Intact,warm,dry. 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 Shortness of breath  R06.02 CBC w/Diff    Allergen Panel (27) + IGE    ECHOCARDIOGRAM COMPLETE    Pulmonary Function Test [facility] Only    CT Chest High Resolution   Manifested as orthopnea, paroxysmal nocturnal dyspnea Suspect cardiac in origin given EF of 20% Reassess with 2D echo PFTs to eval lung function    3. Dilated cardiomyopathy ([place])  I42.0    This issue adds complexity to his management Query worsening status    4. Mitral valve insufficiency, unspecified etiology  I34.0    This issue adds complexity to his management Query worsening status    5. Persistent asthma without complication, unspecified asthma severity  J45.909    Present of longstanding Reassess with PFTs, allergen panel Continue Breztri and as needed albuterol for now     Orders Placed This Encounter  Procedures   CT Chest High Resolution    Standing Status:   Future    Standing Expiration Date:   08/08/2022    Order Specific Question:   Preferred imaging location?    Answer:   [facility]   CBC w/Diff    Standing Status:   Future    Number of Occurrences:   1    Standing Expiration Date:   08/08/2022   Allergen Panel (27) + IGE    Standing Status:   Future    Number of Occurrences:   1    Standing Expiration Date:   08/08/2022   Pulmonary Function Test [facility] Only    Standing Status:   Future    Standing Expiration Date:   08/08/2022    Order Specific Question:   Full PFT: includes Mark following: basic spirometry, spirometry pre & post bronchodilator,  diffusion capacity (DLCO), lung volumes    Answer:   Full PFT    Order Specific Question:  This test can only be performed at    Answer:   [facility]   ECHOCARDIOGRAM COMPLETE    Standing Status:   Future    Standing Expiration Date:   02/05/2022    Order Specific Question:   Where should this test be performed    Answer:   [facility]    Order Specific Question:   Perflutren DEFINITY (image enhancing agent) should be administered unless hypersensitivity or allergy exist    Answer:   Administer Perflutren    Order Specific Question:   Reason for exam-Echo    Answer:   SBE I33.9   [name] was instructed to continue Breztri for now.  I have provided him with a spacer and instructed him to rinse his mouth well after use that should help with his mouth sores.  With regards to his pulmonary fibrosis I do not think that this is very advanced or significant.  He may have some mild postinflammatory pulmonary fibrosis post COVID-19, this is usually not progressive.  His current symptoms are worrisome for cardiac etiology given that his dyspnea is mostly manifested as orthopnea and paroxysmal nocturnal dyspnea.  Will reassess with 2D echo and if any progression will ask Dr. Nehemiah Massed, [name]'s cardiologist, to render opinion.  We will see Mark [name] in follow-up in 4 weeks time he is to contact us prior to that time should any new difficulties arise.  He may see me or Mark nurse practitioner at that time.  Renold Don, MD Advanced Bronchoscopy PCCM [facility]    *This note was dictated using voice recognition software/Dragon.  Despite best efforts to proofread, errors can occur which can change Mark meaning. Any transcriptional errors that result from this process are unintentional and may not be fully corrected at Mark time of dictation.

## 2021-08-08 NOTE — Patient Instructions (Signed)
We are checking blood work to determine about your allergies.  Tums you are having at nighttime are concerning for heart symptoms, we are checking an echocardiogram which is a heart test to evaluate these issues.  The "scarring" seen in your lungs may represent fluid and not real scarring.  We have ordered a CT to check into these things.  Have ordered a breathing test.  We have provided you with a device to help you get the medicine of your inhaler deep into your lungs.  Make sure you rinse your mouth well after use your inhaler.  We will see him in follow-up in 3 to 4 weeks time with either me or the nurse practitioner at that time call sooner should any new problems arise.

## 2021-08-09 NOTE — Progress Notes (Signed)
Oral pathology result from [facility], right buccal mucosa: no evidence of malignancy. Report uploaded/Physician aware.

## 2021-08-10 ENCOUNTER — Telehealth: Payer: Self-pay

## 2021-08-10 LAB — ALLERGEN PANEL (27) + IGE
Alternaria Alternata IgE: <0.1 kU/L
Aspergillus Fumigatus IgE: <0.1 kU/L
Bahia Grass IgE: <0.1 kU/L
Bermuda Grass IgE: <0.1 kU/L
Cat Dander IgE: <0.1 kU/L
Cedar, Mountain IgE: <0.1 kU/L
Cladosporium Herbarum IgE: <0.1 kU/L
Cocklebur IgE: <0.1 kU/L
Cockroach, American IgE: <0.1 kU/L
Common Silver Birch IgE: <0.1 kU/L
D Farinae IgE: <0.1 kU/L
D Pteronyssinus IgE: <0.1 kU/L
Dog Dander IgE: <0.1 kU/L
Elm, American IgE: <0.1 kU/L
Hickory, White IgE: <0.1 kU/L
IgE (Immunoglobulin E), Serum: 15 IU/mL (ref 6–495)
Johnson Grass IgE: <0.1 kU/L
Kentucky Bluegrass IgE: <0.1 kU/L
Maple/Box Elder IgE: <0.1 kU/L
Mucor Racemosus IgE: <0.1 kU/L
Oak, White IgE: <0.1 kU/L
Penicillium Chrysogen IgE: <0.1 kU/L
Pigweed, Rough IgE: <0.1 kU/L
Plantain, English IgE: <0.1 kU/L
Ragweed, Short IgE: <0.1 kU/L
Setomelanomma Rostrat: <0.1 kU/L
Timothy Grass IgE: <0.1 kU/L
White Mulberry IgE: <0.1 kU/L

## 2021-08-10 NOTE — Telephone Encounter (Signed)
ATC patient in regards to upcoming Covid test on  Monday 1/16 @ 9:30am.   Will route to [place] triage for follow up.

## 2021-08-11 NOTE — Telephone Encounter (Signed)
Patient is aware of date/time of covid test and voiced his understanding.  Nothing further needed at this time.

## 2021-08-14 ENCOUNTER — Other Ambulatory Visit: Payer: Self-pay | Admitting: Family Medicine

## 2021-08-14 ENCOUNTER — Ambulatory Visit
Admission: RE | Admit: 2021-08-14 | Discharge: 2021-08-14 | Disposition: A | Discharge status: Home / Self Care | Payer: Medicare HMO | Source: Ambulatory Visit | Attending: Pulmonary Disease | Admitting: Pulmonary Disease

## 2021-08-14 ENCOUNTER — Other Ambulatory Visit: Payer: Self-pay

## 2021-08-14 ENCOUNTER — Other Ambulatory Visit
Admission: RE | Admit: 2021-08-14 | Discharge: 2021-08-14 | Disposition: A | Discharge status: Home / Self Care | Payer: Medicare HMO | Source: Ambulatory Visit | Attending: Pulmonary Disease | Admitting: Pulmonary Disease

## 2021-08-14 DIAGNOSIS — R0602 Shortness of breath: Secondary | ICD-10-CM | POA: Diagnosis not present

## 2021-08-14 DIAGNOSIS — Z20822 Contact with and (suspected) exposure to covid-19: Secondary | ICD-10-CM | POA: Diagnosis not present

## 2021-08-14 DIAGNOSIS — E785 Hyperlipidemia, unspecified: Secondary | ICD-10-CM | POA: Diagnosis not present

## 2021-08-14 DIAGNOSIS — R0601 Orthopnea: Secondary | ICD-10-CM | POA: Diagnosis not present

## 2021-08-14 DIAGNOSIS — R0600 Dyspnea, unspecified: Secondary | ICD-10-CM | POA: Diagnosis not present

## 2021-08-14 DIAGNOSIS — I340 Nonrheumatic mitral (valve) insufficiency: Secondary | ICD-10-CM | POA: Diagnosis not present

## 2021-08-14 DIAGNOSIS — I2510 Atherosclerotic heart disease of native coronary artery without angina pectoris: Secondary | ICD-10-CM | POA: Diagnosis not present

## 2021-08-14 DIAGNOSIS — I10 Essential (primary) hypertension: Secondary | ICD-10-CM | POA: Insufficient documentation

## 2021-08-14 DIAGNOSIS — K219 Gastro-esophageal reflux disease without esophagitis: Secondary | ICD-10-CM

## 2021-08-14 DIAGNOSIS — I330 Acute and subacute infective endocarditis: Secondary | ICD-10-CM | POA: Diagnosis not present

## 2021-08-14 LAB — ECHOCARDIOGRAM COMPLETE
AR max vel: 1.87 cm2
AV Area VTI: 2.26 cm2
AV Area mean vel: 1.83 cm2
AV Mean grad: 1.5 mmHg
AV Peak grad: 3.1 mmHg
Ao pk vel: 0.88 m/s
Area-P 1/2: 5.7 cm2
Calc EF: 25.9 %
S' Lateral: 6 cm
Single Plane A2C EF: 29 %
Single Plane A4C EF: 19.5 %

## 2021-08-14 NOTE — Progress Notes (Signed)
*  PRELIMINARY RESULTS* Echocardiogram 2D Echocardiogram has been performed.  Mark [name] 08/14/2021, 10:36 AM

## 2021-08-15 ENCOUNTER — Ambulatory Visit
Admission: RE | Admit: 2021-08-15 | Discharge: 2021-08-15 | Disposition: A | Discharge status: Home / Self Care | Payer: Medicare HMO | Source: Ambulatory Visit | Attending: Pulmonary Disease | Admitting: Pulmonary Disease

## 2021-08-15 ENCOUNTER — Ambulatory Visit: Payer: Medicare HMO

## 2021-08-15 ENCOUNTER — Encounter: Payer: Self-pay | Admitting: Family Medicine

## 2021-08-15 ENCOUNTER — Ambulatory Visit (HOSPITAL_BASED_OUTPATIENT_CLINIC_OR_DEPARTMENT_OTHER): Payer: Medicare HMO

## 2021-08-15 DIAGNOSIS — R0602 Shortness of breath: Secondary | ICD-10-CM

## 2021-08-15 DIAGNOSIS — J8410 Pulmonary fibrosis, unspecified: Secondary | ICD-10-CM | POA: Diagnosis present

## 2021-08-15 DIAGNOSIS — J479 Bronchiectasis, uncomplicated: Secondary | ICD-10-CM | POA: Diagnosis not present

## 2021-08-15 DIAGNOSIS — I358 Other nonrheumatic aortic valve disorders: Secondary | ICD-10-CM | POA: Diagnosis not present

## 2021-08-15 DIAGNOSIS — J90 Pleural effusion, not elsewhere classified: Secondary | ICD-10-CM | POA: Diagnosis not present

## 2021-08-15 DIAGNOSIS — I2510 Atherosclerotic heart disease of native coronary artery without angina pectoris: Secondary | ICD-10-CM | POA: Diagnosis not present

## 2021-08-15 LAB — SARS CORONAVIRUS 2 (TAT 6-24 HRS): SARS Coronavirus 2: NEGATIVE

## 2021-08-15 MED ORDER — ALBUTEROL SULFATE (2.5 MG/3ML) 0.083% IN NEBU
2.5000 mg | INHALATION_SOLUTION | Freq: Once | RESPIRATORY_TRACT | Status: AC
  Administered 2021-08-15: 2.5 mg via RESPIRATORY_TRACT
  Filled 2021-08-15: qty 3

## 2021-08-17 ENCOUNTER — Institutional Professional Consult (permissible substitution): Payer: Medicare Other | Admitting: Pulmonary Disease

## 2021-08-17 DIAGNOSIS — I255 Ischemic cardiomyopathy: Secondary | ICD-10-CM | POA: Diagnosis not present

## 2021-08-17 DIAGNOSIS — I25118 Atherosclerotic heart disease of native coronary artery with other forms of angina pectoris: Secondary | ICD-10-CM | POA: Diagnosis not present

## 2021-08-17 DIAGNOSIS — Z955 Presence of coronary angioplasty implant and graft: Secondary | ICD-10-CM | POA: Diagnosis not present

## 2021-08-17 DIAGNOSIS — I10 Essential (primary) hypertension: Secondary | ICD-10-CM | POA: Diagnosis not present

## 2021-08-17 DIAGNOSIS — J449 Chronic obstructive pulmonary disease, unspecified: Secondary | ICD-10-CM | POA: Diagnosis not present

## 2021-08-17 DIAGNOSIS — I5022 Chronic systolic (congestive) heart failure: Secondary | ICD-10-CM | POA: Diagnosis not present

## 2021-08-17 DIAGNOSIS — I712 Thoracic aortic aneurysm, without rupture, unspecified: Secondary | ICD-10-CM | POA: Diagnosis not present

## 2021-08-17 DIAGNOSIS — R0602 Shortness of breath: Secondary | ICD-10-CM | POA: Diagnosis not present

## 2021-08-17 DIAGNOSIS — I208 Other forms of angina pectoris: Secondary | ICD-10-CM | POA: Diagnosis not present

## 2021-08-17 DIAGNOSIS — I2510 Atherosclerotic heart disease of native coronary artery without angina pectoris: Secondary | ICD-10-CM | POA: Diagnosis not present

## 2021-08-18 ENCOUNTER — Telehealth: Payer: Self-pay

## 2021-08-18 NOTE — Telephone Encounter (Signed)
Copied from [place] [phone]. Topic: General - Other >> Aug 18, 2021  3:57 PM Erick Blinks wrote: Reason for CRM: Pt has questions regarding his referral and wants to speak to office, please advise

## 2021-08-18 NOTE — Telephone Encounter (Signed)
Patient called stating that he received a call from [facility]. Patient was referred there when he was told he couldn't be seen until the 19th with [facility].  Patient was seen on the 19th so the [facility] Referral will be closed.

## 2021-08-22 ENCOUNTER — Ambulatory Visit: Payer: Self-pay | Admitting: *Deleted

## 2021-08-22 NOTE — Telephone Encounter (Signed)
Reason for Disposition  [1] MODERATE longstanding difficulty breathing (e.g., speaks in phrases, SOB even at rest, pulse 100-120) AND [2] SAME as normal  Answer Assessment - Initial Assessment Questions 1. RESPIRATORY STATUS: "Describe your breathing?" (e.g., wheezing, shortness of breath, unable to speak, severe coughing)      SOB- any exertion- SOB, coughing- non productive 2. ONSET: "When did this breathing problem begin?"      6 weeks 3. PATTERN "Does the difficult breathing come and go, or has it been constant since it started?"      constant 4. SEVERITY: "How bad is your breathing?" (e.g., mild, moderate, severe)    - MILD: No SOB at rest, mild SOB with walking, speaks normally in sentences, can lie down, no retractions, pulse < 100.    - MODERATE: SOB at rest, SOB with minimal exertion and prefers to sit, cannot lie down flat, speaks in phrases, mild retractions, audible wheezing, pulse 100-120.    - SEVERE: Very SOB at rest, speaks in single words, struggling to breathe, sitting hunched forward, retractions, pulse > 120      Mild/moderate 5. RECURRENT SYMPTOM: "Have you had difficulty breathing before?" If Yes, ask: "When was the last time?" and "What happened that time?"      Patient thinks he has allergy 6. CARDIAC HISTORY: "Do you have any history of heart disease?" (e.g., heart attack, angina, bypass surgery, angioplasty)      Patient has been evaluated 7. LUNG HISTORY: "Do you have any history of lung disease?"  (e.g., pulmonary embolus, asthma, emphysema)     Patient has been evaluated 8. CAUSE: "What do you think is causing the breathing problem?"      Allergy- patient thinks he needs antibiotic 9. OTHER SYMPTOMS: "Do you have any other symptoms? (e.g., dizziness, runny nose, cough, chest pain, fever)     cough 10. O2 SATURATION MONITOR:  "Do you use an oxygen saturation monitor (pulse oximeter) at home?" If Yes, "What is your reading (oxygen level) today?" "What is your usual  oxygen saturation reading?" (e.g., 95%)       *No Answer* 11. PREGNANCY: "Is there any chance you are pregnant?" "When was your last menstrual period?"       *No Answer* 12. TRAVEL: "Have you traveled out of the country in the last month?" (e.g., travel history, exposures)       *No Answer*  Protocols used: Breathing Difficulty-A-AH

## 2021-08-22 NOTE — Telephone Encounter (Signed)
°  Chief Complaint: cough, SOB Symptoms: cough, SOB Frequency: 6 weeks Pertinent Negatives: Patient denies fever, chest pain Disposition: [] ED /[] Urgent Care (no appt availability in office) / [x] Appointment(In office/virtual)/ []  [facility]/ [] Home Care/ [] Refused Recommended Disposition /[] [facility]/ []  Follow-up with PCP Additional Notes: Patient c/o ongoing cough- SOB with exertion. Patient has been evaluated by pulmonary/cardiology without identified diagnosis of problem

## 2021-08-22 NOTE — Progress Notes (Signed)
Name: Mark [name]   MRN: [id number]    DOB: Dec 27, 1928   Date:08/23/2021       Progress Note  Subjective  Chief Complaint  Cough/SOB  HPI  Chronic cough: Mark [name] came in today with his daughter-in-law Jackelyn Poling. He asked again for steroids. He sates in the past it used to help with his symptoms. He continues to have a daily cough, slightly better over the past couple of weeks and is now able to sleep on his bed ( previously on a recliner)  He finished Tussionex and has been taking sips of whisky with honey and lemon to control symptoms. He was recently seen by Dr. Duwayne Heck ( pulmonologist) and had CT chest and Echo. Explained he has fibrosis of his lungs and low EF, his symptoms are probably chronic now. He is using Brezti. Explain we can refill Tussionex to control symptoms but not able to give him prednisone at this time. He denies fever or chills. Sputum is whitish and clear   Patient Active Problem List   Diagnosis Date Noted   HFrEF (heart failure with reduced ejection fraction) ([place]) 12/02/2020   Cardiac syncope 09/22/2019   Respiratory tract infection due to COVID-19 virus 09/04/2019   Fibrosis of lung ([place]) 09/29/2018   Senile purpura ([place]) 09/29/2018   Atherosclerosis of aorta ([place]) 09/29/2018   Hardening of the aorta (main artery of the heart) ([place]) 09/29/2018   Abnormal ECG 06/16/2018   Thoracic aortic aneurysm without rupture 05/28/2018   CHF (congestive heart failure), NYHA class III, chronic, systolic ([place]) 59/56/3875   BPH with obstruction/lower urinary tract symptoms 02/26/2018   Ischemic cardiomyopathy 06/11/2017   Benign essential HTN 05/22/2017   Bilateral carotid artery stenosis 05/22/2017   Neuropathy 01/28/2017   Right leg paresthesias 11/26/2016   Hypertension 08/27/2016   GERD (gastroesophageal reflux disease) 08/27/2016   Hyperglycemia 05/10/2016   History of nonmelanoma skin cancer 10/10/2015   Pain in right shoulder 09/13/2015   Osteoarthritis  07/06/2015   Lymphedema 05/30/2015   Other specified soft tissue disorders 05/30/2015   Hyperlipidemia 03/29/2015   Family history of type 2 diabetes mellitus in father 03/29/2015   DD (diverticular disease) 02/03/2015   Diverticulosis of intestine without perforation or abscess without bleeding 02/03/2015   DDD (degenerative disc disease), lumbar 03/03/2014   Intervertebral disc disorder with radiculopathy of lumbar region 03/03/2014   Lumbar radiculitis 03/03/2014   Lumbar stenosis with neurogenic claudication 03/03/2014   Arteriosclerosis of coronary artery 04/18/2012   S/P coronary artery stent placement 04/18/2012   Status post percutaneous transluminal coronary angioplasty 04/18/2012    Past Surgical History:  Procedure Laterality Date   CATARACT EXTRACTION W/PHACO Right 11/24/2019   Procedure: CATARACT EXTRACTION PHACO AND INTRAOCULAR LENS PLACEMENT (IOC) RIGHT MALYUGIN 8.59 00:50.8;  Surgeon: Birder Robson, MD;  Location: [place];  Service: Ophthalmology;  Laterality: Right;  COVID ( + ) 09-01-19   CATARACT EXTRACTION W/PHACO Left 12/15/2019   Procedure: CATARACT EXTRACTION PHACO AND INTRAOCULAR LENS PLACEMENT (IOC) LEFT 9.90  01:03.7;  Surgeon: Birder Robson, MD;  Location: [place];  Service: Ophthalmology;  Laterality: Left;   CORONARY ANGIOPLASTY WITH STENT PLACEMENT  2005   [facility]    Family History  Problem Relation Age of Onset   Hypertension Mother    Heart disease Mother    Heart disease Father    Hypertension Father     Social History   Tobacco Use   Smoking status: Former    Packs/day: 0.25  Years: 15.00    Pack years: 3.75    Types: Cigarettes    Quit date: 05/16/1963    Years since quitting: 58.3   Smokeless tobacco: Never   Tobacco comments:    quit over 50 years ago- 08/08/2021  Substance Use Topics   Alcohol use: No     Current Outpatient Medications:    acetaminophen (TYLENOL) 500 MG tablet, Take 500 mg by mouth  every 6 (six) hours as needed., Disp: , Rfl:    albuterol (VENTOLIN HFA) 108 (90 Base) MCG/ACT inhaler, USE 2 INHALATIONS BY MOUTH EVERY 6 HOURS AS NEEDED FOR WHEEZING OR SHORTNESS OF BREATH, Disp: 8.5 g, Rfl: 0   aspirin EC 81 MG tablet, Take 81 mg by mouth daily. , Disp: , Rfl:    atorvastatin (LIPITOR) 10 MG tablet, Take 1 tablet (10 mg total) by mouth daily., Disp: 90 tablet, Rfl: 1   Budeson-Glycopyrrol-Formoterol (BREZTRI AEROSPHERE) 160-9-4.8 MCG/ACT AERO, Inhale 2 puffs into the lungs 2 (two) times daily., Disp: 10.7 g, Rfl: 2   chlorpheniramine-HYDROcodone (TUSSIONEX PENNKINETIC ER) 10-8 MG/5ML SUER, Take 5 mLs by mouth at bedtime as needed., Disp: 140 mL, Rfl: 0   dextromethorphan-guaiFENesin (MUCINEX DM) 30-600 MG 12hr tablet, Take 1 tablet by mouth 2 (two) times daily., Disp: 60 tablet, Rfl: 2   furosemide (LASIX) 40 MG tablet, Take 40 mg by mouth in the morning., Disp: , Rfl:    montelukast (SINGULAIR) 10 MG tablet, Take 1 tablet (10 mg total) by mouth at bedtime., Disp: 90 tablet, Rfl: 1   potassium chloride SA (KLOR-CON) 20 MEQ tablet, TAKE 1 TABLET BY MOUTH  DAILY, Disp: 90 tablet, Rfl: 3   Probiotic Product (PROBIOTIC PO), Take by mouth daily. , Disp: , Rfl:    tamsulosin (FLOMAX) 0.4 MG CAPS capsule, TAKE 1 CAPSULE BY MOUTH  DAILY, Disp: 90 capsule, Rfl: 3  Allergies  Allergen Reactions   Gramineae Pollens Itching and Shortness Of Breath    Sneezing   Cat Hair Extract Itching    Watery eyes and sneezing   Breo Ellipta [Fluticasone Furoate-Vilanterol] Rash    Patient stated that it made his mouth break out   Rosuvastatin Rash and Other (See Comments)    No reaction per pt, He saw some information regarding Crestor and adverse effects     I personally reviewed active problem list, medication list, allergies, family history, social history, health maintenance with the patient/caregiver today.   ROS  Ten systems reviewed and is negative except as mentioned in HPI    Objective  Vitals:   08/23/21 1349  BP: 138/70  Pulse: 72  Resp: 16  Temp: 97.9 F (36.6 C)  SpO2: 98%  Weight: 177 lb (80.3 kg)  Height: 5\' 10"  (1.778 m)    Body mass index is 25.4 kg/m.  Physical Exam  Constitutional: Patient appears well-developed and well-nourished.  No distress.  HEENT: head atraumatic, normocephalic, pupils equal and reactive to light, neck supple Cardiovascular: Normal rate, regular rhythm and normal heart sounds.  No murmur heard. No BLE edema. Pulmonary/Chest: Effort normal , rhonchi present during auscultation No respiratory distress. Abdominal: Soft.  There is no tenderness. Psychiatric: Patient has a normal mood and affect. behavior is normal. Judgment and thought content normal.   Recent Results (from the past 2160 hour(s))  Resp Panel by RT-PCR (Flu A&B, Covid) Nasopharyngeal Swab     Status: None   Collection Time: 07/14/21 11:51 AM   Specimen: Nasopharyngeal Swab; Nasopharyngeal(NP) swabs in vial transport medium  Result Value Ref Range   SARS Coronavirus 2 by RT PCR NEGATIVE NEGATIVE    Comment: (NOTE) SARS-CoV-2 target nucleic acids are NOT DETECTED.  The SARS-CoV-2 RNA is generally detectable in upper respiratory specimens during the acute phase of infection. The lowest concentration of SARS-CoV-2 viral copies this assay can detect is 138 copies/mL. A negative result does not preclude SARS-Cov-2 infection and should not be used as the sole basis for treatment or other patient management decisions. A negative result may occur with  improper specimen collection/handling, submission of specimen other than nasopharyngeal swab, presence of viral mutation(s) within the areas targeted by this assay, and inadequate number of viral copies(<138 copies/mL). A negative result must be combined with clinical observations, patient history, and epidemiological information. The expected result is Negative.  Fact Sheet for Patients:   EntrepreneurPulse.com.au  Fact Sheet for Healthcare Providers:  IncredibleEmployment.be  This test is no t yet approved or cleared by the [place] FDA and  has been authorized for detection and/or diagnosis of SARS-CoV-2 by FDA under an Emergency Use Authorization (EUA). This EUA will remain  in effect (meaning this test can be used) for the duration of the COVID-19 declaration under Section 564(b)(1) of the Act, 21 U.S.C.section 360bbb-3(b)(1), unless the authorization is terminated  or revoked sooner.       Influenza A by PCR NEGATIVE NEGATIVE   Influenza B by PCR NEGATIVE NEGATIVE    Comment: (NOTE) The Xpert Xpress SARS-CoV-2/FLU/RSV plus assay is intended as an aid in the diagnosis of influenza from Nasopharyngeal swab specimens and should not be used as a sole basis for treatment. Nasal washings and aspirates are unacceptable for Xpert Xpress SARS-CoV-2/FLU/RSV testing.  Fact Sheet for Patients: EntrepreneurPulse.com.au  Fact Sheet for Healthcare Providers: IncredibleEmployment.be  This test is not yet approved or cleared by the [place] FDA and has been authorized for detection and/or diagnosis of SARS-CoV-2 by FDA under an Emergency Use Authorization (EUA). This EUA will remain in effect (meaning this test can be used) for the duration of the COVID-19 declaration under Section 564(b)(1) of the Act, 21 U.S.C. section 360bbb-3(b)(1), unless the authorization is terminated or revoked.  Performed at [facility], [address] [place], [place] [address]   CBC     Status: None   Collection Time: 07/14/21  1:18 PM  Result Value Ref Range   WBC 7.0 4.0 - 10.5 K/uL   RBC 4.67 4.22 - 5.81 MIL/uL   Hemoglobin 14.7 13.0 - 17.0 g/dL   HCT 45.9 39.0 - 52.0 %   MCV 98.3 80.0 - 100.0 fL   MCH 31.5 26.0 - 34.0 pg   MCHC 32.0 30.0 - 36.0 g/dL   RDW 12.7 11.5 - 15.5 %   Platelets  235 150 - 400 K/uL   nRBC 0.0 0.0 - 0.2 %    Comment: Performed at [facility], [address], [place], [place] [address]  Brain natriuretic peptide     Status: Abnormal   Collection Time: 07/14/21  1:18 PM  Result Value Ref Range   B Natriuretic Peptide 407.0 (H) 0.0 - 100.0 pg/mL    Comment: Performed at [facility], [address] [place], [place] [address]  Basic metabolic panel     Status: Abnormal   Collection Time: 07/14/21  1:18 PM  Result Value Ref Range   Sodium 134 (L) 135 - 145 mmol/L   Potassium 4.1 3.5 - 5.1 mmol/L   Chloride 101 98 -  111 mmol/L   CO2 26 22 - 32 mmol/L   Glucose, Bld 97 70 - 99 mg/dL    Comment: Glucose reference range applies only to samples taken after fasting for at least 8 hours.   BUN 14 8 - 23 mg/dL   Creatinine, Ser 0.84 0.61 - 1.24 mg/dL   Calcium 8.9 8.9 - 10.3 mg/dL   GFR, Estimated >60 >60 mL/min    Comment: (NOTE) Calculated using the CKD-EPI Creatinine Equation (2021)    Anion gap 7 5 - 15    Comment: Performed at [facility], [address], [place], [place] [address]  Allergen Panel (27) + IGE     Status: None   Collection Time: 08/08/21  3:59 PM  Result Value Ref Range   Class Description Allergens Comment     Comment: (NOTE)    Levels of Specific IgE       Class  Description of Class    ---------------------------  -----  --------------------                   < 0.10         0         Negative           0.10 -    0.31         0/I       Equivocal/Low           0.32 -    0.55         I         Low           0.56 -    1.40         II        Moderate           1.41 -    3.90         III       High           3.91 -   19.00         IV        Very High          19.01 -  100.00         V         Very High                  >100.00         VI        Very High    IgE (Immunoglobulin E), Serum 15 6 - 495 IU/mL   D Pteronyssinus IgE <0.10 Class 0 kU/L   D Farinae IgE <0.10 Class 0 kU/L   Cat  Dander IgE <0.10 Class 0 kU/L   Dog Dander IgE <0.10 Class 0 kU/L   [place] Grass IgE <0.10 Class 0 kU/L   Timothy Grass IgE <0.10 Class 0 kU/L   [place] Bluegrass IgE <0.10 Class 0 kU/L   Johnson Grass IgE <0.10 Class 0 kU/L   Bahia Grass IgE <0.10 Class 0 kU/L   Cockroach, American IgE <0.10 Class 0 kU/L    Comment: (NOTE) This test was developed and its performance characteristics determined by [facility].  It has not been cleared or approved by the U.S. Food and Drug Administration. The FDA has determined that such clearance or approval is not necessary. This test is used for clinical purposes.  It should not be regarded  as investigational or for research.    Penicillium Chrysogen IgE <0.10 Class 0 kU/L   Cladosporium Herbarum IgE <0.10 Class 0 kU/L   Aspergillus Fumigatus IgE <0.10 Class 0 kU/L   Mucor Racemosus IgE <0.10 Class 0 kU/L   Alternaria Alternata IgE <0.10 Class 0 kU/L   Setomelanomma Rostrat <0.10 Class 0 kU/L   Oak, White IgE <0.10 Class 0 kU/L   Elm, American IgE <0.10 Class 0 kU/L   Maple/Box Elder IgE <0.10 Class 0 kU/L   Common Silver Wendee Copp IgE <0.10 Class 0 kU/L   Hickory, White IgE <0.10 Class 0 kU/L    Comment: (NOTE) This test was developed and its performance characteristics determined by [facility].  It has not been cleared or approved by the U.S. Food and Drug Administration. The FDA has determined that such clearance or approval is not necessary. This test is used for clinical purposes.  It should not be regarded as investigational or for research.    White Mulberry IgE <0.10 Class 0 kU/L   Cedar, [place] IgE <0.10 Class 0 kU/L   Ragweed, Short IgE <0.10 Class 0 kU/L   Plantain, English IgE <0.10 Class 0 kU/L   Cocklebur IgE <0.10 Class 0 kU/L   Pigweed, Rough IgE <0.10 Class 0 kU/L    Comment: (NOTE) Performed At: [facility] [place], [place] [id number] Rush Farmer MD WU:[id number]   CBC w/Diff     Status: None    Collection Time: 08/08/21  3:59 PM  Result Value Ref Range   WBC 8.8 4.0 - 10.5 K/uL   RBC 4.61 4.22 - 5.81 MIL/uL   Hemoglobin 14.6 13.0 - 17.0 g/dL   HCT 44.5 39.0 - 52.0 %   MCV 96.5 80.0 - 100.0 fL   MCH 31.7 26.0 - 34.0 pg   MCHC 32.8 30.0 - 36.0 g/dL   RDW 12.2 11.5 - 15.5 %   Platelets 261 150 - 400 K/uL   nRBC 0.0 0.0 - 0.2 %   Neutrophils Relative % 53 %   Neutro Abs 4.6 1.7 - 7.7 K/uL   Lymphocytes Relative 32 %   Lymphs Abs 2.8 0.7 - 4.0 K/uL   Monocytes Relative 12 %   Monocytes Absolute 1.0 0.1 - 1.0 K/uL   Eosinophils Relative 3 %   Eosinophils Absolute 0.3 0.0 - 0.5 K/uL   Basophils Relative 0 %   Basophils Absolute 0.0 0.0 - 0.1 K/uL   Immature Granulocytes 0 %   Abs Immature Granulocytes 0.03 0.00 - 0.07 K/uL    Comment: Performed at [facility], [place], [place] [address]  SARS CORONAVIRUS 2 (TAT 6-24 HRS) Nasopharyngeal Nasopharyngeal Swab     Status: None   Collection Time: 08/14/21  9:23 AM   Specimen: Nasopharyngeal Swab  Result Value Ref Range   SARS Coronavirus 2 NEGATIVE NEGATIVE    Comment: (NOTE) SARS-CoV-2 target nucleic acids are NOT DETECTED.  The SARS-CoV-2 RNA is generally detectable in upper and lower respiratory specimens during the acute phase of infection. Negative results do not preclude SARS-CoV-2 infection, do not rule out co-infections with other pathogens, and should not be used as the sole basis for treatment or other patient management decisions. Negative results must be combined with clinical observations, patient history, and epidemiological information. The expected result is Negative.  Fact Sheet for Patients: SugarRoll.be  Fact Sheet for Healthcare Providers: https://www.woods-mathews.com/  This test is not yet approved or cleared by the [place] FDA  and  has been authorized for detection and/or diagnosis of SARS-CoV-2 by FDA under an Emergency Use  Authorization (EUA). This EUA will remain  in effect (meaning this test can be used) for the duration of the COVID-19 declaration under Se ction 564(b)(1) of the Act, 21 U.S.C. section 360bbb-3(b)(1), unless the authorization is terminated or revoked sooner.  Performed at [facility], [place] [address], [place], [place] [address]   ECHOCARDIOGRAM COMPLETE     Status: None   Collection Time: 08/14/21 10:35 AM  Result Value Ref Range   Ao pk vel 0.88 m/s   AV Area VTI 2.26 cm2   AR max vel 1.87 cm2   AV Mean grad 1.5 mmHg   AV Peak grad 3.1 mmHg   Single Plane A2C EF 29.0 %   Single Plane A4C EF 19.5 %   Calc EF 25.9 %   S' Lateral 6.00 cm   AV Area mean vel 1.83 cm2   Area-P 1/2 5.70 cm2     PHQ2/9: Depression screen [facility] 2/9 08/23/2021 07/18/2021 07/04/2021 04/07/2021 03/29/2021  Decreased Interest 0 0 0 0 0  Down, Depressed, Hopeless 0 0 0 0 0  PHQ - 2 Score 0 0 0 0 0  Altered sleeping 0 0 0 - -  Tired, decreased energy 0 0 0 - -  Change in appetite 0 0 0 - -  Feeling bad or failure about yourself  0 0 0 - -  Trouble concentrating 0 0 0 - -  Moving slowly or fidgety/restless 0 0 0 - -  Suicidal thoughts 0 0 0 - -  PHQ-9 Score 0 0 0 - -  Difficult doing work/chores - - - - -  Some recent data might be hidden    phq 9 is negative   Fall Risk: Fall Risk  08/23/2021 07/18/2021 07/04/2021 04/07/2021 03/29/2021  Falls in the past year? 0 0 0 0 0  Number falls in past yr: 0 0 0 0 0  Injury with Fall? 0 0 0 0 0  Risk for fall due to : No Fall Risks No Fall Risks No Fall Risks - -  Follow up Falls prevention discussed Falls prevention discussed Falls prevention discussed Falls prevention discussed -      Functional Status Survey: Is the patient deaf or have difficulty hearing?: No Does the patient have difficulty seeing, even when wearing glasses/contacts?: No Does the patient have difficulty concentrating, remembering, or making decisions?: No Does the patient have  difficulty walking or climbing stairs?: Yes Does the patient have difficulty dressing or bathing?: No Does the patient have difficulty doing errands alone such as visiting a doctor's office or shopping?: No    Assessment & Plan  1. Fibrosis of lung ([place])  - chlorpheniramine-HYDROcodone (TUSSIONEX PENNKINETIC ER) 10-8 MG/5ML; Take 5 mLs by mouth every 12 (twelve) hours as needed.  Dispense: 473 mL; Refill: 0  2. Chronic cough  - chlorpheniramine-HYDROcodone (TUSSIONEX PENNKINETIC ER) 10-8 MG/5ML; Take 5 mLs by mouth every 12 (twelve) hours as needed.  Dispense: 473 mL; Refill: 0

## 2021-08-23 ENCOUNTER — Telehealth: Payer: Self-pay

## 2021-08-23 ENCOUNTER — Ambulatory Visit (INDEPENDENT_AMBULATORY_CARE_PROVIDER_SITE_OTHER): Payer: Medicare HMO | Admitting: Family Medicine

## 2021-08-23 ENCOUNTER — Encounter: Payer: Self-pay | Admitting: Family Medicine

## 2021-08-23 ENCOUNTER — Other Ambulatory Visit: Payer: Self-pay | Admitting: Family Medicine

## 2021-08-23 VITALS — BP 138/70 | HR 72 | Temp 97.9°F | Resp 16 | Ht 70.0 in | Wt 177.0 lb

## 2021-08-23 DIAGNOSIS — J8410 Pulmonary fibrosis, unspecified: Secondary | ICD-10-CM

## 2021-08-23 DIAGNOSIS — R053 Chronic cough: Secondary | ICD-10-CM

## 2021-08-23 MED ORDER — HYDROCOD POLI-CHLORPHE POLI ER 10-8 MG/5ML PO SUER: 5.0000 mL | Freq: Two times a day (BID) | ORAL | 0 refills | Status: DC | PRN

## 2021-08-23 NOTE — Telephone Encounter (Signed)
Copied from [place] [id number]. Topic: Quick Communication - Rx Refill/Question >> Aug 23, 2021  2:57 PM Pawlus, Brayton Layman A wrote: Pharmacist was following up on chlorpheniramine-HYDROcodone (TUSSIONEX [facility] ER) 10-8 MG/5ML, this is not covered under pts insurance, please advise is something else in a different quantity can be sent in.

## 2021-08-23 NOTE — Patient Instructions (Signed)
Think about Palliative care

## 2021-08-29 ENCOUNTER — Other Ambulatory Visit: Payer: Self-pay | Admitting: Family Medicine

## 2021-08-29 ENCOUNTER — Other Ambulatory Visit: Payer: Self-pay

## 2021-08-29 NOTE — Telephone Encounter (Signed)
Requested Prescriptions  Pending Prescriptions Disp Refills   albuterol (VENTOLIN HFA) 108 (90 Base) MCG/ACT inhaler [Pharmacy Med Name: ALBUTEROL HFA 90 MCG INHALER] 8.5 g 0    Sig: USE 2 INHALATIONS BY MOUTH EVERY 6 HOURS AS NEEDED FOR WHEEZING OR SHORTNESS OF BREATH     Pulmonology:  Beta Agonists Failed - 08/29/2021  8:56 AM      Failed - One inhaler should last at least one month. If the patient is requesting refills earlier, contact the patient to check for uncontrolled symptoms.      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

## 2021-08-30 DIAGNOSIS — I208 Other forms of angina pectoris: Secondary | ICD-10-CM | POA: Diagnosis not present

## 2021-08-30 DIAGNOSIS — I25118 Atherosclerotic heart disease of native coronary artery with other forms of angina pectoris: Secondary | ICD-10-CM | POA: Diagnosis not present

## 2021-08-30 DIAGNOSIS — R0602 Shortness of breath: Secondary | ICD-10-CM | POA: Diagnosis not present

## 2021-09-11 DIAGNOSIS — E7800 Pure hypercholesterolemia, unspecified: Secondary | ICD-10-CM | POA: Diagnosis not present

## 2021-09-11 DIAGNOSIS — I712 Thoracic aortic aneurysm, without rupture, unspecified: Secondary | ICD-10-CM | POA: Diagnosis not present

## 2021-09-11 DIAGNOSIS — Z955 Presence of coronary angioplasty implant and graft: Secondary | ICD-10-CM | POA: Diagnosis not present

## 2021-09-11 DIAGNOSIS — I5022 Chronic systolic (congestive) heart failure: Secondary | ICD-10-CM | POA: Diagnosis not present

## 2021-09-11 DIAGNOSIS — I255 Ischemic cardiomyopathy: Secondary | ICD-10-CM | POA: Diagnosis not present

## 2021-09-11 DIAGNOSIS — J8410 Pulmonary fibrosis, unspecified: Secondary | ICD-10-CM | POA: Diagnosis not present

## 2021-09-11 DIAGNOSIS — I6523 Occlusion and stenosis of bilateral carotid arteries: Secondary | ICD-10-CM | POA: Diagnosis not present

## 2021-09-11 DIAGNOSIS — I10 Essential (primary) hypertension: Secondary | ICD-10-CM | POA: Diagnosis not present

## 2021-09-11 DIAGNOSIS — I25118 Atherosclerotic heart disease of native coronary artery with other forms of angina pectoris: Secondary | ICD-10-CM | POA: Diagnosis not present

## 2021-09-12 ENCOUNTER — Encounter: Payer: Self-pay | Admitting: Pulmonary Disease

## 2021-09-12 ENCOUNTER — Other Ambulatory Visit: Payer: Self-pay

## 2021-09-12 ENCOUNTER — Ambulatory Visit: Payer: Medicare HMO | Admitting: Pulmonary Disease

## 2021-09-12 VITALS — BP 116/80 | HR 97 | Temp 97.5°F | Ht 69.0 in | Wt 179.0 lb

## 2021-09-12 DIAGNOSIS — R0600 Dyspnea, unspecified: Secondary | ICD-10-CM | POA: Diagnosis not present

## 2021-09-12 DIAGNOSIS — J8410 Pulmonary fibrosis, unspecified: Secondary | ICD-10-CM

## 2021-09-12 DIAGNOSIS — I420 Dilated cardiomyopathy: Secondary | ICD-10-CM | POA: Diagnosis not present

## 2021-09-12 DIAGNOSIS — J4520 Mild intermittent asthma, uncomplicated: Secondary | ICD-10-CM

## 2021-09-12 DIAGNOSIS — I340 Nonrheumatic mitral (valve) insufficiency: Secondary | ICD-10-CM | POA: Diagnosis not present

## 2021-09-12 MED ORDER — BREZTRI AEROSPHERE 160-9-4.8 MCG/ACT IN AERO: 2.0000 | INHALATION_SPRAY | Freq: Two times a day (BID) | RESPIRATORY_TRACT | 0 refills | Status: DC

## 2021-09-12 NOTE — Patient Instructions (Signed)
For cough you can try Delsym over-the-counter.  We are going to schedule an overnight oximetry this is a test that will tell us about your oxygen levels while you sleep.  You received more samples of the Breztri.  Let us know when you start getting low on it so we can call the prescription in for you if you think it is still helping you.  We will see you in follow-up in 2 months time

## 2021-09-12 NOTE — Progress Notes (Signed)
Subjective:    Patient ID: Mark [name], male    DOB: September 03, 1928, 88 y.o.   MRN: [id number] Chief Complaint  Patient presents with   Follow-up    HPI  [name] is a 88 year old very remote former smoker with minimal tobacco exposure who presents for follow-up of pulmonary fibrosis.  The patient was initially seen here on 08 August 2021 for the details of that visit please prior to that note.  At that time the patient stated that for several weeks prior to that visit he had noted shortness of breath that is manifested as orthopnea and paroxysmal nocturnal dyspnea.  He does not note as much breathlessness during the day unless he "hurries".  He also notes a dry cough and no hemoptysis.  He does state that after starting inhaler therapy with Mark [name] his cough has been somewhat better.  His cough is made worse if he moves or talks.  The last several episodes of paroxysmal nocturnal dyspnea and orthopnea have been relieved by sitting up in a recliner and drinking some coffee.  The patient notes that he has had a diagnosis of asthma previously he is noted to have allergies to cats and dogs.  Previously had only been on as needed albuterol but as noted  was started on Breztri by primary care.  Since he started rinsing his mouth after use of Breztri he no longer has "sores" in his mouth.  He tells me that he had COVID in February 2021 and also noted in December 2021.  Thinks he may had had at one other time as well.  He was symptomatic with the COVID with regards to shortness of breath but did not require hospitalization.  He has known congestive heart failure with an EF of 20% and moderate mitral regurgitation had been on Jardiance but had to discontinue this medication due to cost which was prohibitive for him.  He was supposed to adjust his diuretic therapy however he continues to be on Lasix 40 mg daily with no adjustment.  Prior visit he had high-resolution chest CT and 2D echo ordered.      Studies: High-resolution chest CT was performed on 15 August 2021 consistent with mild UIP.  The echocardiogram was obtained on 14 August 2021 and LVEF is 25 to 30% with global hypokinesis of the left ventricle severely dilated left ventricular size left atrial size dilated moderate mitral regurgitation. PFTs were performed on 15 August 2021 the showed FEV1 of 2.29 L or 98% predicted, FVC of 2.88 L or 84% predicted, FEV1/FVC 80%, lung volumes mildly reduced, diffusion capacity moderately reduced.  There was response to bronchodilators with regards to decreased airway resistance.   The patient has not had chest pain.  He does note intermittent lower extremity edema.  He has not had any calf tenderness.  As noted his cough has been dry, no hemoptysis, cough markedly better on Breztri.  No fevers, chills or sweats.  No weight loss or anorexia.     Review of Systems A 10 point review of systems was performed and it is as noted above otherwise negative.  Patient Active Problem List   Diagnosis Date Noted   HFrEF (heart failure with reduced ejection fraction) ([place]) 12/02/2020   Cardiac syncope 09/22/2019   Respiratory tract infection due to COVID-19 virus 09/04/2019   Fibrosis of lung ([place]) 09/29/2018   Senile purpura ([place]) 09/29/2018   Atherosclerosis of aorta ([place]) 09/29/2018   Hardening of the aorta (main artery of  the heart) ([place]) 09/29/2018   Abnormal ECG 06/16/2018   Thoracic aortic aneurysm without rupture 05/28/2018   CHF (congestive heart failure), NYHA class III, chronic, systolic ([place]) 85/27/7824   BPH with obstruction/lower urinary tract symptoms 02/26/2018   Ischemic cardiomyopathy 06/11/2017   Benign essential HTN 05/22/2017   Bilateral carotid artery stenosis 05/22/2017   Neuropathy 01/28/2017   Right leg paresthesias 11/26/2016   Hypertension 08/27/2016   GERD (gastroesophageal reflux disease) 08/27/2016   Hyperglycemia 05/10/2016   History of nonmelanoma skin cancer  10/10/2015   Pain in right shoulder 09/13/2015   Osteoarthritis 07/06/2015   Lymphedema 05/30/2015   Other specified soft tissue disorders 05/30/2015   Hyperlipidemia 03/29/2015   Family history of type 2 diabetes mellitus in father 03/29/2015   DD (diverticular disease) 02/03/2015   Diverticulosis of intestine without perforation or abscess without bleeding 02/03/2015   DDD (degenerative disc disease), lumbar 03/03/2014   Intervertebral disc disorder with radiculopathy of lumbar region 03/03/2014   Lumbar radiculitis 03/03/2014   Lumbar stenosis with neurogenic claudication 03/03/2014   Arteriosclerosis of coronary artery 04/18/2012   S/P coronary artery stent placement 04/18/2012   Status post percutaneous transluminal coronary angioplasty 04/18/2012   Social History   Tobacco Use   Smoking status: Former    Packs/day: 0.25    Years: 15.00    Pack years: 3.75    Types: Cigarettes    Quit date: 05/16/1963    Years since quitting: 58.3   Smokeless tobacco: Never   Tobacco comments:    quit over 50 years ago- 08/08/2021  Substance Use Topics   Alcohol use: No   Allergies  Allergen Reactions   Gramineae Pollens Itching and Shortness Of Breath    Sneezing   Cat Hair Extract Itching    Watery eyes and sneezing   Breo Ellipta [Fluticasone Furoate-Vilanterol] Rash    Patient stated that it made his mouth break out   Rosuvastatin Rash and Other (See Comments)    No reaction per pt, He saw some information regarding Crestor and adverse effects    Current Meds  Medication Sig   acetaminophen (TYLENOL) 500 MG tablet Take 500 mg by mouth every 6 (six) hours as needed.   albuterol (VENTOLIN HFA) 108 (90 Base) MCG/ACT inhaler USE 2 INHALATIONS BY MOUTH EVERY 6 HOURS AS NEEDED FOR WHEEZING OR SHORTNESS OF BREATH   aspirin EC 81 MG tablet Take 81 mg by mouth daily.    atorvastatin (LIPITOR) 10 MG tablet Take 1 tablet (10 mg total) by mouth daily.   Budeson-Glycopyrrol-Formoterol  (BREZTRI AEROSPHERE) 160-9-4.8 MCG/ACT AERO Inhale 2 puffs into the lungs 2 (two) times daily.   Budeson-Glycopyrrol-Formoterol (BREZTRI AEROSPHERE) 160-9-4.8 MCG/ACT AERO Inhale 2 puffs into the lungs in the morning and at bedtime.   chlorpheniramine-HYDROcodone (TUSSIONEX PENNKINETIC ER) 10-8 MG/5ML Take 5 mLs by mouth every 12 (twelve) hours as needed.   dapagliflozin propanediol (FARXIGA) 10 MG TABS tablet Take by mouth daily.   furosemide (LASIX) 40 MG tablet Take 40 mg by mouth in the morning.   montelukast (SINGULAIR) 10 MG tablet Take 1 tablet (10 mg total) by mouth at bedtime.   potassium chloride SA (KLOR-CON) 20 MEQ tablet TAKE 1 TABLET BY MOUTH  DAILY   Probiotic Product (PROBIOTIC PO) Take by mouth daily.    tamsulosin (FLOMAX) 0.4 MG CAPS capsule TAKE 1 CAPSULE BY MOUTH  DAILY   Immunization History  Administered Date(s) Administered   Fluad Quad(high Dose 65+) 08/17/2019, 05/25/2021   Influenza, High  Dose Seasonal PF 03/29/2015, 06/15/2016, 05/28/2018   Influenza-Unspecified 05/23/2017, 10/23/2018, 07/15/2020   PFIZER(Purple Top)SARS-COV-2 Vaccination 10/26/2019, 11/18/2019, 07/15/2020   Pneumococcal Conjugate-13 06/11/2016   Pneumococcal Polysaccharide-23 11/21/2017   Tdap 07/28/2017   Zoster Recombinat (Shingrix) 01/12/2021, 04/14/2021   Zoster, Live 05/07/2007        Objective:   Physical Exam BP 116/80 (BP Location: Left Arm, Patient Position: Sitting, Cuff Size: Normal)   Pulse 97   Temp (!) 97.5 F (36.4 C) (Oral)   Ht 5\' 9"  (1.753 m)   Wt 179 lb (81.2 kg)   SpO2 95%   BMI 26.43 kg/m  GENERAL: Well-developed well-nourished spry gentleman who looks much younger than his stated age.  Ambulating with the assistance of a cane. HEAD: Normocephalic, atraumatic.  EYES: Pupils equal, round, reactive to light.  No scleral icterus.  Mild periorbital edema. MOUTH: Nose/mouth/throat not examined due to masking requirements for COVID 19. NECK: Supple. No thyromegaly.  Trachea midline. No JVD.  No adenopathy. PULMONARY: Good air entry bilaterally.  No adventitious sounds. CARDIOVASCULAR: S1 and S2. Regular rate and rhythm.  Grade 2/6 to 3/6 systolic ejection murmur. ABDOMEN: Benign. MUSCULOSKELETAL: No joint deformity, no clubbing, 1-2+ pitting edema LEs.  NEUROLOGIC: No focal deficit, gait assisted with cane, speech is fluent. SKIN: Intact,warm,dry. PSYCH: Mood and behavior normal   Chest CT performed 15 August 2021 was reviewed independently and reviewed with the patient.  There is mild to moderate pulmonary fibrosis pattern consistent with UIP.  On my review this is more of a mild change.  Patient also had trace bilateral pleural effusions, coronary artery disease and aortic valve calcifications.     Assessment & Plan:     ICD-10-CM   1. Paroxysmal nocturnal dyspnea  R06.00 Pulse oximetry, overnight   Suspect cardiac origin (cardiomyopathy) Overnight oximetry Continue follow-up with cardiology    2. Dilated cardiomyopathy ([place])  I42.0    EF 25 to 30% Needs to continue follow-up with cardiology    3. Mitral valve insufficiency, unspecified etiology  I34.0    Continue follow-up with cardiology This issue adds complexity to his management    4. Postinflammatory pulmonary fibrosis (HCC)  J84.10    Possible mild UIP Prior history of COVID-19    5. Mild intermittent asthmatic bronchitis without complication  C62.37    Continue Breztri 2 puffs twice a day Rinse mouth well after use     Meds ordered this encounter  Medications   Budeson-Glycopyrrol-Formoterol (BREZTRI AEROSPHERE) 160-9-4.8 MCG/ACT AERO    Sig: Inhale 2 puffs into the lungs in the morning and at bedtime.    Dispense:  5.9 g    Refill:  0    Order Specific Question:   Lot Number?    Answer:   [id number]    Order Specific Question:   Expiration Date?    Answer:   03/29/2024    Order Specific Question:   Manufacturer?    Answer:   [employer] [[id number]]    Order Specific Question:    NDC    Answer:   7616-0737-10 [[id number]]    Order Specific Question:   Quantity    Answer:   2   The patient's main driver of symptoms is that of chronic congestive heart failure due to cardiomyopathy.  We will have overnight oximetry scheduled.  Patient was provided samples of Breztri.  We will see him in 2 months time he is to contact us prior to that time should any new difficulties arise.  Renold Don,  MD Advanced Bronchoscopy PCCM [facility]    *This note was dictated using voice recognition software/Dragon.  Despite best efforts to proofread, errors can occur which can change the meaning. Any transcriptional errors that result from this process are unintentional and may not be fully corrected at the time of dictation.

## 2021-09-19 DIAGNOSIS — Z7982 Long term (current) use of aspirin: Secondary | ICD-10-CM | POA: Diagnosis not present

## 2021-09-19 DIAGNOSIS — R918 Other nonspecific abnormal finding of lung field: Secondary | ICD-10-CM | POA: Diagnosis not present

## 2021-09-19 DIAGNOSIS — R0600 Dyspnea, unspecified: Secondary | ICD-10-CM | POA: Diagnosis not present

## 2021-09-19 DIAGNOSIS — R9431 Abnormal electrocardiogram [ECG] [EKG]: Secondary | ICD-10-CM | POA: Diagnosis not present

## 2021-09-19 DIAGNOSIS — I252 Old myocardial infarction: Secondary | ICD-10-CM | POA: Diagnosis not present

## 2021-09-19 DIAGNOSIS — E8770 Fluid overload, unspecified: Secondary | ICD-10-CM | POA: Diagnosis not present

## 2021-09-19 DIAGNOSIS — Z79899 Other long term (current) drug therapy: Secondary | ICD-10-CM | POA: Diagnosis not present

## 2021-09-19 DIAGNOSIS — Z955 Presence of coronary angioplasty implant and graft: Secondary | ICD-10-CM | POA: Diagnosis not present

## 2021-09-19 DIAGNOSIS — Z85820 Personal history of malignant melanoma of skin: Secondary | ICD-10-CM | POA: Diagnosis not present

## 2021-09-19 DIAGNOSIS — R0601 Orthopnea: Secondary | ICD-10-CM | POA: Diagnosis not present

## 2021-09-19 DIAGNOSIS — I110 Hypertensive heart disease with heart failure: Secondary | ICD-10-CM | POA: Diagnosis not present

## 2021-09-19 DIAGNOSIS — Z20822 Contact with and (suspected) exposure to covid-19: Secondary | ICD-10-CM | POA: Diagnosis not present

## 2021-09-19 DIAGNOSIS — E861 Hypovolemia: Secondary | ICD-10-CM | POA: Diagnosis not present

## 2021-09-19 DIAGNOSIS — Z87891 Personal history of nicotine dependence: Secondary | ICD-10-CM | POA: Diagnosis not present

## 2021-09-19 DIAGNOSIS — R0602 Shortness of breath: Secondary | ICD-10-CM | POA: Diagnosis not present

## 2021-09-19 DIAGNOSIS — I491 Atrial premature depolarization: Secondary | ICD-10-CM | POA: Diagnosis not present

## 2021-09-19 DIAGNOSIS — I509 Heart failure, unspecified: Secondary | ICD-10-CM | POA: Diagnosis not present

## 2021-09-19 DIAGNOSIS — Z85828 Personal history of other malignant neoplasm of skin: Secondary | ICD-10-CM | POA: Diagnosis not present

## 2021-09-19 DIAGNOSIS — R059 Cough, unspecified: Secondary | ICD-10-CM | POA: Diagnosis not present

## 2021-09-20 ENCOUNTER — Encounter: Payer: Self-pay | Admitting: Family Medicine

## 2021-09-20 NOTE — Telephone Encounter (Signed)
I called pts son Shanon Brow to get pt scheduled for an appt. Shanon Brow states that Dr Nash Shearer who works with Dr Nehemiah Massed had replied to the message and advised pt what to do. Pts son is reaching back out to that office to see if his dad needs a FU with them since that is his cardiologist

## 2021-09-21 ENCOUNTER — Other Ambulatory Visit: Payer: Self-pay | Admitting: Family Medicine

## 2021-09-21 DIAGNOSIS — J8410 Pulmonary fibrosis, unspecified: Secondary | ICD-10-CM

## 2021-09-21 DIAGNOSIS — R0600 Dyspnea, unspecified: Secondary | ICD-10-CM | POA: Diagnosis not present

## 2021-09-21 DIAGNOSIS — R053 Chronic cough: Secondary | ICD-10-CM

## 2021-09-21 MED ORDER — HYDROCOD POLI-CHLORPHE POLI ER 10-8 MG/5ML PO SUER: 5.0000 mL | Freq: Two times a day (BID) | ORAL | 0 refills | Status: DC | PRN

## 2021-09-25 ENCOUNTER — Encounter: Payer: Self-pay | Admitting: Pulmonary Disease

## 2021-09-28 ENCOUNTER — Telehealth: Payer: Self-pay | Admitting: Pulmonary Disease

## 2021-09-28 DIAGNOSIS — R0601 Orthopnea: Secondary | ICD-10-CM | POA: Diagnosis not present

## 2021-09-28 DIAGNOSIS — R0602 Shortness of breath: Secondary | ICD-10-CM | POA: Diagnosis not present

## 2021-09-28 DIAGNOSIS — B370 Candidal stomatitis: Secondary | ICD-10-CM | POA: Diagnosis not present

## 2021-09-28 DIAGNOSIS — R0789 Other chest pain: Secondary | ICD-10-CM | POA: Diagnosis not present

## 2021-09-28 DIAGNOSIS — R053 Chronic cough: Secondary | ICD-10-CM | POA: Diagnosis not present

## 2021-09-28 DIAGNOSIS — I5022 Chronic systolic (congestive) heart failure: Secondary | ICD-10-CM | POA: Diagnosis not present

## 2021-09-28 DIAGNOSIS — J8410 Pulmonary fibrosis, unspecified: Secondary | ICD-10-CM | POA: Diagnosis not present

## 2021-09-28 DIAGNOSIS — Z86718 Personal history of other venous thrombosis and embolism: Secondary | ICD-10-CM | POA: Diagnosis not present

## 2021-09-28 DIAGNOSIS — J811 Chronic pulmonary edema: Secondary | ICD-10-CM | POA: Diagnosis not present

## 2021-09-28 DIAGNOSIS — R059 Cough, unspecified: Secondary | ICD-10-CM | POA: Diagnosis not present

## 2021-09-28 DIAGNOSIS — J449 Chronic obstructive pulmonary disease, unspecified: Secondary | ICD-10-CM | POA: Diagnosis not present

## 2021-09-28 DIAGNOSIS — M48061 Spinal stenosis, lumbar region without neurogenic claudication: Secondary | ICD-10-CM | POA: Diagnosis not present

## 2021-09-28 DIAGNOSIS — I255 Ischemic cardiomyopathy: Secondary | ICD-10-CM | POA: Diagnosis not present

## 2021-09-28 DIAGNOSIS — I5023 Acute on chronic systolic (congestive) heart failure: Secondary | ICD-10-CM | POA: Diagnosis not present

## 2021-09-28 DIAGNOSIS — I10 Essential (primary) hypertension: Secondary | ICD-10-CM | POA: Diagnosis not present

## 2021-09-28 DIAGNOSIS — Z20822 Contact with and (suspected) exposure to covid-19: Secondary | ICD-10-CM | POA: Diagnosis not present

## 2021-09-28 DIAGNOSIS — Z87891 Personal history of nicotine dependence: Secondary | ICD-10-CM | POA: Diagnosis not present

## 2021-09-28 DIAGNOSIS — Z888 Allergy status to other drugs, medicaments and biological substances status: Secondary | ICD-10-CM | POA: Diagnosis not present

## 2021-09-28 DIAGNOSIS — I252 Old myocardial infarction: Secondary | ICD-10-CM | POA: Diagnosis not present

## 2021-09-28 DIAGNOSIS — K5900 Constipation, unspecified: Secondary | ICD-10-CM | POA: Diagnosis not present

## 2021-09-28 DIAGNOSIS — M791 Myalgia, unspecified site: Secondary | ICD-10-CM | POA: Diagnosis not present

## 2021-09-28 DIAGNOSIS — M1990 Unspecified osteoarthritis, unspecified site: Secondary | ICD-10-CM | POA: Diagnosis not present

## 2021-09-28 DIAGNOSIS — R54 Age-related physical debility: Secondary | ICD-10-CM | POA: Diagnosis not present

## 2021-09-28 DIAGNOSIS — R252 Cramp and spasm: Secondary | ICD-10-CM | POA: Diagnosis not present

## 2021-09-28 DIAGNOSIS — I429 Cardiomyopathy, unspecified: Secondary | ICD-10-CM | POA: Diagnosis not present

## 2021-09-28 DIAGNOSIS — K219 Gastro-esophageal reflux disease without esophagitis: Secondary | ICD-10-CM | POA: Diagnosis not present

## 2021-09-28 DIAGNOSIS — I2510 Atherosclerotic heart disease of native coronary artery without angina pectoris: Secondary | ICD-10-CM | POA: Diagnosis not present

## 2021-09-28 DIAGNOSIS — I5030 Unspecified diastolic (congestive) heart failure: Secondary | ICD-10-CM | POA: Diagnosis not present

## 2021-09-28 DIAGNOSIS — Z66 Do not resuscitate: Secondary | ICD-10-CM | POA: Diagnosis not present

## 2021-09-28 DIAGNOSIS — R000 Tachycardia, unspecified: Secondary | ICD-10-CM | POA: Diagnosis not present

## 2021-09-28 DIAGNOSIS — I509 Heart failure, unspecified: Secondary | ICD-10-CM | POA: Diagnosis not present

## 2021-09-28 DIAGNOSIS — J302 Other seasonal allergic rhinitis: Secondary | ICD-10-CM | POA: Diagnosis not present

## 2021-09-28 DIAGNOSIS — E785 Hyperlipidemia, unspecified: Secondary | ICD-10-CM | POA: Diagnosis not present

## 2021-09-28 DIAGNOSIS — I110 Hypertensive heart disease with heart failure: Secondary | ICD-10-CM | POA: Diagnosis not present

## 2021-09-28 DIAGNOSIS — R918 Other nonspecific abnormal finding of lung field: Secondary | ICD-10-CM | POA: Diagnosis not present

## 2021-09-28 DIAGNOSIS — N400 Enlarged prostate without lower urinary tract symptoms: Secondary | ICD-10-CM | POA: Diagnosis not present

## 2021-09-28 NOTE — Telephone Encounter (Signed)
ONO reviewed by Dr. Carron Brazen. Repeat ONO with 2L once setup.  ?She contacted cardiology about bradycardic. ? ?Patient's son, David(DPR) is aware of results and voiced his understanding.  ?Order placed for O2 and repeat ONO.  ?Nothing further needed.  ?  ?

## 2021-09-29 DIAGNOSIS — R000 Tachycardia, unspecified: Secondary | ICD-10-CM | POA: Insufficient documentation

## 2021-09-29 DIAGNOSIS — B370 Candidal stomatitis: Secondary | ICD-10-CM | POA: Insufficient documentation

## 2021-10-03 ENCOUNTER — Telehealth: Payer: Self-pay | Admitting: Pulmonary Disease

## 2021-10-03 NOTE — Telephone Encounter (Signed)
Spoke to patient's son, David(DPR). He would like oxygen to be delivered before patient is discharged.  ?Order was placed to [facility] on 09/28/2021. ?I have provided Shanon Brow with [employer]'s contact number. He will call [employer] for update. ?Nothing further needed.  ? ?

## 2021-10-04 ENCOUNTER — Telehealth: Payer: Self-pay | Admitting: Pulmonary Disease

## 2021-10-04 ENCOUNTER — Encounter: Payer: Self-pay | Admitting: Family Medicine

## 2021-10-04 DIAGNOSIS — J8410 Pulmonary fibrosis, unspecified: Secondary | ICD-10-CM

## 2021-10-04 DIAGNOSIS — J449 Chronic obstructive pulmonary disease, unspecified: Secondary | ICD-10-CM | POA: Diagnosis not present

## 2021-10-04 DIAGNOSIS — I5023 Acute on chronic systolic (congestive) heart failure: Secondary | ICD-10-CM | POA: Diagnosis not present

## 2021-10-04 DIAGNOSIS — I10 Essential (primary) hypertension: Secondary | ICD-10-CM | POA: Diagnosis not present

## 2021-10-04 DIAGNOSIS — I5030 Unspecified diastolic (congestive) heart failure: Secondary | ICD-10-CM | POA: Diagnosis not present

## 2021-10-04 DIAGNOSIS — I2510 Atherosclerotic heart disease of native coronary artery without angina pectoris: Secondary | ICD-10-CM | POA: Diagnosis not present

## 2021-10-04 MED ORDER — LEVALBUTEROL HCL 1.25 MG/3ML IN NEBU: 1.2500 mg | INHALATION_SOLUTION | Freq: Three times a day (TID) | RESPIRATORY_TRACT | 3 refills | Status: AC | PRN

## 2021-10-04 NOTE — Telephone Encounter (Signed)
Patient's son, David(DPR) called back with additional questions regarding POC. ?He is aware that patient will need to be qualified for daytime oxygen in order to be evaluated for POC. Our office has only prescribed nighttime oxygen. Patient is currently admitted at [facility]. Shanon Brow will reach out to the nurse to see if patient can be qualified.  ?Nothing further needed ?

## 2021-10-04 NOTE — Telephone Encounter (Signed)
Spoke to patient's son, David(DPR). ?Shanon Brow stated that patient will be discharged from [facility] today. [facility] will deliver oxygen today.  ?Patient has been receiving albuterol neb treatments during admission. Shanon Brow would like patient to continue neb treatments. Patient will need machine and meds. ? ?Dr. Patsey Berthold, please advise. Thanks ?

## 2021-10-04 NOTE — Telephone Encounter (Signed)
Spoke to patient's son, David(DPR) is aware that neb machine has been ordered.  ?He will call back if Rx is needed for solution.  ?Nothing further needed.  ? ? ?

## 2021-10-04 NOTE — Telephone Encounter (Signed)
Please refer to 10/03/2021 phone note.  ?Xopenex sent to preferred pharmacy.  ?Patient's son, David(DPR) is aware and voiced his understanding.  ?Nothing further needed.  ?

## 2021-10-04 NOTE — Telephone Encounter (Signed)
Okay for nebulizer machine.  Since there is an albuterol 6 shortage lets try Xopenex 1.25 mg, 1 vial 3 times a day as needed.  #90 with 3 refills. ?

## 2021-10-05 ENCOUNTER — Other Ambulatory Visit: Payer: Self-pay

## 2021-10-05 DIAGNOSIS — G629 Polyneuropathy, unspecified: Secondary | ICD-10-CM

## 2021-10-05 DIAGNOSIS — R0989 Other specified symptoms and signs involving the circulatory and respiratory systems: Secondary | ICD-10-CM

## 2021-10-05 DIAGNOSIS — M2041 Other hammer toe(s) (acquired), right foot: Secondary | ICD-10-CM

## 2021-10-05 DIAGNOSIS — J8410 Pulmonary fibrosis, unspecified: Secondary | ICD-10-CM | POA: Diagnosis not present

## 2021-10-05 DIAGNOSIS — I5022 Chronic systolic (congestive) heart failure: Secondary | ICD-10-CM

## 2021-10-05 DIAGNOSIS — M170 Bilateral primary osteoarthritis of knee: Secondary | ICD-10-CM

## 2021-10-05 DIAGNOSIS — J449 Chronic obstructive pulmonary disease, unspecified: Secondary | ICD-10-CM

## 2021-10-05 MED ORDER — TRANSPORT CHAIR MISC: 1.0000 | Freq: Once | 0 refills | Status: AC

## 2021-10-06 ENCOUNTER — Other Ambulatory Visit: Payer: Self-pay

## 2021-10-06 DIAGNOSIS — I5021 Acute systolic (congestive) heart failure: Secondary | ICD-10-CM | POA: Diagnosis not present

## 2021-10-06 DIAGNOSIS — J9622 Acute and chronic respiratory failure with hypercapnia: Secondary | ICD-10-CM | POA: Diagnosis not present

## 2021-10-06 DIAGNOSIS — Z20822 Contact with and (suspected) exposure to covid-19: Secondary | ICD-10-CM | POA: Diagnosis not present

## 2021-10-06 DIAGNOSIS — J9621 Acute and chronic respiratory failure with hypoxia: Secondary | ICD-10-CM | POA: Diagnosis not present

## 2021-10-06 DIAGNOSIS — M2041 Other hammer toe(s) (acquired), right foot: Secondary | ICD-10-CM

## 2021-10-06 DIAGNOSIS — R0600 Dyspnea, unspecified: Secondary | ICD-10-CM | POA: Diagnosis not present

## 2021-10-06 DIAGNOSIS — R918 Other nonspecific abnormal finding of lung field: Secondary | ICD-10-CM | POA: Diagnosis not present

## 2021-10-06 DIAGNOSIS — R0989 Other specified symptoms and signs involving the circulatory and respiratory systems: Secondary | ICD-10-CM

## 2021-10-06 DIAGNOSIS — M170 Bilateral primary osteoarthritis of knee: Secondary | ICD-10-CM

## 2021-10-06 DIAGNOSIS — J449 Chronic obstructive pulmonary disease, unspecified: Secondary | ICD-10-CM

## 2021-10-06 DIAGNOSIS — G629 Polyneuropathy, unspecified: Secondary | ICD-10-CM

## 2021-10-06 DIAGNOSIS — Z87891 Personal history of nicotine dependence: Secondary | ICD-10-CM | POA: Diagnosis not present

## 2021-10-06 DIAGNOSIS — I5022 Chronic systolic (congestive) heart failure: Secondary | ICD-10-CM

## 2021-10-06 DIAGNOSIS — R600 Localized edema: Secondary | ICD-10-CM

## 2021-10-06 DIAGNOSIS — R059 Cough, unspecified: Secondary | ICD-10-CM | POA: Diagnosis not present

## 2021-10-06 DIAGNOSIS — I110 Hypertensive heart disease with heart failure: Secondary | ICD-10-CM | POA: Diagnosis not present

## 2021-10-06 MED ORDER — TRANSPORT CHAIR MISC: 1.0000 | Freq: Once | 0 refills | Status: AC

## 2021-10-07 DIAGNOSIS — Z79899 Other long term (current) drug therapy: Secondary | ICD-10-CM | POA: Diagnosis not present

## 2021-10-07 DIAGNOSIS — K219 Gastro-esophageal reflux disease without esophagitis: Secondary | ICD-10-CM | POA: Diagnosis not present

## 2021-10-07 DIAGNOSIS — Z7901 Long term (current) use of anticoagulants: Secondary | ICD-10-CM | POA: Diagnosis not present

## 2021-10-07 DIAGNOSIS — R0609 Other forms of dyspnea: Secondary | ICD-10-CM | POA: Diagnosis not present

## 2021-10-07 DIAGNOSIS — J8410 Pulmonary fibrosis, unspecified: Secondary | ICD-10-CM | POA: Diagnosis not present

## 2021-10-07 DIAGNOSIS — I4891 Unspecified atrial fibrillation: Secondary | ICD-10-CM | POA: Diagnosis not present

## 2021-10-07 DIAGNOSIS — R0602 Shortness of breath: Secondary | ICD-10-CM | POA: Diagnosis not present

## 2021-10-07 DIAGNOSIS — I081 Rheumatic disorders of both mitral and tricuspid valves: Secondary | ICD-10-CM | POA: Diagnosis not present

## 2021-10-07 DIAGNOSIS — I480 Paroxysmal atrial fibrillation: Secondary | ICD-10-CM | POA: Diagnosis not present

## 2021-10-07 DIAGNOSIS — I5021 Acute systolic (congestive) heart failure: Secondary | ICD-10-CM | POA: Diagnosis not present

## 2021-10-07 DIAGNOSIS — R000 Tachycardia, unspecified: Secondary | ICD-10-CM | POA: Diagnosis not present

## 2021-10-07 DIAGNOSIS — I21A1 Myocardial infarction type 2: Secondary | ICD-10-CM | POA: Diagnosis not present

## 2021-10-07 DIAGNOSIS — Z20822 Contact with and (suspected) exposure to covid-19: Secondary | ICD-10-CM | POA: Diagnosis not present

## 2021-10-07 DIAGNOSIS — Z7983 Long term (current) use of bisphosphonates: Secondary | ICD-10-CM | POA: Diagnosis not present

## 2021-10-07 DIAGNOSIS — Z87891 Personal history of nicotine dependence: Secondary | ICD-10-CM | POA: Diagnosis not present

## 2021-10-07 DIAGNOSIS — R778 Other specified abnormalities of plasma proteins: Secondary | ICD-10-CM | POA: Diagnosis not present

## 2021-10-07 DIAGNOSIS — I2510 Atherosclerotic heart disease of native coronary artery without angina pectoris: Secondary | ICD-10-CM | POA: Diagnosis not present

## 2021-10-07 DIAGNOSIS — N179 Acute kidney failure, unspecified: Secondary | ICD-10-CM | POA: Diagnosis not present

## 2021-10-07 DIAGNOSIS — R570 Cardiogenic shock: Secondary | ICD-10-CM | POA: Diagnosis not present

## 2021-10-07 DIAGNOSIS — I5020 Unspecified systolic (congestive) heart failure: Secondary | ICD-10-CM | POA: Diagnosis not present

## 2021-10-07 DIAGNOSIS — R69 Illness, unspecified: Secondary | ICD-10-CM | POA: Diagnosis not present

## 2021-10-07 DIAGNOSIS — R059 Cough, unspecified: Secondary | ICD-10-CM | POA: Diagnosis not present

## 2021-10-07 DIAGNOSIS — J9621 Acute and chronic respiratory failure with hypoxia: Secondary | ICD-10-CM | POA: Diagnosis not present

## 2021-10-07 DIAGNOSIS — I5023 Acute on chronic systolic (congestive) heart failure: Secondary | ICD-10-CM | POA: Diagnosis not present

## 2021-10-07 DIAGNOSIS — Z515 Encounter for palliative care: Secondary | ICD-10-CM | POA: Diagnosis not present

## 2021-10-07 DIAGNOSIS — Z66 Do not resuscitate: Secondary | ICD-10-CM | POA: Diagnosis not present

## 2021-10-07 DIAGNOSIS — G4700 Insomnia, unspecified: Secondary | ICD-10-CM | POA: Diagnosis not present

## 2021-10-07 DIAGNOSIS — F419 Anxiety disorder, unspecified: Secondary | ICD-10-CM | POA: Diagnosis not present

## 2021-10-07 DIAGNOSIS — E8770 Fluid overload, unspecified: Secondary | ICD-10-CM | POA: Diagnosis not present

## 2021-10-07 DIAGNOSIS — I5084 End stage heart failure: Secondary | ICD-10-CM | POA: Diagnosis not present

## 2021-10-07 DIAGNOSIS — E785 Hyperlipidemia, unspecified: Secondary | ICD-10-CM | POA: Diagnosis not present

## 2021-10-07 DIAGNOSIS — R571 Hypovolemic shock: Secondary | ICD-10-CM | POA: Diagnosis not present

## 2021-10-07 DIAGNOSIS — I472 Ventricular tachycardia, unspecified: Secondary | ICD-10-CM | POA: Diagnosis not present

## 2021-10-07 DIAGNOSIS — I959 Hypotension, unspecified: Secondary | ICD-10-CM | POA: Diagnosis not present

## 2021-10-07 DIAGNOSIS — J449 Chronic obstructive pulmonary disease, unspecified: Secondary | ICD-10-CM | POA: Diagnosis not present

## 2021-10-07 DIAGNOSIS — I272 Pulmonary hypertension, unspecified: Secondary | ICD-10-CM | POA: Diagnosis not present

## 2021-10-07 DIAGNOSIS — I509 Heart failure, unspecified: Secondary | ICD-10-CM | POA: Diagnosis not present

## 2021-10-07 DIAGNOSIS — N400 Enlarged prostate without lower urinary tract symptoms: Secondary | ICD-10-CM | POA: Diagnosis not present

## 2021-10-07 DIAGNOSIS — I712 Thoracic aortic aneurysm, without rupture, unspecified: Secondary | ICD-10-CM | POA: Diagnosis not present

## 2021-10-07 DIAGNOSIS — N138 Other obstructive and reflux uropathy: Secondary | ICD-10-CM | POA: Diagnosis not present

## 2021-10-07 DIAGNOSIS — I4729 Other ventricular tachycardia: Secondary | ICD-10-CM | POA: Diagnosis not present

## 2021-10-07 DIAGNOSIS — I110 Hypertensive heart disease with heart failure: Secondary | ICD-10-CM | POA: Diagnosis not present

## 2021-10-07 DIAGNOSIS — E861 Hypovolemia: Secondary | ICD-10-CM | POA: Diagnosis not present

## 2021-10-07 DIAGNOSIS — J9622 Acute and chronic respiratory failure with hypercapnia: Secondary | ICD-10-CM | POA: Diagnosis not present

## 2021-10-07 DIAGNOSIS — I471 Supraventricular tachycardia: Secondary | ICD-10-CM | POA: Diagnosis not present

## 2021-10-07 DIAGNOSIS — I4892 Unspecified atrial flutter: Secondary | ICD-10-CM | POA: Diagnosis not present

## 2021-10-07 DIAGNOSIS — R918 Other nonspecific abnormal finding of lung field: Secondary | ICD-10-CM | POA: Diagnosis not present

## 2021-10-07 DIAGNOSIS — J810 Acute pulmonary edema: Secondary | ICD-10-CM | POA: Diagnosis not present

## 2021-10-08 DIAGNOSIS — I5020 Unspecified systolic (congestive) heart failure: Secondary | ICD-10-CM | POA: Diagnosis not present

## 2021-10-08 DIAGNOSIS — J8410 Pulmonary fibrosis, unspecified: Secondary | ICD-10-CM | POA: Diagnosis not present

## 2021-10-08 DIAGNOSIS — R000 Tachycardia, unspecified: Secondary | ICD-10-CM | POA: Diagnosis not present

## 2021-10-08 DIAGNOSIS — G4700 Insomnia, unspecified: Secondary | ICD-10-CM | POA: Diagnosis not present

## 2021-10-08 DIAGNOSIS — E785 Hyperlipidemia, unspecified: Secondary | ICD-10-CM | POA: Diagnosis not present

## 2021-10-08 DIAGNOSIS — K219 Gastro-esophageal reflux disease without esophagitis: Secondary | ICD-10-CM | POA: Diagnosis not present

## 2021-10-08 DIAGNOSIS — N400 Enlarged prostate without lower urinary tract symptoms: Secondary | ICD-10-CM | POA: Diagnosis not present

## 2021-10-08 DIAGNOSIS — I2510 Atherosclerotic heart disease of native coronary artery without angina pectoris: Secondary | ICD-10-CM | POA: Diagnosis not present

## 2021-10-08 DIAGNOSIS — R778 Other specified abnormalities of plasma proteins: Secondary | ICD-10-CM | POA: Diagnosis not present

## 2021-10-08 DIAGNOSIS — Z79899 Other long term (current) drug therapy: Secondary | ICD-10-CM | POA: Diagnosis not present

## 2021-10-09 DIAGNOSIS — R778 Other specified abnormalities of plasma proteins: Secondary | ICD-10-CM | POA: Diagnosis not present

## 2021-10-09 DIAGNOSIS — I2510 Atherosclerotic heart disease of native coronary artery without angina pectoris: Secondary | ICD-10-CM | POA: Diagnosis not present

## 2021-10-09 DIAGNOSIS — E785 Hyperlipidemia, unspecified: Secondary | ICD-10-CM | POA: Diagnosis not present

## 2021-10-09 DIAGNOSIS — K219 Gastro-esophageal reflux disease without esophagitis: Secondary | ICD-10-CM | POA: Diagnosis not present

## 2021-10-09 DIAGNOSIS — I272 Pulmonary hypertension, unspecified: Secondary | ICD-10-CM | POA: Diagnosis not present

## 2021-10-09 DIAGNOSIS — J8410 Pulmonary fibrosis, unspecified: Secondary | ICD-10-CM | POA: Diagnosis not present

## 2021-10-09 DIAGNOSIS — N400 Enlarged prostate without lower urinary tract symptoms: Secondary | ICD-10-CM | POA: Diagnosis not present

## 2021-10-09 DIAGNOSIS — Z79899 Other long term (current) drug therapy: Secondary | ICD-10-CM | POA: Diagnosis not present

## 2021-10-09 DIAGNOSIS — G4700 Insomnia, unspecified: Secondary | ICD-10-CM | POA: Diagnosis not present

## 2021-10-09 DIAGNOSIS — I081 Rheumatic disorders of both mitral and tricuspid valves: Secondary | ICD-10-CM | POA: Diagnosis not present

## 2021-10-09 DIAGNOSIS — I5020 Unspecified systolic (congestive) heart failure: Secondary | ICD-10-CM | POA: Diagnosis not present

## 2021-10-09 DIAGNOSIS — R000 Tachycardia, unspecified: Secondary | ICD-10-CM | POA: Diagnosis not present

## 2021-10-10 ENCOUNTER — Inpatient Hospital Stay: Payer: Medicare HMO | Admitting: Family Medicine

## 2021-10-10 DIAGNOSIS — K219 Gastro-esophageal reflux disease without esophagitis: Secondary | ICD-10-CM | POA: Diagnosis not present

## 2021-10-10 DIAGNOSIS — J8410 Pulmonary fibrosis, unspecified: Secondary | ICD-10-CM | POA: Diagnosis not present

## 2021-10-10 DIAGNOSIS — R000 Tachycardia, unspecified: Secondary | ICD-10-CM | POA: Diagnosis not present

## 2021-10-10 DIAGNOSIS — I2510 Atherosclerotic heart disease of native coronary artery without angina pectoris: Secondary | ICD-10-CM | POA: Diagnosis not present

## 2021-10-10 DIAGNOSIS — R778 Other specified abnormalities of plasma proteins: Secondary | ICD-10-CM | POA: Diagnosis not present

## 2021-10-10 DIAGNOSIS — J810 Acute pulmonary edema: Secondary | ICD-10-CM | POA: Diagnosis not present

## 2021-10-10 DIAGNOSIS — R0602 Shortness of breath: Secondary | ICD-10-CM | POA: Diagnosis not present

## 2021-10-10 DIAGNOSIS — I509 Heart failure, unspecified: Secondary | ICD-10-CM | POA: Diagnosis not present

## 2021-10-10 DIAGNOSIS — R918 Other nonspecific abnormal finding of lung field: Secondary | ICD-10-CM | POA: Diagnosis not present

## 2021-10-10 DIAGNOSIS — E785 Hyperlipidemia, unspecified: Secondary | ICD-10-CM | POA: Diagnosis not present

## 2021-10-10 DIAGNOSIS — N400 Enlarged prostate without lower urinary tract symptoms: Secondary | ICD-10-CM | POA: Diagnosis not present

## 2021-10-10 DIAGNOSIS — G4700 Insomnia, unspecified: Secondary | ICD-10-CM | POA: Diagnosis not present

## 2021-10-11 DIAGNOSIS — J810 Acute pulmonary edema: Secondary | ICD-10-CM | POA: Diagnosis not present

## 2021-10-11 DIAGNOSIS — J8410 Pulmonary fibrosis, unspecified: Secondary | ICD-10-CM | POA: Diagnosis not present

## 2021-10-11 DIAGNOSIS — R000 Tachycardia, unspecified: Secondary | ICD-10-CM | POA: Diagnosis not present

## 2021-10-11 DIAGNOSIS — K219 Gastro-esophageal reflux disease without esophagitis: Secondary | ICD-10-CM | POA: Diagnosis not present

## 2021-10-11 DIAGNOSIS — N400 Enlarged prostate without lower urinary tract symptoms: Secondary | ICD-10-CM | POA: Diagnosis not present

## 2021-10-11 DIAGNOSIS — R778 Other specified abnormalities of plasma proteins: Secondary | ICD-10-CM | POA: Diagnosis not present

## 2021-10-11 DIAGNOSIS — E785 Hyperlipidemia, unspecified: Secondary | ICD-10-CM | POA: Diagnosis not present

## 2021-10-11 DIAGNOSIS — I509 Heart failure, unspecified: Secondary | ICD-10-CM | POA: Diagnosis not present

## 2021-10-11 DIAGNOSIS — I2510 Atherosclerotic heart disease of native coronary artery without angina pectoris: Secondary | ICD-10-CM | POA: Diagnosis not present

## 2021-10-11 DIAGNOSIS — G4700 Insomnia, unspecified: Secondary | ICD-10-CM | POA: Diagnosis not present

## 2021-10-12 DIAGNOSIS — J810 Acute pulmonary edema: Secondary | ICD-10-CM | POA: Diagnosis not present

## 2021-10-12 DIAGNOSIS — I2510 Atherosclerotic heart disease of native coronary artery without angina pectoris: Secondary | ICD-10-CM | POA: Diagnosis not present

## 2021-10-12 DIAGNOSIS — I480 Paroxysmal atrial fibrillation: Secondary | ICD-10-CM | POA: Diagnosis not present

## 2021-10-12 DIAGNOSIS — I471 Supraventricular tachycardia: Secondary | ICD-10-CM | POA: Diagnosis not present

## 2021-10-12 DIAGNOSIS — I4891 Unspecified atrial fibrillation: Secondary | ICD-10-CM | POA: Diagnosis not present

## 2021-10-12 DIAGNOSIS — I5021 Acute systolic (congestive) heart failure: Secondary | ICD-10-CM | POA: Diagnosis not present

## 2021-10-12 DIAGNOSIS — E8770 Fluid overload, unspecified: Secondary | ICD-10-CM | POA: Diagnosis not present

## 2021-10-13 DIAGNOSIS — I5020 Unspecified systolic (congestive) heart failure: Secondary | ICD-10-CM | POA: Diagnosis not present

## 2021-10-13 DIAGNOSIS — I472 Ventricular tachycardia, unspecified: Secondary | ICD-10-CM | POA: Diagnosis not present

## 2021-10-13 DIAGNOSIS — I5021 Acute systolic (congestive) heart failure: Secondary | ICD-10-CM | POA: Diagnosis not present

## 2021-10-13 DIAGNOSIS — G4700 Insomnia, unspecified: Secondary | ICD-10-CM | POA: Diagnosis not present

## 2021-10-13 DIAGNOSIS — I471 Supraventricular tachycardia: Secondary | ICD-10-CM | POA: Diagnosis not present

## 2021-10-13 DIAGNOSIS — I4891 Unspecified atrial fibrillation: Secondary | ICD-10-CM | POA: Diagnosis not present

## 2021-10-14 DIAGNOSIS — I471 Supraventricular tachycardia: Secondary | ICD-10-CM | POA: Diagnosis not present

## 2021-10-14 DIAGNOSIS — I472 Ventricular tachycardia, unspecified: Secondary | ICD-10-CM | POA: Diagnosis not present

## 2021-10-14 DIAGNOSIS — G4700 Insomnia, unspecified: Secondary | ICD-10-CM | POA: Diagnosis not present

## 2021-10-14 DIAGNOSIS — I5021 Acute systolic (congestive) heart failure: Secondary | ICD-10-CM | POA: Diagnosis not present

## 2021-10-14 DIAGNOSIS — I4891 Unspecified atrial fibrillation: Secondary | ICD-10-CM | POA: Diagnosis not present

## 2021-10-14 DIAGNOSIS — I5020 Unspecified systolic (congestive) heart failure: Secondary | ICD-10-CM | POA: Diagnosis not present

## 2021-10-15 DIAGNOSIS — I5021 Acute systolic (congestive) heart failure: Secondary | ICD-10-CM | POA: Diagnosis not present

## 2021-10-15 DIAGNOSIS — I4891 Unspecified atrial fibrillation: Secondary | ICD-10-CM | POA: Diagnosis not present

## 2021-10-15 DIAGNOSIS — I471 Supraventricular tachycardia: Secondary | ICD-10-CM | POA: Diagnosis not present

## 2021-10-15 DIAGNOSIS — G4700 Insomnia, unspecified: Secondary | ICD-10-CM | POA: Diagnosis not present

## 2021-10-16 DIAGNOSIS — I5020 Unspecified systolic (congestive) heart failure: Secondary | ICD-10-CM | POA: Diagnosis not present

## 2021-10-16 DIAGNOSIS — I471 Supraventricular tachycardia: Secondary | ICD-10-CM | POA: Diagnosis not present

## 2021-10-16 DIAGNOSIS — I4891 Unspecified atrial fibrillation: Secondary | ICD-10-CM | POA: Diagnosis not present

## 2021-10-16 DIAGNOSIS — G4700 Insomnia, unspecified: Secondary | ICD-10-CM | POA: Diagnosis not present

## 2021-10-17 DIAGNOSIS — G4700 Insomnia, unspecified: Secondary | ICD-10-CM | POA: Diagnosis not present

## 2021-10-17 DIAGNOSIS — I471 Supraventricular tachycardia: Secondary | ICD-10-CM | POA: Diagnosis not present

## 2021-10-17 DIAGNOSIS — I4891 Unspecified atrial fibrillation: Secondary | ICD-10-CM | POA: Diagnosis not present

## 2021-10-17 DIAGNOSIS — I4729 Other ventricular tachycardia: Secondary | ICD-10-CM | POA: Diagnosis not present

## 2021-10-17 DIAGNOSIS — E861 Hypovolemia: Secondary | ICD-10-CM | POA: Diagnosis not present

## 2021-10-17 DIAGNOSIS — I5021 Acute systolic (congestive) heart failure: Secondary | ICD-10-CM | POA: Diagnosis not present

## 2021-10-17 DIAGNOSIS — R570 Cardiogenic shock: Secondary | ICD-10-CM | POA: Diagnosis not present

## 2021-10-18 DIAGNOSIS — R570 Cardiogenic shock: Secondary | ICD-10-CM | POA: Diagnosis not present

## 2021-10-18 DIAGNOSIS — I471 Supraventricular tachycardia: Secondary | ICD-10-CM | POA: Diagnosis not present

## 2021-10-18 DIAGNOSIS — I4891 Unspecified atrial fibrillation: Secondary | ICD-10-CM | POA: Diagnosis not present

## 2021-10-18 DIAGNOSIS — I4729 Other ventricular tachycardia: Secondary | ICD-10-CM | POA: Diagnosis not present

## 2021-10-18 DIAGNOSIS — E861 Hypovolemia: Secondary | ICD-10-CM | POA: Diagnosis not present

## 2021-10-18 DIAGNOSIS — G4700 Insomnia, unspecified: Secondary | ICD-10-CM | POA: Diagnosis not present

## 2021-10-18 DIAGNOSIS — I5021 Acute systolic (congestive) heart failure: Secondary | ICD-10-CM | POA: Diagnosis not present

## 2021-10-19 DIAGNOSIS — R69 Illness, unspecified: Secondary | ICD-10-CM | POA: Diagnosis not present

## 2021-10-19 DIAGNOSIS — I5021 Acute systolic (congestive) heart failure: Secondary | ICD-10-CM | POA: Diagnosis not present

## 2021-10-19 DIAGNOSIS — I4891 Unspecified atrial fibrillation: Secondary | ICD-10-CM | POA: Diagnosis not present

## 2021-10-19 DIAGNOSIS — G4700 Insomnia, unspecified: Secondary | ICD-10-CM | POA: Diagnosis not present

## 2021-10-20 ENCOUNTER — Ambulatory Visit: Payer: Medicare HMO | Admitting: Family Medicine

## 2021-10-20 DIAGNOSIS — R69 Illness, unspecified: Secondary | ICD-10-CM | POA: Diagnosis not present

## 2021-10-20 DIAGNOSIS — I4891 Unspecified atrial fibrillation: Secondary | ICD-10-CM | POA: Diagnosis not present

## 2021-10-20 DIAGNOSIS — I5021 Acute systolic (congestive) heart failure: Secondary | ICD-10-CM | POA: Diagnosis not present

## 2021-10-20 DIAGNOSIS — G4700 Insomnia, unspecified: Secondary | ICD-10-CM | POA: Diagnosis not present

## 2021-10-21 DIAGNOSIS — I5021 Acute systolic (congestive) heart failure: Secondary | ICD-10-CM | POA: Diagnosis not present

## 2021-10-21 DIAGNOSIS — R69 Illness, unspecified: Secondary | ICD-10-CM | POA: Diagnosis not present

## 2021-10-21 DIAGNOSIS — I4891 Unspecified atrial fibrillation: Secondary | ICD-10-CM | POA: Diagnosis not present

## 2021-10-21 DIAGNOSIS — G4700 Insomnia, unspecified: Secondary | ICD-10-CM | POA: Diagnosis not present

## 2021-10-22 DIAGNOSIS — J8410 Pulmonary fibrosis, unspecified: Secondary | ICD-10-CM | POA: Diagnosis not present

## 2021-10-22 DIAGNOSIS — K219 Gastro-esophageal reflux disease without esophagitis: Secondary | ICD-10-CM | POA: Diagnosis not present

## 2021-10-22 DIAGNOSIS — I5020 Unspecified systolic (congestive) heart failure: Secondary | ICD-10-CM | POA: Diagnosis not present

## 2021-10-22 DIAGNOSIS — I4892 Unspecified atrial flutter: Secondary | ICD-10-CM | POA: Diagnosis not present

## 2021-10-22 DIAGNOSIS — E785 Hyperlipidemia, unspecified: Secondary | ICD-10-CM | POA: Diagnosis not present

## 2021-10-22 DIAGNOSIS — G4700 Insomnia, unspecified: Secondary | ICD-10-CM | POA: Diagnosis not present

## 2021-10-22 DIAGNOSIS — N400 Enlarged prostate without lower urinary tract symptoms: Secondary | ICD-10-CM | POA: Diagnosis not present

## 2021-10-22 DIAGNOSIS — I2510 Atherosclerotic heart disease of native coronary artery without angina pectoris: Secondary | ICD-10-CM | POA: Diagnosis not present

## 2021-10-22 DIAGNOSIS — I4891 Unspecified atrial fibrillation: Secondary | ICD-10-CM | POA: Diagnosis not present

## 2021-10-23 DIAGNOSIS — I5021 Acute systolic (congestive) heart failure: Secondary | ICD-10-CM | POA: Diagnosis not present

## 2021-10-23 DIAGNOSIS — J9621 Acute and chronic respiratory failure with hypoxia: Secondary | ICD-10-CM | POA: Diagnosis not present

## 2021-10-23 DIAGNOSIS — I509 Heart failure, unspecified: Secondary | ICD-10-CM | POA: Diagnosis not present

## 2021-10-23 DIAGNOSIS — J9622 Acute and chronic respiratory failure with hypercapnia: Secondary | ICD-10-CM | POA: Diagnosis not present

## 2021-10-23 DIAGNOSIS — I471 Supraventricular tachycardia: Secondary | ICD-10-CM | POA: Diagnosis not present

## 2021-10-23 DIAGNOSIS — I480 Paroxysmal atrial fibrillation: Secondary | ICD-10-CM | POA: Diagnosis not present

## 2021-10-30 ENCOUNTER — Ambulatory Visit: Payer: Medicare HMO | Admitting: Family Medicine

## 2021-11-01 DIAGNOSIS — J9621 Acute and chronic respiratory failure with hypoxia: Secondary | ICD-10-CM | POA: Diagnosis not present

## 2021-11-01 DIAGNOSIS — J9622 Acute and chronic respiratory failure with hypercapnia: Secondary | ICD-10-CM | POA: Diagnosis not present

## 2021-11-02 ENCOUNTER — Telehealth: Payer: Self-pay

## 2021-11-02 NOTE — Telephone Encounter (Signed)
Transition Care Management Follow-up Telephone Call ?Date of discharge and from where: 11/01/21 [facility] ?How have you been since you were released from the hospital? Pt states he is doing okay; plans to move to [facility] assisted living ?Any questions or concerns? No ? ?Items Reviewed: ?Did the pt receive and understand the discharge instructions provided? Yes  ?Medications obtained and verified? Yes  ?Other? No  ?Any new allergies since your discharge? No  ?Dietary orders reviewed? Yes ?Do you have support at home? Yes  ? ?Home Care and Equipment/Supplies: ?Were home health services ordered? yes ?If so, what is the name of the agency? [facility]  ?Has the agency set up a time to come to the patient's home? yes ?Were any new equipment or medical supplies ordered?  Yes: Bedside commode ? ? ?Functional Questionnaire: (I = Independent and D = Dependent) ?ADLs: I with assistance ? ?Bathing/Dressing- I with assistance ? ?Meal Prep- D ? ?Eating- I ? ?Maintaining continence- i ? ?Transferring/Ambulation- I ? ?Managing Meds- I with assitance ? ?Follow up appointments reviewed: ? ?PCP Hospital f/u appt confirmed? No  Pt states moving to [facility] and anticipates new PCP there; will call to schedule if he is able to still see Dr. Ancil Boozer. ?[facility] f/u appt confirmed? No  Needs to see cardiology ?Are transportation arrangements needed? No  ?If their condition worsens, is the pt aware to call PCP or go to the Emergency Dept.? Yes ?Was the patient provided with contact information for the PCP's office or ED? Yes ?Was to pt encouraged to call back with questions or concerns? Yes ? ?

## 2021-11-03 DIAGNOSIS — I6523 Occlusion and stenosis of bilateral carotid arteries: Secondary | ICD-10-CM | POA: Diagnosis not present

## 2021-11-03 DIAGNOSIS — Z7951 Long term (current) use of inhaled steroids: Secondary | ICD-10-CM | POA: Diagnosis not present

## 2021-11-03 DIAGNOSIS — J8410 Pulmonary fibrosis, unspecified: Secondary | ICD-10-CM | POA: Diagnosis not present

## 2021-11-03 DIAGNOSIS — M5136 Other intervertebral disc degeneration, lumbar region: Secondary | ICD-10-CM | POA: Diagnosis not present

## 2021-11-03 DIAGNOSIS — I509 Heart failure, unspecified: Secondary | ICD-10-CM | POA: Diagnosis not present

## 2021-11-03 DIAGNOSIS — J45909 Unspecified asthma, uncomplicated: Secondary | ICD-10-CM | POA: Diagnosis not present

## 2021-11-03 DIAGNOSIS — I255 Ischemic cardiomyopathy: Secondary | ICD-10-CM | POA: Diagnosis not present

## 2021-11-03 DIAGNOSIS — I2510 Atherosclerotic heart disease of native coronary artery without angina pectoris: Secondary | ICD-10-CM | POA: Diagnosis not present

## 2021-11-03 DIAGNOSIS — N400 Enlarged prostate without lower urinary tract symptoms: Secondary | ICD-10-CM | POA: Diagnosis not present

## 2021-11-03 DIAGNOSIS — Z7901 Long term (current) use of anticoagulants: Secondary | ICD-10-CM | POA: Diagnosis not present

## 2021-11-03 DIAGNOSIS — I5023 Acute on chronic systolic (congestive) heart failure: Secondary | ICD-10-CM | POA: Diagnosis not present

## 2021-11-03 DIAGNOSIS — K219 Gastro-esophageal reflux disease without esophagitis: Secondary | ICD-10-CM | POA: Diagnosis not present

## 2021-11-03 DIAGNOSIS — E785 Hyperlipidemia, unspecified: Secondary | ICD-10-CM | POA: Diagnosis not present

## 2021-11-03 DIAGNOSIS — I4892 Unspecified atrial flutter: Secondary | ICD-10-CM | POA: Diagnosis not present

## 2021-11-03 DIAGNOSIS — I110 Hypertensive heart disease with heart failure: Secondary | ICD-10-CM | POA: Diagnosis not present

## 2021-11-03 DIAGNOSIS — I4891 Unspecified atrial fibrillation: Secondary | ICD-10-CM | POA: Diagnosis not present

## 2021-11-06 DIAGNOSIS — Z7951 Long term (current) use of inhaled steroids: Secondary | ICD-10-CM | POA: Diagnosis not present

## 2021-11-06 DIAGNOSIS — I5023 Acute on chronic systolic (congestive) heart failure: Secondary | ICD-10-CM | POA: Diagnosis not present

## 2021-11-06 DIAGNOSIS — E785 Hyperlipidemia, unspecified: Secondary | ICD-10-CM | POA: Diagnosis not present

## 2021-11-06 DIAGNOSIS — M5136 Other intervertebral disc degeneration, lumbar region: Secondary | ICD-10-CM | POA: Diagnosis not present

## 2021-11-06 DIAGNOSIS — I255 Ischemic cardiomyopathy: Secondary | ICD-10-CM | POA: Diagnosis not present

## 2021-11-06 DIAGNOSIS — I4891 Unspecified atrial fibrillation: Secondary | ICD-10-CM | POA: Diagnosis not present

## 2021-11-06 DIAGNOSIS — N400 Enlarged prostate without lower urinary tract symptoms: Secondary | ICD-10-CM | POA: Diagnosis not present

## 2021-11-06 DIAGNOSIS — I110 Hypertensive heart disease with heart failure: Secondary | ICD-10-CM | POA: Diagnosis not present

## 2021-11-06 DIAGNOSIS — I6523 Occlusion and stenosis of bilateral carotid arteries: Secondary | ICD-10-CM | POA: Diagnosis not present

## 2021-11-06 DIAGNOSIS — I2510 Atherosclerotic heart disease of native coronary artery without angina pectoris: Secondary | ICD-10-CM | POA: Diagnosis not present

## 2021-11-06 DIAGNOSIS — Z7901 Long term (current) use of anticoagulants: Secondary | ICD-10-CM | POA: Diagnosis not present

## 2021-11-06 DIAGNOSIS — K219 Gastro-esophageal reflux disease without esophagitis: Secondary | ICD-10-CM | POA: Diagnosis not present

## 2021-11-06 DIAGNOSIS — I4892 Unspecified atrial flutter: Secondary | ICD-10-CM | POA: Diagnosis not present

## 2021-11-06 DIAGNOSIS — J45909 Unspecified asthma, uncomplicated: Secondary | ICD-10-CM | POA: Diagnosis not present

## 2021-11-06 DIAGNOSIS — J8410 Pulmonary fibrosis, unspecified: Secondary | ICD-10-CM | POA: Diagnosis not present

## 2021-11-07 DIAGNOSIS — I2510 Atherosclerotic heart disease of native coronary artery without angina pectoris: Secondary | ICD-10-CM | POA: Diagnosis not present

## 2021-11-07 DIAGNOSIS — J45909 Unspecified asthma, uncomplicated: Secondary | ICD-10-CM | POA: Diagnosis not present

## 2021-11-07 DIAGNOSIS — M5136 Other intervertebral disc degeneration, lumbar region: Secondary | ICD-10-CM | POA: Diagnosis not present

## 2021-11-07 DIAGNOSIS — N400 Enlarged prostate without lower urinary tract symptoms: Secondary | ICD-10-CM | POA: Diagnosis not present

## 2021-11-07 DIAGNOSIS — I4891 Unspecified atrial fibrillation: Secondary | ICD-10-CM | POA: Diagnosis not present

## 2021-11-07 DIAGNOSIS — Z7901 Long term (current) use of anticoagulants: Secondary | ICD-10-CM | POA: Diagnosis not present

## 2021-11-07 DIAGNOSIS — I4892 Unspecified atrial flutter: Secondary | ICD-10-CM | POA: Diagnosis not present

## 2021-11-07 DIAGNOSIS — K219 Gastro-esophageal reflux disease without esophagitis: Secondary | ICD-10-CM | POA: Diagnosis not present

## 2021-11-07 DIAGNOSIS — E785 Hyperlipidemia, unspecified: Secondary | ICD-10-CM | POA: Diagnosis not present

## 2021-11-07 DIAGNOSIS — I5023 Acute on chronic systolic (congestive) heart failure: Secondary | ICD-10-CM | POA: Diagnosis not present

## 2021-11-07 DIAGNOSIS — I255 Ischemic cardiomyopathy: Secondary | ICD-10-CM | POA: Diagnosis not present

## 2021-11-07 DIAGNOSIS — I6523 Occlusion and stenosis of bilateral carotid arteries: Secondary | ICD-10-CM | POA: Diagnosis not present

## 2021-11-07 DIAGNOSIS — I110 Hypertensive heart disease with heart failure: Secondary | ICD-10-CM | POA: Diagnosis not present

## 2021-11-07 DIAGNOSIS — Z7951 Long term (current) use of inhaled steroids: Secondary | ICD-10-CM | POA: Diagnosis not present

## 2021-11-07 DIAGNOSIS — J8410 Pulmonary fibrosis, unspecified: Secondary | ICD-10-CM | POA: Diagnosis not present

## 2021-11-08 ENCOUNTER — Ambulatory Visit: Payer: Medicare HMO | Admitting: Pulmonary Disease

## 2021-11-09 DIAGNOSIS — Z7901 Long term (current) use of anticoagulants: Secondary | ICD-10-CM | POA: Diagnosis not present

## 2021-11-09 DIAGNOSIS — I2510 Atherosclerotic heart disease of native coronary artery without angina pectoris: Secondary | ICD-10-CM | POA: Diagnosis not present

## 2021-11-09 DIAGNOSIS — I110 Hypertensive heart disease with heart failure: Secondary | ICD-10-CM | POA: Diagnosis not present

## 2021-11-09 DIAGNOSIS — Z7951 Long term (current) use of inhaled steroids: Secondary | ICD-10-CM | POA: Diagnosis not present

## 2021-11-09 DIAGNOSIS — J45909 Unspecified asthma, uncomplicated: Secondary | ICD-10-CM | POA: Diagnosis not present

## 2021-11-09 DIAGNOSIS — I4891 Unspecified atrial fibrillation: Secondary | ICD-10-CM | POA: Diagnosis not present

## 2021-11-09 DIAGNOSIS — J8410 Pulmonary fibrosis, unspecified: Secondary | ICD-10-CM | POA: Diagnosis not present

## 2021-11-09 DIAGNOSIS — E785 Hyperlipidemia, unspecified: Secondary | ICD-10-CM | POA: Diagnosis not present

## 2021-11-09 DIAGNOSIS — I5023 Acute on chronic systolic (congestive) heart failure: Secondary | ICD-10-CM | POA: Diagnosis not present

## 2021-11-09 DIAGNOSIS — I4892 Unspecified atrial flutter: Secondary | ICD-10-CM | POA: Diagnosis not present

## 2021-11-09 DIAGNOSIS — I480 Paroxysmal atrial fibrillation: Secondary | ICD-10-CM | POA: Diagnosis not present

## 2021-11-09 DIAGNOSIS — I5022 Chronic systolic (congestive) heart failure: Secondary | ICD-10-CM | POA: Diagnosis not present

## 2021-11-09 DIAGNOSIS — I255 Ischemic cardiomyopathy: Secondary | ICD-10-CM | POA: Diagnosis not present

## 2021-11-09 DIAGNOSIS — N400 Enlarged prostate without lower urinary tract symptoms: Secondary | ICD-10-CM | POA: Diagnosis not present

## 2021-11-09 DIAGNOSIS — I6523 Occlusion and stenosis of bilateral carotid arteries: Secondary | ICD-10-CM | POA: Diagnosis not present

## 2021-11-09 DIAGNOSIS — K219 Gastro-esophageal reflux disease without esophagitis: Secondary | ICD-10-CM | POA: Diagnosis not present

## 2021-11-09 DIAGNOSIS — M5136 Other intervertebral disc degeneration, lumbar region: Secondary | ICD-10-CM | POA: Diagnosis not present

## 2021-11-13 ENCOUNTER — Ambulatory Visit: Payer: Self-pay | Admitting: *Deleted

## 2021-11-13 ENCOUNTER — Ambulatory Visit (INDEPENDENT_AMBULATORY_CARE_PROVIDER_SITE_OTHER): Payer: Medicare HMO | Admitting: Family Medicine

## 2021-11-13 ENCOUNTER — Encounter: Payer: Self-pay | Admitting: Family Medicine

## 2021-11-13 ENCOUNTER — Ambulatory Visit: Payer: Medicare HMO | Admitting: Pulmonary Disease

## 2021-11-13 VITALS — BP 100/62 | HR 89 | Resp 16 | Ht 69.0 in | Wt 163.3 lb

## 2021-11-13 DIAGNOSIS — R051 Acute cough: Secondary | ICD-10-CM | POA: Diagnosis not present

## 2021-11-13 MED ORDER — PREDNISONE 20 MG PO TABS: ORAL_TABLET | ORAL | 0 refills | Status: DC

## 2021-11-13 MED ORDER — IPRATROPIUM-ALBUTEROL 0.5-2.5 (3) MG/3ML IN SOLN: 3.0000 mL | Freq: Three times a day (TID) | RESPIRATORY_TRACT | 0 refills | Status: AC | PRN

## 2021-11-13 NOTE — Progress Notes (Signed)
? ? ?Patient ID: Mark [name], male    DOB: 1928/09/25, 88 y.o.   MRN: [id number] ? ?PCP: Steele Sizer, MD ? ?Chief Complaint  ?Patient presents with  ? Cough  ?  Constant cough, clear mucus pt believes may be related to to allergies. Pt did some yard work and forgot to wear his mask and believes pollen got to him.  ? Shortness of Breath  ? ? ?Subjective:  ? ?Mark [name] is a 88 y.o. male, presents to clinic with CC of the following: ? ?HPI  ? ?Pt here for the above CC - cough and nasal drainage started after working outdoors last weekend ?He is on Allergy pill, nasonex and mucinex regularly ?Started sneezing,nasal drainage, coughing ?No CP SOB wheeze, fever sweats ? ? ? ?Patient Active Problem List  ? Diagnosis Date Noted  ? Tachycardia 09/29/2021  ? Thrush 09/29/2021  ? HFrEF (heart failure with reduced ejection fraction) ([place]) 12/02/2020  ? Cardiac syncope 09/22/2019  ? Respiratory tract infection due to COVID-19 virus 09/04/2019  ? Fibrosis of lung ([place]) 09/29/2018  ? Senile purpura ([place]) 09/29/2018  ? Atherosclerosis of aorta ([place]) 09/29/2018  ? Hardening of the aorta (main artery of the heart) ([place]) 09/29/2018  ? Abnormal ECG 06/16/2018  ? Thoracic aortic aneurysm without rupture ([place]) 05/28/2018  ? CHF (congestive heart failure), NYHA class III, chronic, systolic ([place]) 26/94/8546  ? Acute on chronic systolic congestive heart failure ([place]) 05/28/2018  ? BPH with obstruction/lower urinary tract symptoms 02/26/2018  ? Ischemic cardiomyopathy 06/11/2017  ? Benign essential HTN 05/22/2017  ? Bilateral carotid artery stenosis 05/22/2017  ? Neuropathy 01/28/2017  ? Right leg paresthesias 11/26/2016  ? Hypertension 08/27/2016  ? GERD (gastroesophageal reflux disease) 08/27/2016  ? Hyperglycemia 05/10/2016  ? History of nonmelanoma skin cancer 10/10/2015  ? Pain in right shoulder 09/13/2015  ? Osteoarthritis 07/06/2015  ? Lymphedema 05/30/2015  ? Other specified soft tissue disorders 05/30/2015  ?  Hyperlipidemia 03/29/2015  ? Family history of type 2 diabetes mellitus in father 03/29/2015  ? DD (diverticular disease) 02/03/2015  ? Diverticulosis of intestine without perforation or abscess without bleeding 02/03/2015  ? DDD (degenerative disc disease), lumbar 03/03/2014  ? Intervertebral disc disorder with radiculopathy of lumbar region 03/03/2014  ? Lumbar radiculitis 03/03/2014  ? Lumbar stenosis with neurogenic claudication 03/03/2014  ? Arteriosclerosis of coronary artery 04/18/2012  ? S/P coronary artery stent placement 04/18/2012  ? Status post percutaneous transluminal coronary angioplasty 04/18/2012  ? ? ? ? ?Current Outpatient Medications:  ?  acetaminophen (TYLENOL) 500 MG tablet, Take 500 mg by mouth every 6 (six) hours as needed., Disp: , Rfl:  ?  albuterol (PROVENTIL) (2.5 MG/3ML) 0.083% nebulizer solution, Take by nebulization., Disp: , Rfl:  ?  albuterol (VENTOLIN HFA) 108 (90 Base) MCG/ACT inhaler, USE 2 INHALATIONS BY MOUTH EVERY 6 HOURS AS NEEDED FOR WHEEZING OR SHORTNESS OF BREATH, Disp: 8.5 g, Rfl: 0 ?  atorvastatin (LIPITOR) 10 MG tablet, Take 1 tablet (10 mg total) by mouth daily., Disp: 90 tablet, Rfl: 1 ?  bumetanide (BUMEX) 2 MG tablet, Take 1 tablet by mouth in the morning and at bedtime., Disp: , Rfl:  ?  digoxin (LANOXIN) 0.125 MG tablet, Take 125 mcg by mouth daily., Disp: , Rfl:  ?  Docusate Sodium (DSS) 100 MG CAPS, Take 2 capsules by mouth at bedtime as needed. For constipation, Disp: , Rfl:  ?  ELIQUIS 5 MG TABS tablet, Take 5 mg by mouth 2 (two) times  daily., Disp: , Rfl:  ?  famotidine (PEPCID) 10 MG tablet, Take by mouth., Disp: , Rfl:  ?  guaiFENesin (MUCINEX) 600 MG 12 hr tablet, Take by mouth., Disp: , Rfl:  ?  hydrOXYzine (ATARAX) 25 MG tablet, Take by mouth., Disp: , Rfl:  ?  levalbuterol (XOPENEX) 1.25 MG/3ML nebulizer solution, Take 1.25 mg by nebulization 3 (three) times daily as needed for wheezing., Disp: 90 mL, Rfl: 3 ?  loratadine (CLARITIN) 10 MG tablet, Take 1  tablet by mouth daily., Disp: , Rfl:  ?  montelukast (SINGULAIR) 10 MG tablet, Take 1 tablet (10 mg total) by mouth at bedtime., Disp: 90 tablet, Rfl: 1 ?  pantoprazole (PROTONIX) 40 MG tablet, Take 1 tablet by mouth daily., Disp: , Rfl:  ?  polyethylene glycol powder (GLYCOLAX/MIRALAX) 17 GM/SCOOP powder, Take 17 g by mouth daily as needed., Disp: , Rfl:  ?  Potassium Chloride ER 20 MEQ TBCR, Take 1 tablet by mouth 2 (two) times daily., Disp: , Rfl:  ?  senna (SENOKOT) 8.6 MG tablet, Take 2 tablets by mouth daily as needed., Disp: , Rfl:  ?  tamsulosin (FLOMAX) 0.4 MG CAPS capsule, TAKE 1 CAPSULE BY MOUTH  DAILY, Disp: 90 capsule, Rfl: 3 ?  traZODone (DESYREL) 150 MG tablet, Take 1 tablet by mouth at bedtime., Disp: , Rfl:  ?  Budeson-Glycopyrrol-Formoterol (BREZTRI AEROSPHERE) 160-9-4.8 MCG/ACT AERO, Inhale 2 puffs into the lungs 2 (two) times daily. (Patient not taking: Reported on 11/13/2021), Disp: 10.7 g, Rfl: 2 ?  diclofenac Sodium (VOLTAREN) 1 % GEL, Apply 1 application. topically 4 (four) times daily. PRN (Patient not taking: Reported on 11/13/2021), Disp: , Rfl:  ?  hydrocortisone cream 1 %, Apply topically. (Patient not taking: Reported on 11/13/2021), Disp: , Rfl:  ?  magnesium oxide (MAG-OX) 400 MG tablet, Take 1 tablet by mouth daily. (Patient not taking: Reported on 11/13/2021), Disp: , Rfl:  ?  melatonin 3 MG TABS tablet, Take 2 tablets by mouth every evening. (Patient not taking: Reported on 11/13/2021), Disp: , Rfl:  ?  Probiotic Product (PROBIOTIC PO), Take by mouth daily.  (Patient not taking: Reported on 11/13/2021), Disp: , Rfl:  ? ? ?Allergies  ?Allergen Reactions  ? Gramineae Pollens Itching and Shortness Of Breath  ?  Sneezing  ? Propranolol Other (See Comments)  ?  syncope  ? Cat Hair Extract Itching  ?  Watery eyes and sneezing  ? Breo Ellipta [Fluticasone Furoate-Vilanterol] Rash  ?  Patient stated that it made his mouth break out  ? Rosuvastatin Rash and Other (See Comments)  ?  No reaction  per pt, He saw some information regarding Crestor and adverse effects ?  ? ? ? ?Social History  ? ?Tobacco Use  ? Smoking status: Former  ?  Packs/day: 0.25  ?  Years: 15.00  ?  Pack years: 3.75  ?  Types: Cigarettes  ?  Quit date: 05/16/1963  ?  Years since quitting: 58.5  ? Smokeless tobacco: Never  ? Tobacco comments:  ?  quit over 50 years ago- 08/08/2021  ?Vaping Use  ? Vaping Use: Never used  ?Substance Use Topics  ? Alcohol use: No  ? Drug use: No  ?  ? ? ?Chart Review Today: ?I personally reviewed active problem list, medication list, allergies, family history, social history, health maintenance, notes from last encounter, lab results, imaging with the patient/caregiver today. ? ? ?Review of Systems  ?Constitutional: Negative.   ?HENT: Negative.    ?  Eyes: Negative.   ?Respiratory: Negative.    ?Cardiovascular: Negative.   ?Gastrointestinal: Negative.   ?Endocrine: Negative.   ?Genitourinary: Negative.   ?Musculoskeletal: Negative.   ?Skin: Negative.   ?Allergic/Immunologic: Negative.   ?Neurological: Negative.   ?Hematological: Negative.   ?Psychiatric/Behavioral: Negative.    ?All other systems reviewed and are negative. ? ?   ?Objective:  ? ?Vitals:  ? 11/13/21 0952  ?BP: 100/62  ?Pulse: 89  ?Resp: 16  ?SpO2: 96%  ?Weight: 163 lb 4.8 oz (74.1 kg)  ?Height: '5\' 9"'$  (1.753 m)  ?  ?Body mass index is 24.12 kg/m?. ? ?Physical Exam ?Vitals and nursing note reviewed.  ?Constitutional:   ?   General: He is not in acute distress. ?   Appearance: Normal appearance. He is not ill-appearing, toxic-appearing or diaphoretic.  ?   Comments: Elderly male, well appearing, NAD  ?HENT:  ?   Head: Normocephalic and atraumatic.  ?   Right Ear: External ear normal.  ?   Left Ear: External ear normal.  ?   Nose: Congestion and rhinorrhea present.  ?   Mouth/Throat:  ?   Mouth: Mucous membranes are moist.  ?   Pharynx: Oropharynx is clear. No oropharyngeal exudate or posterior oropharyngeal erythema.  ?Eyes:  ?   General: No  scleral icterus.    ?   Right eye: No discharge.     ?   Left eye: No discharge.  ?   Conjunctiva/sclera: Conjunctivae normal.  ?Cardiovascular:  ?   Rate and Rhythm: Normal rate and regular rhythm.  ?   Pulses: Normal pu

## 2021-11-13 NOTE — Telephone Encounter (Signed)
Reason for Disposition ? [1] Longstanding difficulty breathing (e.g., CHF, COPD, emphysema) AND [2] WORSE than normal ? ?Answer Assessment - Initial Assessment Questions ?1. RESPIRATORY STATUS: "Describe your breathing?" (e.g., wheezing, shortness of breath, unable to speak, severe coughing)  ?    I have a lung problem starts with a P.    I was in the hospital for 30 days with CHF.  Pulmonary fibrosis he asked son what it was. ?My ankles are not swollen, I'm taking the fluid pills.   I think the allergies are getting to me.   I mowed the other day and I started feeling it the next day coming on.   Coughing, tickling in throat and the cough is getting worse.   I've tried OTC Mucinex but it's not helping.   ?2. ONSET: "When did this breathing problem begin?"  ?    I think a week ago   Tuesday before last Tuesday.   ?3. PATTERN "Does the difficult breathing come and go, or has it been constant since it started?"  ?    Coughing day and night. ?Last night I took OTC cough medicine and went to sleep.    ?I'm coughing up clear mucus sometimes.   ?Denies chest pain or tightness.  I'm weighing myself every morning and I'm staying the same weight within a lb.   ?4. SEVERITY: "How bad is your breathing?" (e.g., mild, moderate, severe)  ?  - MILD: No SOB at rest, mild SOB with walking, speaks normally in sentences, can lie down, no retractions, pulse < 100.  ?  - MODERATE: SOB at rest, SOB with minimal exertion and prefers to sit, cannot lie down flat, speaks in phrases, mild retractions, audible wheezing, pulse 100-120.  ?  - SEVERE: Very SOB at rest, speaks in single words, struggling to breathe, sitting hunched forward, retractions, pulse > 120  ?    Minimal ?5. RECURRENT SYMPTOM: "Have you had difficulty breathing before?" If Yes, ask: "When was the last time?" and "What happened that time?"  ?    Was in hospital for 30 days with CHF recently ?6. CARDIAC HISTORY: "Do you have any history of heart disease?" (e.g., heart  attack, angina, bypass surgery, angioplasty)  ?    CHF ?7. LUNG HISTORY: "Do you have any history of lung disease?"  (e.g., pulmonary embolus, asthma, emphysema) ?    Pulmonary fibrosis ?8. CAUSE: "What do you think is causing the breathing problem?"  ?    The coughing.   "I don't feel it's my heart".   "No swelling in my ankles".   ?9. OTHER SYMPTOMS: "Do you have any other symptoms? (e.g., dizziness, runny nose, cough, chest pain, fever) ?    *No Answer* ?10. O2 SATURATION MONITOR:  "Do you use an oxygen saturation monitor (pulse oximeter) at home?" If Yes, "What is your reading (oxygen level) today?" "What is your usual oxygen saturation reading?" (e.g., 95%) ?      *No Answer* ?11. PREGNANCY: "Is there any chance you are pregnant?" "When was your last menstrual period?" ?      *No Answer* ?12. TRAVEL: "Have you traveled out of the country in the last month?" (e.g., travel history, exposures) ?      *No Answer* ? ?Protocols used: Breathing Difficulty-A-AH ? ?

## 2021-11-13 NOTE — Telephone Encounter (Signed)
?  Chief Complaint: coughing and tickling in throat, mild shortness of breath after mowing the yard.  Has pulmonary fibrosis and CHF.   In hospital recently for 30 days ?Symptoms: coughing mainly ?Frequency: day and night. ?Pertinent Negatives: Patient denies swelling in ankles or difficulty breathing.   Denies chest pain/tightness ?Disposition: '[]'$ ED /'[]'$ Urgent Care (no appt availability in office) / '[x]'$ Appointment(In office/virtual)/ '[]'$  [facility]/ '[]'$ Home Care/ '[]'$ Refused Recommended Disposition /'[]'$ [facility]/ '[]'$  Follow-up with PCP ?Additional Notes: Has appt this morning at 9:40 in office with Delsa Grana made my agent.  ?

## 2021-11-14 DIAGNOSIS — I2510 Atherosclerotic heart disease of native coronary artery without angina pectoris: Secondary | ICD-10-CM | POA: Diagnosis not present

## 2021-11-14 DIAGNOSIS — M5136 Other intervertebral disc degeneration, lumbar region: Secondary | ICD-10-CM | POA: Diagnosis not present

## 2021-11-14 DIAGNOSIS — J8410 Pulmonary fibrosis, unspecified: Secondary | ICD-10-CM | POA: Diagnosis not present

## 2021-11-14 DIAGNOSIS — K219 Gastro-esophageal reflux disease without esophagitis: Secondary | ICD-10-CM | POA: Diagnosis not present

## 2021-11-14 DIAGNOSIS — I110 Hypertensive heart disease with heart failure: Secondary | ICD-10-CM | POA: Diagnosis not present

## 2021-11-14 DIAGNOSIS — I5023 Acute on chronic systolic (congestive) heart failure: Secondary | ICD-10-CM | POA: Diagnosis not present

## 2021-11-14 DIAGNOSIS — I4891 Unspecified atrial fibrillation: Secondary | ICD-10-CM | POA: Diagnosis not present

## 2021-11-14 DIAGNOSIS — I480 Paroxysmal atrial fibrillation: Secondary | ICD-10-CM | POA: Insufficient documentation

## 2021-11-14 DIAGNOSIS — I6523 Occlusion and stenosis of bilateral carotid arteries: Secondary | ICD-10-CM | POA: Diagnosis not present

## 2021-11-14 DIAGNOSIS — N400 Enlarged prostate without lower urinary tract symptoms: Secondary | ICD-10-CM | POA: Diagnosis not present

## 2021-11-14 DIAGNOSIS — Z7951 Long term (current) use of inhaled steroids: Secondary | ICD-10-CM | POA: Diagnosis not present

## 2021-11-14 DIAGNOSIS — Z7901 Long term (current) use of anticoagulants: Secondary | ICD-10-CM | POA: Diagnosis not present

## 2021-11-14 DIAGNOSIS — I255 Ischemic cardiomyopathy: Secondary | ICD-10-CM | POA: Diagnosis not present

## 2021-11-14 DIAGNOSIS — J45909 Unspecified asthma, uncomplicated: Secondary | ICD-10-CM | POA: Diagnosis not present

## 2021-11-14 DIAGNOSIS — I4892 Unspecified atrial flutter: Secondary | ICD-10-CM | POA: Diagnosis not present

## 2021-11-14 DIAGNOSIS — E785 Hyperlipidemia, unspecified: Secondary | ICD-10-CM | POA: Diagnosis not present

## 2021-11-15 ENCOUNTER — Ambulatory Visit: Payer: Medicare HMO | Admitting: Dermatology

## 2021-11-15 DIAGNOSIS — M5136 Other intervertebral disc degeneration, lumbar region: Secondary | ICD-10-CM | POA: Diagnosis not present

## 2021-11-15 DIAGNOSIS — N400 Enlarged prostate without lower urinary tract symptoms: Secondary | ICD-10-CM | POA: Diagnosis not present

## 2021-11-15 DIAGNOSIS — Z7951 Long term (current) use of inhaled steroids: Secondary | ICD-10-CM | POA: Diagnosis not present

## 2021-11-15 DIAGNOSIS — I4891 Unspecified atrial fibrillation: Secondary | ICD-10-CM | POA: Diagnosis not present

## 2021-11-15 DIAGNOSIS — L570 Actinic keratosis: Secondary | ICD-10-CM | POA: Diagnosis not present

## 2021-11-15 DIAGNOSIS — E785 Hyperlipidemia, unspecified: Secondary | ICD-10-CM | POA: Diagnosis not present

## 2021-11-15 DIAGNOSIS — L821 Other seborrheic keratosis: Secondary | ICD-10-CM

## 2021-11-15 DIAGNOSIS — L299 Pruritus, unspecified: Secondary | ICD-10-CM | POA: Diagnosis not present

## 2021-11-15 DIAGNOSIS — J45909 Unspecified asthma, uncomplicated: Secondary | ICD-10-CM | POA: Diagnosis not present

## 2021-11-15 DIAGNOSIS — L820 Inflamed seborrheic keratosis: Secondary | ICD-10-CM | POA: Diagnosis not present

## 2021-11-15 DIAGNOSIS — I4892 Unspecified atrial flutter: Secondary | ICD-10-CM | POA: Diagnosis not present

## 2021-11-15 DIAGNOSIS — Z7901 Long term (current) use of anticoagulants: Secondary | ICD-10-CM | POA: Diagnosis not present

## 2021-11-15 DIAGNOSIS — I2510 Atherosclerotic heart disease of native coronary artery without angina pectoris: Secondary | ICD-10-CM | POA: Diagnosis not present

## 2021-11-15 DIAGNOSIS — K219 Gastro-esophageal reflux disease without esophagitis: Secondary | ICD-10-CM | POA: Diagnosis not present

## 2021-11-15 DIAGNOSIS — I5023 Acute on chronic systolic (congestive) heart failure: Secondary | ICD-10-CM | POA: Diagnosis not present

## 2021-11-15 DIAGNOSIS — L578 Other skin changes due to chronic exposure to nonionizing radiation: Secondary | ICD-10-CM

## 2021-11-15 DIAGNOSIS — J8410 Pulmonary fibrosis, unspecified: Secondary | ICD-10-CM | POA: Diagnosis not present

## 2021-11-15 DIAGNOSIS — I110 Hypertensive heart disease with heart failure: Secondary | ICD-10-CM | POA: Diagnosis not present

## 2021-11-15 DIAGNOSIS — I6523 Occlusion and stenosis of bilateral carotid arteries: Secondary | ICD-10-CM | POA: Diagnosis not present

## 2021-11-15 DIAGNOSIS — L2089 Other atopic dermatitis: Secondary | ICD-10-CM

## 2021-11-15 DIAGNOSIS — I255 Ischemic cardiomyopathy: Secondary | ICD-10-CM | POA: Diagnosis not present

## 2021-11-15 MED ORDER — FLUOROURACIL 5 % EX CREA: TOPICAL_CREAM | Freq: Two times a day (BID) | CUTANEOUS | 0 refills | Status: DC

## 2021-11-15 MED ORDER — MOMETASONE FUROATE 0.1 % EX CREA: 1.0000 "application " | TOPICAL_CREAM | Freq: Every day | CUTANEOUS | 0 refills | Status: DC

## 2021-11-15 NOTE — Progress Notes (Signed)
? ?Follow-Up Visit ?  ?Subjective  ?Mark [name] is a 88 y.o. male who presents for the following: Actinic Keratosis (6 month follow up of scalp - LN2 04/2021, PDT 05/2021). ?The patient has spots, moles and lesions to be evaluated, some may be new or changing and the patient has concerns that these could be cancer. ? ?The following portions of the chart were reviewed this encounter and updated as appropriate:  ? Tobacco  Allergies  Meds  Problems  Med Hx  Surg Hx  Fam Hx   ?  ?Review of Systems:  No other skin or systemic complaints except as noted in HPI or Assessment and Plan. ? ?Objective  ?Well appearing patient in no apparent distress; mood and affect are within normal limits. ? ?A focused examination was performed including scalp, face. Relevant physical exam findings are noted in the Assessment and Plan. ? ?Scalp x 15, face/ears x 5 (20) ?Erythematous thin papules/macules with gritty scale.  ? ?Neck - Posterior ?No evident rash today ? ?Left Ear ?Erythematous stuck-on, waxy papule or plaque ? ? ?Assessment & Plan  ? ?Actinic Damage - Severe, confluent actinic changes with pre-cancerous actinic keratoses  ?- Severe, chronic, not at goal, secondary to cumulative UV radiation exposure over time ?- diffuse scaly erythematous macules and papules with underlying dyspigmentation ?- Discussed Prescription "Field Treatment" for Severe, Chronic Confluent Actinic Changes with Pre-Cancerous Actinic Keratoses ?Field treatment involves treatment of an entire area of skin that has confluent Actinic Changes (Sun/ Ultraviolet light damage) and PreCancerous Actinic Keratoses by method of PhotoDynamic Therapy (PDT) and/or prescription Topical Chemotherapy agents such as 5-fluorouracil, 5-fluorouracil/calcipotriene, and/or imiquimod.  The purpose is to decrease the number of clinically evident and subclinical PreCancerous lesions to prevent progression to development of skin cancer by chemically destroying early  precancer changes that may or may not be visible.  It has been shown to reduce the risk of developing skin cancer in the treated area. As a result of treatment, redness, scaling, crusting, and open sores may occur during treatment course. One or more than one of these methods may be used and may have to be used several times to control, suppress and eliminate the PreCancerous changes. Discussed treatment course, expected reaction, and possible side effects. ?- Recommend daily broad spectrum sunscreen SPF 30+ to sun-exposed areas, reapply every 2 hours as needed.  ?- Staying in the shade or wearing long sleeves, sun glasses (UVA+UVB protection) and wide brim hats (4-inch brim around the entire circumference of the hat) are also recommended. ?- Call for new or changing lesions. ? ?Seborrheic Keratoses ?- Stuck-on, waxy, tan-brown papules and/or plaques  ?- Benign-appearing ?- Discussed benign etiology and prognosis. ?- Observe ?- Call for any changes ? ?AK (actinic keratosis) (20) ?Scalp x 15, face/ears x 5 ?Start Fluoruracil 5%/Calcipotriene cream bid x 1 week to scalp - start Dec 20, 2021 ? ?Destruction of lesion - Scalp x 15, face/ears x 5 ?Complexity: simple   ?Destruction method: cryotherapy   ?Informed consent: discussed and consent obtained   ?Timeout:  patient name, date of birth, surgical site, and procedure verified ?Lesion destroyed using liquid nitrogen: Yes   ?Region frozen until ice ball extended beyond lesion: Yes   ?Outcome: patient tolerated procedure well with no complications   ?Post-procedure details: wound care instructions given   ? ?fluorouracil (EFUDEX) 5 % cream - Scalp x 15, face/ears x 5 ?Apply topically 2 (two) times daily. To scalp x 1 week - start Dec 20, 2021 ? ?  Pruritus /atopic dermatitis ?Neck - Posterior ?Start Mometasone cream qd 5 times per week for rash and itch of neck ?mometasone (ELOCON) 0.1 % cream - Neck - Posterior ?Apply 1 application. topically daily. 5 times per week to  rash and itch of neck ? ?Inflamed seborrheic keratosis ?Left Ear ?Destruction of lesion - Left Ear ?Complexity: simple   ?Destruction method: cryotherapy   ?Informed consent: discussed and consent obtained   ?Timeout:  patient name, date of birth, surgical site, and procedure verified ?Lesion destroyed using liquid nitrogen: Yes   ?Region frozen until ice ball extended beyond lesion: Yes   ?Outcome: patient tolerated procedure well with no complications   ?Post-procedure details: wound care instructions given   ? ?Actinic Damage ?- chronic, secondary to cumulative UV radiation exposure/sun exposure over time ?- diffuse scaly erythematous macules with underlying dyspigmentation ?- Recommend daily broad spectrum sunscreen SPF 30+ to sun-exposed areas, reapply every 2 hours as needed.  ?- Recommend staying in the shade or wearing long sleeves, sun glasses (UVA+UVB protection) and wide brim hats (4-inch brim around the entire circumference of the hat). ?- Call for new or changing lesions. ? ?Return in about 3 months (around 02/14/2022) for AK follow up. ? ?I, Ashok Cordia, CMA, am acting as scribe for Sarina Ser, MD . ?Documentation: I have reviewed the above documentation for accuracy and completeness, and I agree with the above. ? ?Sarina Ser, MD ? ?

## 2021-11-15 NOTE — Patient Instructions (Addendum)

## 2021-11-16 DIAGNOSIS — Z7951 Long term (current) use of inhaled steroids: Secondary | ICD-10-CM | POA: Diagnosis not present

## 2021-11-16 DIAGNOSIS — I4891 Unspecified atrial fibrillation: Secondary | ICD-10-CM | POA: Diagnosis not present

## 2021-11-16 DIAGNOSIS — I4892 Unspecified atrial flutter: Secondary | ICD-10-CM | POA: Diagnosis not present

## 2021-11-16 DIAGNOSIS — M5136 Other intervertebral disc degeneration, lumbar region: Secondary | ICD-10-CM | POA: Diagnosis not present

## 2021-11-16 DIAGNOSIS — N400 Enlarged prostate without lower urinary tract symptoms: Secondary | ICD-10-CM | POA: Diagnosis not present

## 2021-11-16 DIAGNOSIS — Z7901 Long term (current) use of anticoagulants: Secondary | ICD-10-CM | POA: Diagnosis not present

## 2021-11-16 DIAGNOSIS — E785 Hyperlipidemia, unspecified: Secondary | ICD-10-CM | POA: Diagnosis not present

## 2021-11-16 DIAGNOSIS — I110 Hypertensive heart disease with heart failure: Secondary | ICD-10-CM | POA: Diagnosis not present

## 2021-11-16 DIAGNOSIS — I255 Ischemic cardiomyopathy: Secondary | ICD-10-CM | POA: Diagnosis not present

## 2021-11-16 DIAGNOSIS — J45909 Unspecified asthma, uncomplicated: Secondary | ICD-10-CM | POA: Diagnosis not present

## 2021-11-16 DIAGNOSIS — I2510 Atherosclerotic heart disease of native coronary artery without angina pectoris: Secondary | ICD-10-CM | POA: Diagnosis not present

## 2021-11-16 DIAGNOSIS — I5023 Acute on chronic systolic (congestive) heart failure: Secondary | ICD-10-CM | POA: Diagnosis not present

## 2021-11-16 DIAGNOSIS — K219 Gastro-esophageal reflux disease without esophagitis: Secondary | ICD-10-CM | POA: Diagnosis not present

## 2021-11-16 DIAGNOSIS — I6523 Occlusion and stenosis of bilateral carotid arteries: Secondary | ICD-10-CM | POA: Diagnosis not present

## 2021-11-16 DIAGNOSIS — J8410 Pulmonary fibrosis, unspecified: Secondary | ICD-10-CM | POA: Diagnosis not present

## 2021-11-20 DIAGNOSIS — I110 Hypertensive heart disease with heart failure: Secondary | ICD-10-CM | POA: Diagnosis not present

## 2021-11-20 DIAGNOSIS — I2510 Atherosclerotic heart disease of native coronary artery without angina pectoris: Secondary | ICD-10-CM | POA: Diagnosis not present

## 2021-11-20 DIAGNOSIS — I6523 Occlusion and stenosis of bilateral carotid arteries: Secondary | ICD-10-CM | POA: Diagnosis not present

## 2021-11-20 DIAGNOSIS — J8410 Pulmonary fibrosis, unspecified: Secondary | ICD-10-CM | POA: Diagnosis not present

## 2021-11-20 DIAGNOSIS — I5023 Acute on chronic systolic (congestive) heart failure: Secondary | ICD-10-CM | POA: Diagnosis not present

## 2021-11-20 DIAGNOSIS — E785 Hyperlipidemia, unspecified: Secondary | ICD-10-CM | POA: Diagnosis not present

## 2021-11-20 DIAGNOSIS — J45909 Unspecified asthma, uncomplicated: Secondary | ICD-10-CM | POA: Diagnosis not present

## 2021-11-20 DIAGNOSIS — I255 Ischemic cardiomyopathy: Secondary | ICD-10-CM | POA: Diagnosis not present

## 2021-11-20 DIAGNOSIS — I4891 Unspecified atrial fibrillation: Secondary | ICD-10-CM | POA: Diagnosis not present

## 2021-11-20 DIAGNOSIS — Z7951 Long term (current) use of inhaled steroids: Secondary | ICD-10-CM | POA: Diagnosis not present

## 2021-11-20 DIAGNOSIS — Z7901 Long term (current) use of anticoagulants: Secondary | ICD-10-CM | POA: Diagnosis not present

## 2021-11-20 DIAGNOSIS — N400 Enlarged prostate without lower urinary tract symptoms: Secondary | ICD-10-CM | POA: Diagnosis not present

## 2021-11-20 DIAGNOSIS — M5136 Other intervertebral disc degeneration, lumbar region: Secondary | ICD-10-CM | POA: Diagnosis not present

## 2021-11-20 DIAGNOSIS — K219 Gastro-esophageal reflux disease without esophagitis: Secondary | ICD-10-CM | POA: Diagnosis not present

## 2021-11-20 DIAGNOSIS — I4892 Unspecified atrial flutter: Secondary | ICD-10-CM | POA: Diagnosis not present

## 2021-11-21 ENCOUNTER — Ambulatory Visit: Payer: Self-pay

## 2021-11-21 DIAGNOSIS — J45909 Unspecified asthma, uncomplicated: Secondary | ICD-10-CM | POA: Diagnosis not present

## 2021-11-21 DIAGNOSIS — J8410 Pulmonary fibrosis, unspecified: Secondary | ICD-10-CM | POA: Diagnosis not present

## 2021-11-21 DIAGNOSIS — I110 Hypertensive heart disease with heart failure: Secondary | ICD-10-CM | POA: Diagnosis not present

## 2021-11-21 DIAGNOSIS — E785 Hyperlipidemia, unspecified: Secondary | ICD-10-CM | POA: Diagnosis not present

## 2021-11-21 DIAGNOSIS — I4891 Unspecified atrial fibrillation: Secondary | ICD-10-CM | POA: Diagnosis not present

## 2021-11-21 DIAGNOSIS — I4892 Unspecified atrial flutter: Secondary | ICD-10-CM | POA: Diagnosis not present

## 2021-11-21 DIAGNOSIS — I2510 Atherosclerotic heart disease of native coronary artery without angina pectoris: Secondary | ICD-10-CM | POA: Diagnosis not present

## 2021-11-21 DIAGNOSIS — M5136 Other intervertebral disc degeneration, lumbar region: Secondary | ICD-10-CM | POA: Diagnosis not present

## 2021-11-21 DIAGNOSIS — I255 Ischemic cardiomyopathy: Secondary | ICD-10-CM | POA: Diagnosis not present

## 2021-11-21 DIAGNOSIS — K219 Gastro-esophageal reflux disease without esophagitis: Secondary | ICD-10-CM | POA: Diagnosis not present

## 2021-11-21 DIAGNOSIS — Z7901 Long term (current) use of anticoagulants: Secondary | ICD-10-CM | POA: Diagnosis not present

## 2021-11-21 DIAGNOSIS — N400 Enlarged prostate without lower urinary tract symptoms: Secondary | ICD-10-CM | POA: Diagnosis not present

## 2021-11-21 DIAGNOSIS — I5023 Acute on chronic systolic (congestive) heart failure: Secondary | ICD-10-CM | POA: Diagnosis not present

## 2021-11-21 DIAGNOSIS — I6523 Occlusion and stenosis of bilateral carotid arteries: Secondary | ICD-10-CM | POA: Diagnosis not present

## 2021-11-21 DIAGNOSIS — Z7951 Long term (current) use of inhaled steroids: Secondary | ICD-10-CM | POA: Diagnosis not present

## 2021-11-22 NOTE — Progress Notes (Signed)
Name: Mark [name]   MRN: [id number]    DOB: [birth year]   Date:11/23/2021 ? ?     Progress Note ? ?Subjective ? ?Chief Complaint ? ?CPE/FL-2 Update ? ?HPI ? ?Patient presents for annual CPE and follow up ? ?PAF/CHF/pulmonary fibrosis/Atherosclerosis of Aorta: patients health has declined rapidly over the past few months. He had one fall at home, having to use a walker. SOB has increased, went to [facility] three times since January and admitted twice. He has pulmonary fibrosis, PAF,  CHF with EF 25-30 %, he has a history of CAD s/p PCI of mid LAD in 2005 and atherosclerosis of aorta. He is on statin therapy , digoxin since March, Eliquis and Bumex. He has malnutrition and has lost more weight over the past month  ? ?He came in with his son using a walker ? ?He is now too frail to live alone and brought papers to be filled out for placement at [facility].  ? ? ?IPSS Questionnaire (AUA-7): ?Over the past month?   ?1)  How often have you had a sensation of not emptying your bladder completely after you finish urinating?  1 - Less than 1 time in 5  ?2)  How often have you had to urinate again less than two hours after you finished urinating? 2 - Less than half the time  ?3)  How often have you found you stopped and started again several times when you urinated?  2 - Less than half the time  ?4) How difficult have you found it to postpone urination?  1 - Less than 1 time in 5  ?5) How often have you had a weak urinary stream?  3 - About half the time  ?6) How often have you had to push or strain to begin urination?  3 - About half the time  ?7) How many times did you most typically get up to urinate from the time you went to bed until the time you got up in the morning?  2 - 2 times  ?Total score:  0-7 mildly symptomatic  ? 8-19 moderately symptomatic  ? 20-35 severely symptomatic  ?  ? ?Diet: lack of appetite since on a low salt diet  ?Exercise: having SOB and cannot exercise lately  ? ?Depression: phq 9 is negative ? ?   11/23/2021  ? 11:21 AM 11/13/2021  ?  9:52 AM 08/23/2021  ?  1:49 PM 07/18/2021  ?  8:46 AM 07/04/2021  ?  9:51 AM  ?Depression screen PHQ 2/9  ?Decreased Interest 0 0 0 0 0  ?Down, Depressed, Hopeless 0 0 0 0 0  ?PHQ - 2 Score 0 0 0 0 0  ?Altered sleeping 0 0 0 0 0  ?Tired, decreased energy 0 0 0 0 0  ?Change in appetite 0 0 0 0 0  ?Feeling bad or failure about yourself  0 0 0 0 0  ?Trouble concentrating 0 0 0 0 0  ?Moving slowly or fidgety/restless 0 0 0 0 0  ?Suicidal thoughts 0 0 0 0 0  ?PHQ-9 Score 0 0 0 0 0  ?Difficult doing work/chores  Not difficult at all     ? ? ?Hypertension:  ?BP Readings from Last 3 Encounters:  ?11/23/21 110/68  ?11/13/21 100/62  ?09/12/21 116/80  ? ? ?Obesity: ?Wt Readings from Last 3 Encounters:  ?11/23/21 163 lb 14.4 oz (74.3 kg)  ?11/13/21 163 lb 4.8 oz (74.1 kg)  ?09/12/21 179 lb (81.2 kg)  ? ?  BMI Readings from Last 3 Encounters:  ?11/23/21 24.20 kg/m?  ?11/13/21 24.12 kg/m?  ?09/12/21 26.43 kg/m?  ?  ? ?Lipids:  ?Lab Results  ?Component Value Date  ? CHOL 142 12/30/2020  ? CHOL 121 12/25/2019  ? CHOL 120 02/12/2019  ? ?Lab Results  ?Component Value Date  ? HDL 48 12/30/2020  ? HDL 40 12/25/2019  ? HDL 39 (L) 02/12/2019  ? ?Lab Results  ?Component Value Date  ? [place] 79 12/30/2020  ? [place] 66 12/25/2019  ? [place] 64 02/12/2019  ? ?Lab Results  ?Component Value Date  ? TRIG 71 12/30/2020  ? TRIG 73 12/25/2019  ? TRIG 89 02/12/2019  ? ?Lab Results  ?Component Value Date  ? CHOLHDL 3.0 12/30/2020  ? CHOLHDL 3.0 12/25/2019  ? CHOLHDL 3.1 02/12/2019  ? ?No results found for: LDLDIRECT ?Glucose:  ?Glucose, Bld  ?Date Value Ref Range Status  ?07/14/2021 97 70 - 99 mg/dL Final  ?  Comment:  ?  Glucose reference range applies only to samples taken after fasting for at least 8 hours.  ?12/30/2020 122 (H) 65 - 99 mg/dL Final  ?  Comment:  ?  . ?           Fasting reference interval ?. ?For someone without known diabetes, a glucose value ?between 100 and 125 mg/dL is consistent  with ?prediabetes and should be confirmed with a ?follow-up test. ?. ?  ?12/25/2019 101 (H) 65 - 99 mg/dL Final  ?  Comment:  ?  . ?           Fasting reference interval ?. ?For someone without known diabetes, a glucose value ?between 100 and 125 mg/dL is consistent with ?prediabetes and should be confirmed with a ?follow-up test. ?. ?  ? ?Glucose-Capillary  ?Date Value Ref Range Status  ?09/14/2019 100 (H) 70 - 99 mg/dL Final  ? ? ?Flowsheet Row Clinical Support from 11/17/2020 in [facility]  ?AUDIT-C Score 0  ? ?  ? ? ? ?Widowed ?STD testing and prevention (HIV/chl/gon/syphilis):  not applicable ?Sexual history: no currently  ?Hep C Screening: up to date  ?Skin cancer: Discussed monitoring for atypical lesions - he sees dermatologist  ?Colorectal cancer: N/A ?Prostate cancer:  not applicable ? ? ?Vaccines:  ? ?Tdap: up to date ?Shingrix: up to date  ?Pneumonia: up to date  ?Flu: up to date  ?COVID-19:up to date  ? ?Advanced Care Planning: A voluntary discussion about advance care planning including the explanation and discussion of advance directives.  Discussed health care proxy and Living will, and the patient was able to identify a health care proxy as son Shanon Brow .  Patient does have a living will and power of attorney of health care  ? ?Patient Active Problem List  ? Diagnosis Date Noted  ? Mild protein-calorie malnutrition ([place]) 11/23/2021  ? 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 Paroxysmal atrial fibrillation ([place]) 11/14/2021  ? Tachycardia 09/29/2021  ? HFrEF (heart failure with reduced ejection fraction) ([place]) 12/02/2020  ? Cardiac syncope 09/22/2019  ? Fibrosis of lung ([place]) 09/29/2018  ? Senile purpura ([place]) 09/29/2018  ? Atherosclerosis of aorta ([place]) 09/29/2018  ? Hardening of the aorta (main artery of the heart) ([place]) 09/29/2018  ? Abnormal ECG 06/16/2018  ? Thoracic aortic aneurysm without rupture ([place]) 05/28/2018  ? CHF (congestive heart failure), NYHA class III, chronic, systolic  ([place]) 78/29/5621  ? Chronic HFrEF (heart failure with reduced ejection fraction) ([place]) 05/28/2018  ? BPH  with obstruction/lower urinary tract symptoms 02/26/2018  ? Ischemic cardiomyopathy 06/11/2017  ? Benign essential HTN 05/22/2017  ? Bilateral carotid artery stenosis 05/22/2017  ? Neuropathy 01/28/2017  ? Right leg paresthesias 11/26/2016  ? Hypertension 08/27/2016  ? GERD (gastroesophageal reflux disease) 08/27/2016  ? Hyperglycemia 05/10/2016  ? History of nonmelanoma skin cancer 10/10/2015  ? Pain in right shoulder 09/13/2015  ? Osteoarthritis 07/06/2015  ? Hyperlipidemia 03/29/2015  ? Family history of type 2 diabetes mellitus in father 03/29/2015  ? DD (diverticular disease) 02/03/2015  ? Diverticulosis of intestine without perforation or abscess without bleeding 02/03/2015  ? DDD (degenerative disc disease), lumbar 03/03/2014  ? Intervertebral disc disorder with radiculopathy of lumbar region 03/03/2014  ? Lumbar radiculitis 03/03/2014  ? Lumbar stenosis with neurogenic claudication 03/03/2014  ? Arteriosclerosis of coronary artery 04/18/2012  ? S/P coronary artery stent placement 04/18/2012  ? Status post percutaneous transluminal coronary angioplasty 04/18/2012  ? ? ?Past Surgical History:  ?Procedure Laterality Date  ? CATARACT EXTRACTION W/PHACO Right 11/24/2019  ? Procedure: CATARACT EXTRACTION PHACO AND INTRAOCULAR LENS PLACEMENT (IOC) RIGHT MALYUGIN 8.59 00:50.8;  Surgeon: Birder Robson, MD;  Location: [place];  Service: Ophthalmology;  Laterality: Right;  COVID ( + ) 09-01-19  ? CATARACT EXTRACTION W/PHACO Left 12/15/2019  ? Procedure: CATARACT EXTRACTION PHACO AND INTRAOCULAR LENS PLACEMENT (IOC) LEFT 9.90  01:03.7;  Surgeon: Birder Robson, MD;  Location: [facility];  Service: Ophthalmology;  Laterality: Left;  ? CORONARY ANGIOPLASTY WITH STENT PLACEMENT  2005  ? [facility]  ? ? ?Family History  ?Problem Relation Age of Onset  ? Hypertension Mother   ? Heart disease Mother   ?  Heart disease Father   ? Hypertension Father   ? ? ?Social History  ? ?Socioeconomic History  ? Marital status: Widowed  ?  Spouse name: Not on file  ? Number of children: 3  ? Years of education: Not on file  ? Highest

## 2021-11-23 ENCOUNTER — Encounter: Payer: Self-pay | Admitting: Family Medicine

## 2021-11-23 ENCOUNTER — Ambulatory Visit (INDEPENDENT_AMBULATORY_CARE_PROVIDER_SITE_OTHER): Payer: Medicare HMO | Admitting: Family Medicine

## 2021-11-23 ENCOUNTER — Telehealth: Payer: Self-pay

## 2021-11-23 VITALS — BP 110/68 | HR 74 | Temp 97.7°F | Resp 20 | Ht 69.0 in | Wt 163.9 lb

## 2021-11-23 DIAGNOSIS — I5023 Acute on chronic systolic (congestive) heart failure: Secondary | ICD-10-CM | POA: Diagnosis not present

## 2021-11-23 DIAGNOSIS — I480 Paroxysmal atrial fibrillation: Secondary | ICD-10-CM | POA: Diagnosis not present

## 2021-11-23 DIAGNOSIS — Z7901 Long term (current) use of anticoagulants: Secondary | ICD-10-CM | POA: Diagnosis not present

## 2021-11-23 DIAGNOSIS — Z0000 Encounter for general adult medical examination without abnormal findings: Secondary | ICD-10-CM

## 2021-11-23 DIAGNOSIS — K219 Gastro-esophageal reflux disease without esophagitis: Secondary | ICD-10-CM | POA: Diagnosis not present

## 2021-11-23 DIAGNOSIS — E785 Hyperlipidemia, unspecified: Secondary | ICD-10-CM | POA: Diagnosis not present

## 2021-11-23 DIAGNOSIS — R350 Frequency of micturition: Secondary | ICD-10-CM

## 2021-11-23 DIAGNOSIS — I6523 Occlusion and stenosis of bilateral carotid arteries: Secondary | ICD-10-CM | POA: Diagnosis not present

## 2021-11-23 DIAGNOSIS — I771 Stricture of artery: Secondary | ICD-10-CM | POA: Diagnosis not present

## 2021-11-23 DIAGNOSIS — M5136 Other intervertebral disc degeneration, lumbar region: Secondary | ICD-10-CM | POA: Diagnosis not present

## 2021-11-23 DIAGNOSIS — E7800 Pure hypercholesterolemia, unspecified: Secondary | ICD-10-CM

## 2021-11-23 DIAGNOSIS — J449 Chronic obstructive pulmonary disease, unspecified: Secondary | ICD-10-CM | POA: Diagnosis not present

## 2021-11-23 DIAGNOSIS — I2510 Atherosclerotic heart disease of native coronary artery without angina pectoris: Secondary | ICD-10-CM | POA: Diagnosis not present

## 2021-11-23 DIAGNOSIS — J8410 Pulmonary fibrosis, unspecified: Secondary | ICD-10-CM

## 2021-11-23 DIAGNOSIS — I700 Atherosclerosis of aorta: Secondary | ICD-10-CM | POA: Diagnosis not present

## 2021-11-23 DIAGNOSIS — J45909 Unspecified asthma, uncomplicated: Secondary | ICD-10-CM | POA: Diagnosis not present

## 2021-11-23 DIAGNOSIS — Z7951 Long term (current) use of inhaled steroids: Secondary | ICD-10-CM | POA: Diagnosis not present

## 2021-11-23 DIAGNOSIS — I4891 Unspecified atrial fibrillation: Secondary | ICD-10-CM | POA: Diagnosis not present

## 2021-11-23 DIAGNOSIS — N400 Enlarged prostate without lower urinary tract symptoms: Secondary | ICD-10-CM | POA: Diagnosis not present

## 2021-11-23 DIAGNOSIS — R54 Age-related physical debility: Secondary | ICD-10-CM | POA: Diagnosis not present

## 2021-11-23 DIAGNOSIS — E441 Mild protein-calorie malnutrition: Secondary | ICD-10-CM

## 2021-11-23 DIAGNOSIS — I255 Ischemic cardiomyopathy: Secondary | ICD-10-CM | POA: Diagnosis not present

## 2021-11-23 DIAGNOSIS — D692 Other nonthrombocytopenic purpura: Secondary | ICD-10-CM

## 2021-11-23 DIAGNOSIS — I110 Hypertensive heart disease with heart failure: Secondary | ICD-10-CM | POA: Diagnosis not present

## 2021-11-23 DIAGNOSIS — I4892 Unspecified atrial flutter: Secondary | ICD-10-CM | POA: Diagnosis not present

## 2021-11-23 DIAGNOSIS — Z111 Encounter for screening for respiratory tuberculosis: Secondary | ICD-10-CM | POA: Diagnosis not present

## 2021-11-23 LAB — POCT URINALYSIS DIPSTICK
Bilirubin, UA: NEGATIVE
Blood, UA: NEGATIVE
Glucose, UA: NEGATIVE
Ketones, UA: NEGATIVE
Leukocytes, UA: NEGATIVE
Nitrite, UA: NEGATIVE
Protein, UA: NEGATIVE
Spec Grav, UA: 1.01 (ref 1.010–1.025)
Urobilinogen, UA: 0.2 E.U./dL
pH, UA: 7 (ref 5.0–8.0)

## 2021-11-23 NOTE — Addendum Note (Signed)
Addended by: Carlene Coria on: 11/23/2021 03:04 PM ? ? Modules accepted: Orders ? ?

## 2021-11-23 NOTE — Telephone Encounter (Signed)
Patient called since being in office this am and states is much better! ?

## 2021-11-24 ENCOUNTER — Encounter: Payer: Self-pay | Admitting: Dermatology

## 2021-11-24 DIAGNOSIS — J45909 Unspecified asthma, uncomplicated: Secondary | ICD-10-CM | POA: Diagnosis not present

## 2021-11-24 DIAGNOSIS — I4892 Unspecified atrial flutter: Secondary | ICD-10-CM | POA: Diagnosis not present

## 2021-11-24 DIAGNOSIS — Z7951 Long term (current) use of inhaled steroids: Secondary | ICD-10-CM | POA: Diagnosis not present

## 2021-11-24 DIAGNOSIS — I4891 Unspecified atrial fibrillation: Secondary | ICD-10-CM | POA: Diagnosis not present

## 2021-11-24 DIAGNOSIS — J8410 Pulmonary fibrosis, unspecified: Secondary | ICD-10-CM | POA: Diagnosis not present

## 2021-11-24 DIAGNOSIS — Z7901 Long term (current) use of anticoagulants: Secondary | ICD-10-CM | POA: Diagnosis not present

## 2021-11-24 DIAGNOSIS — E785 Hyperlipidemia, unspecified: Secondary | ICD-10-CM | POA: Diagnosis not present

## 2021-11-24 DIAGNOSIS — I6523 Occlusion and stenosis of bilateral carotid arteries: Secondary | ICD-10-CM | POA: Diagnosis not present

## 2021-11-24 DIAGNOSIS — K219 Gastro-esophageal reflux disease without esophagitis: Secondary | ICD-10-CM | POA: Diagnosis not present

## 2021-11-24 DIAGNOSIS — N400 Enlarged prostate without lower urinary tract symptoms: Secondary | ICD-10-CM | POA: Diagnosis not present

## 2021-11-24 DIAGNOSIS — I110 Hypertensive heart disease with heart failure: Secondary | ICD-10-CM | POA: Diagnosis not present

## 2021-11-24 DIAGNOSIS — I2510 Atherosclerotic heart disease of native coronary artery without angina pectoris: Secondary | ICD-10-CM | POA: Diagnosis not present

## 2021-11-24 DIAGNOSIS — I255 Ischemic cardiomyopathy: Secondary | ICD-10-CM | POA: Diagnosis not present

## 2021-11-24 DIAGNOSIS — I5023 Acute on chronic systolic (congestive) heart failure: Secondary | ICD-10-CM | POA: Diagnosis not present

## 2021-11-24 DIAGNOSIS — M5136 Other intervertebral disc degeneration, lumbar region: Secondary | ICD-10-CM | POA: Diagnosis not present

## 2021-11-24 LAB — URINE CULTURE
MICRO NUMBER:: 13321814
SPECIMEN QUALITY:: ADEQUATE

## 2021-11-25 LAB — CBC WITH DIFFERENTIAL/PLATELET
Absolute Monocytes: 1154 cells/uL — ABNORMAL HIGH (ref 200–950)
Basophils Absolute: 22 cells/uL (ref 0–200)
Basophils Relative: 0.2 %
Eosinophils Absolute: 78 cells/uL (ref 15–500)
Eosinophils Relative: 0.7 %
HCT: 45.2 % (ref 38.5–50.0)
Hemoglobin: 15 g/dL (ref 13.2–17.1)
Lymphs Abs: 2453 cells/uL (ref 850–3900)
MCH: 31.6 pg (ref 27.0–33.0)
MCHC: 33.2 g/dL (ref 32.0–36.0)
MCV: 95.2 fL (ref 80.0–100.0)
MPV: 9.8 fL (ref 7.5–12.5)
Monocytes Relative: 10.4 %
Neutro Abs: 7393 cells/uL (ref 1500–7800)
Neutrophils Relative %: 66.6 %
Platelets: 314 10*3/uL (ref 140–400)
RBC: 4.75 10*6/uL (ref 4.20–5.80)
RDW: 11.6 % (ref 11.0–15.0)
Total Lymphocyte: 22.1 %
WBC: 11.1 10*3/uL — ABNORMAL HIGH (ref 3.8–10.8)

## 2021-11-25 LAB — QUANTIFERON-TB GOLD PLUS
Mitogen-NIL: >10 IU/mL
NIL: 0.06 IU/mL
QuantiFERON-TB Gold Plus: NEGATIVE
TB1-NIL: 0.01 IU/mL
TB2-NIL: 0.01 IU/mL

## 2021-11-25 LAB — COMPLETE METABOLIC PANEL WITH GFR
AG Ratio: 1.3 (calc) (ref 1.0–2.5)
ALT: 15 U/L (ref 9–46)
AST: 19 U/L (ref 10–35)
Albumin: 4 g/dL (ref 3.6–5.1)
Alkaline phosphatase (APISO): 79 U/L (ref 35–144)
BUN: 22 mg/dL (ref 7–25)
CO2: 26 mmol/L (ref 20–32)
Calcium: 9.6 mg/dL (ref 8.6–10.3)
Chloride: 98 mmol/L (ref 98–110)
Creat: 1.2 mg/dL (ref 0.70–1.22)
Globulin: 3 g/dL (calc) (ref 1.9–3.7)
Glucose, Bld: 109 mg/dL — ABNORMAL HIGH (ref 65–99)
Potassium: 5.3 mmol/L (ref 3.5–5.3)
Sodium: 137 mmol/L (ref 135–146)
Total Bilirubin: 1 mg/dL (ref 0.2–1.2)
Total Protein: 7 g/dL (ref 6.1–8.1)
eGFR: 57 mL/min/{1.73_m2} — ABNORMAL LOW (ref >=60)

## 2021-11-25 LAB — LIPID PANEL
Cholesterol: 159 mg/dL (ref <200)
HDL: 48 mg/dL (ref >=40)
LDL Cholesterol (Calc): 92 mg/dL (calc)
Non-HDL Cholesterol (Calc): 111 mg/dL (calc) (ref <130)
Total CHOL/HDL Ratio: 3.3 (calc) (ref <5.0)
Triglycerides: 101 mg/dL (ref <150)

## 2021-11-27 ENCOUNTER — Telehealth: Payer: Self-pay

## 2021-11-27 DIAGNOSIS — M5136 Other intervertebral disc degeneration, lumbar region: Secondary | ICD-10-CM | POA: Diagnosis not present

## 2021-11-27 DIAGNOSIS — I255 Ischemic cardiomyopathy: Secondary | ICD-10-CM | POA: Diagnosis not present

## 2021-11-27 DIAGNOSIS — J8410 Pulmonary fibrosis, unspecified: Secondary | ICD-10-CM | POA: Diagnosis not present

## 2021-11-27 DIAGNOSIS — I2510 Atherosclerotic heart disease of native coronary artery without angina pectoris: Secondary | ICD-10-CM | POA: Diagnosis not present

## 2021-11-27 DIAGNOSIS — I110 Hypertensive heart disease with heart failure: Secondary | ICD-10-CM | POA: Diagnosis not present

## 2021-11-27 DIAGNOSIS — K219 Gastro-esophageal reflux disease without esophagitis: Secondary | ICD-10-CM | POA: Diagnosis not present

## 2021-11-27 DIAGNOSIS — I4891 Unspecified atrial fibrillation: Secondary | ICD-10-CM | POA: Diagnosis not present

## 2021-11-27 DIAGNOSIS — J45909 Unspecified asthma, uncomplicated: Secondary | ICD-10-CM | POA: Diagnosis not present

## 2021-11-27 DIAGNOSIS — Z7951 Long term (current) use of inhaled steroids: Secondary | ICD-10-CM | POA: Diagnosis not present

## 2021-11-27 DIAGNOSIS — E785 Hyperlipidemia, unspecified: Secondary | ICD-10-CM | POA: Diagnosis not present

## 2021-11-27 DIAGNOSIS — I6523 Occlusion and stenosis of bilateral carotid arteries: Secondary | ICD-10-CM | POA: Diagnosis not present

## 2021-11-27 DIAGNOSIS — I4892 Unspecified atrial flutter: Secondary | ICD-10-CM | POA: Diagnosis not present

## 2021-11-27 DIAGNOSIS — I5023 Acute on chronic systolic (congestive) heart failure: Secondary | ICD-10-CM | POA: Diagnosis not present

## 2021-11-27 DIAGNOSIS — N400 Enlarged prostate without lower urinary tract symptoms: Secondary | ICD-10-CM | POA: Diagnosis not present

## 2021-11-27 DIAGNOSIS — Z7901 Long term (current) use of anticoagulants: Secondary | ICD-10-CM | POA: Diagnosis not present

## 2021-11-27 NOTE — Telephone Encounter (Signed)
Lvm letting son know we don't have a copy here ?

## 2021-11-27 NOTE — Telephone Encounter (Signed)
Have you received an FL2? ? ? ?Copied from [place] [phone]. Topic: General - Call Back - No Documentation ?>> Nov 27, 2021 10:06 AM Erick Blinks wrote: ?Reason for CRM: Pt's son called to see if the pt's FL2 forms have been sent to [facility] assisted living.  ? ? ?Best contact: [phone] ?

## 2021-11-28 DIAGNOSIS — I110 Hypertensive heart disease with heart failure: Secondary | ICD-10-CM | POA: Diagnosis not present

## 2021-11-28 DIAGNOSIS — I4892 Unspecified atrial flutter: Secondary | ICD-10-CM | POA: Diagnosis not present

## 2021-11-28 DIAGNOSIS — I255 Ischemic cardiomyopathy: Secondary | ICD-10-CM | POA: Diagnosis not present

## 2021-11-28 DIAGNOSIS — Z7951 Long term (current) use of inhaled steroids: Secondary | ICD-10-CM | POA: Diagnosis not present

## 2021-11-28 DIAGNOSIS — N400 Enlarged prostate without lower urinary tract symptoms: Secondary | ICD-10-CM | POA: Diagnosis not present

## 2021-11-28 DIAGNOSIS — I5023 Acute on chronic systolic (congestive) heart failure: Secondary | ICD-10-CM | POA: Diagnosis not present

## 2021-11-28 DIAGNOSIS — J8410 Pulmonary fibrosis, unspecified: Secondary | ICD-10-CM | POA: Diagnosis not present

## 2021-11-28 DIAGNOSIS — I4891 Unspecified atrial fibrillation: Secondary | ICD-10-CM | POA: Diagnosis not present

## 2021-11-28 DIAGNOSIS — K219 Gastro-esophageal reflux disease without esophagitis: Secondary | ICD-10-CM | POA: Diagnosis not present

## 2021-11-28 DIAGNOSIS — I6523 Occlusion and stenosis of bilateral carotid arteries: Secondary | ICD-10-CM | POA: Diagnosis not present

## 2021-11-28 DIAGNOSIS — E785 Hyperlipidemia, unspecified: Secondary | ICD-10-CM | POA: Diagnosis not present

## 2021-11-28 DIAGNOSIS — I2510 Atherosclerotic heart disease of native coronary artery without angina pectoris: Secondary | ICD-10-CM | POA: Diagnosis not present

## 2021-11-28 DIAGNOSIS — M5136 Other intervertebral disc degeneration, lumbar region: Secondary | ICD-10-CM | POA: Diagnosis not present

## 2021-11-28 DIAGNOSIS — Z7901 Long term (current) use of anticoagulants: Secondary | ICD-10-CM | POA: Diagnosis not present

## 2021-11-28 DIAGNOSIS — J45909 Unspecified asthma, uncomplicated: Secondary | ICD-10-CM | POA: Diagnosis not present

## 2021-12-01 ENCOUNTER — Telehealth: Payer: Self-pay

## 2021-12-01 DIAGNOSIS — Z7951 Long term (current) use of inhaled steroids: Secondary | ICD-10-CM | POA: Diagnosis not present

## 2021-12-01 DIAGNOSIS — I4891 Unspecified atrial fibrillation: Secondary | ICD-10-CM | POA: Diagnosis not present

## 2021-12-01 DIAGNOSIS — K219 Gastro-esophageal reflux disease without esophagitis: Secondary | ICD-10-CM | POA: Diagnosis not present

## 2021-12-01 DIAGNOSIS — Z7901 Long term (current) use of anticoagulants: Secondary | ICD-10-CM | POA: Diagnosis not present

## 2021-12-01 DIAGNOSIS — I110 Hypertensive heart disease with heart failure: Secondary | ICD-10-CM | POA: Diagnosis not present

## 2021-12-01 DIAGNOSIS — I6523 Occlusion and stenosis of bilateral carotid arteries: Secondary | ICD-10-CM | POA: Diagnosis not present

## 2021-12-01 DIAGNOSIS — I5023 Acute on chronic systolic (congestive) heart failure: Secondary | ICD-10-CM | POA: Diagnosis not present

## 2021-12-01 DIAGNOSIS — M5136 Other intervertebral disc degeneration, lumbar region: Secondary | ICD-10-CM | POA: Diagnosis not present

## 2021-12-01 DIAGNOSIS — N400 Enlarged prostate without lower urinary tract symptoms: Secondary | ICD-10-CM | POA: Diagnosis not present

## 2021-12-01 DIAGNOSIS — E785 Hyperlipidemia, unspecified: Secondary | ICD-10-CM | POA: Diagnosis not present

## 2021-12-01 DIAGNOSIS — I2510 Atherosclerotic heart disease of native coronary artery without angina pectoris: Secondary | ICD-10-CM | POA: Diagnosis not present

## 2021-12-01 DIAGNOSIS — J8410 Pulmonary fibrosis, unspecified: Secondary | ICD-10-CM | POA: Diagnosis not present

## 2021-12-01 DIAGNOSIS — I255 Ischemic cardiomyopathy: Secondary | ICD-10-CM | POA: Diagnosis not present

## 2021-12-01 DIAGNOSIS — I4892 Unspecified atrial flutter: Secondary | ICD-10-CM | POA: Diagnosis not present

## 2021-12-01 DIAGNOSIS — J45909 Unspecified asthma, uncomplicated: Secondary | ICD-10-CM | POA: Diagnosis not present

## 2021-12-01 NOTE — Telephone Encounter (Signed)
Copied from [place] [phone]. Topic: General - Call Back - No Documentation >> Dec 01, 2021  2:03 PM Scherrie Gerlach wrote: Reason for CRM: Vaughan Basta w/ [facility] states the pt med list was not signed by Dr Ancil Boozer.  Vaughan Basta will fax it to you now, please haave dr sign and date. Pt will be moving in next week. Nurse triage declined to go over.

## 2021-12-04 NOTE — Telephone Encounter (Signed)
Printed off patient medication list and Dr. Ancil Boozer signed both pages of the medication list. I faxed the list over to [facility] to the attention of Vaughan Basta @ [phone] on 12/04/21 @ 8:15 AM.

## 2021-12-05 DIAGNOSIS — J45909 Unspecified asthma, uncomplicated: Secondary | ICD-10-CM | POA: Diagnosis not present

## 2021-12-05 DIAGNOSIS — Z7901 Long term (current) use of anticoagulants: Secondary | ICD-10-CM | POA: Diagnosis not present

## 2021-12-05 DIAGNOSIS — I6523 Occlusion and stenosis of bilateral carotid arteries: Secondary | ICD-10-CM | POA: Diagnosis not present

## 2021-12-05 DIAGNOSIS — I5023 Acute on chronic systolic (congestive) heart failure: Secondary | ICD-10-CM | POA: Diagnosis not present

## 2021-12-05 DIAGNOSIS — J8410 Pulmonary fibrosis, unspecified: Secondary | ICD-10-CM | POA: Diagnosis not present

## 2021-12-05 DIAGNOSIS — I4892 Unspecified atrial flutter: Secondary | ICD-10-CM | POA: Diagnosis not present

## 2021-12-05 DIAGNOSIS — M5136 Other intervertebral disc degeneration, lumbar region: Secondary | ICD-10-CM | POA: Diagnosis not present

## 2021-12-05 DIAGNOSIS — Z7951 Long term (current) use of inhaled steroids: Secondary | ICD-10-CM | POA: Diagnosis not present

## 2021-12-05 DIAGNOSIS — K219 Gastro-esophageal reflux disease without esophagitis: Secondary | ICD-10-CM | POA: Diagnosis not present

## 2021-12-05 DIAGNOSIS — N400 Enlarged prostate without lower urinary tract symptoms: Secondary | ICD-10-CM | POA: Diagnosis not present

## 2021-12-05 DIAGNOSIS — I4891 Unspecified atrial fibrillation: Secondary | ICD-10-CM | POA: Diagnosis not present

## 2021-12-05 DIAGNOSIS — I255 Ischemic cardiomyopathy: Secondary | ICD-10-CM | POA: Diagnosis not present

## 2021-12-05 DIAGNOSIS — I2510 Atherosclerotic heart disease of native coronary artery without angina pectoris: Secondary | ICD-10-CM | POA: Diagnosis not present

## 2021-12-05 DIAGNOSIS — E785 Hyperlipidemia, unspecified: Secondary | ICD-10-CM | POA: Diagnosis not present

## 2021-12-05 DIAGNOSIS — I110 Hypertensive heart disease with heart failure: Secondary | ICD-10-CM | POA: Diagnosis not present

## 2021-12-07 ENCOUNTER — Telehealth: Payer: Self-pay | Admitting: Family Medicine

## 2021-12-07 DIAGNOSIS — K219 Gastro-esophageal reflux disease without esophagitis: Secondary | ICD-10-CM | POA: Diagnosis not present

## 2021-12-07 DIAGNOSIS — J45909 Unspecified asthma, uncomplicated: Secondary | ICD-10-CM | POA: Diagnosis not present

## 2021-12-07 DIAGNOSIS — I5023 Acute on chronic systolic (congestive) heart failure: Secondary | ICD-10-CM | POA: Diagnosis not present

## 2021-12-07 DIAGNOSIS — I255 Ischemic cardiomyopathy: Secondary | ICD-10-CM | POA: Diagnosis not present

## 2021-12-07 DIAGNOSIS — I2510 Atherosclerotic heart disease of native coronary artery without angina pectoris: Secondary | ICD-10-CM | POA: Diagnosis not present

## 2021-12-07 DIAGNOSIS — I110 Hypertensive heart disease with heart failure: Secondary | ICD-10-CM | POA: Diagnosis not present

## 2021-12-07 DIAGNOSIS — J449 Chronic obstructive pulmonary disease, unspecified: Secondary | ICD-10-CM | POA: Diagnosis not present

## 2021-12-07 DIAGNOSIS — E785 Hyperlipidemia, unspecified: Secondary | ICD-10-CM | POA: Diagnosis not present

## 2021-12-07 DIAGNOSIS — Z7951 Long term (current) use of inhaled steroids: Secondary | ICD-10-CM | POA: Diagnosis not present

## 2021-12-07 DIAGNOSIS — I509 Heart failure, unspecified: Secondary | ICD-10-CM | POA: Diagnosis not present

## 2021-12-07 DIAGNOSIS — M5136 Other intervertebral disc degeneration, lumbar region: Secondary | ICD-10-CM | POA: Diagnosis not present

## 2021-12-07 DIAGNOSIS — I4892 Unspecified atrial flutter: Secondary | ICD-10-CM | POA: Diagnosis not present

## 2021-12-07 DIAGNOSIS — R5381 Other malaise: Secondary | ICD-10-CM | POA: Diagnosis not present

## 2021-12-07 DIAGNOSIS — N400 Enlarged prostate without lower urinary tract symptoms: Secondary | ICD-10-CM | POA: Diagnosis not present

## 2021-12-07 DIAGNOSIS — I6523 Occlusion and stenosis of bilateral carotid arteries: Secondary | ICD-10-CM | POA: Diagnosis not present

## 2021-12-07 DIAGNOSIS — I480 Paroxysmal atrial fibrillation: Secondary | ICD-10-CM | POA: Diagnosis not present

## 2021-12-07 DIAGNOSIS — J302 Other seasonal allergic rhinitis: Secondary | ICD-10-CM | POA: Diagnosis not present

## 2021-12-07 DIAGNOSIS — G4700 Insomnia, unspecified: Secondary | ICD-10-CM | POA: Diagnosis not present

## 2021-12-07 DIAGNOSIS — M1990 Unspecified osteoarthritis, unspecified site: Secondary | ICD-10-CM | POA: Diagnosis not present

## 2021-12-07 DIAGNOSIS — J8410 Pulmonary fibrosis, unspecified: Secondary | ICD-10-CM | POA: Diagnosis not present

## 2021-12-07 DIAGNOSIS — I4891 Unspecified atrial fibrillation: Secondary | ICD-10-CM | POA: Diagnosis not present

## 2021-12-07 DIAGNOSIS — L309 Dermatitis, unspecified: Secondary | ICD-10-CM | POA: Diagnosis not present

## 2021-12-07 DIAGNOSIS — Z7901 Long term (current) use of anticoagulants: Secondary | ICD-10-CM | POA: Diagnosis not present

## 2021-12-07 NOTE — Telephone Encounter (Signed)
Opened in error

## 2021-12-10 DIAGNOSIS — I509 Heart failure, unspecified: Secondary | ICD-10-CM | POA: Diagnosis not present

## 2021-12-11 DIAGNOSIS — L570 Actinic keratosis: Secondary | ICD-10-CM | POA: Diagnosis not present

## 2021-12-11 DIAGNOSIS — L814 Other melanin hyperpigmentation: Secondary | ICD-10-CM | POA: Diagnosis not present

## 2021-12-11 DIAGNOSIS — L299 Pruritus, unspecified: Secondary | ICD-10-CM | POA: Diagnosis not present

## 2021-12-11 DIAGNOSIS — L821 Other seborrheic keratosis: Secondary | ICD-10-CM | POA: Diagnosis not present

## 2021-12-11 DIAGNOSIS — L719 Rosacea, unspecified: Secondary | ICD-10-CM | POA: Diagnosis not present

## 2021-12-13 DIAGNOSIS — E038 Other specified hypothyroidism: Secondary | ICD-10-CM | POA: Diagnosis not present

## 2021-12-13 DIAGNOSIS — Z79899 Other long term (current) drug therapy: Secondary | ICD-10-CM | POA: Diagnosis not present

## 2021-12-13 DIAGNOSIS — D518 Other vitamin B12 deficiency anemias: Secondary | ICD-10-CM | POA: Diagnosis not present

## 2021-12-13 DIAGNOSIS — E559 Vitamin D deficiency, unspecified: Secondary | ICD-10-CM | POA: Diagnosis not present

## 2021-12-13 DIAGNOSIS — E119 Type 2 diabetes mellitus without complications: Secondary | ICD-10-CM | POA: Diagnosis not present

## 2021-12-13 DIAGNOSIS — E7849 Other hyperlipidemia: Secondary | ICD-10-CM | POA: Diagnosis not present

## 2021-12-15 DIAGNOSIS — I5022 Chronic systolic (congestive) heart failure: Secondary | ICD-10-CM | POA: Diagnosis not present

## 2021-12-15 DIAGNOSIS — R21 Rash and other nonspecific skin eruption: Secondary | ICD-10-CM | POA: Diagnosis not present

## 2021-12-15 DIAGNOSIS — R918 Other nonspecific abnormal finding of lung field: Secondary | ICD-10-CM | POA: Diagnosis not present

## 2021-12-15 DIAGNOSIS — J849 Interstitial pulmonary disease, unspecified: Secondary | ICD-10-CM | POA: Diagnosis not present

## 2021-12-15 DIAGNOSIS — K219 Gastro-esophageal reflux disease without esophagitis: Secondary | ICD-10-CM | POA: Diagnosis not present

## 2021-12-15 DIAGNOSIS — Z66 Do not resuscitate: Secondary | ICD-10-CM | POA: Diagnosis not present

## 2021-12-15 DIAGNOSIS — J8410 Pulmonary fibrosis, unspecified: Secondary | ICD-10-CM | POA: Diagnosis not present

## 2021-12-15 DIAGNOSIS — I110 Hypertensive heart disease with heart failure: Secondary | ICD-10-CM | POA: Diagnosis not present

## 2021-12-15 DIAGNOSIS — Z87891 Personal history of nicotine dependence: Secondary | ICD-10-CM | POA: Diagnosis not present

## 2021-12-15 DIAGNOSIS — J329 Chronic sinusitis, unspecified: Secondary | ICD-10-CM | POA: Diagnosis not present

## 2021-12-15 DIAGNOSIS — R0602 Shortness of breath: Secondary | ICD-10-CM | POA: Diagnosis not present

## 2021-12-15 DIAGNOSIS — I2510 Atherosclerotic heart disease of native coronary artery without angina pectoris: Secondary | ICD-10-CM | POA: Diagnosis not present

## 2021-12-20 DIAGNOSIS — E559 Vitamin D deficiency, unspecified: Secondary | ICD-10-CM | POA: Diagnosis not present

## 2021-12-20 DIAGNOSIS — E119 Type 2 diabetes mellitus without complications: Secondary | ICD-10-CM | POA: Diagnosis not present

## 2021-12-20 DIAGNOSIS — D518 Other vitamin B12 deficiency anemias: Secondary | ICD-10-CM | POA: Diagnosis not present

## 2021-12-20 DIAGNOSIS — R2681 Unsteadiness on feet: Secondary | ICD-10-CM | POA: Diagnosis not present

## 2021-12-20 DIAGNOSIS — I509 Heart failure, unspecified: Secondary | ICD-10-CM | POA: Diagnosis not present

## 2021-12-20 DIAGNOSIS — J449 Chronic obstructive pulmonary disease, unspecified: Secondary | ICD-10-CM | POA: Diagnosis not present

## 2021-12-20 DIAGNOSIS — M6281 Muscle weakness (generalized): Secondary | ICD-10-CM | POA: Diagnosis not present

## 2021-12-20 DIAGNOSIS — J45909 Unspecified asthma, uncomplicated: Secondary | ICD-10-CM | POA: Diagnosis not present

## 2021-12-20 DIAGNOSIS — I2510 Atherosclerotic heart disease of native coronary artery without angina pectoris: Secondary | ICD-10-CM | POA: Diagnosis not present

## 2021-12-20 DIAGNOSIS — E785 Hyperlipidemia, unspecified: Secondary | ICD-10-CM | POA: Diagnosis not present

## 2021-12-20 DIAGNOSIS — E038 Other specified hypothyroidism: Secondary | ICD-10-CM | POA: Diagnosis not present

## 2021-12-20 DIAGNOSIS — M1990 Unspecified osteoarthritis, unspecified site: Secondary | ICD-10-CM | POA: Diagnosis not present

## 2021-12-20 DIAGNOSIS — I480 Paroxysmal atrial fibrillation: Secondary | ICD-10-CM | POA: Diagnosis not present

## 2021-12-20 DIAGNOSIS — N400 Enlarged prostate without lower urinary tract symptoms: Secondary | ICD-10-CM | POA: Diagnosis not present

## 2021-12-20 NOTE — Progress Notes (Unsigned)
Name: Mark [name]   MRN: [id number]    DOB: 01-02-1929   Date:12/21/2021       Progress Note  Subjective  Chief Complaint  Follow Up  HPI  PAF/CHF/pulmonary fibrosis/Atherosclerosis of Aorta: patients health has declined rapidly over the past few months. He had one fall at home, having to use a walker. SOB has increased, went to [facility] three times since January and admitted twice. He has pulmonary fibrosis, PAF,  CHF with EF 25-30 %, he has a history of CAD s/p PCI of mid LAD in 2005 and atherosclerosis of aorta. He is on statin therapy , digoxin since March, Eliquis and Bumex. 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   Paroxysmal atrial fibrillation ([place]) 11/14/2021   Tachycardia 09/29/2021   HFrEF (heart failure with reduced ejection fraction) ([place]) 12/02/2020   Cardiac syncope 09/22/2019   Fibrosis of lung ([place]) 09/29/2018   Senile purpura ([place]) 09/29/2018   Atherosclerosis of aorta ([place]) 09/29/2018   Hardening of the aorta (main artery of the heart) ([place]) 09/29/2018   Abnormal ECG 06/16/2018   Thoracic aortic aneurysm without rupture ([place]) 05/28/2018   CHF (congestive heart failure), NYHA class III, chronic, systolic (H[facility]) 11/65/7903   Chronic HFrEF (heart failure with reduced ejection fraction) ([place]) 05/28/2018   BPH with obstruction/lower urinary tract symptoms 02/26/2018   Ischemic cardiomyopathy 06/11/2017   Benign essential HTN 05/22/2017   Bilateral carotid artery stenosis 05/22/2017   Neuropathy 01/28/2017   Right leg paresthesias 11/26/2016   Hypertension 08/27/2016   GERD (gastroesophageal reflux disease) 08/27/2016   Hyperglycemia 05/10/2016   History of nonmelanoma skin cancer 10/10/2015   Pain in right shoulder 09/13/2015   Osteoarthritis 07/06/2015   Hyperlipidemia 03/29/2015   Family history of  type 2 diabetes mellitus in father 03/29/2015   DD (diverticular disease) 02/03/2015   Diverticulosis of intestine without perforation or abscess without bleeding 02/03/2015   DDD (degenerative disc disease), lumbar 03/03/2014   Intervertebral disc disorder with radiculopathy of lumbar region 03/03/2014   Lumbar radiculitis 03/03/2014   Lumbar stenosis with neurogenic claudication 03/03/2014   Arteriosclerosis of coronary artery 04/18/2012   S/P coronary artery stent placement 04/18/2012   Status post percutaneous transluminal coronary angioplasty 04/18/2012    Past Surgical History:  Procedure Laterality Date   CATARACT EXTRACTION W/PHACO Right 11/24/2019   Procedure: CATARACT EXTRACTION PHACO AND INTRAOCULAR LENS PLACEMENT (IOC) RIGHT MALYUGIN 8.59 00:50.8;  Surgeon: Birder Robson, MD;  Location: [place];  Service: Ophthalmology;  Laterality: Right;  COVID ( + ) 09-01-19   CATARACT EXTRACTION W/PHACO Left 12/15/2019   Procedure: CATARACT EXTRACTION PHACO AND INTRAOCULAR LENS PLACEMENT (IOC) LEFT 9.90  01:03.7;  Surgeon: Birder Robson, MD;  Location: [place];  Service: Ophthalmology;  Laterality: Left;   CORONARY ANGIOPLASTY WITH STENT PLACEMENT  2005   [facility]    Family History  Problem Relation Age of Onset   Hypertension Mother    Heart disease Mother    Heart disease Father    Hypertension Father     Social History   Tobacco Use   Smoking status: Former    Packs/day: 0.25    Years: 15.00    Pack years: 3.75    Types: Cigarettes    Quit date: 05/16/1963    Years since quitting: 58.6   Smokeless tobacco:  Never   Tobacco comments:    quit over 50 years ago- 08/08/2021  Substance Use Topics   Alcohol use: No     Current Outpatient Medications:    acetaminophen (TYLENOL) 500 MG tablet, Take 500 mg by mouth every 6 (six) hours as needed., Disp: , Rfl:    atorvastatin (LIPITOR) 10 MG tablet, Take 1 tablet (10 mg total) by mouth daily., Disp: 90  tablet, Rfl: 1   bumetanide (BUMEX) 2 MG tablet, Take 1 tablet by mouth in the morning and at bedtime., Disp: , Rfl:    digoxin (LANOXIN) 0.125 MG tablet, Take 125 mcg by mouth daily., Disp: , Rfl:    ELIQUIS 5 MG TABS tablet, Take 5 mg by mouth 2 (two) times daily., Disp: , Rfl:    FARXIGA 10 MG TABS tablet, Take 10 mg by mouth daily., Disp: , Rfl:    fluorouracil (EFUDEX) 5 % cream, Apply topically 2 (two) times daily. To scalp x 1 week - start Dec 20, 2021, Disp: 15 g, Rfl: 0   furosemide (LASIX) 40 MG tablet, Take 40 mg by mouth 2 (two) times daily., Disp: , Rfl:    hydrocortisone cream 1 %, Apply topically., Disp: , Rfl:    hydrOXYzine (ATARAX) 25 MG tablet, Take 25 mg by mouth 3 (three) times daily as needed., Disp: , Rfl:    ipratropium-albuterol (DUONEB) 0.5-2.5 (3) MG/3ML SOLN, Take 3 mLs by nebulization 3 (three) times daily as needed (sob cough wheeze - use in place of albuterol nebs)., Disp: 30 mL, Rfl: 0   levalbuterol (XOPENEX) 1.25 MG/3ML nebulizer solution, Take 1.25 mg by nebulization 3 (three) times daily as needed for wheezing., Disp: 90 mL, Rfl: 3   loratadine (CLARITIN) 10 MG tablet, Take 1 tablet by mouth daily., Disp: , Rfl:    magnesium oxide (MAG-OX) 400 MG tablet, Take 1 tablet by mouth daily., Disp: , Rfl:    melatonin 3 MG TABS tablet, Take 2 tablets by mouth every evening., Disp: , Rfl:    mometasone (ELOCON) 0.1 % cream, Apply 1 application. topically daily. 5 times per week to rash and itch of neck, Disp: 45 g, Rfl: 0   montelukast (SINGULAIR) 10 MG tablet, Take 1 tablet (10 mg total) by mouth at bedtime., Disp: 90 tablet, Rfl: 1   pantoprazole (PROTONIX) 40 MG tablet, Take 1 tablet by mouth daily., Disp: , Rfl:    Potassium Chloride ER 20 MEQ TBCR, Take 1 tablet by mouth 2 (two) times daily., Disp: , Rfl:    Probiotic Product (PROBIOTIC PO), Take by mouth daily., Disp: , Rfl:    tamsulosin (FLOMAX) 0.4 MG CAPS capsule, TAKE 1 CAPSULE BY MOUTH  DAILY, Disp: 90  capsule, Rfl: 3   traZODone (DESYREL) 150 MG tablet, Take 1 tablet by mouth at bedtime., Disp: , Rfl:   Allergies  Allergen Reactions   Gramineae Pollens Itching and Shortness Of Breath    Sneezing   Propranolol Other (See Comments)    syncope   Cat Hair Extract Itching    Watery eyes and sneezing   Breo Ellipta [Fluticasone Furoate-Vilanterol] Rash    Patient stated that it made his mouth break out   Rosuvastatin Rash and Other (See Comments)    No reaction per pt, He saw some information regarding Crestor and adverse effects     I personally reviewed active problem list, medication list, allergies, family history, social history, health maintenance with the patient/caregiver today.   ROS  ***  Objective  Vitals:   12/21/21 1027  BP: 112/66  Pulse: 93  Resp: 16  SpO2: 96%  Weight: 169 lb (76.7 kg)  Height: '5\' 8"'  (1.727 m)    Body mass index is 25.7 kg/m.  Physical Exam ***  Recent Results (from the past 2160 hour(s))  Lipid panel     Status: None   Collection Time: 11/23/21 11:55 AM  Result Value Ref Range   Cholesterol 159 <200 mg/dL   HDL 48 > OR = 40 mg/dL   Triglycerides 101 <150 mg/dL   LDL Cholesterol (Calc) 92 mg/dL (calc)    Comment: Reference range: <100 . Desirable range <100 mg/dL for primary prevention;   <70 mg/dL for patients with CHD or diabetic patients  with > or = 2 CHD risk factors. Marland Kitchen LDL-C is now calculated using the Martin-Hopkins  calculation, which is a validated novel method providing  better accuracy than the Friedewald equation in the  estimation of LDL-C.  Cresenciano Genre et al. Annamaria Helling. 1157;262(03): 2061-2068  (http://education.[facility].com/faq/FAQ164)    Total CHOL/HDL Ratio 3.3 <5.0 (calc)   Non-HDL Cholesterol (Calc) 111 <130 mg/dL (calc)    Comment: For patients with diabetes plus 1 major ASCVD risk  factor, treating to a non-HDL-C goal of <100 mg/dL  (LDL-C of <70 mg/dL) is considered a therapeutic  option.   CBC  with Differential/Platelet     Status: Abnormal   Collection Time: 11/23/21 11:55 AM  Result Value Ref Range   WBC 11.1 (H) 3.8 - 10.8 Thousand/uL   RBC 4.75 4.20 - 5.80 Million/uL   Hemoglobin 15.0 13.2 - 17.1 g/dL   HCT 45.2 38.5 - 50.0 %   MCV 95.2 80.0 - 100.0 fL   MCH 31.6 27.0 - 33.0 pg   MCHC 33.2 32.0 - 36.0 g/dL   RDW 11.6 11.0 - 15.0 %   Platelets 314 140 - 400 Thousand/uL   MPV 9.8 7.5 - 12.5 fL   Neutro Abs 7,393 1,500 - 7,800 cells/uL   Lymphs Abs 2,453 850 - 3,900 cells/uL   Absolute Monocytes 1,154 (H) 200 - 950 cells/uL   Eosinophils Absolute 78 15 - 500 cells/uL   Basophils Absolute 22 0 - 200 cells/uL   Neutrophils Relative % 66.6 %   Total Lymphocyte 22.1 %   Monocytes Relative 10.4 %   Eosinophils Relative 0.7 %   Basophils Relative 0.2 %  COMPLETE METABOLIC PANEL WITH GFR     Status: Abnormal   Collection Time: 11/23/21 11:55 AM  Result Value Ref Range   Glucose, Bld 109 (H) 65 - 99 mg/dL    Comment: .            Fasting reference interval . For someone without known diabetes, a glucose value between 100 and 125 mg/dL is consistent with prediabetes and should be confirmed with a follow-up test. .    BUN 22 7 - 25 mg/dL   Creat 1.20 0.70 - 1.22 mg/dL   eGFR 57 (L) > OR = 60 mL/min/1.60m    Comment: The eGFR is based on the CKD-EPI 2021 equation. To calculate  the new eGFR from a previous Creatinine or Cystatin C result, go to https://www.kidney.org/professionals/ kdoqi/gfr%5Fcalculator    BUN/Creatinine Ratio NOT APPLICABLE 6 - 22 (calc)   Sodium 137 135 - 146 mmol/L   Potassium 5.3 3.5 - 5.3 mmol/L   Chloride 98 98 - 110 mmol/L   CO2 26 20 - 32 mmol/L   Calcium 9.6 8.6 - 10.3 mg/dL  Total Protein 7.0 6.1 - 8.1 g/dL   Albumin 4.0 3.6 - 5.1 g/dL   Globulin 3.0 1.9 - 3.7 g/dL (calc)   AG Ratio 1.3 1.0 - 2.5 (calc)   Total Bilirubin 1.0 0.2 - 1.2 mg/dL   Alkaline phosphatase (APISO) 79 35 - 144 U/L   AST 19 10 - 35 U/L   ALT 15 9 - 46 U/L   QuantiFERON-TB Gold Plus     Status: None   Collection Time: 11/23/21 11:55 AM  Result Value Ref Range   QuantiFERON-TB Gold Plus NEGATIVE NEGATIVE    Comment: Negative test result. M. tuberculosis complex  infection unlikely.    NIL 0.06 IU/mL   Mitogen-NIL >10.00 IU/mL   TB1-NIL 0.01 IU/mL   TB2-NIL 0.01 IU/mL    Comment: . The Nil tube value reflects the background interferon gamma immune response of the patient's blood sample. This value has been subtracted from the patient's displayed TB and Mitogen results. . Lower than expected results with the Mitogen tube prevent false-negative Quantiferon readings by detecting a patient with a potential immune suppressive condition and/or suboptimal pre-analytical specimen handling. . The TB1 Antigen tube is coated with the M. tuberculosis-specific antigens designed to elicit responses from TB antigen primed CD4+ helper T-lymphocytes. . The TB2 Antigen tube is coated with the M. tuberculosis-specific antigens designed to elicit responses from TB antigen primed CD4+ helper and CD8+ cytotoxic T-lymphocytes. . For additional information, please refer to https://education.[facility].com/faq/FAQ204 (This link is being provided for informational/ educational purposes only.) .   POCT Urinalysis Dipstick     Status: None   Collection Time: 11/23/21  3:00 PM  Result Value Ref Range   Color, UA Yellow    Clarity, UA Clear    Glucose, UA Negative Negative   Bilirubin, UA Negative    Ketones, UA Negative    Spec Grav, UA 1.010 1.010 - 1.025   Blood, UA Negative    pH, UA 7.0 5.0 - 8.0   Protein, UA Negative Negative   Urobilinogen, UA 0.2 0.2 or 1.0 E.U./dL   Nitrite, UA Negative    Leukocytes, UA Negative Negative   Appearance     Odor    Urine Culture     Status: None   Collection Time: 11/23/21  3:14 PM   Specimen: Urine  Result Value Ref Range   MICRO NUMBER: [id number]    SPECIMEN QUALITY: Adequate    Sample  Source URINE    STATUS: FINAL    ISOLATE 1:      Mixed genital flora isolated. These superficial bacteria are not indicative of a urinary tract infection. No further organism identification is warranted on this specimen. If clinically indicated, recollect clean-catch, mid-stream urine and transfer  immediately to Urine Culture Transport Tube.     PHQ2/9:    12/21/2021   10:21 AM 11/23/2021   11:21 AM 11/13/2021    9:52 AM 08/23/2021    1:49 PM 07/18/2021    8:46 AM  Depression screen PHQ 2/9  Decreased Interest 0 0 0 0 0  Down, Depressed, Hopeless 0 0 0 0 0  PHQ - 2 Score 0 0 0 0 0  Altered sleeping 0 0 0 0 0  Tired, decreased energy 0 0 0 0 0  Change in appetite 0 0 0 0 0  Feeling bad or failure about yourself  0 0 0 0 0  Trouble concentrating 0 0 0 0 0  Moving slowly or fidgety/restless 0 0 0 0 0  Suicidal thoughts 0 0 0 0 0  PHQ-9 Score 0 0 0 0 0  Difficult doing work/chores   Not difficult at all      phq 9 is {gen pos [id number]}   Fall Risk:    12/21/2021   10:21 AM 11/23/2021   11:23 AM 11/13/2021    9:51 AM 08/23/2021    1:49 PM 07/18/2021    8:46 AM  Fall Risk   Falls in the past year? 0 1 0 0 0  Number falls in past yr: 0 0 0 0 0  Injury with Fall? 0 0 0 0 0  Risk for fall due to : Impaired balance/gait Impaired balance/gait No Fall Risks No Fall Risks No Fall Risks  Follow up Falls prevention discussed Falls prevention discussed;Falls evaluation completed;Education provided Falls prevention discussed;Education provided Falls prevention discussed Falls prevention discussed      Functional Status Survey: Is the patient deaf or have difficulty hearing?: No Does the patient have difficulty seeing, even when wearing glasses/contacts?: No Does the patient have difficulty concentrating, remembering, or making decisions?: Yes Does the patient have difficulty walking or climbing stairs?: Yes Does the patient have difficulty dressing or bathing?: Yes Does the patient  have difficulty doing errands alone such as visiting a doctor's office or shopping?: No    Assessment & Plan  *** There are no diagnoses linked to this encounter.

## 2021-12-21 ENCOUNTER — Encounter: Payer: Self-pay | Admitting: Family Medicine

## 2021-12-21 ENCOUNTER — Ambulatory Visit (INDEPENDENT_AMBULATORY_CARE_PROVIDER_SITE_OTHER): Payer: Medicare HMO | Admitting: Family Medicine

## 2021-12-21 ENCOUNTER — Ambulatory Visit (INDEPENDENT_AMBULATORY_CARE_PROVIDER_SITE_OTHER): Payer: Medicare HMO

## 2021-12-21 VITALS — BP 112/66 | HR 93 | Resp 16 | Ht 68.0 in | Wt 169.0 lb

## 2021-12-21 DIAGNOSIS — I5020 Unspecified systolic (congestive) heart failure: Secondary | ICD-10-CM | POA: Diagnosis not present

## 2021-12-21 DIAGNOSIS — I480 Paroxysmal atrial fibrillation: Secondary | ICD-10-CM | POA: Diagnosis not present

## 2021-12-21 DIAGNOSIS — Z0000 Encounter for general adult medical examination without abnormal findings: Secondary | ICD-10-CM

## 2021-12-21 DIAGNOSIS — E441 Mild protein-calorie malnutrition: Secondary | ICD-10-CM

## 2021-12-21 DIAGNOSIS — J849 Interstitial pulmonary disease, unspecified: Secondary | ICD-10-CM

## 2021-12-21 DIAGNOSIS — E039 Hypothyroidism, unspecified: Secondary | ICD-10-CM | POA: Insufficient documentation

## 2021-12-21 DIAGNOSIS — I10 Essential (primary) hypertension: Secondary | ICD-10-CM | POA: Diagnosis not present

## 2021-12-21 DIAGNOSIS — I700 Atherosclerosis of aorta: Secondary | ICD-10-CM

## 2021-12-21 DIAGNOSIS — E559 Vitamin D deficiency, unspecified: Secondary | ICD-10-CM | POA: Insufficient documentation

## 2021-12-21 DIAGNOSIS — D692 Other nonthrombocytopenic purpura: Secondary | ICD-10-CM

## 2021-12-21 DIAGNOSIS — D518 Other vitamin B12 deficiency anemias: Secondary | ICD-10-CM | POA: Insufficient documentation

## 2021-12-21 DIAGNOSIS — J8410 Pulmonary fibrosis, unspecified: Secondary | ICD-10-CM

## 2021-12-21 DIAGNOSIS — J302 Other seasonal allergic rhinitis: Secondary | ICD-10-CM | POA: Insufficient documentation

## 2021-12-21 NOTE — Assessment & Plan Note (Signed)
Arms, reassurance given

## 2021-12-21 NOTE — Assessment & Plan Note (Signed)
Gaining weight since he moved in to [facility] 10/2021

## 2021-12-21 NOTE — Progress Notes (Deleted)
Name: Mark [name]   MRN: [id number]    DOB: 07/03/1929   Date:12/21/2021       Progress Note  Subjective  Chief Complaint  Follow Up  HPI  PAF/CHF/pulmonary fibrosis/Atherosclerosis of Aorta: patients health has declined rapidly in 2022 and worse in early 2023  with increase in  SOB followed by multiple EC visits and hospital stay. He is now living and [facility] and is gaining weight, still using a walker but feeling better, taking Demadex prn to control CHF  CHF: he is under the care of Dr. Nehemiah Massed. He had syncope earlier in 2021 seen by Dr. Scarlette Ar.  He has SOB with minor activity now . He status post STEMI and PCI with stent to mid LAD in 2005 . He had repeat Echo and Stress Myoview, previous Echo was 40 % and is down to 20-30 %. He is compliant with medications, he states he knows when to take extra dose of Demadex to control symptoms    Last Echo was done 10/13/2019    INTERPRETATION SEVERE LV SYSTOLIC DYSFUNCTION (See above)   WITH MILD LVH NORMAL RIGHT VENTRICULAR SYSTOLIC FUNCTION MILD VALVULAR REGURGITATION (See above) NO VALVULAR STENOSIS MILD AR, MR, TR EF 25-30%    BPH: doing well on Flomax, stable    Lung nodules/pulmonary fibrosis/COPD /Asthma: he is was seeing Dr. Mortimer Fries, but was seen recently at [facility] for pulmonary firbrosis. Notes states his symptoms likely from CHF, he continues to have SOB with activity, a cough that is bothersome , he used to smoke but quit over 50 years ago. No wheezing     Atherosclerosis aorta/Aneurysm of thoracic aorta: discussed results with patient, he is not interested in changing management , also discussed aneurysm of aorta, guidelines recommends repeat US in 2024 . He is still taking Atorvastatin , he is under the care of Dr. Erven Colla, he sees vascular surgeon once a year Unchanged   OA: both knees, left shoulder and hands, stable, using walker but mostly due to SOB   Malnutrition: he had lost 15 lbs but has gained 6 lbs since moved in  to [facility] about one month ago   Senile purpura:both arms  Patient Active Problem List   Diagnosis Date Noted   Vitamin D deficiency 12/21/2021   Other vitamin B12 deficiency anemias 12/21/2021   Seasonal allergic rhinitis 12/21/2021   Hypothyroidism 12/21/2021   ILD (interstitial lung disease) ([place]) 12/15/2021   Mild protein-calorie malnutrition ([place]) 11/23/2021   Paroxysmal atrial fibrillation ([place]) 11/14/2021   Tachycardia 09/29/2021   HFrEF (heart failure with reduced ejection fraction) ([place]) 12/02/2020   Cardiac syncope 09/22/2019   Fibrosis of lung ([place]) 09/29/2018   Senile purpura ([place]) 09/29/2018   Atherosclerosis of aorta ([place]) 09/29/2018   Abnormal ECG 06/16/2018   Thoracic aortic aneurysm without rupture ([place]) 05/28/2018   CHF (congestive heart failure), NYHA class III, chronic, systolic ([place]) 00/76/2263   Chronic HFrEF (heart failure with reduced ejection fraction) ([place]) 05/28/2018   BPH with obstruction/lower urinary tract symptoms 02/26/2018   Ischemic cardiomyopathy 06/11/2017   Benign essential HTN 05/22/2017   Bilateral carotid artery stenosis 05/22/2017   Neuropathy 01/28/2017   Right leg paresthesias 11/26/2016   Hypertension 08/27/2016   GERD (gastroesophageal reflux disease) 08/27/2016   Hyperglycemia 05/10/2016   History of nonmelanoma skin cancer 10/10/2015   Pain in right shoulder 09/13/2015   Osteoarthritis 07/06/2015   Hyperlipidemia 03/29/2015   Family history of type 2 diabetes mellitus in father 03/29/2015   Diverticulosis  of intestine without perforation or abscess without bleeding 02/03/2015   DDD (degenerative disc disease), lumbar 03/03/2014   Intervertebral disc disorder with radiculopathy of lumbar region 03/03/2014   Lumbar radiculitis 03/03/2014   Lumbar stenosis with neurogenic claudication 03/03/2014   Arteriosclerosis of coronary artery 04/18/2012   S/P coronary artery stent placement 04/18/2012   Status post percutaneous  transluminal coronary angioplasty 04/18/2012    Past Surgical History:  Procedure Laterality Date   CATARACT EXTRACTION W/PHACO Right 11/24/2019   Procedure: CATARACT EXTRACTION PHACO AND INTRAOCULAR LENS PLACEMENT (IOC) RIGHT MALYUGIN 8.59 00:50.8;  Surgeon: Birder Robson, MD;  Location: [facility];  Service: Ophthalmology;  Laterality: Right;  COVID ( + ) 09-01-19   CATARACT EXTRACTION W/PHACO Left 12/15/2019   Procedure: CATARACT EXTRACTION PHACO AND INTRAOCULAR LENS PLACEMENT (IOC) LEFT 9.90  01:03.7;  Surgeon: Birder Robson, MD;  Location: [facility];  Service: Ophthalmology;  Laterality: Left;   CORONARY ANGIOPLASTY WITH STENT PLACEMENT  2005   [facility]    Family History  Problem Relation Age of Onset   Hypertension Mother    Heart disease Mother    Heart disease Father    Hypertension Father     Social History   Tobacco Use   Smoking status: Former    Packs/day: 0.25    Years: 15.00    Pack years: 3.75    Types: Cigarettes    Quit date: 05/16/1963    Years since quitting: 58.6   Smokeless tobacco: Never   Tobacco comments:    quit over 50 years ago- 08/08/2021  Substance Use Topics   Alcohol use: No     Current Outpatient Medications:    acetaminophen (TYLENOL) 500 MG tablet, Take 500 mg by mouth every 6 (six) hours as needed., Disp: , Rfl:    atorvastatin (LIPITOR) 10 MG tablet, Take 1 tablet (10 mg total) by mouth daily., Disp: 90 tablet, Rfl: 1   bumetanide (BUMEX) 2 MG tablet, Take 1 tablet by mouth in the morning and at bedtime., Disp: , Rfl:    digoxin (LANOXIN) 0.125 MG tablet, Take 125 mcg by mouth daily., Disp: , Rfl:    ELIQUIS 5 MG TABS tablet, Take 5 mg by mouth 2 (two) times daily., Disp: , Rfl:    FARXIGA 10 MG TABS tablet, Take 10 mg by mouth daily., Disp: , Rfl:    fluorouracil (EFUDEX) 5 % cream, Apply topically 2 (two) times daily. To scalp x 1 week - start Dec 20, 2021, Disp: 15 g, Rfl: 0   furosemide (LASIX) 40 MG tablet, Take  40 mg by mouth 2 (two) times daily., Disp: , Rfl:    hydrocortisone cream 1 %, Apply topically., Disp: , Rfl:    hydrOXYzine (ATARAX) 25 MG tablet, Take 25 mg by mouth 3 (three) times daily as needed., Disp: , Rfl:    ipratropium-albuterol (DUONEB) 0.5-2.5 (3) MG/3ML SOLN, Take 3 mLs by nebulization 3 (three) times daily as needed (sob cough wheeze - use in place of albuterol nebs)., Disp: 30 mL, Rfl: 0   levalbuterol (XOPENEX) 1.25 MG/3ML nebulizer solution, Take 1.25 mg by nebulization 3 (three) times daily as needed for wheezing., Disp: 90 mL, Rfl: 3   loratadine (CLARITIN) 10 MG tablet, Take 1 tablet by mouth daily., Disp: , Rfl:    magnesium oxide (MAG-OX) 400 MG tablet, Take 1 tablet by mouth daily., Disp: , Rfl:    melatonin 3 MG TABS tablet, Take 2 tablets by mouth every evening., Disp: , Rfl:  mometasone (ELOCON) 0.1 % cream, Apply 1 application. topically daily. 5 times per week to rash and itch of neck, Disp: 45 g, Rfl: 0   montelukast (SINGULAIR) 10 MG tablet, Take 1 tablet (10 mg total) by mouth at bedtime., Disp: 90 tablet, Rfl: 1   pantoprazole (PROTONIX) 40 MG tablet, Take 1 tablet by mouth daily., Disp: , Rfl:    Potassium Chloride ER 20 MEQ TBCR, Take 1 tablet by mouth 2 (two) times daily., Disp: , Rfl:    Probiotic Product (PROBIOTIC PO), Take by mouth daily., Disp: , Rfl:    tamsulosin (FLOMAX) 0.4 MG CAPS capsule, TAKE 1 CAPSULE BY MOUTH  DAILY, Disp: 90 capsule, Rfl: 3   traZODone (DESYREL) 150 MG tablet, Take 1 tablet by mouth at bedtime., Disp: , Rfl:    diclofenac Sodium (VOLTAREN) 1 % GEL, Apply topically., Disp: , Rfl:    fluocinolone (SYNALAR) 0.01 % external solution, Apply topically., Disp: , Rfl:   Allergies  Allergen Reactions   Gramineae Pollens Itching and Shortness Of Breath    Sneezing   Propranolol Other (See Comments)    syncope   Cat Hair Extract Itching    Watery eyes and sneezing   Breo Ellipta [Fluticasone Furoate-Vilanterol] Rash    Patient  stated that it made his mouth break out   Rosuvastatin Rash and Other (See Comments)    No reaction per pt, He saw some information regarding Crestor and adverse effects     I personally reviewed active problem list, medication list, allergies, family history, social history, health maintenance with the patient/caregiver today.   ROS  Constitutional: Negative for fever, positive for  weight change.  Respiratory: positive for cough and shortness of breath.   Cardiovascular: Negative for chest pain or palpitations.  Gastrointestinal: Negative for abdominal pain, no bowel changes.  Musculoskeletal: positive for gait problem but no  joint swelling.  Skin: Negative for rash.  Neurological: Negative for dizziness or headache.  No other specific complaints in a complete review of systems (except as listed in HPI above).   Objective  Vitals:   12/21/21 1027  BP: 112/66  Pulse: 93  Resp: 16  SpO2: 96%  Weight: 169 lb (76.7 kg)  Height: '5\' 8"'  (1.727 m)    Body mass index is 25.7 kg/m.  Physical Exam  Constitutional: Patient appears well-developed and well-nourished.  No distress.  HEENT: head atraumatic, normocephalic, pupils equal and reactive to light, neck supple Cardiovascular: Normal rate, regular rhythm and normal heart sounds.  No murmur heard. Trace  BLE edema. Pulmonary/Chest: Effort normal and breath sounds normal. No respiratory distress. Abdominal: Soft.  There is no tenderness. Psychiatric: Patient has a normal mood and affect. behavior is normal. Judgment and thought content normal.   Recent Results (from the past 2160 hour(s))  Lipid panel     Status: None   Collection Time: 11/23/21 11:55 AM  Result Value Ref Range   Cholesterol 159 <200 mg/dL   HDL 48 > OR = 40 mg/dL   Triglycerides 101 <150 mg/dL   LDL Cholesterol (Calc) 92 mg/dL (calc)    Comment: Reference range: <100 . Desirable range <100 mg/dL for primary prevention;   <70 mg/dL for patients with  CHD or diabetic patients  with > or = 2 CHD risk factors. Marland Kitchen LDL-C is now calculated using the Martin-Hopkins  calculation, which is a validated novel method providing  better accuracy than the Friedewald equation in the  estimation of LDL-C.  Cresenciano Genre et  al. JAMA. 5916;384(66): 2061-2068  (http://education.[facility].com/faq/FAQ164)    Total CHOL/HDL Ratio 3.3 <5.0 (calc)   Non-HDL Cholesterol (Calc) 111 <130 mg/dL (calc)    Comment: For patients with diabetes plus 1 major ASCVD risk  factor, treating to a non-HDL-C goal of <100 mg/dL  (LDL-C of <70 mg/dL) is considered a therapeutic  option.   CBC with Differential/Platelet     Status: Abnormal   Collection Time: 11/23/21 11:55 AM  Result Value Ref Range   WBC 11.1 (H) 3.8 - 10.8 Thousand/uL   RBC 4.75 4.20 - 5.80 Million/uL   Hemoglobin 15.0 13.2 - 17.1 g/dL   HCT 45.2 38.5 - 50.0 %   MCV 95.2 80.0 - 100.0 fL   MCH 31.6 27.0 - 33.0 pg   MCHC 33.2 32.0 - 36.0 g/dL   RDW 11.6 11.0 - 15.0 %   Platelets 314 140 - 400 Thousand/uL   MPV 9.8 7.5 - 12.5 fL   Neutro Abs 7,393 1,500 - 7,800 cells/uL   Lymphs Abs 2,453 850 - 3,900 cells/uL   Absolute Monocytes 1,154 (H) 200 - 950 cells/uL   Eosinophils Absolute 78 15 - 500 cells/uL   Basophils Absolute 22 0 - 200 cells/uL   Neutrophils Relative % 66.6 %   Total Lymphocyte 22.1 %   Monocytes Relative 10.4 %   Eosinophils Relative 0.7 %   Basophils Relative 0.2 %  COMPLETE METABOLIC PANEL WITH GFR     Status: Abnormal   Collection Time: 11/23/21 11:55 AM  Result Value Ref Range   Glucose, Bld 109 (H) 65 - 99 mg/dL    Comment: .            Fasting reference interval . For someone without known diabetes, a glucose value between 100 and 125 mg/dL is consistent with prediabetes and should be confirmed with a follow-up test. .    BUN 22 7 - 25 mg/dL   Creat 1.20 0.70 - 1.22 mg/dL   eGFR 57 (L) > OR = 60 mL/min/1.4m    Comment: The eGFR is based on the CKD-EPI 2021  equation. To calculate  the new eGFR from a previous Creatinine or Cystatin C result, go to https://www.kidney.org/professionals/ kdoqi/gfr%5Fcalculator    BUN/Creatinine Ratio NOT APPLICABLE 6 - 22 (calc)   Sodium 137 135 - 146 mmol/L   Potassium 5.3 3.5 - 5.3 mmol/L   Chloride 98 98 - 110 mmol/L   CO2 26 20 - 32 mmol/L   Calcium 9.6 8.6 - 10.3 mg/dL   Total Protein 7.0 6.1 - 8.1 g/dL   Albumin 4.0 3.6 - 5.1 g/dL   Globulin 3.0 1.9 - 3.7 g/dL (calc)   AG Ratio 1.3 1.0 - 2.5 (calc)   Total Bilirubin 1.0 0.2 - 1.2 mg/dL   Alkaline phosphatase (APISO) 79 35 - 144 U/L   AST 19 10 - 35 U/L   ALT 15 9 - 46 U/L  QuantiFERON-TB Gold Plus     Status: None   Collection Time: 11/23/21 11:55 AM  Result Value Ref Range   QuantiFERON-TB Gold Plus NEGATIVE NEGATIVE    Comment: Negative test result. M. tuberculosis complex  infection unlikely.    NIL 0.06 IU/mL   Mitogen-NIL >10.00 IU/mL   TB1-NIL 0.01 IU/mL   TB2-NIL 0.01 IU/mL    Comment: . The Nil tube value reflects the background interferon gamma immune response of the patient's blood sample. This value has been subtracted from the patient's displayed TB and Mitogen results. . Lower than expected  results with the Mitogen tube prevent false-negative Quantiferon readings by detecting a patient with a potential immune suppressive condition and/or suboptimal pre-analytical specimen handling. . The TB1 Antigen tube is coated with the M. tuberculosis-specific antigens designed to elicit responses from TB antigen primed CD4+ helper T-lymphocytes. . The TB2 Antigen tube is coated with the M. tuberculosis-specific antigens designed to elicit responses from TB antigen primed CD4+ helper and CD8+ cytotoxic T-lymphocytes. . For additional information, please refer to https://education.[facility].com/faq/FAQ204 (This link is being provided for informational/ educational purposes only.) .   POCT Urinalysis Dipstick     Status:  None   Collection Time: 11/23/21  3:00 PM  Result Value Ref Range   Color, UA Yellow    Clarity, UA Clear    Glucose, UA Negative Negative   Bilirubin, UA Negative    Ketones, UA Negative    Spec Grav, UA 1.010 1.010 - 1.025   Blood, UA Negative    pH, UA 7.0 5.0 - 8.0   Protein, UA Negative Negative   Urobilinogen, UA 0.2 0.2 or 1.0 E.U./dL   Nitrite, UA Negative    Leukocytes, UA Negative Negative   Appearance     Odor    Urine Culture     Status: None   Collection Time: 11/23/21  3:14 PM   Specimen: Urine  Result Value Ref Range   MICRO NUMBER: [id number]    SPECIMEN QUALITY: Adequate    Sample Source URINE    STATUS: FINAL    ISOLATE 1:      Mixed genital flora isolated. These superficial bacteria are not indicative of a urinary tract infection. No further organism identification is warranted on this specimen. If clinically indicated, recollect clean-catch, mid-stream urine and transfer  immediately to Urine Culture Transport Tube.     PHQ2/9:    12/21/2021   11:14 AM 12/21/2021   10:21 AM 11/23/2021   11:21 AM 11/13/2021    9:52 AM 08/23/2021    1:49 PM  Depression screen PHQ 2/9  Decreased Interest 0 0 0 0 0  Down, Depressed, Hopeless 0 0 0 0 0  PHQ - 2 Score 0 0 0 0 0  Altered sleeping 0 0 0 0 0  Tired, decreased energy 0 0 0 0 0  Change in appetite 0 0 0 0 0  Feeling bad or failure about yourself  0 0 0 0 0  Trouble concentrating 0 0 0 0 0  Moving slowly or fidgety/restless 0 0 0 0 0  Suicidal thoughts 0 0 0 0 0  PHQ-9 Score 0 0 0 0 0  Difficult doing work/chores Not difficult at all   Not difficult at all     phq 9 is negative   Fall Risk:    12/21/2021   11:15 AM 12/21/2021   10:21 AM 11/23/2021   11:23 AM 11/13/2021    9:51 AM 08/23/2021    1:49 PM  Fall Risk   Falls in the past year? 0 0 1 0 0  Number falls in past yr: 0 0 0 0 0  Injury with Fall? 0 0 0 0 0  Risk for fall due to : No Fall Risks Impaired balance/gait Impaired balance/gait No Fall  Risks No Fall Risks  Follow up Falls prevention discussed Falls prevention discussed Falls prevention discussed;Falls evaluation completed;Education provided Falls prevention discussed;Education provided Falls prevention discussed     Functional Status Survey: Is the patient deaf or have difficulty hearing?: No Does the patient have difficulty  seeing, even when wearing glasses/contacts?: No Does the patient have difficulty concentrating, remembering, or making decisions?: Yes Does the patient have difficulty walking or climbing stairs?: Yes Does the patient have difficulty dressing or bathing?: Yes Does the patient have difficulty doing errands alone such as visiting a doctor's office or shopping?: No    Assessment & Plan  Problem List Items Addressed This Visit     Senile purpura ([place])    Arms, reassurance given        Relevant Medications   furosemide (LASIX) 40 MG tablet   Atherosclerosis of aorta (HCC)    On statin therapy        Relevant Medications   furosemide (LASIX) 40 MG tablet   HFrEF (heart failure with reduced ejection fraction) ([place])    Under the care of Dr. Rockey Situ, taking medications as prescribed       Relevant Medications   furosemide (LASIX) 40 MG tablet   Mild protein-calorie malnutrition ([place])    Gaining weight since he moved in to [facility] 10/2021       ILD (interstitial lung disease) ([place])    Went to [facility], not currently on medication for ILD       Paroxysmal atrial fibrillation (HCC) - Primary   Relevant Medications   furosemide (LASIX) 40 MG tablet   Fibrosis of lung (HCC)   Benign essential HTN   Relevant Medications   furosemide (LASIX) 40 MG tablet

## 2021-12-21 NOTE — Progress Notes (Signed)
Subjective:   Mark [name] is a 88 y.o. male who presents for Medicare Annual/Subsequent preventive examination.  Review of Systems     Cardiac Risk Factors include: advanced age (>47mn, >>76women);dyslipidemia;male gender;hypertension     Objective:    Today's Vitals   12/21/21 1121  BP: 112/66  Pulse: 93  Resp: 16  SpO2: 96%  Weight: 169 lb (76.7 kg)  Height: '5\' 8"'$  (1.727 m)   Body mass index is 25.7 kg/m.     12/21/2021   11:15 AM 12/01/2020    9:29 AM 11/17/2020    8:15 AM 12/15/2019   10:02 AM 11/24/2019    7:35 AM 11/17/2019    8:27 AM 09/14/2019    3:41 PM  Advanced Directives  Does Patient Have a Medical Advance Directive? Yes Yes Yes Yes Yes Yes Yes  Type of [place]of [place] will Living will;Healthcare Power of [place]Living will [place] will Living will [place]Kn[place] CityLiving will HSchall CircleLiving will  Does patient want to make changes to medical advance directive?     No - Patient declined    Copy of [place] Chart? No - copy requested  No - copy requested No - copy requested Yes - validated most recent copy scanned in chart (See row information) No - copy requested     Current Medications (verified) Outpatient Encounter Medications as of 12/21/2021  Medication Sig   acetaminophen (TYLENOL) 500 MG tablet Take 500 mg by mouth every 6 (six) hours as needed.   atorvastatin (LIPITOR) 10 MG tablet Take 1 tablet (10 mg total) by mouth daily.   bumetanide (BUMEX) 2 MG tablet Take 1 tablet by mouth in the morning and at bedtime.   diclofenac Sodium (VOLTAREN) 1 % GEL Apply topically.   digoxin (LANOXIN) 0.125 MG tablet Take 125 mcg by mouth daily.   ELIQUIS 5 MG TABS tablet Take 5 mg by mouth 2 (two) times daily.   FARXIGA 10 MG TABS tablet Take 10 mg by mouth daily.   fluocinolone (SYNALAR) 0.01 % external solution  Apply topically.   fluorouracil (EFUDEX) 5 % cream Apply topically 2 (two) times daily. To scalp x 1 week - start Dec 20, 2021   furosemide (LASIX) 40 MG tablet Take 40 mg by mouth 2 (two) times daily.   hydrocortisone cream 1 % Apply topically.   hydrOXYzine (ATARAX) 25 MG tablet Take 25 mg by mouth 3 (three) times daily as needed.   ipratropium-albuterol (DUONEB) 0.5-2.5 (3) MG/3ML SOLN Take 3 mLs by nebulization 3 (three) times daily as needed (sob cough wheeze - use in place of albuterol nebs).   levalbuterol (XOPENEX) 1.25 MG/3ML nebulizer solution Take 1.25 mg by nebulization 3 (three) times daily as needed for wheezing.   loratadine (CLARITIN) 10 MG tablet Take 1 tablet by mouth daily.   magnesium oxide (MAG-OX) 400 MG tablet Take 1 tablet by mouth daily.   melatonin 3 MG TABS tablet Take 2 tablets by mouth every evening.   mometasone (ELOCON) 0.1 % cream Apply 1 application. topically daily. 5 times per week to rash and itch of neck   montelukast (SINGULAIR) 10 MG tablet Take 1 tablet (10 mg total) by mouth at bedtime.   pantoprazole (PROTONIX) 40 MG tablet Take 1 tablet by mouth daily.   Potassium Chloride ER 20 MEQ TBCR Take 1 tablet by mouth 2 (two) times daily.   Probiotic  Product (PROBIOTIC PO) Take by mouth daily.   tamsulosin (FLOMAX) 0.4 MG CAPS capsule TAKE 1 CAPSULE BY MOUTH  DAILY   traZODone (DESYREL) 150 MG tablet Take 1 tablet by mouth at bedtime.   No facility-administered encounter medications on file as of 12/21/2021.    Allergies (verified) Gramineae pollens, Propranolol, Cat hair extract, Breo ellipta [fluticasone furoate-vilanterol], and Rosuvastatin   History: Past Medical History:  Diagnosis Date   Actinic keratosis 11/20/2018   right proximal lateral bicep.   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 Deep margin involved. 2nd Excision 12/23/2018, no residual lesion, margins free   Carotid artery disease ([place])     Coronary artery disease 2005   COVID-19 09/01/2019   symptoms resolved 09/05/19   GERD (gastroesophageal reflux disease)    Hyperkalemia    Hyperlipidemia    Hypertension    Past heart attack 2005   Squamous cell carcinoma of skin 08/21/2018   Left distal dorsum forearm. WD [facility]. [facility]   Past Surgical History:  Procedure Laterality Date   CATARACT EXTRACTION W/PHACO Right 11/24/2019   Procedure: CATARACT EXTRACTION PHACO AND INTRAOCULAR LENS PLACEMENT (IOC) RIGHT MALYUGIN 8.59 00:50.8;  Surgeon: Birder Robson, MD;  Location: [place];  Service: Ophthalmology;  Laterality: Right;  COVID ( + ) 09-01-19   CATARACT EXTRACTION W/PHACO Left 12/15/2019   Procedure: CATARACT EXTRACTION PHACO AND INTRAOCULAR LENS PLACEMENT (IOC) LEFT 9.90  01:03.7;  Surgeon: Birder Robson, MD;  Location: [place];  Service: Ophthalmology;  Laterality: Left;   CORONARY ANGIOPLASTY WITH STENT PLACEMENT  2005   [facility]   Family History  Problem Relation Age of Onset   Hypertension Mother    Heart disease Mother    Heart disease Father    Hypertension Father    Social History   Socioeconomic History   Marital status: Widowed    Spouse name: Not on file   Number of children: 3   Years of education: Not on file   Highest education level: 10th grade  Occupational History   Occupation: retired   Tobacco Use   Smoking status: Former    Packs/day: 0.25    Years: 15.00    Pack years: 3.75    Types: Cigarettes    Quit date: 05/16/1963    Years since quitting: 58.6   Smokeless tobacco: Never   Tobacco comments:    quit over 50 years ago- 08/08/2021  Vaping Use   Vaping Use: Never used  Substance and Sexual Activity   Alcohol use: No   Drug use: No   Sexual activity: Not Currently    Partners: Female  Other Topics Concern   Not on file  Social History Narrative   Pt lives alone. Moved to [facility] 11/2021 assisted living   Social Determinants of Health   Financial Resource  Strain: Low Risk    Difficulty of Paying Living Expenses: Not hard at all  Food Insecurity: No Food Insecurity   Worried About Charity fundraiser in the Last Year: Never true   Ran Out of Food in the Last Year: Never true  Transportation Needs: No Transportation Needs   Lack of Transportation (Medical): No   Lack of Transportation (Non-Medical): No  Physical Activity: Sufficiently Active   Days of Exercise per Week: 7 days   Minutes of Exercise per Session: 30 min  Stress: No Stress Concern Present   Feeling of Stress : Not at all  Social  Connections: Moderately Isolated   Frequency of Communication with Friends and Family: More than three times a week   Frequency of Social Gatherings with Friends and Family: Three times a week   Attends Religious Services: 1 to 4 times per year   Active Member of Clubs or Organizations: No   Attends Archivist Meetings: Never   Marital Status: Widowed    Tobacco Counseling Counseling given: Not Answered Tobacco comments: quit over 50 years ago- 08/08/2021   Clinical Intake:  Pre-visit preparation completed: Yes  Pain : No/denies pain     BMI - recorded: 25.7 Nutritional Status: BMI 25 -29 Overweight Nutritional Risks: None Diabetes: No  How often do you need to have someone help you when you read instructions, pamphlets, or other written materials from your doctor or pharmacy?: 1 - Never   Interpreter Needed?: No  Information entered by :: Clemetine Marker LPN   Activities of Daily Living    12/21/2021   11:15 AM 12/21/2021   10:21 AM  In your present state of health, do you have any difficulty performing the following activities:  Hearing? 0 0  Vision? 0 0  Difficulty concentrating or making decisions? 1 1  Walking or climbing stairs? 1 1  Dressing or bathing? 1 1  Doing errands, shopping? 0 0  Preparing Food and eating ? N   Using the Toilet? N   In the past six months, have you accidently leaked urine? N   Do you  have problems with loss of bowel control? N   Managing your Medications? N   Managing your Finances? N   Housekeeping or managing your Housekeeping? N     Patient Care Team: Steele Sizer, MD as PCP - General (Family Medicine) Jeni Salles, MD as Consulting Physician (Ophthalmology) Delana Meyer, Dolores Lory, MD as Consulting Physician (Vascular Surgery) Corey Skains, MD as Consulting Physician (Cardiology) Ralene Bathe, MD (Dermatology) Venia Carbon, MD as Referring Physician (Pulmonary Disease)  Indicate any recent Medical Services you may have received from other than [facility] providers in the past year (date may be approximate).     Assessment:   This is a routine wellness examination for Rynell.  Hearing/Vision screen Hearing Screening - Comments:: Pt denies hearing difficulty Vision Screening - Comments:: Annual vision screenings with [facility]  Dietary issues and exercise activities discussed: Current Exercise Habits: Home exercise routine, Type of exercise: calisthenics;stretching, Time (Minutes): 30, Frequency (Times/Week): 7, Weekly Exercise (Minutes/Week): 210, Intensity: Mild, Exercise limited by: orthopedic condition(s)   Goals Addressed             This Visit's Progress    Increase water intake   On track    Starting 06/11/16, I will increase my water intake to 5-6 glasses a day.        Depression Screen    12/21/2021   11:14 AM 12/21/2021   10:21 AM 11/23/2021   11:21 AM 11/13/2021    9:52 AM 08/23/2021    1:49 PM 07/18/2021    8:46 AM 07/04/2021    9:51 AM  PHQ 2/9 Scores  PHQ - 2 Score 0 0 0 0 0 0 0  PHQ- 9 Score 0 0 0 0 0 0 0    Fall Risk    12/21/2021   11:15 AM 12/21/2021   10:21 AM 11/23/2021   11:23 AM 11/13/2021    9:51 AM 08/23/2021    1:49 PM  Fall Risk   Falls in the past  year? 0 0 1 0 0  Number falls in past yr: 0 0 0 0 0  Injury with Fall? 0 0 0 0 0  Risk for fall due to : No Fall Risks Impaired balance/gait  Impaired balance/gait No Fall Risks No Fall Risks  Follow up Falls prevention discussed Falls prevention discussed Falls prevention discussed;Falls evaluation completed;Education provided Falls prevention discussed;Education provided Falls prevention discussed    FALL RISK PREVENTION PERTAINING TO THE HOME:  Any stairs in or around the home? No  If so, are there any without handrails? No  Home free of loose throw rugs in walkways, pet beds, electrical cords, etc? Yes  Adequate lighting in your home to reduce risk of falls? Yes   ASSISTIVE DEVICES UTILIZED TO PREVENT FALLS:  Life alert? Yes Use of a cane, walker or w/c? Yes  Grab bars in the bathroom? Yes  Shower chair or bench in shower? Yes  Elevated toilet seat or a handicapped toilet? Yes   TIMED UP AND GO:  Was the test performed? Yes .  Length of time to ambulate 10 feet: 7 sec.   Gait steady and fast with assistive device  Cognitive Function: Normal cognitive status assessed by direct observation by this Nurse Health Advisor. No abnormalities found.          11/17/2019    8:33 AM 06/11/2016    9:08 AM  6CIT Screen  What Year? 0 points 0 points  What month? 0 points 0 points  What time? 0 points 0 points  Count back from 20 0 points 0 points  Months in reverse 0 points 0 points  Repeat phrase 0 points 2 points  Total Score 0 points 2 points    Immunizations Immunization History  Administered Date(s) Administered   Fluad Quad(high Dose 65+) 08/17/2019, 05/25/2021   Influenza, High Dose Seasonal PF 03/29/2015, 06/15/2016, 05/28/2018   Influenza-Unspecified 05/23/2017, 10/23/2018, 07/15/2020   PFIZER(Purple Top)SARS-COV-2 Vaccination 10/26/2019, 11/18/2019, 07/15/2020   PPD Test 10/25/2021   Pneumococcal Conjugate-13 06/11/2016   Pneumococcal Polysaccharide-23 11/21/2017   Tdap 07/28/2017   Zoster Recombinat (Shingrix) 01/12/2021, 04/14/2021   Zoster, Live 05/07/2007    TDAP status: Up to date  Flu  Vaccine status: Up to date  Pneumococcal vaccine status: Up to date  Covid-19 vaccine status: Completed vaccines  Qualifies for Shingles Vaccine? Yes   Zostavax completed Yes   Shingrix Completed?: Yes  Screening Tests Health Maintenance  Topic Date Due   URINE MICROALBUMIN  Never done   COVID-19 Vaccine (4 - Booster for Pfizer series) 09/09/2020   INFLUENZA VACCINE  02/27/2022   TETANUS/TDAP  07/29/2027   Pneumonia Vaccine 53+ Years old  Completed   Zoster Vaccines- Shingrix  Completed   HPV VACCINES  Aged Out    Health Maintenance  Health Maintenance Due  Topic Date Due   URINE MICROALBUMIN  Never done   COVID-19 Vaccine (4 - Booster for [place] series) 09/09/2020    Colorectal cancer screening: No longer required.   Lung Cancer Screening: (Low Dose CT Chest recommended if Age 16-80 years, 30 pack-year currently smoking OR have quit w/in 15years.) does not qualify.   Additional Screening:  Hepatitis C Screening: does not qualify  Vision Screening: Recommended annual ophthalmology exams for early detection of glaucoma and other disorders of the eye. Is the patient up to date with their annual eye exam?  Yes  Who is the provider or what is the name of the office in which the patient attends annual  eye exams? [facility].   Dental Screening: Recommended annual dental exams for proper oral hygiene  Community Resource Referral / Chronic Care Management: CRR required this visit?  No   CCM required this visit?  No      Plan:     I have personally reviewed and noted the following in the patient's chart:   Medical and social history Use of alcohol, tobacco or illicit drugs  Current medications and supplements including opioid prescriptions. Patient is not currently taking opioid prescriptions. Functional ability and status Nutritional status Physical activity Advanced directives List of other physicians Hospitalizations, surgeries, and ER visits in  previous 12 months Vitals Screenings to include cognitive, depression, and falls Referrals and appointments  In addition, I have reviewed and discussed with patient certain preventive protocols, quality metrics, and best practice recommendations. A written personalized care plan for preventive services as well as general preventive health recommendations were provided to patient.     Clemetine Marker, LPN   [birth year]   Nurse Notes: none

## 2021-12-21 NOTE — Assessment & Plan Note (Signed)
On statin therapy 

## 2021-12-21 NOTE — Patient Instructions (Signed)
Mark [name] , Thank you for taking time to come for your Medicare Wellness Visit. I appreciate your ongoing commitment to your health goals. Please review the following plan we discussed and let me know if I can assist you in the future.   Screening recommendations/referrals: Colonoscopy: no longer required Recommended yearly ophthalmology/optometry visit for glaucoma screening and checkup Recommended yearly dental visit for hygiene and checkup  Vaccinations: Influenza vaccine: done 05/25/21 Pneumococcal vaccine: done 11/21/17 Tdap vaccine: done 07/28/17 Shingles vaccine: done 01/12/21 & 04/14/21   Covid-19: done 10/26/19, 11/18/19 & 07/15/20  Advanced directives: Please bring a copy of your health care power of attorney and living will to the office at your convenience.   Conditions/risks identified: Keep up the great work!  Next appointment: Follow up in one year for your annual wellness visit.   Preventive Care 86 Years and Older, Male Preventive care refers to lifestyle choices and visits with your health care provider that can promote health and wellness. What does preventive care include? A yearly physical exam. This is also called an annual well check. Dental exams once or twice a year. Routine eye exams. Ask your health care provider how often you should have your eyes checked. Personal lifestyle choices, including: Daily care of your teeth and gums. Regular physical activity. Eating a healthy diet. Avoiding tobacco and drug use. Limiting alcohol use. Practicing safe sex. Taking low doses of aspirin every day. Taking vitamin and mineral supplements as recommended by your health care provider. What happens during an annual well check? The services and screenings done by your health care provider during your annual well check will depend on your age, overall health, lifestyle risk factors, and family history of disease. Counseling  Your health care provider may ask you  questions about your: Alcohol use. Tobacco use. Drug use. Emotional well-being. Home and relationship well-being. Sexual activity. Eating habits. History of falls. Memory and ability to understand (cognition). Work and work Statistician. Screening  You may have the following tests or measurements: Height, weight, and BMI. Blood pressure. Lipid and cholesterol levels. These may be checked every 5 years, or more frequently if you are over 35 years old. Skin check. Lung cancer screening. You may have this screening every year starting at age 86 if you have a 30-pack-year history of smoking and currently smoke or have quit within the past 15 years. Fecal occult blood test (FOBT) of the stool. You may have this test every year starting at age 86. Flexible sigmoidoscopy or colonoscopy. You may have a sigmoidoscopy every 5 years or a colonoscopy every 10 years starting at age 86. Prostate cancer screening. Recommendations will vary depending on your family history and other risks. Hepatitis C blood test. Hepatitis B blood test. Sexually transmitted disease (STD) testing. Diabetes screening. This is done by checking your blood sugar (glucose) after you have not eaten for a while (fasting). You may have this done every 1-3 years. Abdominal aortic aneurysm (AAA) screening. You may need this if you are a current or former smoker. Osteoporosis. You may be screened starting at age 86 if you are at high risk. Talk with your health care provider about your test results, treatment options, and if necessary, the need for more tests. Vaccines  Your health care provider may recommend certain vaccines, such as: Influenza vaccine. This is recommended every year. Tetanus, diphtheria, and acellular pertussis (Tdap, Td) vaccine. You may need a Td booster every 10 years. Zoster vaccine. You may need this after age  86. Pneumococcal 13-valent conjugate (PCV13) vaccine. One dose is recommended after age  86. Pneumococcal polysaccharide (PPSV23) vaccine. One dose is recommended after age 86. Talk to your health care provider about which screenings and vaccines you need and how often you need them. This information is not intended to replace advice given to you by your health care provider. Make sure you discuss any questions you have with your health care provider. Document Released: 08/12/2015 Document Revised: 04/04/2016 Document Reviewed: 05/17/2015 Elsevier Interactive Patient Education  2017 [place] Prevention in the Home Falls can cause injuries. They can happen to people of all ages. There are many things you can do to make your home safe and to help prevent falls. What can I do on the outside of my home? Regularly fix the edges of walkways and driveways and fix any cracks. Remove anything that might make you trip as you walk through a door, such as a raised step or threshold. Trim any bushes or trees on the path to your home. Use bright outdoor lighting. Clear any walking paths of anything that might make someone trip, such as rocks or tools. Regularly check to see if handrails are loose or broken. Make sure that both sides of any steps have handrails. Any raised decks and porches should have guardrails on the edges. Have any leaves, snow, or ice cleared regularly. Use sand or salt on walking paths during winter. Clean up any spills in your garage right away. This includes oil or grease spills. What can I do in the bathroom? Use night lights. Install grab bars by the toilet and in the tub and shower. Do not use towel bars as grab bars. Use non-skid mats or decals in the tub or shower. If you need to sit down in the shower, use a plastic, non-slip stool. Keep the floor dry. Clean up any water that spills on the floor as soon as it happens. Remove soap buildup in the tub or shower regularly. Attach bath mats securely with double-sided non-slip rug tape. Do not have throw  rugs and other things on the floor that can make you trip. What can I do in the bedroom? Use night lights. Make sure that you have a light by your bed that is easy to reach. Do not use any sheets or blankets that are too big for your bed. They should not hang down onto the floor. Have a firm chair that has side arms. You can use this for support while you get dressed. Do not have throw rugs and other things on the floor that can make you trip. What can I do in the kitchen? Clean up any spills right away. Avoid walking on wet floors. Keep items that you use a lot in easy-to-reach places. If you need to reach something above you, use a strong step stool that has a grab bar. Keep electrical cords out of the way. Do not use floor polish or wax that makes floors slippery. If you must use wax, use non-skid floor wax. Do not have throw rugs and other things on the floor that can make you trip. What can I do with my stairs? Do not leave any items on the stairs. Make sure that there are handrails on both sides of the stairs and use them. Fix handrails that are broken or loose. Make sure that handrails are as long as the stairways. Check any carpeting to make sure that it is firmly attached to the stairs. Fix  any carpet that is loose or worn. Avoid having throw rugs at the top or bottom of the stairs. If you do have throw rugs, attach them to the floor with carpet tape. Make sure that you have a light switch at the top of the stairs and the bottom of the stairs. If you do not have them, ask someone to add them for you. What else can I do to help prevent falls? Wear shoes that: Do not have high heels. Have rubber bottoms. Are comfortable and fit you well. Are closed at the toe. Do not wear sandals. If you use a stepladder: Make sure that it is fully opened. Do not climb a closed stepladder. Make sure that both sides of the stepladder are locked into place. Ask someone to hold it for you, if  possible. Clearly mark and make sure that you can see: Any grab bars or handrails. First and last steps. Where the edge of each step is. Use tools that help you move around (mobility aids) if they are needed. These include: Canes. Walkers. Scooters. Crutches. Turn on the lights when you go into a dark area. Replace any light bulbs as soon as they burn out. Set up your furniture so you have a clear path. Avoid moving your furniture around. If any of your floors are uneven, fix them. If there are any pets around you, be aware of where they are. Review your medicines with your doctor. Some medicines can make you feel dizzy. This can increase your chance of falling. Ask your doctor what other things that you can do to help prevent falls. This information is not intended to replace advice given to you by your health care provider. Make sure you discuss any questions you have with your health care provider. Document Released: 05/12/2009 Document Revised: 12/22/2015 Document Reviewed: 08/20/2014 Elsevier Interactive Patient Education  2017 [employer].

## 2021-12-21 NOTE — Assessment & Plan Note (Signed)
Went to [facility], not currently on medication for ILD

## 2021-12-21 NOTE — Assessment & Plan Note (Signed)
Under the care of Dr. Rockey Situ, taking medications as prescribed

## 2021-12-21 NOTE — Progress Notes (Signed)
Name: Mark [name]   MRN: [id number]    DOB: May 18, 1929   Date:12/21/2021       Progress Note  Subjective  Chief Complaint  Chief Complaint  Patient presents with   Follow-up    HPI  PAF/CHF/pulmonary fibrosis/Atherosclerosis of Aorta: patients health has declined rapidly in 2022 and worse in early 2023  with increase in  SOB followed by multiple EC visits and hospital stay. He is now living and [facility] and is gaining weight, still using a walker but feeling better, taking Demadex prn to control CHF   CHF: he is under the care of Dr. Nehemiah Massed. He had syncope earlier in 2021 seen by Dr. Scarlette Ar.  He has SOB with minor activity now . He status post STEMI and PCI with stent to mid LAD in 2005 . He had repeat Echo and Stress Myoview, previous Echo was 40 % and is down to 20-30 %. He is compliant with medications, he states he knows when to take extra dose of Demadex to control symptoms     BPH: doing well on Flomax, stable    Lung nodules/pulmonary fibrosis/COPD /Asthma: he is was seeing Dr. Mortimer Fries, but was seen recently at [facility] for pulmonary firbrosis. Notes states his symptoms likely from CHF, he continues to have SOB with activity, a cough that is bothersome , he used to smoke but quit over 50 years ago. No wheezing     Atherosclerosis aorta/Aneurysm of thoracic aorta: discussed results with patient, he is not interested in changing management , also discussed aneurysm of aorta, guidelines recommends repeat US in 2024 . He is still taking Atorvastatin , he is under the care of Dr. Erven Colla, he sees vascular surgeon once a year Unchanged   OA: both knees, left shoulder and hands, stable, using walker but mostly due to SOB   Malnutrition: he had lost 15 lbs but has gained 6 lbs since moved in to [facility] about one month ago    Senile purpura:both arms Patient Active Problem List   Diagnosis Date Noted   Vitamin D deficiency 12/21/2021   Other vitamin B12 deficiency anemias  12/21/2021   Seasonal allergic rhinitis 12/21/2021   Hypothyroidism 12/21/2021   ILD (interstitial lung disease) ([place]) 12/15/2021   Mild protein-calorie malnutrition ([place]) 11/23/2021   Paroxysmal atrial fibrillation ([place]) 11/14/2021   Tachycardia 09/29/2021   HFrEF (heart failure with reduced ejection fraction) ([place]) 12/02/2020   Cardiac syncope 09/22/2019   Fibrosis of lung ([place]) 09/29/2018   Senile purpura ([place]) 09/29/2018   Atherosclerosis of aorta ([place]) 09/29/2018   Abnormal ECG 06/16/2018   Thoracic aortic aneurysm without rupture ([place]) 05/28/2018   CHF (congestive heart failure), NYHA class III, chronic, systolic ([place]) 94/85/4627   Chronic HFrEF (heart failure with reduced ejection fraction) ([place]) 05/28/2018   BPH with obstruction/lower urinary tract symptoms 02/26/2018   Ischemic cardiomyopathy 06/11/2017   Benign essential HTN 05/22/2017   Bilateral carotid artery stenosis 05/22/2017   Neuropathy 01/28/2017   Right leg paresthesias 11/26/2016   Hypertension 08/27/2016   GERD (gastroesophageal reflux disease) 08/27/2016   History of nonmelanoma skin cancer 10/10/2015   Pain in right shoulder 09/13/2015   Osteoarthritis 07/06/2015   Hyperlipidemia 03/29/2015   Family history of type 2 diabetes mellitus in father 03/29/2015   Diverticulosis of intestine without perforation or abscess without bleeding 02/03/2015   DDD (degenerative disc disease), lumbar 03/03/2014   Intervertebral disc disorder with radiculopathy of lumbar region 03/03/2014   Lumbar radiculitis 03/03/2014  Lumbar stenosis with neurogenic claudication 03/03/2014   Arteriosclerosis of coronary artery 04/18/2012   S/P coronary artery stent placement 04/18/2012   Status post percutaneous transluminal coronary angioplasty 04/18/2012    Past Surgical History:  Procedure Laterality Date   CATARACT EXTRACTION W/PHACO Right 11/24/2019   Procedure: CATARACT EXTRACTION PHACO AND INTRAOCULAR LENS PLACEMENT (IOC)  RIGHT MALYUGIN 8.59 00:50.8;  Surgeon: Birder Robson, MD;  Location: [place];  Service: Ophthalmology;  Laterality: Right;  COVID ( + ) 09-01-19   CATARACT EXTRACTION W/PHACO Left 12/15/2019   Procedure: CATARACT EXTRACTION PHACO AND INTRAOCULAR LENS PLACEMENT (IOC) LEFT 9.90  01:03.7;  Surgeon: Birder Robson, MD;  Location: [place];  Service: Ophthalmology;  Laterality: Left;   CORONARY ANGIOPLASTY WITH STENT PLACEMENT  2005   [facility]    Family History  Problem Relation Age of Onset   Hypertension Mother    Heart disease Mother    Heart disease Father    Hypertension Father     Social History   Tobacco Use   Smoking status: Former    Packs/day: 0.25    Years: 15.00    Pack years: 3.75    Types: Cigarettes    Quit date: 05/16/1963    Years since quitting: 58.6   Smokeless tobacco: Never   Tobacco comments:    quit over 50 years ago- 08/08/2021  Substance Use Topics   Alcohol use: No     Current Outpatient Medications:    acetaminophen (TYLENOL) 500 MG tablet, Take 500 mg by mouth every 6 (six) hours as needed., Disp: , Rfl:    atorvastatin (LIPITOR) 10 MG tablet, Take 1 tablet (10 mg total) by mouth daily., Disp: 90 tablet, Rfl: 1   bumetanide (BUMEX) 2 MG tablet, Take 1 tablet by mouth in the morning and at bedtime., Disp: , Rfl:    digoxin (LANOXIN) 0.125 MG tablet, Take 125 mcg by mouth daily., Disp: , Rfl:    ELIQUIS 5 MG TABS tablet, Take 5 mg by mouth 2 (two) times daily., Disp: , Rfl:    FARXIGA 10 MG TABS tablet, Take 10 mg by mouth daily., Disp: , Rfl:    fluorouracil (EFUDEX) 5 % cream, Apply topically 2 (two) times daily. To scalp x 1 week - start Dec 20, 2021, Disp: 15 g, Rfl: 0   furosemide (LASIX) 40 MG tablet, Take 40 mg by mouth 2 (two) times daily., Disp: , Rfl:    hydrocortisone cream 1 %, Apply topically., Disp: , Rfl:    hydrOXYzine (ATARAX) 25 MG tablet, Take 25 mg by mouth 3 (three) times daily as needed., Disp: , Rfl:     ipratropium-albuterol (DUONEB) 0.5-2.5 (3) MG/3ML SOLN, Take 3 mLs by nebulization 3 (three) times daily as needed (sob cough wheeze - use in place of albuterol nebs)., Disp: 30 mL, Rfl: 0   levalbuterol (XOPENEX) 1.25 MG/3ML nebulizer solution, Take 1.25 mg by nebulization 3 (three) times daily as needed for wheezing., Disp: 90 mL, Rfl: 3   loratadine (CLARITIN) 10 MG tablet, Take 1 tablet by mouth daily., Disp: , Rfl:    magnesium oxide (MAG-OX) 400 MG tablet, Take 1 tablet by mouth daily., Disp: , Rfl:    melatonin 3 MG TABS tablet, Take 2 tablets by mouth every evening., Disp: , Rfl:    mometasone (ELOCON) 0.1 % cream, Apply 1 application. topically daily. 5 times per week to rash and itch of neck, Disp: 45 g, Rfl: 0   montelukast (SINGULAIR) 10 MG tablet,  Take 1 tablet (10 mg total) by mouth at bedtime., Disp: 90 tablet, Rfl: 1   pantoprazole (PROTONIX) 40 MG tablet, Take 1 tablet by mouth daily., Disp: , Rfl:    Potassium Chloride ER 20 MEQ TBCR, Take 1 tablet by mouth 2 (two) times daily., Disp: , Rfl:    Probiotic Product (PROBIOTIC PO), Take by mouth daily., Disp: , Rfl:    tamsulosin (FLOMAX) 0.4 MG CAPS capsule, TAKE 1 CAPSULE BY MOUTH  DAILY, Disp: 90 capsule, Rfl: 3   traZODone (DESYREL) 150 MG tablet, Take 1 tablet by mouth at bedtime., Disp: , Rfl:    diclofenac Sodium (VOLTAREN) 1 % GEL, Apply topically., Disp: , Rfl:    fluocinolone (SYNALAR) 0.01 % external solution, Apply topically., Disp: , Rfl:   Allergies  Allergen Reactions   Gramineae Pollens Itching and Shortness Of Breath    Sneezing   Propranolol Other (See Comments)    syncope   Cat Hair Extract Itching    Watery eyes and sneezing   Breo Ellipta [Fluticasone Furoate-Vilanterol] Rash    Patient stated that it made his mouth break out   Rosuvastatin Rash and Other (See Comments)    No reaction per pt, He saw some information regarding Crestor and adverse effects     I personally reviewed active problem list,  medication list, allergies, family history, social history, health maintenance with the patient/caregiver today.   ROS  Constitutional: Negative for fever, positive for  weight change.  Respiratory: positive for cough and shortness of breath.   Cardiovascular: Negative for chest pain or palpitations.  Gastrointestinal: Negative for abdominal pain, no bowel changes.  Musculoskeletal: positive for gait problem but no  joint swelling.  Skin: Negative for rash.  Neurological: Negative for dizziness or headache.  No other specific complaints in a complete review of systems (except as listed in HPI above).    Objective  Vitals:   12/21/21 1027  BP: 112/66  Pulse: 93  Resp: 16  SpO2: 96%  Weight: 169 lb (76.7 kg)  Height: _0  (1.727 m)    Body mass index is 25.7 kg/m.  Physical Exam  Constitutional: Patient appears well-developed and well-nourished.  No distress.  HEENT: head atraumatic, normocephalic, pupils equal and reactive to light, neck supple Cardiovascular: Normal rate, regular rhythm and normal heart sounds.  No murmur heard. Trace  BLE edema. Pulmonary/Chest: Effort normal and breath sounds normal. No respiratory distress. Abdominal: Soft.  There is no tenderness. Psychiatric: Patient has a normal mood and affect. behavior is normal. Judgment and thought content normal.   Recent Results (from the past 2160 hour(s))  Lipid panel     Status: None   Collection Time: 11/23/21 11:55 AM  Result Value Ref Range   Cholesterol 159 <200 mg/dL   HDL 48 > OR = 40 mg/dL   Triglycerides 101 <150 mg/dL   LDL Cholesterol (Calc) 92 mg/dL (calc)    Comment: Reference range: <100 . Desirable range <100 mg/dL for primary prevention;   <70 mg/dL for patients with CHD or diabetic patients  with > or = 2 CHD risk factors. Marland Kitchen LDL-C is now calculated using the Martin-Hopkins  calculation, which is a validated novel method providing  better accuracy than the Friedewald equation in the   estimation of LDL-C.  Cresenciano Genre et al. Annamaria Helling. 9518;841(66): 2061-2068  (http://education.[facility].com/faq/FAQ164)    Total CHOL/HDL Ratio 3.3 <5.0 (calc)   Non-HDL Cholesterol (Calc) 111 <130 mg/dL (calc)    Comment: For patients with  diabetes plus 1 major ASCVD risk  factor, treating to a non-HDL-C goal of <100 mg/dL  (LDL-C of <70 mg/dL) is considered a therapeutic  option.   CBC with Differential/Platelet     Status: Abnormal   Collection Time: 11/23/21 11:55 AM  Result Value Ref Range   WBC 11.1 (H) 3.8 - 10.8 Thousand/uL   RBC 4.75 4.20 - 5.80 Million/uL   Hemoglobin 15.0 13.2 - 17.1 g/dL   HCT 45.2 38.5 - 50.0 %   MCV 95.2 80.0 - 100.0 fL   MCH 31.6 27.0 - 33.0 pg   MCHC 33.2 32.0 - 36.0 g/dL   RDW 11.6 11.0 - 15.0 %   Platelets 314 140 - 400 Thousand/uL   MPV 9.8 7.5 - 12.5 fL   Neutro Abs 7,393 1,500 - 7,800 cells/uL   Lymphs Abs 2,453 850 - 3,900 cells/uL   Absolute Monocytes 1,154 (H) 200 - 950 cells/uL   Eosinophils Absolute 78 15 - 500 cells/uL   Basophils Absolute 22 0 - 200 cells/uL   Neutrophils Relative % 66.6 %   Total Lymphocyte 22.1 %   Monocytes Relative 10.4 %   Eosinophils Relative 0.7 %   Basophils Relative 0.2 %  COMPLETE METABOLIC PANEL WITH GFR     Status: Abnormal   Collection Time: 11/23/21 11:55 AM  Result Value Ref Range   Glucose, Bld 109 (H) 65 - 99 mg/dL    Comment: .            Fasting reference interval . For someone without known diabetes, a glucose value between 100 and 125 mg/dL is consistent with prediabetes and should be confirmed with a follow-up test. .    BUN 22 7 - 25 mg/dL   Creat 1.20 0.70 - 1.22 mg/dL   eGFR 57 (L) > OR = 60 mL/min/1.45m    Comment: The eGFR is based on the CKD-EPI 2021 equation. To calculate  the new eGFR from a previous Creatinine or Cystatin C result, go to https://www.kidney.org/professionals/ kdoqi/gfr%5Fcalculator    BUN/Creatinine Ratio NOT APPLICABLE 6 - 22 (calc)   Sodium 137 135  - 146 mmol/L   Potassium 5.3 3.5 - 5.3 mmol/L   Chloride 98 98 - 110 mmol/L   CO2 26 20 - 32 mmol/L   Calcium 9.6 8.6 - 10.3 mg/dL   Total Protein 7.0 6.1 - 8.1 g/dL   Albumin 4.0 3.6 - 5.1 g/dL   Globulin 3.0 1.9 - 3.7 g/dL (calc)   AG Ratio 1.3 1.0 - 2.5 (calc)   Total Bilirubin 1.0 0.2 - 1.2 mg/dL   Alkaline phosphatase (APISO) 79 35 - 144 U/L   AST 19 10 - 35 U/L   ALT 15 9 - 46 U/L  QuantiFERON-TB Gold Plus     Status: None   Collection Time: 11/23/21 11:55 AM  Result Value Ref Range   QuantiFERON-TB Gold Plus NEGATIVE NEGATIVE    Comment: Negative test result. M. tuberculosis complex  infection unlikely.    NIL 0.06 IU/mL   Mitogen-NIL >10.00 IU/mL   TB1-NIL 0.01 IU/mL   TB2-NIL 0.01 IU/mL    Comment: . The Nil tube value reflects the background interferon gamma immune response of the patient's blood sample. This value has been subtracted from the patient's displayed TB and Mitogen results. . Lower than expected results with the Mitogen tube prevent false-negative Quantiferon readings by detecting a patient with a potential immune suppressive condition and/or suboptimal pre-analytical specimen handling. . The TB1 Antigen tube is coated  with the M. tuberculosis-specific antigens designed to elicit responses from TB antigen primed CD4+ helper T-lymphocytes. . The TB2 Antigen tube is coated with the M. tuberculosis-specific antigens designed to elicit responses from TB antigen primed CD4+ helper and CD8+ cytotoxic T-lymphocytes. . For additional information, please refer to https://education.questdiagnostics.com/faq/FAQ204 (This link is being provided for informational/ educational purposes only.) .   POCT Urinalysis Dipstick     Status: None   Collection Time: 11/23/21  3:00 PM  Result Value Ref Range   Color, UA Yellow    Clarity, UA Clear    Glucose, UA Negative Negative   Bilirubin, UA Negative    Ketones, UA Negative    Spec Grav, UA 1.010 1.010 -  1.025   Blood, UA Negative    pH, UA 7.0 5.0 - 8.0   Protein, UA Negative Negative   Urobilinogen, UA 0.2 0.2 or 1.0 E.U./dL   Nitrite, UA Negative    Leukocytes, UA Negative Negative   Appearance     Odor    Urine Culture     Status: None   Collection Time: 11/23/21  3:14 PM   Specimen: Urine  Result Value Ref Range   MICRO NUMBER: [id number]    SPECIMEN QUALITY: Adequate    Sample Source URINE    STATUS: FINAL    ISOLATE 1:      Mixed genital flora isolated. These superficial bacteria are not indicative of a urinary tract infection. No further organism identification is warranted on this specimen. If clinically indicated, recollect clean-catch, mid-stream urine and transfer  immediately to Urine Culture Transport Tube.      PHQ2/9:    12/21/2021   11:14 AM 12/21/2021   10:21 AM 11/23/2021   11:21 AM 11/13/2021    9:52 AM 08/23/2021    1:49 PM  Depression screen PHQ 2/9  Decreased Interest 0 0 0 0 0  Down, Depressed, Hopeless 0 0 0 0 0  PHQ - 2 Score 0 0 0 0 0  Altered sleeping 0 0 0 0 0  Tired, decreased energy 0 0 0 0 0  Change in appetite 0 0 0 0 0  Feeling bad or failure about yourself  0 0 0 0 0  Trouble concentrating 0 0 0 0 0  Moving slowly or fidgety/restless 0 0 0 0 0  Suicidal thoughts 0 0 0 0 0  PHQ-9 Score 0 0 0 0 0  Difficult doing work/chores Not difficult at all   Not difficult at all     phq 9 is negative   Fall Risk:    12/21/2021   11:15 AM 12/21/2021   10:21 AM 11/23/2021   11:23 AM 11/13/2021    9:51 AM 08/23/2021    1:49 PM  Fall Risk   Falls in the past year? 0 0 1 0 0  Number falls in past yr: 0 0 0 0 0  Injury with Fall? 0 0 0 0 0  Risk for fall due to : No Fall Risks Impaired balance/gait Impaired balance/gait No Fall Risks No Fall Risks  Follow up Falls prevention discussed Falls prevention discussed Falls prevention discussed;Falls evaluation completed;Education provided Falls prevention discussed;Education provided Falls prevention  discussed     Functional Status Survey: Is the patient deaf or have difficulty hearing?: No Does the patient have difficulty seeing, even when wearing glasses/contacts?: No Does the patient have difficulty concentrating, remembering, or making decisions?: Yes Does the patient have difficulty walking or climbing stairs?: Yes Does the patient  have difficulty dressing or bathing?: Yes Does the patient have difficulty doing errands alone such as visiting a doctor's office or shopping?: No    Assessment & Plan  Problem List Items Addressed This Visit       Senile purpura ([place])      Arms, reassurance given           Relevant Medications    furosemide (LASIX) 40 MG tablet    Atherosclerosis of aorta (HCC)      On statin therapy           Relevant Medications    furosemide (LASIX) 40 MG tablet    HFrEF (heart failure with reduced ejection fraction) (HCC)      Under the care of Dr. Rockey Situ, taking medications as prescribed          Relevant Medications    furosemide (LASIX) 40 MG tablet    Mild protein-calorie malnutrition ([place])      Gaining weight since he moved in to [facility] 10/2021          ILD (interstitial lung disease) ([place])      Went to [facility], not currently on medication for ILD          Paroxysmal atrial fibrillation (HCC) - Primary    Relevant Medications    furosemide (LASIX) 40 MG tablet    Fibrosis of lung (HCC)    Benign essential HTN    Relevant Medications    furosemide (LASIX) 40 MG tablet

## 2021-12-27 DIAGNOSIS — R2681 Unsteadiness on feet: Secondary | ICD-10-CM | POA: Diagnosis not present

## 2021-12-27 DIAGNOSIS — M6281 Muscle weakness (generalized): Secondary | ICD-10-CM | POA: Diagnosis not present

## 2021-12-28 DIAGNOSIS — I509 Heart failure, unspecified: Secondary | ICD-10-CM | POA: Diagnosis not present

## 2021-12-28 DIAGNOSIS — D518 Other vitamin B12 deficiency anemias: Secondary | ICD-10-CM | POA: Diagnosis not present

## 2021-12-28 DIAGNOSIS — E785 Hyperlipidemia, unspecified: Secondary | ICD-10-CM | POA: Diagnosis not present

## 2021-12-28 DIAGNOSIS — E038 Other specified hypothyroidism: Secondary | ICD-10-CM | POA: Diagnosis not present

## 2021-12-28 DIAGNOSIS — N400 Enlarged prostate without lower urinary tract symptoms: Secondary | ICD-10-CM | POA: Diagnosis not present

## 2021-12-28 DIAGNOSIS — E119 Type 2 diabetes mellitus without complications: Secondary | ICD-10-CM | POA: Diagnosis not present

## 2021-12-28 DIAGNOSIS — I2510 Atherosclerotic heart disease of native coronary artery without angina pectoris: Secondary | ICD-10-CM | POA: Diagnosis not present

## 2021-12-28 DIAGNOSIS — M1990 Unspecified osteoarthritis, unspecified site: Secondary | ICD-10-CM | POA: Diagnosis not present

## 2021-12-28 DIAGNOSIS — I480 Paroxysmal atrial fibrillation: Secondary | ICD-10-CM | POA: Diagnosis not present

## 2021-12-28 DIAGNOSIS — J45909 Unspecified asthma, uncomplicated: Secondary | ICD-10-CM | POA: Diagnosis not present

## 2021-12-28 DIAGNOSIS — J449 Chronic obstructive pulmonary disease, unspecified: Secondary | ICD-10-CM | POA: Diagnosis not present

## 2021-12-28 DIAGNOSIS — E559 Vitamin D deficiency, unspecified: Secondary | ICD-10-CM | POA: Diagnosis not present

## 2021-12-29 DIAGNOSIS — R2681 Unsteadiness on feet: Secondary | ICD-10-CM | POA: Diagnosis not present

## 2021-12-29 DIAGNOSIS — M6281 Muscle weakness (generalized): Secondary | ICD-10-CM | POA: Diagnosis not present

## 2022-01-03 ENCOUNTER — Telehealth: Payer: Self-pay

## 2022-01-03 NOTE — Telephone Encounter (Signed)
1 RF of skin medicinals rosacea cream sent in. Patient scheduled for follow up in July.

## 2022-01-10 ENCOUNTER — Ambulatory Visit: Payer: Medicare Other | Admitting: Family Medicine

## 2022-01-18 DIAGNOSIS — I5022 Chronic systolic (congestive) heart failure: Secondary | ICD-10-CM | POA: Diagnosis not present

## 2022-01-18 DIAGNOSIS — E785 Hyperlipidemia, unspecified: Secondary | ICD-10-CM | POA: Diagnosis not present

## 2022-01-18 DIAGNOSIS — I2510 Atherosclerotic heart disease of native coronary artery without angina pectoris: Secondary | ICD-10-CM | POA: Diagnosis not present

## 2022-01-18 DIAGNOSIS — J45909 Unspecified asthma, uncomplicated: Secondary | ICD-10-CM | POA: Diagnosis not present

## 2022-01-18 DIAGNOSIS — R0989 Other specified symptoms and signs involving the circulatory and respiratory systems: Secondary | ICD-10-CM | POA: Diagnosis not present

## 2022-01-18 DIAGNOSIS — M1990 Unspecified osteoarthritis, unspecified site: Secondary | ICD-10-CM | POA: Diagnosis not present

## 2022-01-18 DIAGNOSIS — G4700 Insomnia, unspecified: Secondary | ICD-10-CM | POA: Diagnosis not present

## 2022-01-18 DIAGNOSIS — I480 Paroxysmal atrial fibrillation: Secondary | ICD-10-CM | POA: Diagnosis not present

## 2022-01-18 DIAGNOSIS — I509 Heart failure, unspecified: Secondary | ICD-10-CM | POA: Diagnosis not present

## 2022-01-18 DIAGNOSIS — J302 Other seasonal allergic rhinitis: Secondary | ICD-10-CM | POA: Diagnosis not present

## 2022-01-18 DIAGNOSIS — J8410 Pulmonary fibrosis, unspecified: Secondary | ICD-10-CM | POA: Diagnosis not present

## 2022-01-18 DIAGNOSIS — R5381 Other malaise: Secondary | ICD-10-CM | POA: Diagnosis not present

## 2022-01-18 DIAGNOSIS — J449 Chronic obstructive pulmonary disease, unspecified: Secondary | ICD-10-CM | POA: Diagnosis not present

## 2022-01-19 DIAGNOSIS — M2012 Hallux valgus (acquired), left foot: Secondary | ICD-10-CM | POA: Diagnosis not present

## 2022-01-19 DIAGNOSIS — M79672 Pain in left foot: Secondary | ICD-10-CM | POA: Diagnosis not present

## 2022-01-19 DIAGNOSIS — M2042 Other hammer toe(s) (acquired), left foot: Secondary | ICD-10-CM | POA: Diagnosis not present

## 2022-01-19 DIAGNOSIS — M79671 Pain in right foot: Secondary | ICD-10-CM | POA: Diagnosis not present

## 2022-01-19 DIAGNOSIS — M21622 Bunionette of left foot: Secondary | ICD-10-CM | POA: Diagnosis not present

## 2022-01-19 DIAGNOSIS — B351 Tinea unguium: Secondary | ICD-10-CM | POA: Diagnosis not present

## 2022-01-19 DIAGNOSIS — R52 Pain, unspecified: Secondary | ICD-10-CM | POA: Diagnosis not present

## 2022-01-19 DIAGNOSIS — M2030 Hallux varus (acquired), unspecified foot: Secondary | ICD-10-CM | POA: Diagnosis not present

## 2022-01-19 DIAGNOSIS — L600 Ingrowing nail: Secondary | ICD-10-CM | POA: Diagnosis not present

## 2022-01-19 DIAGNOSIS — M2041 Other hammer toe(s) (acquired), right foot: Secondary | ICD-10-CM | POA: Diagnosis not present

## 2022-01-19 DIAGNOSIS — M2011 Hallux valgus (acquired), right foot: Secondary | ICD-10-CM | POA: Diagnosis not present

## 2022-01-19 DIAGNOSIS — G609 Hereditary and idiopathic neuropathy, unspecified: Secondary | ICD-10-CM | POA: Diagnosis not present

## 2022-01-19 DIAGNOSIS — M21621 Bunionette of right foot: Secondary | ICD-10-CM | POA: Diagnosis not present

## 2022-01-21 DIAGNOSIS — I10 Essential (primary) hypertension: Secondary | ICD-10-CM | POA: Diagnosis not present

## 2022-01-27 DIAGNOSIS — D518 Other vitamin B12 deficiency anemias: Secondary | ICD-10-CM | POA: Diagnosis not present

## 2022-01-27 DIAGNOSIS — E785 Hyperlipidemia, unspecified: Secondary | ICD-10-CM | POA: Diagnosis not present

## 2022-01-27 DIAGNOSIS — E038 Other specified hypothyroidism: Secondary | ICD-10-CM | POA: Diagnosis not present

## 2022-01-27 DIAGNOSIS — I2510 Atherosclerotic heart disease of native coronary artery without angina pectoris: Secondary | ICD-10-CM | POA: Diagnosis not present

## 2022-01-27 DIAGNOSIS — E559 Vitamin D deficiency, unspecified: Secondary | ICD-10-CM | POA: Diagnosis not present

## 2022-01-27 DIAGNOSIS — N400 Enlarged prostate without lower urinary tract symptoms: Secondary | ICD-10-CM | POA: Diagnosis not present

## 2022-01-27 DIAGNOSIS — J45909 Unspecified asthma, uncomplicated: Secondary | ICD-10-CM | POA: Diagnosis not present

## 2022-01-27 DIAGNOSIS — J449 Chronic obstructive pulmonary disease, unspecified: Secondary | ICD-10-CM | POA: Diagnosis not present

## 2022-01-27 DIAGNOSIS — M1990 Unspecified osteoarthritis, unspecified site: Secondary | ICD-10-CM | POA: Diagnosis not present

## 2022-01-27 DIAGNOSIS — E119 Type 2 diabetes mellitus without complications: Secondary | ICD-10-CM | POA: Diagnosis not present

## 2022-01-27 DIAGNOSIS — I509 Heart failure, unspecified: Secondary | ICD-10-CM | POA: Diagnosis not present

## 2022-01-27 DIAGNOSIS — I480 Paroxysmal atrial fibrillation: Secondary | ICD-10-CM | POA: Diagnosis not present

## 2022-02-02 DIAGNOSIS — M75122 Complete rotator cuff tear or rupture of left shoulder, not specified as traumatic: Secondary | ICD-10-CM | POA: Diagnosis not present

## 2022-02-08 DIAGNOSIS — I5022 Chronic systolic (congestive) heart failure: Secondary | ICD-10-CM | POA: Diagnosis not present

## 2022-02-08 DIAGNOSIS — I2510 Atherosclerotic heart disease of native coronary artery without angina pectoris: Secondary | ICD-10-CM | POA: Diagnosis not present

## 2022-02-08 DIAGNOSIS — I480 Paroxysmal atrial fibrillation: Secondary | ICD-10-CM | POA: Diagnosis not present

## 2022-02-09 ENCOUNTER — Other Ambulatory Visit (INDEPENDENT_AMBULATORY_CARE_PROVIDER_SITE_OTHER): Payer: Self-pay | Admitting: Vascular Surgery

## 2022-02-09 DIAGNOSIS — I6523 Occlusion and stenosis of bilateral carotid arteries: Secondary | ICD-10-CM

## 2022-02-11 NOTE — Progress Notes (Deleted)
MRN : [id number]  Mark [name] is a 88 y.o. (03/11/1929) male who presents with chief complaint of check carotid arteries.  History of Present Illness:   The patient is seen for follow up evaluation of carotid stenosis. The carotid stenosis followed by ultrasound.   The patient denies amaurosis fugax. There is no recent history of TIA symptoms or focal motor deficits. There is no prior documented CVA.   The patient is taking enteric-coated aspirin 81 mg daily.   There is no history of migraine headaches. There is no history of seizures.   The patient has a history of coronary artery disease, no recent episodes of angina or shortness of breath. The patient denies PAD or claudication symptoms. There is a history of hyperlipidemia which is being treated with a statin.    Duplex ultrasound of the carotid arteries obtained 02/06/2021 demonstrates 1-39% bilateral ICA stenosis (string sign noted in the right ECA).  No outpatient medications have been marked as taking for the 02/12/22 encounter (Appointment) with Delana Meyer, Dolores Lory, MD.    Past Medical History:  Diagnosis Date   Actinic keratosis 11/20/2018   right proximal lateral bicep.   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 Deep margin involved. 2nd Excision 12/23/2018, no residual lesion, margins free   Carotid artery disease ([place])    Coronary artery disease 2005   COVID-19 09/01/2019   symptoms resolved 09/05/19   GERD (gastroesophageal reflux disease)    Hyperkalemia    Hyperlipidemia    Hypertension    Past heart attack 2005   Squamous cell carcinoma of skin 08/21/2018   Left distal dorsum forearm. WD SCC. [facility]    Past Surgical History:  Procedure Laterality Date   CATARACT EXTRACTION W/PHACO Right 11/24/2019   Procedure: CATARACT EXTRACTION PHACO AND INTRAOCULAR LENS PLACEMENT (IOC) RIGHT MALYUGIN 8.59 00:50.8;  Surgeon: Birder Robson, MD;  Location: [place];  Service: Ophthalmology;  Laterality: Right;  COVID ( + ) 09-01-19   CATARACT EXTRACTION W/PHACO Left 12/15/2019   Procedure: CATARACT EXTRACTION PHACO AND INTRAOCULAR LENS PLACEMENT (IOC) LEFT 9.90  01:03.7;  Surgeon: Birder Robson, MD;  Location: [place];  Service: Ophthalmology;  Laterality: Left;   CORONARY ANGIOPLASTY WITH STENT PLACEMENT  2005   [facility]    Social History Social History   Tobacco Use   Smoking status: Former    Packs/day: 0.25    Years: 15.00    Total pack years: 3.75    Types: Cigarettes    Quit date: 05/16/1963    Years since quitting: 58.7   Smokeless tobacco: Never   Tobacco comments:    quit over 50 years ago- 08/08/2021  Vaping Use   Vaping Use: Never used  Substance Use Topics   Alcohol use: No   Drug use: No    Family History Family History  Problem Relation Age of Onset   Hypertension Mother    Heart disease Mother    Heart disease Father    Hypertension Father     Allergies  Allergen Reactions   Gramineae Pollens Itching and Shortness Of Breath    Sneezing   Propranolol Other (See Comments)    syncope   Cat Hair Extract Itching    Watery eyes and sneezing   Breo Ellipta [Fluticasone Furoate-Vilanterol] Rash    Patient stated that it made his mouth break out   Rosuvastatin Rash and Other (See Comments)  No reaction per pt, He saw some information regarding Crestor and adverse effects      REVIEW OF SYSTEMS (Negative unless checked)  Constitutional: '[]'$ Weight loss  '[]'$ Fever  '[]'$ Chills Cardiac: '[]'$ Chest pain   '[]'$ Chest pressure   '[]'$ Palpitations   '[]'$ Shortness of breath when laying flat   '[]'$ Shortness of breath with exertion. Vascular:  '[x]'$ Pain in legs with walking   '[]'$ Pain in legs at rest  '[]'$ History of DVT   '[]'$ Phlebitis   '[]'$ Swelling in legs   '[]'$ Varicose veins   '[]'$ Non-healing ulcers Pulmonary:   '[]'$ Uses home oxygen   '[]'$ Productive cough   '[]'$ Hemoptysis   '[]'$ Wheeze  '[]'$ COPD   '[]'$ Asthma Neurologic:  '[]'$ Dizziness   '[]'$ Seizures    '[]'$ History of stroke   '[]'$ History of TIA  '[]'$ Aphasia   '[]'$ Vissual changes   '[]'$ Weakness or numbness in arm   '[]'$ Weakness or numbness in leg Musculoskeletal:   '[]'$ Joint swelling   '[]'$ Joint pain   '[]'$ Low back pain Hematologic:  '[]'$ Easy bruising  '[]'$ Easy bleeding   '[]'$ Hypercoagulable state   '[]'$ Anemic Gastrointestinal:  '[]'$ Diarrhea   '[]'$ Vomiting  '[x]'$ Gastroesophageal reflux/heartburn   '[]'$ Difficulty swallowing. Genitourinary:  '[]'$ Chronic kidney disease   '[]'$ Difficult urination  '[]'$ Frequent urination   '[]'$ Blood in urine Skin:  '[]'$ Rashes   '[]'$ Ulcers  Psychological:  '[]'$ History of anxiety   '[]'$  History of major depression.  Physical Examination  There were no vitals filed for this visit. There is no height or weight on file to calculate BMI. Gen: WD/WN, NAD Head: [place]/AT, No temporalis wasting.  Ear/Nose/Throat: Hearing grossly intact, nares w/o erythema or drainage Eyes: PER, EOMI, sclera nonicteric.  Neck: Supple, no masses.  No bruit or JVD.  Pulmonary:  Good air movement, no audible wheezing, no use of accessory muscles.  Cardiac: RRR, normal S1, S2, no Murmurs. Vascular:  carotid bruit noted Vessel Right Left  Radial Palpable Palpable  Carotid  Palpable  Palpable  PT  Palpable Palpable  Gastrointestinal: soft, non-distended. No guarding/no peritoneal signs.  Musculoskeletal: M/S 5/5 throughout.  No visible deformity.  Neurologic: CN 2-12 intact. Pain and light touch intact in extremities.  Symmetrical.  Speech is fluent. Motor exam as listed above. Psychiatric: Judgment intact, Mood & affect appropriate for pt's clinical situation. Dermatologic: No rashes or ulcers noted.  No changes consistent with cellulitis.   CBC Lab Results  Component Value Date   WBC 11.1 (H) 11/23/2021   HGB 15.0 11/23/2021   HCT 45.2 11/23/2021   MCV 95.2 11/23/2021   PLT 314 11/23/2021    BMET    Component Value Date/Time   NA 137 11/23/2021 1155   NA 140 08/05/2015 1233   K 5.3 11/23/2021 1155   CL 98 11/23/2021 1155    CO2 26 11/23/2021 1155   GLUCOSE 109 (H) 11/23/2021 1155   BUN 22 11/23/2021 1155   BUN 15 08/05/2015 1233   CREATININE 1.20 11/23/2021 1155   CALCIUM 9.6 11/23/2021 1155   GFRNONAA >60 07/14/2021 1318   GFRNONAA 69 12/30/2020 1122   GFRAA 80 12/30/2020 1122   CrCl cannot be calculated (Patient's most recent lab result is older than the maximum 21 days allowed.).  COAG No results found for: "INR", "PROTIME"  Radiology No results found.   Assessment/Plan There are no diagnoses linked to this encounter.   Hortencia Pilar, MD  02/11/2022 4:04 PM

## 2022-02-12 ENCOUNTER — Ambulatory Visit (INDEPENDENT_AMBULATORY_CARE_PROVIDER_SITE_OTHER): Payer: Medicare Other | Admitting: Vascular Surgery

## 2022-02-12 ENCOUNTER — Encounter (INDEPENDENT_AMBULATORY_CARE_PROVIDER_SITE_OTHER): Payer: Medicare Other

## 2022-02-12 DIAGNOSIS — I700 Atherosclerosis of aorta: Secondary | ICD-10-CM

## 2022-02-12 DIAGNOSIS — I2510 Atherosclerotic heart disease of native coronary artery without angina pectoris: Secondary | ICD-10-CM

## 2022-02-12 DIAGNOSIS — I6523 Occlusion and stenosis of bilateral carotid arteries: Secondary | ICD-10-CM

## 2022-02-12 DIAGNOSIS — E7800 Pure hypercholesterolemia, unspecified: Secondary | ICD-10-CM

## 2022-02-12 DIAGNOSIS — I10 Essential (primary) hypertension: Secondary | ICD-10-CM

## 2022-02-12 DIAGNOSIS — I480 Paroxysmal atrial fibrillation: Secondary | ICD-10-CM

## 2022-02-15 ENCOUNTER — Ambulatory Visit: Payer: Medicare HMO | Admitting: Dermatology

## 2022-02-20 DIAGNOSIS — I10 Essential (primary) hypertension: Secondary | ICD-10-CM | POA: Diagnosis not present

## 2022-02-21 DIAGNOSIS — I482 Chronic atrial fibrillation, unspecified: Secondary | ICD-10-CM | POA: Diagnosis not present

## 2022-02-22 DIAGNOSIS — R5381 Other malaise: Secondary | ICD-10-CM | POA: Diagnosis not present

## 2022-02-22 DIAGNOSIS — L309 Dermatitis, unspecified: Secondary | ICD-10-CM | POA: Diagnosis not present

## 2022-02-22 DIAGNOSIS — I509 Heart failure, unspecified: Secondary | ICD-10-CM | POA: Diagnosis not present

## 2022-02-22 DIAGNOSIS — I2510 Atherosclerotic heart disease of native coronary artery without angina pectoris: Secondary | ICD-10-CM | POA: Diagnosis not present

## 2022-02-22 DIAGNOSIS — J302 Other seasonal allergic rhinitis: Secondary | ICD-10-CM | POA: Diagnosis not present

## 2022-02-22 DIAGNOSIS — N400 Enlarged prostate without lower urinary tract symptoms: Secondary | ICD-10-CM | POA: Diagnosis not present

## 2022-02-22 DIAGNOSIS — J8410 Pulmonary fibrosis, unspecified: Secondary | ICD-10-CM | POA: Diagnosis not present

## 2022-02-22 DIAGNOSIS — K219 Gastro-esophageal reflux disease without esophagitis: Secondary | ICD-10-CM | POA: Diagnosis not present

## 2022-02-22 DIAGNOSIS — J45909 Unspecified asthma, uncomplicated: Secondary | ICD-10-CM | POA: Diagnosis not present

## 2022-02-22 DIAGNOSIS — I480 Paroxysmal atrial fibrillation: Secondary | ICD-10-CM | POA: Diagnosis not present

## 2022-02-27 DIAGNOSIS — I2510 Atherosclerotic heart disease of native coronary artery without angina pectoris: Secondary | ICD-10-CM | POA: Diagnosis not present

## 2022-02-27 DIAGNOSIS — D518 Other vitamin B12 deficiency anemias: Secondary | ICD-10-CM | POA: Diagnosis not present

## 2022-02-27 DIAGNOSIS — N400 Enlarged prostate without lower urinary tract symptoms: Secondary | ICD-10-CM | POA: Diagnosis not present

## 2022-02-27 DIAGNOSIS — E785 Hyperlipidemia, unspecified: Secondary | ICD-10-CM | POA: Diagnosis not present

## 2022-02-27 DIAGNOSIS — J449 Chronic obstructive pulmonary disease, unspecified: Secondary | ICD-10-CM | POA: Diagnosis not present

## 2022-02-27 DIAGNOSIS — M1990 Unspecified osteoarthritis, unspecified site: Secondary | ICD-10-CM | POA: Diagnosis not present

## 2022-02-27 DIAGNOSIS — I480 Paroxysmal atrial fibrillation: Secondary | ICD-10-CM | POA: Diagnosis not present

## 2022-02-27 DIAGNOSIS — E119 Type 2 diabetes mellitus without complications: Secondary | ICD-10-CM | POA: Diagnosis not present

## 2022-02-27 DIAGNOSIS — J45909 Unspecified asthma, uncomplicated: Secondary | ICD-10-CM | POA: Diagnosis not present

## 2022-02-27 DIAGNOSIS — E559 Vitamin D deficiency, unspecified: Secondary | ICD-10-CM | POA: Diagnosis not present

## 2022-02-27 DIAGNOSIS — E038 Other specified hypothyroidism: Secondary | ICD-10-CM | POA: Diagnosis not present

## 2022-02-27 DIAGNOSIS — I509 Heart failure, unspecified: Secondary | ICD-10-CM | POA: Diagnosis not present

## 2022-03-19 ENCOUNTER — Ambulatory Visit: Payer: Medicare HMO | Admitting: Dermatology

## 2022-03-19 DIAGNOSIS — Z1283 Encounter for screening for malignant neoplasm of skin: Secondary | ICD-10-CM

## 2022-03-19 DIAGNOSIS — L578 Other skin changes due to chronic exposure to nonionizing radiation: Secondary | ICD-10-CM | POA: Diagnosis not present

## 2022-03-19 DIAGNOSIS — L570 Actinic keratosis: Secondary | ICD-10-CM | POA: Diagnosis not present

## 2022-03-19 DIAGNOSIS — D485 Neoplasm of uncertain behavior of skin: Secondary | ICD-10-CM

## 2022-03-19 DIAGNOSIS — L820 Inflamed seborrheic keratosis: Secondary | ICD-10-CM

## 2022-03-19 DIAGNOSIS — D0462 Carcinoma in situ of skin of left upper limb, including shoulder: Secondary | ICD-10-CM

## 2022-03-19 DIAGNOSIS — C44529 Squamous cell carcinoma of skin of other part of trunk: Secondary | ICD-10-CM

## 2022-03-19 DIAGNOSIS — D099 Carcinoma in situ, unspecified: Secondary | ICD-10-CM

## 2022-03-19 HISTORY — DX: Carcinoma in situ, unspecified: D09.9

## 2022-03-19 NOTE — Progress Notes (Signed)
Follow-Up Visit   Subjective  Mark [name] is a 88 y.o. male who presents for the following: Other (Scaly spots of scalp and arms - The patient presents for Upper Body Skin Exam (UBSE) for skin cancer screening and mole check.  The patient has spots, moles and lesions to be evaluated, some may be new or changing and the patient has concerns that these could be cancer./).  The following portions of the chart were reviewed this encounter and updated as appropriate:   Tobacco  Allergies  Meds  Problems  Med Hx  Surg Hx  Fam Hx     Review of Systems:  No other skin or systemic complaints except as noted in HPI or Assessment and Plan.  Objective  Well appearing patient in no apparent distress; mood and affect are within normal limits.  All skin waist up examined.  Face, scalp, ears (17) Erythematous thin papules/macules with gritty scale.   Left dorsum forearm near elbow Hyperkeratotic papule 1.5 cm  Left sup pectoral Hyperkeratotic papule 1.1 cm  Neck (4) Erythematous stuck-on, waxy papule or plaque   Assessment & Plan   Actinic Damage - chronic, secondary to cumulative UV radiation exposure/sun exposure over time - diffuse scaly erythematous macules with underlying dyspigmentation - Recommend daily broad spectrum sunscreen SPF 30+ to sun-exposed areas, reapply every 2 hours as needed.  - Recommend staying in the shade or wearing long sleeves, sun glasses (UVA+UVB protection) and wide brim hats (4-inch brim around the entire circumference of the hat). - Call for new or changing lesions.  Seborrheic Keratoses - Stuck-on, waxy, tan-brown papules and/or plaques  - Benign-appearing - Discussed benign etiology and prognosis. - Observe - Call for any changes  AK (actinic keratosis) (17) Face, scalp, ears Destruction of lesion - Face, scalp, ears Complexity: simple   Destruction method: cryotherapy   Informed consent: discussed and consent obtained   Timeout:   patient name, date of birth, surgical site, and procedure verified Lesion destroyed using liquid nitrogen: Yes   Region frozen until ice ball extended beyond lesion: Yes   Outcome: patient tolerated procedure well with no complications   Post-procedure details: wound care instructions given    Neoplasm of uncertain behavior of skin (2) Left dorsum forearm near elbow -1.5 cm Epidermal / dermal shaving  Informed consent: discussed and consent obtained   Timeout: patient name, date of birth, surgical site, and procedure verified   Procedure prep:  Patient was prepped and draped in usual sterile fashion Prep type:  Isopropyl alcohol Anesthesia: the lesion was anesthetized in a standard fashion   Anesthetic:  1% lidocaine w/ epinephrine 1-100,000 buffered w/ 8.4% NaHCO3 Instrument used: flexible razor blade   Hemostasis achieved with: pressure, aluminum chloride and electrodesiccation   Outcome: patient tolerated procedure well   Post-procedure details: sterile dressing applied and wound care instructions given   Dressing type: bandage and petrolatum    Destruction of lesion -2.1 cm Complexity: extensive   Destruction method: electrodesiccation and curettage   Informed consent: discussed and consent obtained   Timeout:  patient name, date of birth, surgical site, and procedure verified Procedure prep:  Patient was prepped and draped in usual sterile fashion Prep type:  Isopropyl alcohol Anesthesia: the lesion was anesthetized in a standard fashion   Anesthetic:  1% lidocaine w/ epinephrine 1-100,000 buffered w/ 8.4% NaHCO3 Curettage performed in three different directions: Yes   Electrodesiccation performed over the curetted area: Yes   Hemostasis achieved with:  pressure and  aluminum chloride Outcome: patient tolerated procedure well with no complications   Post-procedure details: sterile dressing applied and wound care instructions given   Dressing type: bandage and petrolatum     Specimen 1 - Surgical pathology Differential Diagnosis: SCC vs other Check Margins: No EDC today  Left sup pectoral 1.1 centimeters Epidermal / dermal shaving  Informed consent: discussed and consent obtained   Timeout: patient name, date of birth, surgical site, and procedure verified   Procedure prep:  Patient was prepped and draped in usual sterile fashion Prep type:  Isopropyl alcohol Anesthesia: the lesion was anesthetized in a standard fashion   Anesthetic:  1% lidocaine w/ epinephrine 1-100,000 buffered w/ 8.4% NaHCO3 Instrument used: flexible razor blade   Hemostasis achieved with: pressure, aluminum chloride and electrodesiccation   Outcome: patient tolerated procedure well   Post-procedure details: sterile dressing applied and wound care instructions given   Dressing type: bandage and petrolatum    Destruction of lesion -1.5 cm Complexity: extensive   Destruction method: electrodesiccation and curettage   Informed consent: discussed and consent obtained   Timeout:  patient name, date of birth, surgical site, and procedure verified Procedure prep:  Patient was prepped and draped in usual sterile fashion Prep type:  Isopropyl alcohol Anesthesia: the lesion was anesthetized in a standard fashion   Anesthetic:  1% lidocaine w/ epinephrine 1-100,000 buffered w/ 8.4% NaHCO3 Curettage performed in three different directions: Yes   Electrodesiccation performed over the curetted area: Yes   Hemostasis achieved with:  pressure and aluminum chloride Outcome: patient tolerated procedure well with no complications   Post-procedure details: sterile dressing applied and wound care instructions given   Dressing type: bandage and petrolatum    Specimen 2 - Surgical pathology Differential Diagnosis: SCC vs other  Check Margins: No EDC today  Inflamed seborrheic keratosis (4) Neck Destruction of lesion - Neck Complexity: simple   Destruction method: cryotherapy   Informed  consent: discussed and consent obtained   Timeout:  patient name, date of birth, surgical site, and procedure verified Lesion destroyed using liquid nitrogen: Yes   Region frozen until ice ball extended beyond lesion: Yes   Outcome: patient tolerated procedure well with no complications   Post-procedure details: wound care instructions given    Return in about 6 months (around 09/19/2022).  I, Ashok Cordia, CMA, am acting as scribe for Sarina Ser, MD . Documentation: I have reviewed the above documentation for accuracy and completeness, and I agree with the above.  Sarina Ser, MD

## 2022-03-19 NOTE — Patient Instructions (Signed)
Cryotherapy Aftercare  Wash gently with soap and water everyday.   Apply Vaseline and Band-Aid daily until healed.  Wound Care Instructions  Cleanse wound gently with soap and water once a day then pat dry with clean gauze. Apply a thin coat of Petrolatum (petroleum jelly, "Vaseline") over the wound (unless you have an allergy to this). We recommend that you use a new, sterile tube of Vaseline. Do not pick or remove scabs. Do not remove the yellow or white "healing tissue" from the base of the wound.  Cover the wound with fresh, clean, nonstick gauze and secure with paper tape. You may use Band-Aids in place of gauze and tape if the wound is small enough, but would recommend trimming much of the tape off as there is often too much. Sometimes Band-Aids can irritate the skin.  You should call the office for your biopsy report after 1 week if you have not already been contacted.  If you experience any problems, such as abnormal amounts of bleeding, swelling, significant bruising, significant pain, or evidence of infection, please call the office immediately.  FOR ADULT SURGERY PATIENTS: If you need something for pain relief you may take 1 extra strength Tylenol (acetaminophen) AND 2 Ibuprofen (200mg each) together every 4 hours as needed for pain. (do not take these if you are allergic to them or if you have a reason you should not take them.) Typically, you may only need pain medication for 1 to 3 days.      Due to recent changes in healthcare laws, you may see results of your pathology and/or laboratory studies on MyChart before the doctors have had a chance to review them. We understand that in some cases there may be results that are confusing or concerning to you. Please understand that not all results are received at the same time and often the doctors may need to interpret multiple results in order to provide you with the best plan of care or course of treatment. Therefore, we ask that you  please give us 2 business days to thoroughly review all your results before contacting the office for clarification. Should we see a critical lab result, you will be contacted sooner.   If You Need Anything After Your Visit  If you have any questions or concerns for your doctor, please call our main line at [phone] and press option 4 to reach your doctor's medical assistant. If no one answers, please leave a voicemail as directed and we will return your call as soon as possible. Messages left after 4 pm will be answered the following business day.   You may also send us a message via MyChart. We typically respond to MyChart messages within 1-2 business days.  For prescription refills, please ask your pharmacy to contact our office. Our fax number is [phone].  If you have an urgent issue when the clinic is closed that cannot wait until the next business day, you can page your doctor at the number below.    Please note that while we do our best to be available for urgent issues outside of office hours, we are not available 24/7.   If you have an urgent issue and are unable to reach us, you may choose to seek medical care at your doctor's office, retail clinic, urgent care center, or emergency room.  If you have a medical emergency, please immediately call 911 or go to the emergency department.  Pager Numbers  - Dr. Kowalski: [phone]  -   Dr. Moye: [phone]  - Dr. Stewart: [phone]  In the event of inclement weather, please call our main line at [phone] for an update on the status of any delays or closures.  Dermatology Medication Tips: Please keep the boxes that topical medications come in in order to help keep track of the instructions about where and how to use these. Pharmacies typically print the medication instructions only on the boxes and not directly on the medication tubes.   If your medication is too expensive, please contact our office at  [phone] option 4 or send us a message through MyChart.   We are unable to tell what your co-pay for medications will be in advance as this is different depending on your insurance coverage. However, we may be able to find a substitute medication at lower cost or fill out paperwork to get insurance to cover a needed medication.   If a prior authorization is required to get your medication covered by your insurance company, please allow us 1-2 business days to complete this process.  Drug prices often vary depending on where the prescription is filled and some pharmacies may offer cheaper prices.  The website www.goodrx.com contains coupons for medications through different pharmacies. The prices here do not account for what the cost may be with help from insurance (it may be cheaper with your insurance), but the website can give you the price if you did not use any insurance.  - You can print the associated coupon and take it with your prescription to the pharmacy.  - You may also stop by our office during regular business hours and pick up a GoodRx coupon card.  - If you need your prescription sent electronically to a different pharmacy, notify our office through [facility] MyChart or by phone at [phone] option 4.     Si Usted Necesita Algo Despus de Su Visita  Tambin puede enviarnos un mensaje a travs de MyChart. Por lo general respondemos a los mensajes de MyChart en el transcurso de 1 a 2 das hbiles.  Para renovar recetas, por favor pida a su farmacia que se ponga en contacto con nuestra oficina. Nuestro nmero de fax es el [phone].  Si tiene un asunto urgente cuando la clnica est cerrada y que no puede esperar hasta el siguiente da hbil, puede llamar/localizar a su doctor(a) al nmero que aparece a continuacin.   Por favor, tenga en cuenta que aunque hacemos todo lo posible para estar disponibles para asuntos urgentes fuera del horario de oficina, no estamos  disponibles las 24 horas del da, los 7 das de la semana.   Si tiene un problema urgente y no puede comunicarse con nosotros, puede optar por buscar atencin mdica  en el consultorio de su doctor(a), en una clnica privada, en un centro de atencin urgente o en una sala de emergencias.  Si tiene una emergencia mdica, por favor llame inmediatamente al 911 o vaya a la sala de emergencias.  Nmeros de bper  - Dr. Kowalski: [phone]  - Dra. Moye: [phone]  - Dra. Stewart: [phone]  En caso de inclemencias del tiempo, por favor llame a nuestra lnea principal al [phone] para una actualizacin sobre el estado de cualquier retraso o cierre.  Consejos para la medicacin en dermatologa: Por favor, guarde las cajas en las que vienen los medicamentos de uso tpico para ayudarle a seguir las instrucciones sobre dnde y cmo usarlos. Las farmacias generalmente imprimen las instrucciones del medicamento slo en las cajas y   no directamente en los tubos del medicamento.   Si su medicamento es muy caro, por favor, pngase en contacto con nuestra oficina llamando al [phone] y presione la opcin 4 o envenos un mensaje a travs de MyChart.   No podemos decirle cul ser su copago por los medicamentos por adelantado ya que esto es diferente dependiendo de la cobertura de su seguro. Sin embargo, es posible que podamos encontrar un medicamento sustituto a menor costo o llenar un formulario para que el seguro cubra el medicamento que se considera necesario.   Si se requiere una autorizacin previa para que su compaa de seguros cubra su medicamento, por favor permtanos de 1 a 2 das hbiles para completar este proceso.  Los precios de los medicamentos varan con frecuencia dependiendo del lugar de dnde se surte la receta y alguna farmacias pueden ofrecer precios ms baratos.  El sitio web www.goodrx.com tiene cupones para medicamentos de diferentes farmacias. Los precios aqu no  tienen en cuenta lo que podra costar con la ayuda del seguro (puede ser ms barato con su seguro), pero el sitio web puede darle el precio si no utiliz ningn seguro.  - Puede imprimir el cupn correspondiente y llevarlo con su receta a la farmacia.  - Tambin puede pasar por nuestra oficina durante el horario de atencin regular y recoger una tarjeta de cupones de GoodRx.  - Si necesita que su receta se enve electrnicamente a una farmacia diferente, informe a nuestra oficina a travs de MyChart de [facility] o por telfono llamando al [phone] y presione la opcin 4.  

## 2022-03-22 DIAGNOSIS — J302 Other seasonal allergic rhinitis: Secondary | ICD-10-CM | POA: Diagnosis not present

## 2022-03-22 DIAGNOSIS — J45909 Unspecified asthma, uncomplicated: Secondary | ICD-10-CM | POA: Diagnosis not present

## 2022-03-22 DIAGNOSIS — I509 Heart failure, unspecified: Secondary | ICD-10-CM | POA: Diagnosis not present

## 2022-03-22 DIAGNOSIS — E261 Secondary hyperaldosteronism: Secondary | ICD-10-CM | POA: Diagnosis not present

## 2022-03-22 DIAGNOSIS — J449 Chronic obstructive pulmonary disease, unspecified: Secondary | ICD-10-CM | POA: Diagnosis not present

## 2022-03-22 DIAGNOSIS — M109 Gout, unspecified: Secondary | ICD-10-CM | POA: Diagnosis not present

## 2022-03-22 DIAGNOSIS — G4700 Insomnia, unspecified: Secondary | ICD-10-CM | POA: Diagnosis not present

## 2022-03-22 DIAGNOSIS — K219 Gastro-esophageal reflux disease without esophagitis: Secondary | ICD-10-CM | POA: Diagnosis not present

## 2022-03-22 DIAGNOSIS — I2510 Atherosclerotic heart disease of native coronary artery without angina pectoris: Secondary | ICD-10-CM | POA: Diagnosis not present

## 2022-03-22 DIAGNOSIS — J8410 Pulmonary fibrosis, unspecified: Secondary | ICD-10-CM | POA: Diagnosis not present

## 2022-03-22 DIAGNOSIS — I252 Old myocardial infarction: Secondary | ICD-10-CM | POA: Diagnosis not present

## 2022-03-22 DIAGNOSIS — E876 Hypokalemia: Secondary | ICD-10-CM | POA: Diagnosis not present

## 2022-03-22 DIAGNOSIS — E785 Hyperlipidemia, unspecified: Secondary | ICD-10-CM | POA: Diagnosis not present

## 2022-03-22 DIAGNOSIS — G629 Polyneuropathy, unspecified: Secondary | ICD-10-CM | POA: Diagnosis not present

## 2022-03-22 DIAGNOSIS — R5381 Other malaise: Secondary | ICD-10-CM | POA: Diagnosis not present

## 2022-03-23 ENCOUNTER — Encounter: Payer: Self-pay | Admitting: Dermatology

## 2022-03-23 ENCOUNTER — Telehealth: Payer: Self-pay | Admitting: Family Medicine

## 2022-03-23 DIAGNOSIS — M79671 Pain in right foot: Secondary | ICD-10-CM | POA: Diagnosis not present
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
## 2022-03-23 NOTE — Telephone Encounter (Signed)
Contacted NT to connect with NP

## 2022-03-23 NOTE — Telephone Encounter (Signed)
Called and spoke with Zigmund Daniel, NP. This was her first visit with pt so was unsure if this was new finding or not for pt. He uses rollator while walking but her concern is him being unable to feel floor. Called and left VM for pt's son to call back to discuss sx. Will send to provider to fu for more info if needed and see if pt needs to be seen.

## 2022-03-26 ENCOUNTER — Telehealth: Payer: Self-pay

## 2022-03-26 NOTE — Telephone Encounter (Signed)
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 Skin , left sup pectoral MODERATELY DIFFERENTIATED SQUAMOUS CELL CARCINOMA, ADENOID VARIANT   1& 2 - both Cancer - SCC Both already treated Recheck next visit

## 2022-03-26 NOTE — Telephone Encounter (Signed)
-----   Message from Ralene Bathe, MD sent at 03/22/2022  2:19 PM EDT ----- Diagnosis 1. Skin , left dorsum forearm near elbow SQUAMOUS CELL CARCINOMA IN SITU, HYPERTROPHIC 2. 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

## 2022-03-26 NOTE — Telephone Encounter (Signed)
I tried calling pt and no answer and no option for VM

## 2022-03-27 NOTE — Progress Notes (Unsigned)
Name: Mark [name]   MRN: [id number]    DOB: Jul 24, 1929   Date:03/28/2022       Progress Note  Subjective  Chief Complaint  Leg Numbness  HPI  Mr. Silvio came in with his son today. He lives at [facility] and states one week ago he developed acute pain and swelling of 5th right toe, the PA from the nursing home gave him 3 pills for gout and symptoms resolved. Today he is feeling fine, no pain , swelling or discomfort  Patient Active Problem List   Diagnosis Date Noted   Vitamin D deficiency 12/21/2021   Other vitamin B12 deficiency anemias 12/21/2021   Seasonal allergic rhinitis 12/21/2021   Hypothyroidism 12/21/2021   ILD (interstitial lung disease) ([place]) 12/15/2021   Mild protein-calorie malnutrition ([place]) 11/23/2021   Paroxysmal atrial fibrillation ([place]) 11/14/2021   Tachycardia 09/29/2021   HFrEF (heart failure with reduced ejection fraction) ([place]) 12/02/2020   Cardiac syncope 09/22/2019   Fibrosis of lung ([place]) 09/29/2018   Senile purpura ([place]) 09/29/2018   Atherosclerosis of aorta ([place]) 09/29/2018   Abnormal ECG 06/16/2018   Thoracic aortic aneurysm without rupture ([place]) 05/28/2018   CHF (congestive heart failure), NYHA class III, chronic, systolic ([place]) 93/81/0175   Chronic HFrEF (heart failure with reduced ejection fraction) ([place]) 05/28/2018   BPH with obstruction/lower urinary tract symptoms 02/26/2018   Ischemic cardiomyopathy 06/11/2017   Benign essential HTN 05/22/2017   Bilateral carotid artery stenosis 05/22/2017   Neuropathy 01/28/2017   Right leg paresthesias 11/26/2016   Hypertension 08/27/2016   GERD (gastroesophageal reflux disease) 08/27/2016   History of nonmelanoma skin cancer 10/10/2015   Pain in right shoulder 09/13/2015   Osteoarthritis 07/06/2015   Hyperlipidemia 03/29/2015   Family history of type 2 diabetes mellitus in father 03/29/2015   Diverticulosis of intestine without perforation or abscess without bleeding 02/03/2015   DDD  (degenerative disc disease), lumbar 03/03/2014   Intervertebral disc disorder with radiculopathy of lumbar region 03/03/2014   Lumbar radiculitis 03/03/2014   Lumbar stenosis with neurogenic claudication 03/03/2014   Arteriosclerosis of coronary artery 04/18/2012   S/P coronary artery stent placement 04/18/2012   Status post percutaneous transluminal coronary angioplasty 04/18/2012    Past Surgical History:  Procedure Laterality Date   CATARACT EXTRACTION W/PHACO Right 11/24/2019   Procedure: CATARACT EXTRACTION PHACO AND INTRAOCULAR LENS PLACEMENT (IOC) RIGHT MALYUGIN 8.59 00:50.8;  Surgeon: Birder Robson, MD;  Location: [facility];  Service: Ophthalmology;  Laterality: Right;  COVID ( + ) 09-01-19   CATARACT EXTRACTION W/PHACO Left 12/15/2019   Procedure: CATARACT EXTRACTION PHACO AND INTRAOCULAR LENS PLACEMENT (IOC) LEFT 9.90  01:03.7;  Surgeon: Birder Robson, MD;  Location: [facility];  Service: Ophthalmology;  Laterality: Left;   CORONARY ANGIOPLASTY WITH STENT PLACEMENT  2005   [facility]    Family History  Problem Relation Age of Onset   Hypertension Mother    Heart disease Mother    Heart disease Father    Hypertension Father     Social History   Tobacco Use   Smoking status: Former    Packs/day: 0.25    Years: 15.00    Total pack years: 3.75    Types: Cigarettes    Quit date: 05/16/1963    Years since quitting: 58.9   Smokeless tobacco: Never   Tobacco comments:    quit over 50 years ago- 08/08/2021  Substance Use Topics   Alcohol use: No     Current Outpatient Medications:    acetaminophen (  TYLENOL) 500 MG tablet, Take 500 mg by mouth every 6 (six) hours as needed., Disp: , Rfl:    atorvastatin (LIPITOR) 10 MG tablet, Take 1 tablet (10 mg total) by mouth daily., Disp: 90 tablet, Rfl: 1   bumetanide (BUMEX) 2 MG tablet, Take 1 tablet by mouth in the morning and at bedtime., Disp: , Rfl:    diclofenac Sodium (VOLTAREN) 1 % GEL, Apply topically.,  Disp: , Rfl:    digoxin (LANOXIN) 0.125 MG tablet, Take 125 mcg by mouth daily., Disp: , Rfl:    ELIQUIS 5 MG TABS tablet, Take 5 mg by mouth 2 (two) times daily., Disp: , Rfl:    FARXIGA 10 MG TABS tablet, Take 10 mg by mouth daily., Disp: , Rfl:    furosemide (LASIX) 40 MG tablet, Take 40 mg by mouth 2 (two) times daily., Disp: , Rfl:    melatonin 3 MG TABS tablet, Take 2 tablets by mouth every evening., Disp: , Rfl:    montelukast (SINGULAIR) 10 MG tablet, Take 1 tablet (10 mg total) by mouth at bedtime., Disp: 90 tablet, Rfl: 1   pantoprazole (PROTONIX) 40 MG tablet, Take 1 tablet by mouth daily., Disp: , Rfl:    Potassium Chloride ER 20 MEQ TBCR, Take 1 tablet by mouth 2 (two) times daily., Disp: , Rfl:    Probiotic Product (PROBIOTIC PO), Take by mouth daily., Disp: , Rfl:    tamsulosin (FLOMAX) 0.4 MG CAPS capsule, TAKE 1 CAPSULE BY MOUTH  DAILY, Disp: 90 capsule, Rfl: 3   terbinafine (LAMISIL AT) 1 % cream, Apply 1 Application topically every evening. In the morning use topical antifungal powder between toes, Disp: 42 g, Rfl: 0   traZODone (DESYREL) 150 MG tablet, Take 1 tablet by mouth at bedtime., Disp: , Rfl:    fluocinolone (SYNALAR) 0.01 % external solution, Apply topically. (Patient not taking: Reported on 03/28/2022), Disp: , Rfl:    hydrocortisone cream 1 %, Apply topically. (Patient not taking: Reported on 03/28/2022), Disp: , Rfl:    hydrOXYzine (ATARAX) 25 MG tablet, Take 25 mg by mouth 3 (three) times daily as needed. (Patient not taking: Reported on 03/28/2022), Disp: , Rfl:    ipratropium-albuterol (DUONEB) 0.5-2.5 (3) MG/3ML SOLN, Take 3 mLs by nebulization 3 (three) times daily as needed (sob cough wheeze - use in place of albuterol nebs). (Patient not taking: Reported on 03/28/2022), Disp: 30 mL, Rfl: 0   levalbuterol (XOPENEX) 1.25 MG/3ML nebulizer solution, Take 1.25 mg by nebulization 3 (three) times daily as needed for wheezing. (Patient not taking: Reported on 03/28/2022),  Disp: 90 mL, Rfl: 3   mometasone (ELOCON) 0.1 % cream, Apply 1 application. topically daily. 5 times per week to rash and itch of neck (Patient not taking: Reported on 03/28/2022), Disp: 45 g, Rfl: 0  Allergies  Allergen Reactions   Gramineae Pollens Itching and Shortness Of Breath    Sneezing   Propranolol Other (See Comments)    syncope   Cat Hair Extract Itching    Watery eyes and sneezing   Breo Ellipta [Fluticasone Furoate-Vilanterol] Rash    Patient stated that it made his mouth break out   Rosuvastatin Rash and Other (See Comments)    No reaction per pt, He saw some information regarding Crestor and adverse effects     I personally reviewed active problem list, medication list, allergies, family history, social history, health maintenance with the patient/caregiver today.   ROS  Ten systems reviewed and is negative except as  mentioned in HPI  He has a chronic cough, SOB intermittently but is stable   Objective  Vitals:   03/28/22 1454  BP: 108/64  Pulse: 82  Resp: 16  SpO2: 93%  Weight: 172 lb 11.2 oz (78.3 kg)  Height: '5\' 8"'$  (1.727 m)    Body mass index is 26.26 kg/m.  Physical Exam  Constitutional: Patient appears well-developed and well-nourished.  No distress.  HEENT: head atraumatic, normocephalic, pupils equal and reactive to light, neck supple Cardiovascular: Normal rate, regular rhythm and normal heart sounds.  No murmur heard. Trace  BLE edema. Pulmonary/Chest: Effort normal and breath sounds normal. No respiratory distress. Abdominal: Soft.  There is no tenderness. Psychiatric: Patient has a normal mood and affect. behavior is normal. Judgment and thought content normal.  Skin: maceration of both toe webs between 4 th and 5 th toes Muscular skeletal: normal exam 5th toe , he has hammer toes both feet   PHQ2/9:    03/28/2022    2:54 PM 12/21/2021   11:14 AM 12/21/2021   10:21 AM 11/23/2021   11:21 AM 11/13/2021    9:52 AM  Depression screen PHQ  2/9  Decreased Interest 0 0 0 0 0  Down, Depressed, Hopeless 0 0 0 0 0  PHQ - 2 Score 0 0 0 0 0  Altered sleeping 0 0 0 0 0  Tired, decreased energy 0 0 0 0 0  Change in appetite 0 0 0 0 0  Feeling bad or failure about yourself  0 0 0 0 0  Trouble concentrating 0 0 0 0 0  Moving slowly or fidgety/restless 0 0 0 0 0  Suicidal thoughts 0 0 0 0 0  PHQ-9 Score 0 0 0 0 0  Difficult doing work/chores Not difficult at all Not difficult at all   Not difficult at all    phq 9 is negative   Fall Risk:    03/28/2022    2:54 PM 12/21/2021   11:15 AM 12/21/2021   10:21 AM 11/23/2021   11:23 AM 11/13/2021    9:51 AM  Fall Risk   Falls in the past year? 0 0 0 1 0  Number falls in past yr: 0 0 0 0 0  Injury with Fall? 0 0 0 0 0  Risk for fall due to : No Fall Risks No Fall Risks Impaired balance/gait Impaired balance/gait No Fall Risks  Follow up Falls prevention discussed;Education provided Falls prevention discussed Falls prevention discussed Falls prevention discussed;Falls evaluation completed;Education provided Falls prevention discussed;Education provided     Assessment & Plan  1. Tinea pedis of both feet  - terbinafine (LAMISIL AT) 1 % cream; Apply 1 Application topically every evening. In the morning use topical antifungal powder between toes  Dispense: 42 g; Refill: 0  2. Acute foot pain, right  It may have been gout

## 2022-03-28 ENCOUNTER — Ambulatory Visit (INDEPENDENT_AMBULATORY_CARE_PROVIDER_SITE_OTHER): Payer: Medicare HMO | Admitting: Family Medicine

## 2022-03-28 ENCOUNTER — Encounter: Payer: Self-pay | Admitting: Family Medicine

## 2022-03-28 ENCOUNTER — Telehealth: Payer: Self-pay | Admitting: Family Medicine

## 2022-03-28 VITALS — BP 108/64 | HR 82 | Resp 16 | Ht 68.0 in | Wt 172.7 lb

## 2022-03-28 DIAGNOSIS — M79671 Pain in right foot: Secondary | ICD-10-CM | POA: Diagnosis not present

## 2022-03-28 DIAGNOSIS — B353 Tinea pedis: Secondary | ICD-10-CM | POA: Diagnosis not present

## 2022-03-28 MED ORDER — TERBINAFINE HCL 1 % EX CREA: 1.0000 | TOPICAL_CREAM | Freq: Every evening | CUTANEOUS | 0 refills | Status: DC

## 2022-03-28 NOTE — Telephone Encounter (Signed)
Copied from [place] [id number]. Topic: Medical Record Request - Other >> Mar 28, 2022  3:55 PM Everette C wrote: Reason for CRM: The patient's son has called to request a copy of their prescription for terbinafine (LAMISIL AT) 1 % cream [[id number]] and the included directions be submitted to [facility] via fax at [phone] to the attention of Vaughan Basta  Please contact the patient's son further if needed

## 2022-03-29 ENCOUNTER — Telehealth: Payer: Self-pay

## 2022-03-29 ENCOUNTER — Other Ambulatory Visit: Payer: Self-pay

## 2022-03-29 DIAGNOSIS — B353 Tinea pedis: Secondary | ICD-10-CM

## 2022-03-29 MED ORDER — TERBINAFINE HCL 1 % EX CREA: 1.0000 | TOPICAL_CREAM | Freq: Every evening | CUTANEOUS | 0 refills | Status: AC

## 2022-03-29 NOTE — Telephone Encounter (Signed)
Change in pharmacy

## 2022-03-29 NOTE — Telephone Encounter (Signed)
Copied from [phone]. Topic: Medical Record Request - Other >> Mar 28, 2022  3:55 PM Everette C wrote: Reason for CRM: The patient's son has called to request a copy of their prescription for terbinafine (LAMISIL AT) 1 % cream [[id number]] and the included directions be submitted to [facility] via fax at [phone] to the attention of Vaughan Basta  Please contact the patient's son further if needed

## 2022-04-03 DIAGNOSIS — J45909 Unspecified asthma, uncomplicated: Secondary | ICD-10-CM | POA: Diagnosis not present

## 2022-04-03 DIAGNOSIS — N400 Enlarged prostate without lower urinary tract symptoms: Secondary | ICD-10-CM | POA: Diagnosis not present

## 2022-04-03 DIAGNOSIS — M1990 Unspecified osteoarthritis, unspecified site: Secondary | ICD-10-CM | POA: Diagnosis not present

## 2022-04-03 DIAGNOSIS — I509 Heart failure, unspecified: Secondary | ICD-10-CM | POA: Diagnosis not present

## 2022-04-03 DIAGNOSIS — E119 Type 2 diabetes mellitus without complications: Secondary | ICD-10-CM | POA: Diagnosis not present

## 2022-04-03 DIAGNOSIS — D518 Other vitamin B12 deficiency anemias: Secondary | ICD-10-CM | POA: Diagnosis not present

## 2022-04-03 DIAGNOSIS — I480 Paroxysmal atrial fibrillation: Secondary | ICD-10-CM | POA: Diagnosis not present

## 2022-04-03 DIAGNOSIS — E038 Other specified hypothyroidism: Secondary | ICD-10-CM | POA: Diagnosis not present

## 2022-04-03 DIAGNOSIS — J449 Chronic obstructive pulmonary disease, unspecified: Secondary | ICD-10-CM | POA: Diagnosis not present

## 2022-04-03 DIAGNOSIS — E559 Vitamin D deficiency, unspecified: Secondary | ICD-10-CM | POA: Diagnosis not present

## 2022-04-03 DIAGNOSIS — I2510 Atherosclerotic heart disease of native coronary artery without angina pectoris: Secondary | ICD-10-CM | POA: Diagnosis not present

## 2022-04-03 DIAGNOSIS — E785 Hyperlipidemia, unspecified: Secondary | ICD-10-CM | POA: Diagnosis not present

## 2022-04-04 ENCOUNTER — Other Ambulatory Visit: Payer: Self-pay

## 2022-04-04 NOTE — Progress Notes (Signed)
Fax received from [facility] stating that patient is no longer applying Mometasone topically as if is ineffective for the patient.

## 2022-04-11 ENCOUNTER — Telehealth: Payer: Self-pay

## 2022-04-11 NOTE — Telephone Encounter (Signed)
Copied from [place] [id number]. Topic: Medical Record Request - Provider/Facility Request >> Apr 11, 2022  1:07 PM Erskine Squibb wrote: Reason for CRM: Vaughan Basta with [facility] has called requesting a copy of the patients labs from April 27 regarding Quantiferon TB Gold plus. Please fax to [phone]. Please assist further    Lab printed and faxed

## 2022-04-23 DIAGNOSIS — I10 Essential (primary) hypertension: Secondary | ICD-10-CM | POA: Diagnosis not present

## 2022-04-26 ENCOUNTER — Encounter (INDEPENDENT_AMBULATORY_CARE_PROVIDER_SITE_OTHER): Payer: Self-pay

## 2022-04-26 ENCOUNTER — Ambulatory Visit (INDEPENDENT_AMBULATORY_CARE_PROVIDER_SITE_OTHER): Payer: Self-pay | Admitting: Vascular Surgery

## 2022-04-29 DIAGNOSIS — E119 Type 2 diabetes mellitus without complications: Secondary | ICD-10-CM | POA: Diagnosis not present

## 2022-04-29 DIAGNOSIS — E038 Other specified hypothyroidism: Secondary | ICD-10-CM | POA: Diagnosis not present

## 2022-04-29 DIAGNOSIS — D518 Other vitamin B12 deficiency anemias: Secondary | ICD-10-CM | POA: Diagnosis not present

## 2022-04-29 DIAGNOSIS — I2510 Atherosclerotic heart disease of native coronary artery without angina pectoris: Secondary | ICD-10-CM | POA: Diagnosis not present

## 2022-04-29 DIAGNOSIS — N400 Enlarged prostate without lower urinary tract symptoms: Secondary | ICD-10-CM | POA: Diagnosis not present

## 2022-04-29 DIAGNOSIS — I509 Heart failure, unspecified: Secondary | ICD-10-CM | POA: Diagnosis not present

## 2022-04-29 DIAGNOSIS — E785 Hyperlipidemia, unspecified: Secondary | ICD-10-CM | POA: Diagnosis not present

## 2022-04-29 DIAGNOSIS — J449 Chronic obstructive pulmonary disease, unspecified: Secondary | ICD-10-CM | POA: Diagnosis not present

## 2022-04-29 DIAGNOSIS — M1990 Unspecified osteoarthritis, unspecified site: Secondary | ICD-10-CM | POA: Diagnosis not present

## 2022-04-29 DIAGNOSIS — E559 Vitamin D deficiency, unspecified: Secondary | ICD-10-CM | POA: Diagnosis not present

## 2022-04-29 DIAGNOSIS — I480 Paroxysmal atrial fibrillation: Secondary | ICD-10-CM | POA: Diagnosis not present

## 2022-04-29 DIAGNOSIS — J45909 Unspecified asthma, uncomplicated: Secondary | ICD-10-CM | POA: Diagnosis not present

## 2022-05-03 DIAGNOSIS — K219 Gastro-esophageal reflux disease without esophagitis: Secondary | ICD-10-CM | POA: Diagnosis not present

## 2022-05-03 DIAGNOSIS — N401 Enlarged prostate with lower urinary tract symptoms: Secondary | ICD-10-CM | POA: Diagnosis not present

## 2022-05-03 DIAGNOSIS — Z85828 Personal history of other malignant neoplasm of skin: Secondary | ICD-10-CM | POA: Diagnosis not present

## 2022-05-03 DIAGNOSIS — N138 Other obstructive and reflux uropathy: Secondary | ICD-10-CM | POA: Diagnosis not present

## 2022-05-03 DIAGNOSIS — J849 Interstitial pulmonary disease, unspecified: Secondary | ICD-10-CM | POA: Diagnosis not present

## 2022-05-03 DIAGNOSIS — I5022 Chronic systolic (congestive) heart failure: Secondary | ICD-10-CM | POA: Diagnosis not present

## 2022-05-03 DIAGNOSIS — E039 Hypothyroidism, unspecified: Secondary | ICD-10-CM | POA: Diagnosis not present

## 2022-05-03 DIAGNOSIS — I480 Paroxysmal atrial fibrillation: Secondary | ICD-10-CM | POA: Diagnosis not present

## 2022-05-07 DIAGNOSIS — U071 COVID-19: Secondary | ICD-10-CM | POA: Diagnosis not present

## 2022-05-10 ENCOUNTER — Ambulatory Visit (INDEPENDENT_AMBULATORY_CARE_PROVIDER_SITE_OTHER): Payer: Self-pay | Admitting: Vascular Surgery

## 2022-05-10 ENCOUNTER — Encounter (INDEPENDENT_AMBULATORY_CARE_PROVIDER_SITE_OTHER): Payer: Self-pay

## 2022-05-10 DIAGNOSIS — I5022 Chronic systolic (congestive) heart failure: Secondary | ICD-10-CM | POA: Diagnosis not present

## 2022-05-10 DIAGNOSIS — I493 Ventricular premature depolarization: Secondary | ICD-10-CM | POA: Diagnosis not present

## 2022-05-10 DIAGNOSIS — I480 Paroxysmal atrial fibrillation: Secondary | ICD-10-CM | POA: Diagnosis not present

## 2022-05-12 DIAGNOSIS — G4700 Insomnia, unspecified: Secondary | ICD-10-CM | POA: Diagnosis not present

## 2022-05-15 ENCOUNTER — Telehealth: Payer: Self-pay | Admitting: Family Medicine

## 2022-05-15 NOTE — Telephone Encounter (Signed)
Written order for discontinuation of Mometason 0.1% cream faxed.

## 2022-05-15 NOTE — Telephone Encounter (Addendum)
Mark [name] from [place] [facility] stated pt had an appointment a few weeks ago with the provider and was advised to discontinue medication Mometasone.  Mark [name] stated pt is at an assisted living and an order is needed.     Fax- [phone] Callback- [phone]

## 2022-05-28 ENCOUNTER — Encounter (INDEPENDENT_AMBULATORY_CARE_PROVIDER_SITE_OTHER): Payer: Self-pay

## 2022-05-28 DIAGNOSIS — R059 Cough, unspecified: Secondary | ICD-10-CM | POA: Diagnosis not present

## 2022-05-29 DIAGNOSIS — Z20822 Contact with and (suspected) exposure to covid-19: Secondary | ICD-10-CM | POA: Diagnosis not present

## 2022-05-29 DIAGNOSIS — Z792 Long term (current) use of antibiotics: Secondary | ICD-10-CM | POA: Diagnosis not present

## 2022-05-29 DIAGNOSIS — Z79899 Other long term (current) drug therapy: Secondary | ICD-10-CM | POA: Diagnosis not present

## 2022-05-29 DIAGNOSIS — Z593 Problems related to living in residential institution: Secondary | ICD-10-CM | POA: Diagnosis not present

## 2022-05-29 DIAGNOSIS — R0600 Dyspnea, unspecified: Secondary | ICD-10-CM | POA: Diagnosis not present

## 2022-05-29 DIAGNOSIS — Z8616 Personal history of COVID-19: Secondary | ICD-10-CM | POA: Diagnosis not present

## 2022-05-29 DIAGNOSIS — J8410 Pulmonary fibrosis, unspecified: Secondary | ICD-10-CM | POA: Diagnosis not present

## 2022-05-29 DIAGNOSIS — I440 Atrioventricular block, first degree: Secondary | ICD-10-CM | POA: Diagnosis not present

## 2022-05-29 DIAGNOSIS — R600 Localized edema: Secondary | ICD-10-CM | POA: Diagnosis not present

## 2022-05-29 DIAGNOSIS — Z87891 Personal history of nicotine dependence: Secondary | ICD-10-CM | POA: Diagnosis not present

## 2022-05-29 DIAGNOSIS — J189 Pneumonia, unspecified organism: Secondary | ICD-10-CM | POA: Diagnosis not present

## 2022-05-29 DIAGNOSIS — R051 Acute cough: Secondary | ICD-10-CM | POA: Diagnosis not present

## 2022-05-29 DIAGNOSIS — I491 Atrial premature depolarization: Secondary | ICD-10-CM | POA: Diagnosis not present

## 2022-06-01 DIAGNOSIS — G4700 Insomnia, unspecified: Secondary | ICD-10-CM | POA: Diagnosis not present

## 2022-06-01 DIAGNOSIS — R059 Cough, unspecified: Secondary | ICD-10-CM | POA: Diagnosis not present

## 2022-06-03 DIAGNOSIS — Z760 Encounter for issue of repeat prescription: Secondary | ICD-10-CM | POA: Diagnosis not present

## 2022-06-03 DIAGNOSIS — G4700 Insomnia, unspecified: Secondary | ICD-10-CM | POA: Diagnosis not present

## 2022-06-04 DIAGNOSIS — N401 Enlarged prostate with lower urinary tract symptoms: Secondary | ICD-10-CM | POA: Diagnosis not present

## 2022-06-04 DIAGNOSIS — R053 Chronic cough: Secondary | ICD-10-CM | POA: Diagnosis not present

## 2022-06-04 DIAGNOSIS — Z6822 Body mass index (BMI) 22.0-22.9, adult: Secondary | ICD-10-CM | POA: Diagnosis not present

## 2022-06-04 DIAGNOSIS — J8417 Interstitial lung disease with progressive fibrotic phenotype in diseases classified elsewhere: Secondary | ICD-10-CM | POA: Diagnosis not present

## 2022-06-04 DIAGNOSIS — Z8616 Personal history of COVID-19: Secondary | ICD-10-CM | POA: Diagnosis not present

## 2022-06-04 DIAGNOSIS — Z86718 Personal history of other venous thrombosis and embolism: Secondary | ICD-10-CM | POA: Diagnosis not present

## 2022-06-04 DIAGNOSIS — N138 Other obstructive and reflux uropathy: Secondary | ICD-10-CM | POA: Diagnosis not present

## 2022-06-04 DIAGNOSIS — F419 Anxiety disorder, unspecified: Secondary | ICD-10-CM | POA: Diagnosis not present

## 2022-06-04 DIAGNOSIS — Z87891 Personal history of nicotine dependence: Secondary | ICD-10-CM | POA: Diagnosis not present

## 2022-06-04 DIAGNOSIS — R0600 Dyspnea, unspecified: Secondary | ICD-10-CM | POA: Diagnosis not present

## 2022-06-04 DIAGNOSIS — J811 Chronic pulmonary edema: Secondary | ICD-10-CM | POA: Diagnosis not present

## 2022-06-04 DIAGNOSIS — I083 Combined rheumatic disorders of mitral, aortic and tricuspid valves: Secondary | ICD-10-CM | POA: Diagnosis not present

## 2022-06-04 DIAGNOSIS — I255 Ischemic cardiomyopathy: Secondary | ICD-10-CM | POA: Diagnosis not present

## 2022-06-04 DIAGNOSIS — R570 Cardiogenic shock: Secondary | ICD-10-CM | POA: Diagnosis not present

## 2022-06-04 DIAGNOSIS — Z79899 Other long term (current) drug therapy: Secondary | ICD-10-CM | POA: Diagnosis not present

## 2022-06-04 DIAGNOSIS — I2510 Atherosclerotic heart disease of native coronary artery without angina pectoris: Secondary | ICD-10-CM | POA: Diagnosis not present

## 2022-06-04 DIAGNOSIS — Z515 Encounter for palliative care: Secondary | ICD-10-CM | POA: Diagnosis not present

## 2022-06-04 DIAGNOSIS — R7401 Elevation of levels of liver transaminase levels: Secondary | ICD-10-CM | POA: Diagnosis not present

## 2022-06-04 DIAGNOSIS — J8410 Pulmonary fibrosis, unspecified: Secondary | ICD-10-CM | POA: Diagnosis not present

## 2022-06-04 DIAGNOSIS — I480 Paroxysmal atrial fibrillation: Secondary | ICD-10-CM | POA: Diagnosis not present

## 2022-06-04 DIAGNOSIS — I959 Hypotension, unspecified: Secondary | ICD-10-CM | POA: Diagnosis not present

## 2022-06-04 DIAGNOSIS — R0602 Shortness of breath: Secondary | ICD-10-CM | POA: Diagnosis not present

## 2022-06-04 DIAGNOSIS — I4891 Unspecified atrial fibrillation: Secondary | ICD-10-CM | POA: Diagnosis not present

## 2022-06-04 DIAGNOSIS — E785 Hyperlipidemia, unspecified: Secondary | ICD-10-CM | POA: Diagnosis not present

## 2022-06-04 DIAGNOSIS — K219 Gastro-esophageal reflux disease without esophagitis: Secondary | ICD-10-CM | POA: Diagnosis not present

## 2022-06-04 DIAGNOSIS — R000 Tachycardia, unspecified: Secondary | ICD-10-CM | POA: Diagnosis not present

## 2022-06-04 DIAGNOSIS — Z7901 Long term (current) use of anticoagulants: Secondary | ICD-10-CM | POA: Diagnosis not present

## 2022-06-04 DIAGNOSIS — I5023 Acute on chronic systolic (congestive) heart failure: Secondary | ICD-10-CM | POA: Diagnosis not present

## 2022-06-04 DIAGNOSIS — I110 Hypertensive heart disease with heart failure: Secondary | ICD-10-CM | POA: Diagnosis not present

## 2022-06-04 DIAGNOSIS — I499 Cardiac arrhythmia, unspecified: Secondary | ICD-10-CM | POA: Diagnosis not present

## 2022-06-04 DIAGNOSIS — J45901 Unspecified asthma with (acute) exacerbation: Secondary | ICD-10-CM | POA: Diagnosis not present

## 2022-06-04 DIAGNOSIS — R531 Weakness: Secondary | ICD-10-CM | POA: Diagnosis not present

## 2022-06-04 DIAGNOSIS — E46 Unspecified protein-calorie malnutrition: Secondary | ICD-10-CM | POA: Diagnosis not present

## 2022-06-04 DIAGNOSIS — J90 Pleural effusion, not elsewhere classified: Secondary | ICD-10-CM | POA: Diagnosis not present

## 2022-06-04 DIAGNOSIS — Z66 Do not resuscitate: Secondary | ICD-10-CM | POA: Diagnosis not present

## 2022-06-04 DIAGNOSIS — Z888 Allergy status to other drugs, medicaments and biological substances status: Secondary | ICD-10-CM | POA: Diagnosis not present

## 2022-06-05 DIAGNOSIS — I4891 Unspecified atrial fibrillation: Secondary | ICD-10-CM | POA: Diagnosis not present

## 2022-06-05 DIAGNOSIS — I959 Hypotension, unspecified: Secondary | ICD-10-CM | POA: Diagnosis not present

## 2022-06-05 DIAGNOSIS — I5023 Acute on chronic systolic (congestive) heart failure: Secondary | ICD-10-CM | POA: Diagnosis not present

## 2022-06-05 DIAGNOSIS — R7401 Elevation of levels of liver transaminase levels: Secondary | ICD-10-CM | POA: Diagnosis not present

## 2022-06-05 DIAGNOSIS — I083 Combined rheumatic disorders of mitral, aortic and tricuspid valves: Secondary | ICD-10-CM | POA: Diagnosis not present

## 2022-06-05 DIAGNOSIS — R053 Chronic cough: Secondary | ICD-10-CM | POA: Diagnosis not present

## 2022-06-06 DIAGNOSIS — R7402 Elevation of levels of lactic acid dehydrogenase (LDH): Secondary | ICD-10-CM | POA: Diagnosis not present

## 2022-06-06 DIAGNOSIS — R053 Chronic cough: Secondary | ICD-10-CM | POA: Diagnosis not present

## 2022-06-06 DIAGNOSIS — I959 Hypotension, unspecified: Secondary | ICD-10-CM | POA: Diagnosis not present

## 2022-06-06 DIAGNOSIS — I4891 Unspecified atrial fibrillation: Secondary | ICD-10-CM | POA: Diagnosis not present

## 2022-06-06 DIAGNOSIS — I5023 Acute on chronic systolic (congestive) heart failure: Secondary | ICD-10-CM | POA: Diagnosis not present

## 2022-06-06 DIAGNOSIS — R0602 Shortness of breath: Secondary | ICD-10-CM | POA: Diagnosis not present

## 2022-06-24 DIAGNOSIS — R9431 Abnormal electrocardiogram [ECG] [EKG]: Secondary | ICD-10-CM | POA: Diagnosis not present

## 2022-06-24 DIAGNOSIS — I4891 Unspecified atrial fibrillation: Secondary | ICD-10-CM | POA: Diagnosis not present

## 2022-06-26 ENCOUNTER — Ambulatory Visit: Payer: Medicare HMO | Admitting: Family Medicine

## 2022-06-29 DEATH — deceased

## 2022-09-17 ENCOUNTER — Ambulatory Visit: Payer: Medicare HMO | Admitting: Dermatology

## 2023-04-21 IMAGING — CR DG CHEST 2V
2 series · 2 of 2 positions shown · non-contrast
Comparison: Chest x-ray dated December 01, 2020.

CLINICAL DATA: Shortness of breath.

EXAM:
CHEST - 2 VIEW

[chest pa]
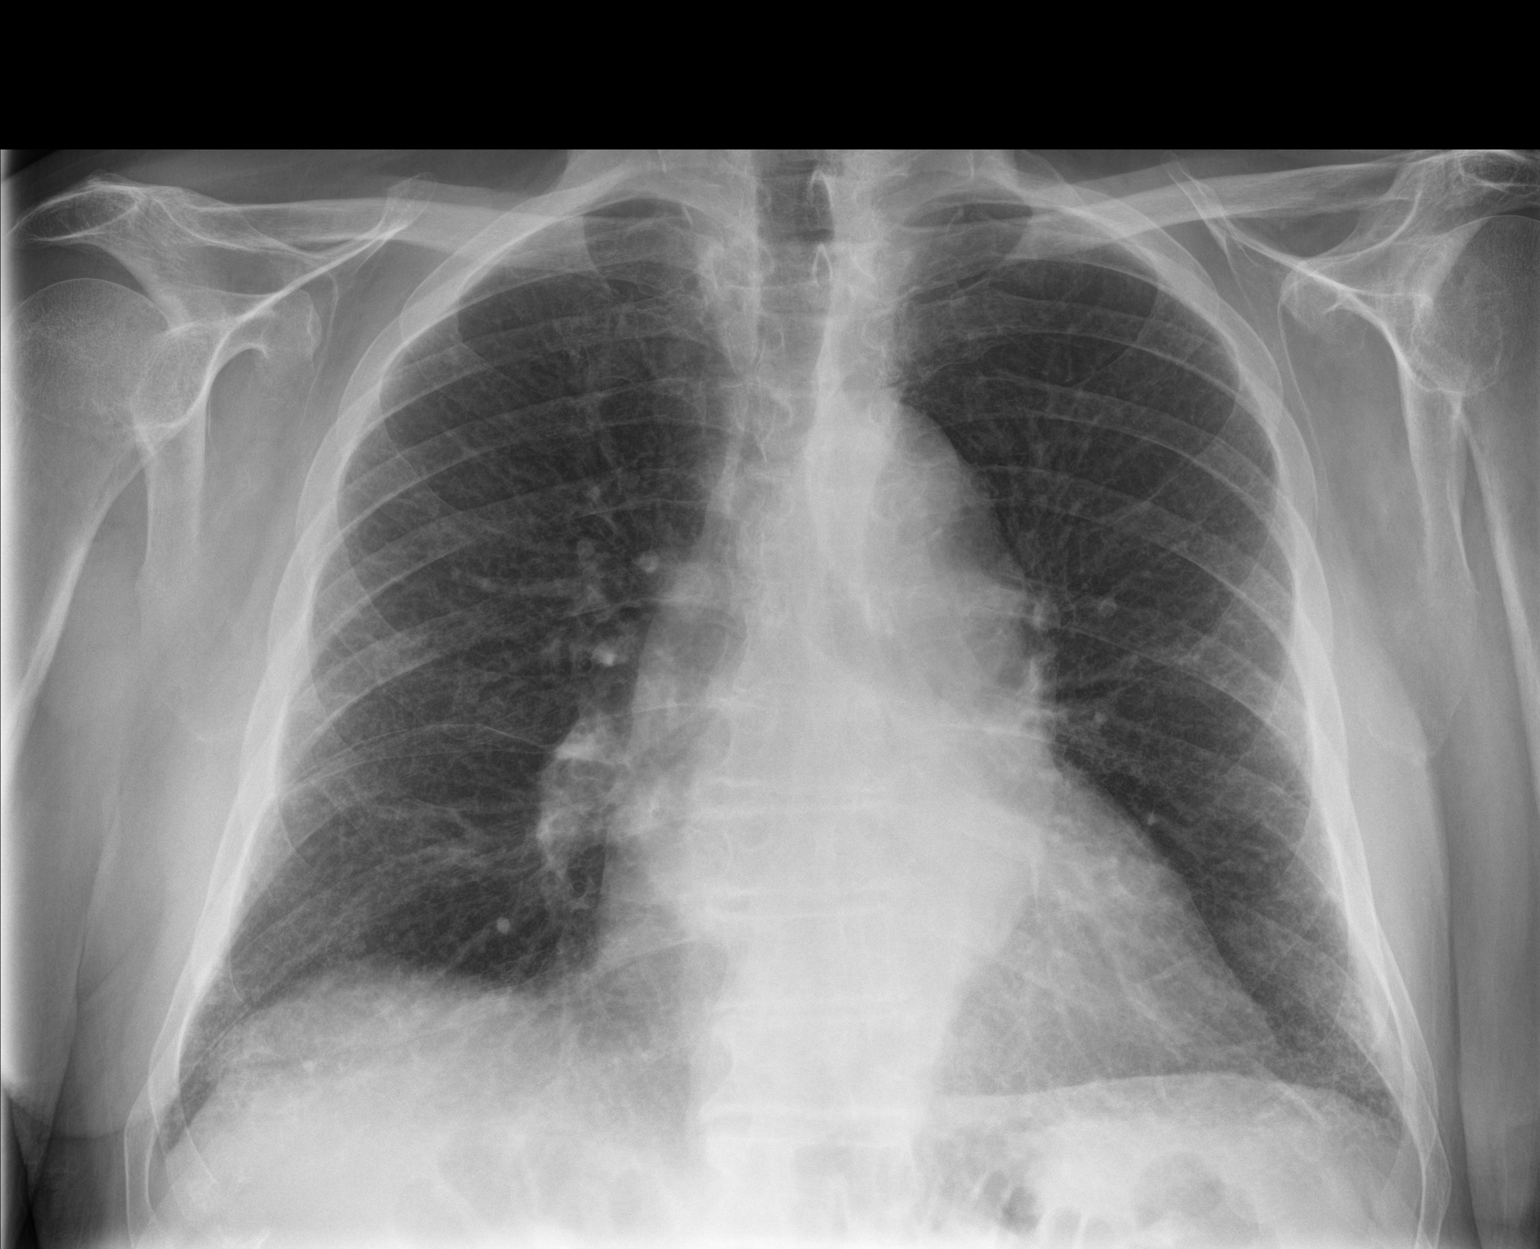

[chest lat]
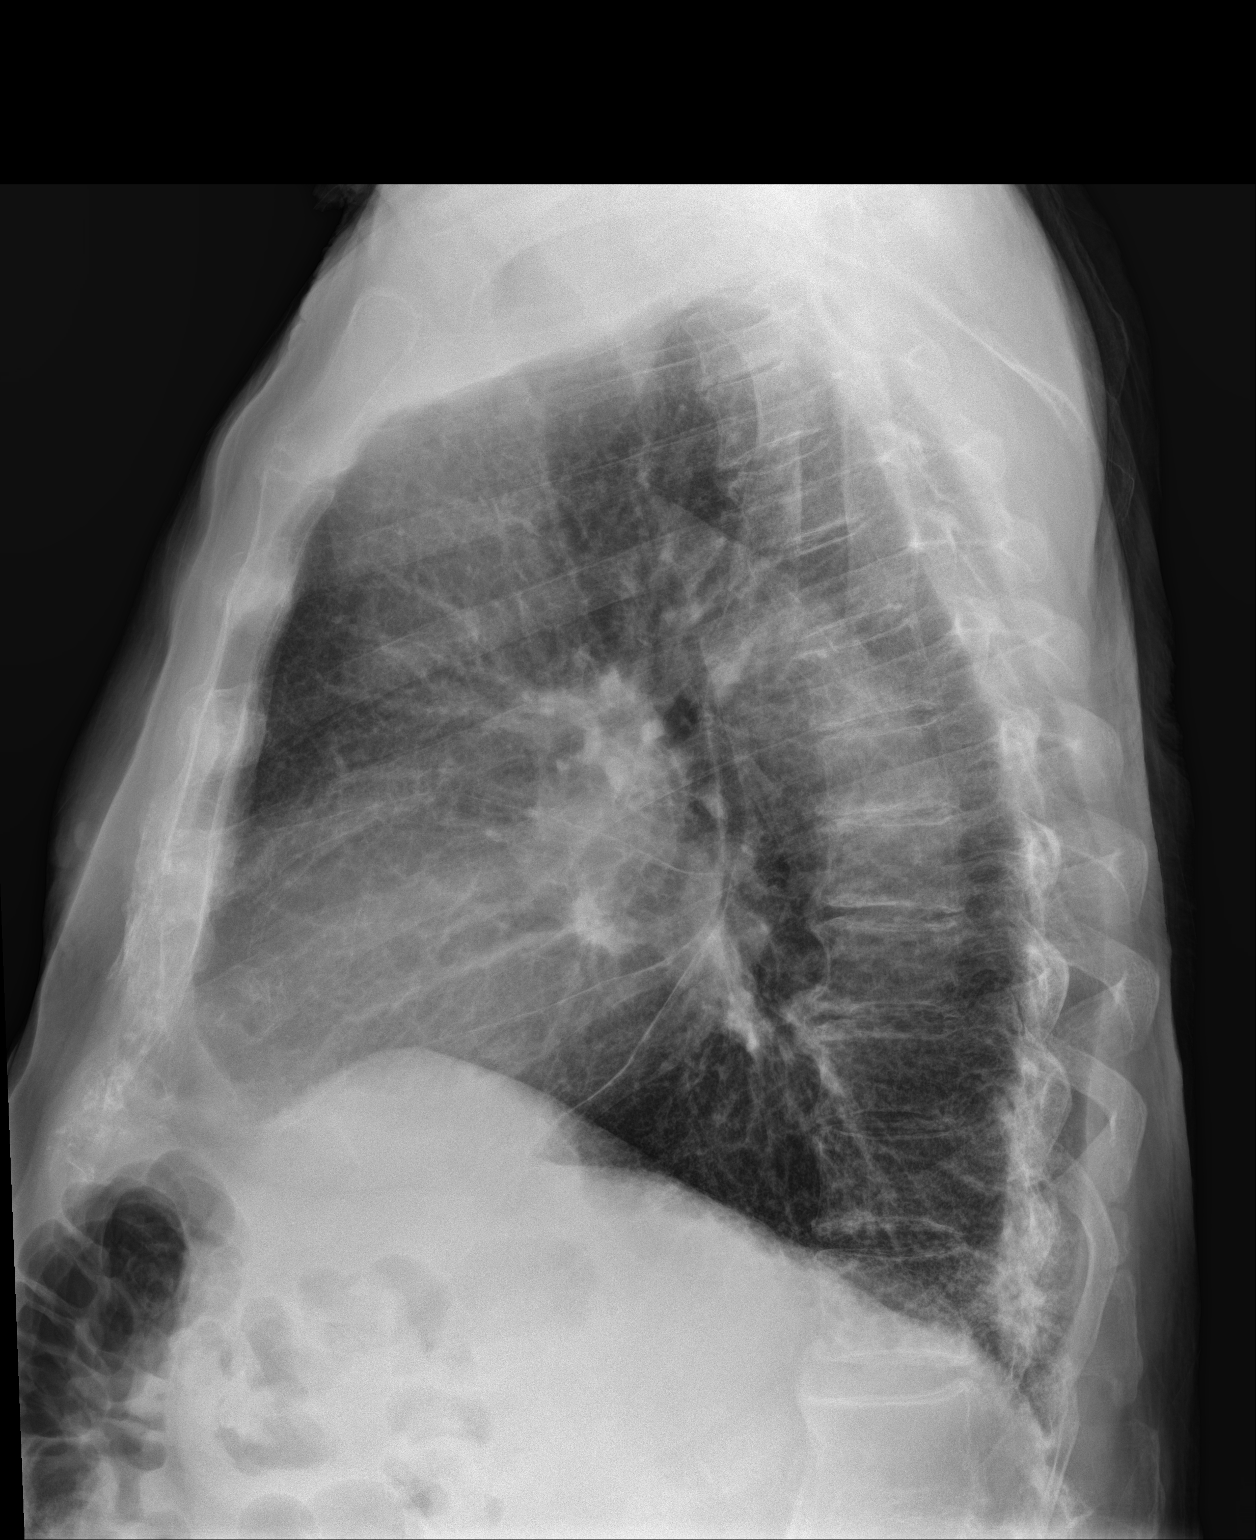

[2 of 2 positions shown; findings below may reference images not displayed]

FINDINGS: The heart size and mediastinal contours are within normal limits.
Normal pulmonary vascularity. Unchanged mildly increased peripheral
and basilar predominant interstitial markings. No focal
consolidation, pleural effusion, or pneumothorax. No acute osseous
abnormality.
IMPRESSION: 1. No acute cardiopulmonary disease.
2. Unchanged chronic interstitial lung disease.

## 2023-05-23 IMAGING — CT CT CHEST HIGH RESOLUTION
2 of 7 series · 14 of 36 positions shown, 17 images · non-contrast
Comparison: CT chest angiogram, 02/27/2019, CT chest, 03/05/2018

CLINICAL DATA: Chest pain, shortness of breath, suspect mild
postinflammatory fibrosis



[Series 5: high resolution retro · axial · 0.80mm/px · z∈[-522,-273]mm · 11 of 299 slices shown, 14 images]
[im 25/299  mediastinal]
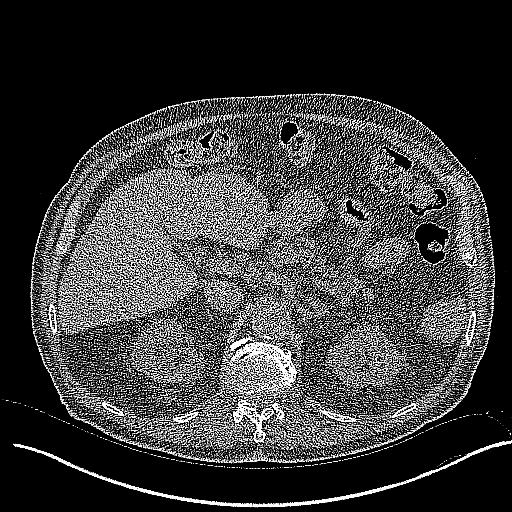
[im 25/299  lung]
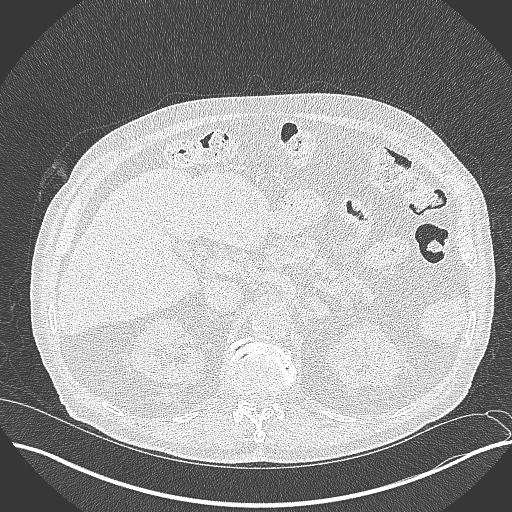
[im 50/299  lung]
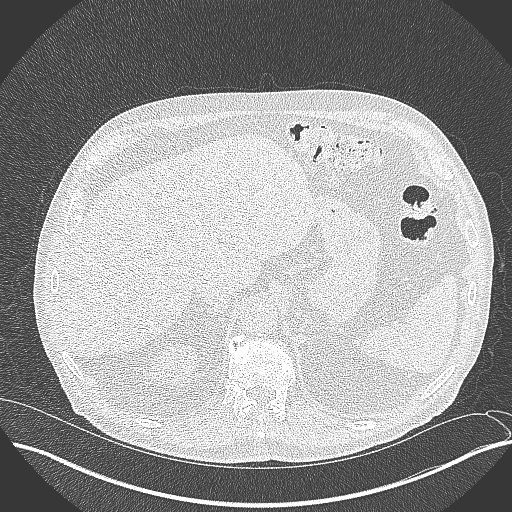
[im 75/299  lung]
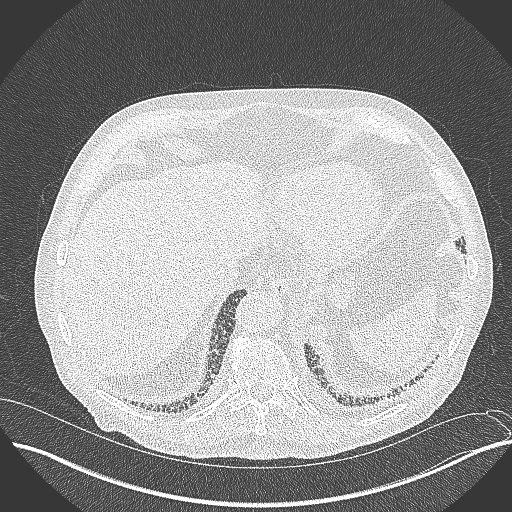
[im 100/299  lung]
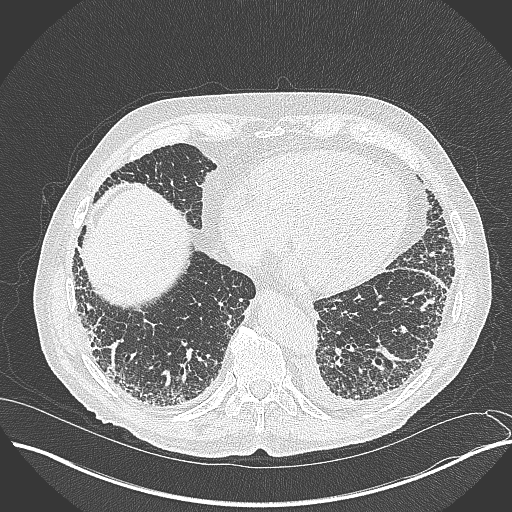
[im 125/299  mediastinal]
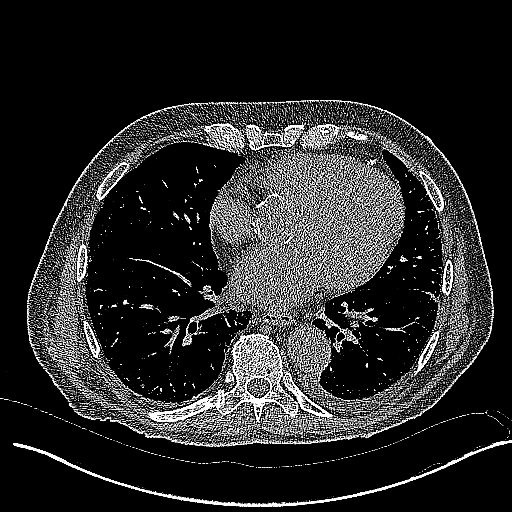
[im 125/299  lung]
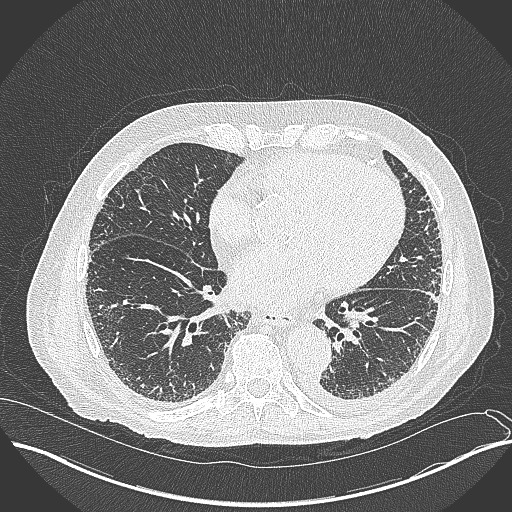
[im 150/299  lung]
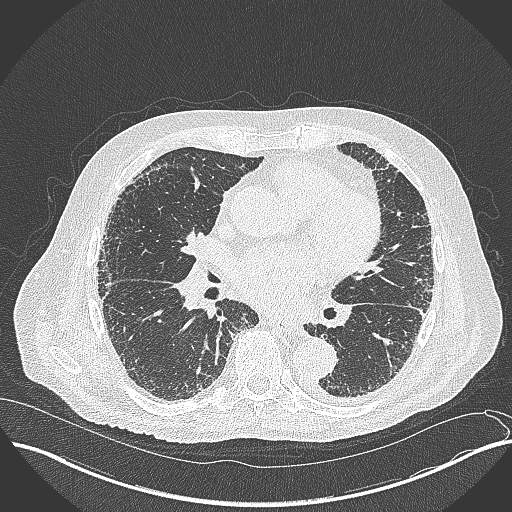
[im 174/299  lung]
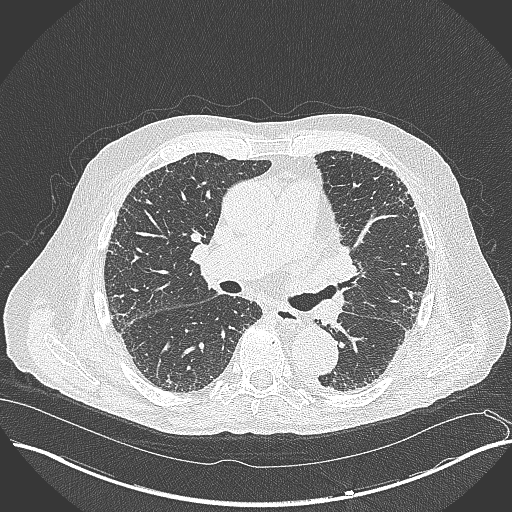
[im 199/299  lung]
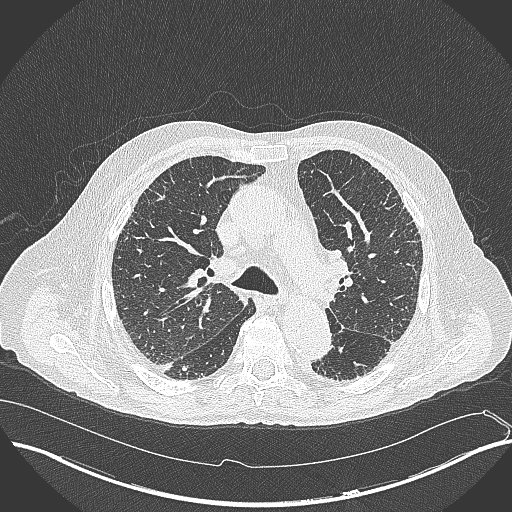
[im 224/299  mediastinal]
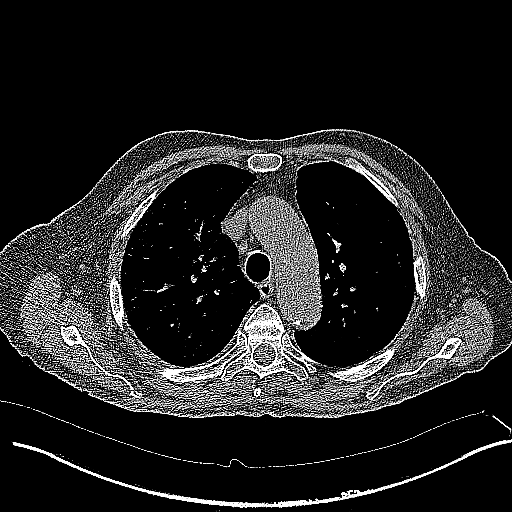
[im 224/299  lung]
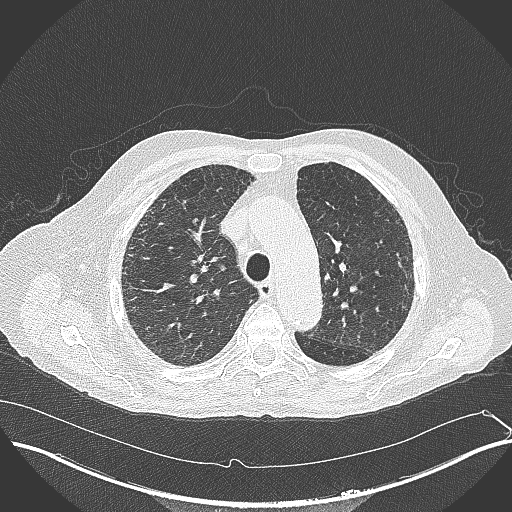
[im 249/299  lung]
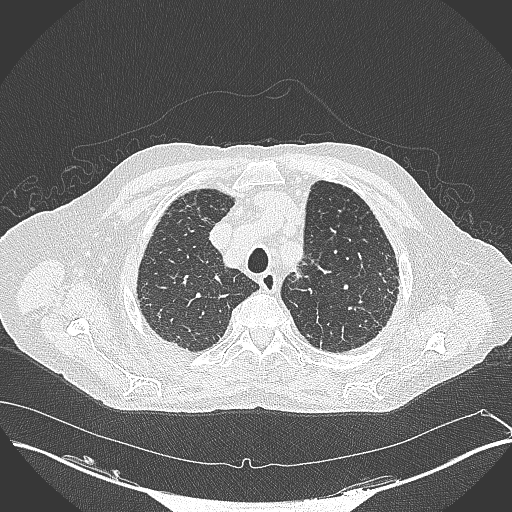
[im 274/299  lung]
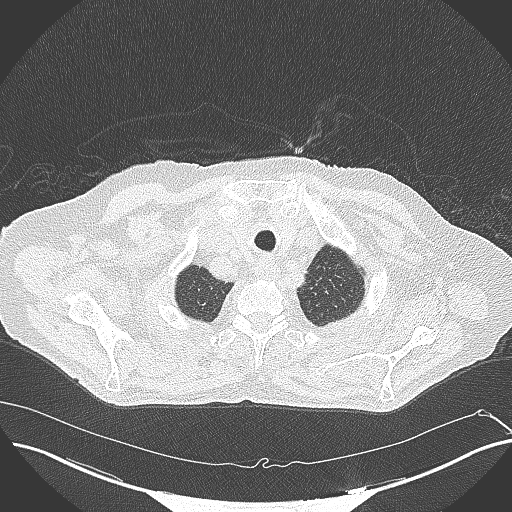

[Series 8: coronal · coronal · 0.62mm/px · 3 of 101 slices shown]
[im 21/101  lung]
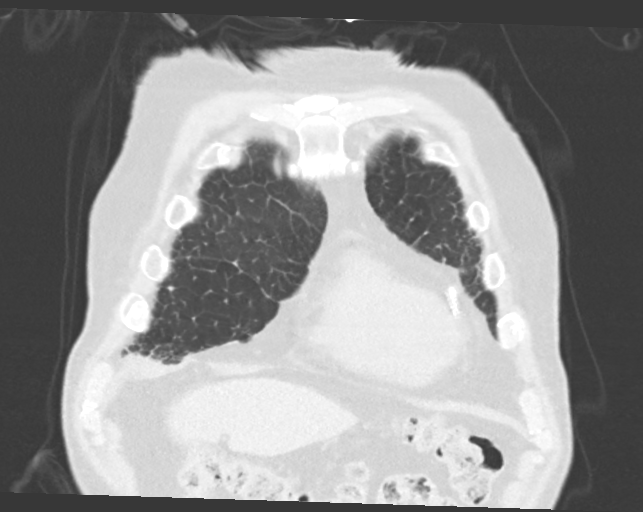
[im 41/101  lung]
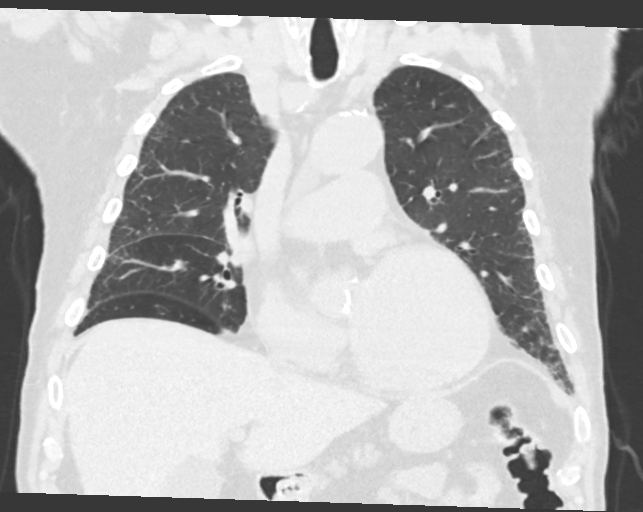
[im 61/101  lung]
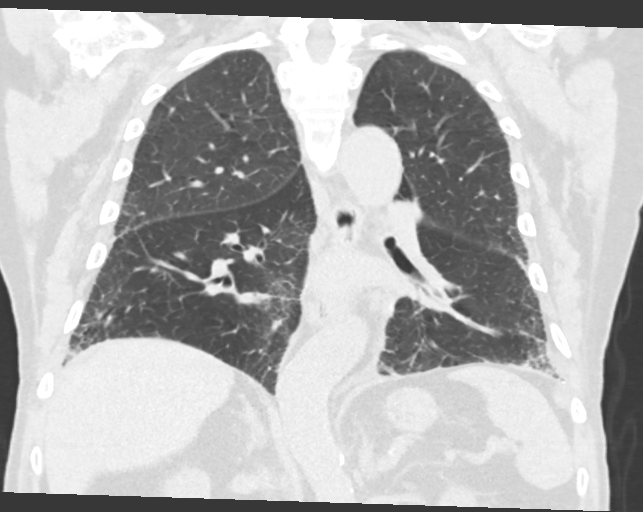

[14 of 36 positions shown; findings below may reference images not displayed]

FINDINGS: Cardiovascular: Aortic atherosclerosis. Aortic valve calcifications.
Cardiomegaly. Three-vessel coronary artery calcifications. No
pericardial effusion.

Mediastinum/Nodes: No enlarged mediastinal, hilar, or axillary lymph
nodes. Thyroid gland, trachea, and esophagus demonstrate no
significant findings.

Lungs/Pleura: Trace bilateral pleural effusions. There is mild to
moderate pulmonary fibrosis in a pattern with apical to basal
gradient, featuring irregular peripheral interstitial opacity,
septal thickening, and subpleural bronchiolectasis at the lung basis
without clear evidence of honeycombing. Fibrotic findings are
slightly worsened in comparison to prior examinations dating back to
03/05/2018. No significant air trapping on expiratory phase imaging.

Upper Abdomen: No acute abnormality.

Musculoskeletal: No chest wall abnormality. No suspicious osseous
lesions identified.
IMPRESSION: 1. There is mild to moderate pulmonary fibrosis in a pattern with
apical to basal gradient, featuring irregular peripheral
interstitial opacity, septal thickening, and subpleural
bronchiolectasis at the lung bases without clear evidence of
honeycombing. Fibrotic findings are slightly worsened in comparison
to prior examinations dating back to 03/05/2018. Findings are
categorized as probable UIP per consensus guidelines, progression
over time strongly favoring UIP: Diagnosis of Idiopathic Pulmonary
Fibrosis: An Official ATS/ERS/JRS/ALAT Clinical Practice Guideline.
Am J Respir Crit Care Med Vol 198, [place] 5, [id number][DATE].
2. Trace bilateral pleural effusions.
3. Coronary artery disease.
4. Aortic valve calcifications. Correlate for echocardiographic
evidence of aortic valve dysfunction.

Aortic Atherosclerosis ([id number]).
